# Patient Record
Sex: Female | Born: 1975 | Race: White | Hispanic: Yes | Marital: Single | Smoking: Former smoker
Health system: Southern US, Community
[De-identification: ages and names within clinical notes are randomized; demographics above are authoritative.]

## PROBLEM LIST (undated history)

## (undated) DIAGNOSIS — D649 Anemia, unspecified: Secondary | ICD-10-CM

## (undated) DIAGNOSIS — F32A Depression, unspecified: Secondary | ICD-10-CM

## (undated) HISTORY — DX: Depression, unspecified: F32.A

## (undated) HISTORY — PX: ABDOMINAL HYSTERECTOMY: SHX81

## (undated) HISTORY — DX: Anemia, unspecified: D64.9

## (undated) HISTORY — PX: TONSILLECTOMY: SUR1361

---

## 2002-12-12 ENCOUNTER — Inpatient Hospital Stay (HOSPITAL_COMMUNITY): Admission: AD | Admit: 2002-12-12 | Discharge: 2002-12-12 | Payer: Self-pay | Admitting: Obstetrics & Gynecology

## 2002-12-27 ENCOUNTER — Encounter: Admission: RE | Admit: 2002-12-27 | Discharge: 2002-12-27 | Payer: Self-pay | Admitting: *Deleted

## 2003-01-13 ENCOUNTER — Ambulatory Visit (HOSPITAL_COMMUNITY): Admission: RE | Admit: 2003-01-13 | Discharge: 2003-01-13 | Payer: Self-pay | Admitting: *Deleted

## 2003-04-15 ENCOUNTER — Inpatient Hospital Stay (HOSPITAL_COMMUNITY): Admission: AD | Admit: 2003-04-15 | Discharge: 2003-04-15 | Payer: Self-pay | Admitting: Obstetrics and Gynecology

## 2003-04-20 ENCOUNTER — Encounter: Admission: RE | Admit: 2003-04-20 | Discharge: 2003-04-20 | Payer: Self-pay | Admitting: *Deleted

## 2003-04-26 ENCOUNTER — Inpatient Hospital Stay (HOSPITAL_COMMUNITY): Admission: AD | Admit: 2003-04-26 | Discharge: 2003-04-28 | Payer: Self-pay | Admitting: *Deleted

## 2003-05-02 ENCOUNTER — Inpatient Hospital Stay (HOSPITAL_COMMUNITY): Admission: AD | Admit: 2003-05-02 | Discharge: 2003-05-02 | Payer: Self-pay | Admitting: Obstetrics and Gynecology

## 2003-11-27 ENCOUNTER — Ambulatory Visit (HOSPITAL_COMMUNITY): Admission: RE | Admit: 2003-11-27 | Discharge: 2003-11-27 | Payer: Self-pay | Admitting: *Deleted

## 2004-02-02 ENCOUNTER — Ambulatory Visit (HOSPITAL_COMMUNITY): Admission: RE | Admit: 2004-02-02 | Discharge: 2004-02-02 | Payer: Self-pay | Admitting: *Deleted

## 2004-06-21 ENCOUNTER — Ambulatory Visit: Payer: Self-pay | Admitting: *Deleted

## 2004-06-21 ENCOUNTER — Ambulatory Visit (HOSPITAL_COMMUNITY): Admission: RE | Admit: 2004-06-21 | Discharge: 2004-06-21 | Payer: Self-pay | Admitting: *Deleted

## 2004-06-26 ENCOUNTER — Ambulatory Visit: Payer: Self-pay | Admitting: *Deleted

## 2004-06-26 ENCOUNTER — Inpatient Hospital Stay (HOSPITAL_COMMUNITY): Admission: AD | Admit: 2004-06-26 | Discharge: 2004-06-28 | Payer: Self-pay | Admitting: *Deleted

## 2006-03-05 ENCOUNTER — Emergency Department (HOSPITAL_COMMUNITY): Admission: EM | Admit: 2006-03-05 | Discharge: 2006-03-06 | Payer: Self-pay | Admitting: Emergency Medicine

## 2006-06-27 ENCOUNTER — Emergency Department (HOSPITAL_COMMUNITY): Admission: EM | Admit: 2006-06-27 | Discharge: 2006-06-27 | Payer: Self-pay | Admitting: Family Medicine

## 2007-05-31 ENCOUNTER — Emergency Department (HOSPITAL_COMMUNITY): Admission: EM | Admit: 2007-05-31 | Discharge: 2007-05-31 | Payer: Self-pay | Admitting: Emergency Medicine

## 2007-07-23 ENCOUNTER — Emergency Department (HOSPITAL_COMMUNITY): Admission: EM | Admit: 2007-07-23 | Discharge: 2007-07-24 | Payer: Self-pay | Admitting: Emergency Medicine

## 2007-08-22 ENCOUNTER — Inpatient Hospital Stay (HOSPITAL_COMMUNITY): Admission: AD | Admit: 2007-08-22 | Discharge: 2007-08-22 | Payer: Self-pay | Admitting: Gynecology

## 2008-02-06 ENCOUNTER — Emergency Department (HOSPITAL_COMMUNITY): Admission: EM | Admit: 2008-02-06 | Discharge: 2008-02-06 | Payer: Self-pay | Admitting: Emergency Medicine

## 2008-05-26 ENCOUNTER — Emergency Department (HOSPITAL_BASED_OUTPATIENT_CLINIC_OR_DEPARTMENT_OTHER): Admission: EM | Admit: 2008-05-26 | Discharge: 2008-05-26 | Payer: Self-pay | Admitting: Emergency Medicine

## 2008-05-26 ENCOUNTER — Ambulatory Visit: Payer: Self-pay | Admitting: Diagnostic Radiology

## 2008-08-27 ENCOUNTER — Ambulatory Visit: Payer: Self-pay | Admitting: Advanced Practice Midwife

## 2008-08-27 ENCOUNTER — Inpatient Hospital Stay (HOSPITAL_COMMUNITY): Admission: AD | Admit: 2008-08-27 | Discharge: 2008-08-28 | Payer: Self-pay | Admitting: Obstetrics & Gynecology

## 2009-02-03 ENCOUNTER — Inpatient Hospital Stay (HOSPITAL_COMMUNITY): Admission: AD | Admit: 2009-02-03 | Discharge: 2009-02-03 | Payer: Self-pay | Admitting: Obstetrics & Gynecology

## 2009-02-03 ENCOUNTER — Ambulatory Visit: Payer: Self-pay | Admitting: Advanced Practice Midwife

## 2010-02-17 ENCOUNTER — Encounter: Payer: Self-pay | Admitting: *Deleted

## 2010-04-07 ENCOUNTER — Emergency Department (HOSPITAL_COMMUNITY)
Admission: EM | Admit: 2010-04-07 | Discharge: 2010-04-07 | Disposition: A | Discharge status: Home / Self Care | Payer: 59 | Attending: Emergency Medicine | Admitting: Emergency Medicine

## 2010-04-07 DIAGNOSIS — M546 Pain in thoracic spine: Secondary | ICD-10-CM | POA: Insufficient documentation

## 2010-04-07 DIAGNOSIS — Y929 Unspecified place or not applicable: Secondary | ICD-10-CM | POA: Insufficient documentation

## 2010-04-07 DIAGNOSIS — M542 Cervicalgia: Secondary | ICD-10-CM | POA: Insufficient documentation

## 2010-04-10 ENCOUNTER — Ambulatory Visit
Admission: RE | Admit: 2010-04-10 | Discharge: 2010-04-10 | Disposition: A | Discharge status: Home / Self Care | Payer: 59 | Source: Ambulatory Visit | Attending: Specialist | Admitting: Specialist

## 2010-04-10 ENCOUNTER — Other Ambulatory Visit: Payer: Self-pay | Admitting: Specialist

## 2010-04-14 LAB — GLUCOSE, CAPILLARY: Glucose-Capillary: 82 mg/dL (ref 70–99)

## 2010-05-04 LAB — URINE MICROSCOPIC-ADD ON

## 2010-05-04 LAB — URINALYSIS, ROUTINE W REFLEX MICROSCOPIC
Bilirubin Urine: NEGATIVE
Glucose, UA: NEGATIVE mg/dL
Hgb urine dipstick: NEGATIVE
Ketones, ur: NEGATIVE mg/dL
Nitrite: NEGATIVE
Protein, ur: NEGATIVE mg/dL
Specific Gravity, Urine: 1.02 (ref 1.005–1.030)
Urobilinogen, UA: 0.2 mg/dL (ref 0.0–1.0)
pH: 5 (ref 5.0–8.0)

## 2010-05-04 LAB — POCT PREGNANCY, URINE: Preg Test, Ur: POSITIVE

## 2010-05-04 LAB — WET PREP, GENITAL
Trich, Wet Prep: NONE SEEN
Yeast Wet Prep HPF POC: NONE SEEN

## 2010-05-04 LAB — GC/CHLAMYDIA PROBE AMP, GENITAL
Chlamydia, DNA Probe: NEGATIVE
GC Probe Amp, Genital: NEGATIVE

## 2010-05-04 LAB — HCG, QUANTITATIVE, PREGNANCY: hCG, Beta Chain, Quant, S: 21830 m[IU]/mL — ABNORMAL HIGH (ref <5)

## 2010-05-08 LAB — CBC
HCT: 33.1 % — ABNORMAL LOW (ref 36.0–46.0)
Hemoglobin: 11.1 g/dL — ABNORMAL LOW (ref 12.0–15.0)
MCHC: 33.5 g/dL (ref 30.0–36.0)
MCV: 79.2 fL (ref 78.0–100.0)
Platelets: 191 10*3/uL (ref 150–400)
RBC: 4.18 MIL/uL (ref 3.87–5.11)
RDW: 15 % (ref 11.5–15.5)
WBC: 6.8 10*3/uL (ref 4.0–10.5)

## 2010-05-08 LAB — BASIC METABOLIC PANEL
BUN: 13 mg/dL (ref 6–23)
CO2: 26 mEq/L (ref 19–32)
Calcium: 8.6 mg/dL (ref 8.4–10.5)
Chloride: 103 mEq/L (ref 96–112)
Creatinine, Ser: 0.7 mg/dL (ref 0.4–1.2)
GFR calc Af Amer: >60 mL/min (ref >60)
GFR calc non Af Amer: >60 mL/min (ref >60)
Glucose, Bld: 87 mg/dL (ref 70–99)
Potassium: 3.9 mEq/L (ref 3.5–5.1)
Sodium: 137 mEq/L (ref 135–145)

## 2010-05-08 LAB — URINALYSIS, ROUTINE W REFLEX MICROSCOPIC
Bilirubin Urine: NEGATIVE
Glucose, UA: NEGATIVE mg/dL
Hgb urine dipstick: NEGATIVE
Ketones, ur: NEGATIVE mg/dL
Nitrite: NEGATIVE
Protein, ur: NEGATIVE mg/dL
Specific Gravity, Urine: 1.014 (ref 1.005–1.030)
Urobilinogen, UA: 0.2 mg/dL (ref 0.0–1.0)
pH: 6.5 (ref 5.0–8.0)

## 2010-05-08 LAB — URINE MICROSCOPIC-ADD ON

## 2010-05-08 LAB — DIFFERENTIAL
Basophils Absolute: 0 10*3/uL (ref 0.0–0.1)
Basophils Relative: 0 % (ref 0–1)
Eosinophils Absolute: 0 10*3/uL (ref 0.0–0.7)
Eosinophils Relative: 1 % (ref 0–5)
Neutrophils Relative %: 75 % (ref 43–77)

## 2010-05-08 LAB — PREGNANCY, URINE: Preg Test, Ur: NEGATIVE

## 2010-05-13 LAB — GLUCOSE, CAPILLARY: Glucose-Capillary: 81 mg/dL (ref 70–99)

## 2010-10-24 LAB — WET PREP, GENITAL
Trich, Wet Prep: NONE SEEN
Yeast Wet Prep HPF POC: NONE SEEN

## 2010-10-24 LAB — URINALYSIS, ROUTINE W REFLEX MICROSCOPIC
Bilirubin Urine: NEGATIVE
Hgb urine dipstick: NEGATIVE
Ketones, ur: NEGATIVE
Nitrite: NEGATIVE
Protein, ur: NEGATIVE
Urobilinogen, UA: 1

## 2010-10-24 LAB — GC/CHLAMYDIA PROBE AMP, GENITAL: GC Probe Amp, Genital: NEGATIVE

## 2010-10-24 LAB — URINE MICROSCOPIC-ADD ON

## 2010-10-25 LAB — URINALYSIS, ROUTINE W REFLEX MICROSCOPIC
Glucose, UA: NEGATIVE
Hgb urine dipstick: NEGATIVE
Ketones, ur: NEGATIVE
pH: 5

## 2010-10-25 LAB — WET PREP, GENITAL
Clue Cells Wet Prep HPF POC: NONE SEEN
Trich, Wet Prep: NONE SEEN
Yeast Wet Prep HPF POC: NONE SEEN

## 2016-12-11 ENCOUNTER — Other Ambulatory Visit: Payer: Self-pay | Admitting: Family Medicine

## 2016-12-11 DIAGNOSIS — Z1231 Encounter for screening mammogram for malignant neoplasm of breast: Secondary | ICD-10-CM

## 2017-02-12 ENCOUNTER — Ambulatory Visit
Admission: RE | Admit: 2017-02-12 | Discharge: 2017-02-12 | Disposition: A | Discharge status: Home / Self Care | Payer: Medicaid Other | Source: Ambulatory Visit | Attending: Family Medicine | Admitting: Family Medicine

## 2017-02-12 DIAGNOSIS — Z1231 Encounter for screening mammogram for malignant neoplasm of breast: Secondary | ICD-10-CM | POA: Insufficient documentation

## 2019-10-12 ENCOUNTER — Other Ambulatory Visit: Payer: Self-pay | Admitting: Family Medicine

## 2019-10-12 DIAGNOSIS — Z1231 Encounter for screening mammogram for malignant neoplasm of breast: Secondary | ICD-10-CM

## 2019-10-28 ENCOUNTER — Other Ambulatory Visit: Payer: Self-pay

## 2019-10-28 DIAGNOSIS — K644 Residual hemorrhoidal skin tags: Secondary | ICD-10-CM | POA: Insufficient documentation

## 2019-10-28 DIAGNOSIS — K625 Hemorrhage of anus and rectum: Secondary | ICD-10-CM | POA: Insufficient documentation

## 2019-10-28 DIAGNOSIS — D508 Other iron deficiency anemias: Secondary | ICD-10-CM | POA: Insufficient documentation

## 2019-10-28 LAB — CBC
HCT: 28.1 % — ABNORMAL LOW (ref 36.0–46.0)
Hemoglobin: 9 g/dL — ABNORMAL LOW (ref 12.0–15.0)
MCH: 21.4 pg — ABNORMAL LOW (ref 26.0–34.0)
MCHC: 32 g/dL (ref 30.0–36.0)
MCV: 66.7 fL — ABNORMAL LOW (ref 80.0–100.0)
Platelets: 405 10*3/uL — ABNORMAL HIGH (ref 150–400)
RBC: 4.21 MIL/uL (ref 3.87–5.11)
RDW: 21.2 % — ABNORMAL HIGH (ref 11.5–15.5)
WBC: 8.4 10*3/uL (ref 4.0–10.5)
nRBC: 0 % (ref 0.0–0.2)

## 2019-10-28 LAB — COMPREHENSIVE METABOLIC PANEL
ALT: 12 U/L (ref 0–44)
AST: 16 U/L (ref 15–41)
Albumin: 3.8 g/dL (ref 3.5–5.0)
Alkaline Phosphatase: 60 U/L (ref 38–126)
Anion gap: 7 (ref 5–15)
BUN: 16 mg/dL (ref 6–20)
CO2: 26 mmol/L (ref 22–32)
Calcium: 9.1 mg/dL (ref 8.9–10.3)
Chloride: 106 mmol/L (ref 98–111)
Creatinine, Ser: 0.75 mg/dL (ref 0.44–1.00)
GFR calc Af Amer: >60 mL/min (ref >60)
GFR calc non Af Amer: >60 mL/min (ref >60)
Glucose, Bld: 102 mg/dL — ABNORMAL HIGH (ref 70–99)
Potassium: 4.1 mmol/L (ref 3.5–5.1)
Sodium: 139 mmol/L (ref 135–145)
Total Bilirubin: 0.4 mg/dL (ref 0.3–1.2)
Total Protein: 7.5 g/dL (ref 6.5–8.1)

## 2019-10-28 MED ORDER — IOHEXOL 9 MG/ML PO SOLN: 500.0000 mL | ORAL | Status: AC

## 2019-10-28 NOTE — ED Triage Notes (Signed)
Pt states bright red blood when she wipes on tissue after bowel movement. Pt states she also is having rectal pain, pain "so intense it hurts to sit". Pt is not sure if she has hemmorrhoid history. Pt is currently on unknown antibiotic for URI. Pt denies dark tarry stools or noted blood in stool.

## 2019-10-28 NOTE — ED Notes (Signed)
Positive antibody in blood  Probably delay in blood

## 2019-10-29 ENCOUNTER — Emergency Department
Admission: EM | Admit: 2019-10-29 | Discharge: 2019-10-29 | Disposition: A | Discharge status: Home / Self Care | Payer: Medicaid Other | Attending: Emergency Medicine | Admitting: Emergency Medicine

## 2019-10-29 DIAGNOSIS — K625 Hemorrhage of anus and rectum: Secondary | ICD-10-CM

## 2019-10-29 DIAGNOSIS — D508 Other iron deficiency anemias: Secondary | ICD-10-CM

## 2019-10-29 DIAGNOSIS — K644 Residual hemorrhoidal skin tags: Secondary | ICD-10-CM

## 2019-10-29 LAB — BPAM RBC
Blood Product Expiration Date: 202110142359
Blood Product Expiration Date: 202110202359
Unit Type and Rh: 5100
Unit Type and Rh: 5100

## 2019-10-29 LAB — TYPE AND SCREEN
ABO/RH(D): B POS
Antibody Screen: POSITIVE
Unit division: 0
Unit division: 0

## 2019-10-29 LAB — ABO/RH: ABO/RH(D): B POS

## 2019-10-29 MED ORDER — NITROGLYCERIN 2 % TD OINT: 0.5000 [in_us] | TOPICAL_OINTMENT | Freq: Four times a day (QID) | TRANSDERMAL | 3 refills | Status: DC

## 2019-10-29 NOTE — ED Provider Notes (Signed)
[facility] Emergency Department Provider Note ____________________________________________   None    (approximate)  I have reviewed the triage vital signs and the nursing notes.  HISTORY  Chief Complaint Rectal Bleeding   HPI Danielle [name] is a 44 y.o. femalewho presents to the ED for evaluation of rectal bleeding.   Chart review indicates patient had an outpatient CT scan of abdomen/pelvis with IV contrast performed 3 days ago due to chronic epigastric pain.  Hepatobiliary tree was unremarkable, diverticulosis without diverticulitis present.  CT without evidence of acute intra-abdominal pathology. Patient self-reports a history of iron deficiency anemia, previously prescribed iron supplementation.  She reports not seeing her PCP for multiple years, due to the COVID-19 pandemic.  She reports recently restarting iron supplementation in the past 2 weeks.   Patient presents to the ED with 5 days of rectal pain and bright red blood when wiping her bottom with toilet paper.  Patient reports 8/10 intensity, constant and aching/sharp pain to her anus that has been present constantly for the past 5 days, this pain is exacerbated/worsened when seated or passing a bowel movement.  She reports taken Tylenol at home without improvement of her pain.  She denies history of hemorrhoids.  Denies constipation, but reports hesitance to void or pass stool due to the pain that she feels when seated.  She denies any worsening of her chronic frontal abdominal pain that is been present for multiple months.  Denies dysuria, fevers, syncope, chest pain, shortness of breath.  She does report feeling presyncopal dizziness while standing that self resolves without syncope.  Denies vaginal bleeding.  Denies hematemesis, melena, coffee-ground emesis or hematochezia.  She reports her stool is brown in color without blood, and she only sees blood when she wipes her bottom with toilet paper.     No past medical history on file.  There are no problems to display for this patient.   No past surgical history on file.  Prior to Admission medications   Medication Sig Start Date End Date Taking? Authorizing Provider  nitroGLYCERIN (NITROGLYN) 2 % ointment Apply 0.5 inches topically every 6 (six) hours. 10/29/19 10/28/20  Delton Prairie, MD    Allergies Patient has no known allergies.  Family History  Problem Relation Age of Onset  . Breast cancer Paternal Aunt     Social History Social History   Tobacco Use  . Smoking status: Not on file  Substance Use Topics  . Alcohol use: Not on file  . Drug use: Not on file    Review of Systems  Constitutional: No fever/chills Eyes: No visual changes. ENT: No sore throat. Cardiovascular: Denies chest pain. Respiratory: Denies shortness of breath. Gastrointestinal: No abdominal pain.  No nausea, no vomiting.  No diarrhea.  No constipation. Positive for rectal pain and bleeding. Genitourinary: Negative for dysuria. Musculoskeletal: Negative for back pain. Skin: Negative for rash. Neurological: Negative for headaches, focal weakness or numbness.   ____________________________________________   PHYSICAL EXAM:  VITAL SIGNS: Vitals:   10/28/19 2206 10/29/19 0800  BP: 124/74 122/83  Pulse: 79 70  Resp: 18   Temp: 98.2 F (36.8 C)   SpO2: 99% 99%      Constitutional: Alert and oriented. Well appearing and in no acute distress.  Obese.  Sitting on her right hip.  Pleasant and conversational full sentences.  No distress. Eyes: Conjunctivae are normal. PERRL. EOMI. Head: Atraumatic. Nose: No congestion/rhinnorhea. Mouth/Throat: Mucous membranes are moist.  Oropharynx non-erythematous. Neck: No stridor. No  cervical spine tenderness to palpation. Cardiovascular: Normal rate, regular rhythm. Grossly normal heart sounds.  Good peripheral circulation. Respiratory: Normal respiratory effort.  No retractions. Lungs  CTAB. Gastrointestinal: Soft , nondistended, nontender to palpation. No abdominal bruits. No CVA tenderness.  Frontal abdomen is benign and soft.  No epigastric tenderness to palpation. Chaperoned GU exam demonstrates anteriorly oriented external hemorrhoid without evidence of thrombosis.  No active bleeding.  DRE causes discomfort.  No bright red blood or melena.  No evidence of impaction. Musculoskeletal: No lower extremity tenderness nor edema.  No joint effusions. No signs of acute trauma. Neurologic:  Normal speech and language. No gross focal neurologic deficits are appreciated. No gait instability noted. Skin:  Skin is warm, dry and intact. No rash noted. Psychiatric: Mood and affect are normal. Speech and behavior are normal.  ____________________________________________   LABS (all labs ordered are listed, but only abnormal results are displayed)  Labs Reviewed  COMPREHENSIVE METABOLIC PANEL - Abnormal; Notable for the following components:      Result Value   Glucose, Bld 102 (*)    All other components within normal limits  CBC - Abnormal; Notable for the following components:   Hemoglobin 9.0 (*)    HCT 28.1 (*)    MCV 66.7 (*)    MCH 21.4 (*)    RDW 21.2 (*)    Platelets 405 (*)    All other components within normal limits  POC URINE PREG, ED  TYPE AND SCREEN  ABO/RH   ____________________________________________   MDM / ED COURSE  44 year old woman with history of iron deficiency anemia presents to ED with evidence of an external hemorrhoid, amenable to outpatient management with PCP follow-up.  Normal vital signs on room air.  Exam demonstrates a well-appearing obese patient who prefers to sit on her side due to anal pain while seated on her bottom.  She has a benign abdomen and looks well without distress.  On GU examination, patient has an external hemorrhoid as the likely source of her pain.  She has no melanotic stool, hematemesis, coffee-ground emesis or  evidence of upper or lower GI bleeding beyond the hemorrhoid.  She has no tachycardia or evidence of acute blood loss anemia.  Her blood work demonstrates microcytic anemia with hemoglobin of 9, with no comparison within the past 11 years in our system.  She has no signs or symptoms of acute blood loss and this is likely chronic in nature, and she certainly has no indications for transfusion.  I strongly recommended her continued iron supplementation and following up with her PCP to discuss hematology referral.  We discussed outpatient management of her external hemorrhoids, to include topical nitroglycerin, topical Preparation H or its generic equivalents, relieving pressure with a donut pillow.  We discussed return precautions for the ED.  I urged PCP follow-up within the next 1 week.  Patient medically stable for discharge home.   ____________________________________________   FINAL CLINICAL IMPRESSION(S) / ED DIAGNOSES  Final diagnoses:  Rectal bleeding  External hemorrhoid, bleeding  Other iron deficiency anemia     ED Discharge Orders         Ordered    nitroGLYCERIN (NITROGLYN) 2 % ointment  Every 6 hours        10/29/19 0808           Delton Prairie   Note:  This document was prepared using Dragon voice recognition software and may include unintentional dictation errors.   Delton Prairie, MD 10/29/19 [phone]

## 2019-10-29 NOTE — ED Notes (Signed)
Patient resting quietly, no acute distress noted. °

## 2019-10-29 NOTE — Discharge Instructions (Addendum)
You were seen in the ED because of your rectal bleeding and pain. You have evidence of external hemorrhoids that are likely causing the bleeding and pain.   You have been discharged with a prescription for nitroglycerin ointment/cream, this is a medication to apply directly to the bump on your bottom that is painful.  The medicine will help reduce swelling and improve your pain.  I would also recommend you pick up over-the-counter Preparation H, or its generic equivalent, either the tube of the medicine or the wipes/pads impregnated with the medication.  Would also recommend that you buy a doughnut pillow to help relieve pressure while seated.   In general, keep the area clean and dry.  If you feel a sensation that you need to wipe your bottom, please gently do so.  Once clean and dry, apply the Preparation H medication.  If you develop any significantly worsening bleeding, passage of clots from your bottom, worsening frontal abdominal pain or fevers, please return to the ED.  Also recommend that you continue to take your iron/blood supplementation medication due to your iron deficiency anemia.  Please follow-up with your primary care physician within the next 1 week to discuss this ED visit and your continued anemia.

## 2019-11-28 ENCOUNTER — Inpatient Hospital Stay: Payer: Medicaid Other

## 2019-11-28 ENCOUNTER — Encounter: Payer: Self-pay | Admitting: Internal Medicine

## 2019-11-28 ENCOUNTER — Encounter (INDEPENDENT_AMBULATORY_CARE_PROVIDER_SITE_OTHER): Payer: Self-pay

## 2019-11-28 ENCOUNTER — Inpatient Hospital Stay: Payer: Medicaid Other | Attending: Internal Medicine | Admitting: Internal Medicine

## 2019-11-28 ENCOUNTER — Other Ambulatory Visit: Payer: Self-pay

## 2019-11-28 DIAGNOSIS — D509 Iron deficiency anemia, unspecified: Secondary | ICD-10-CM | POA: Insufficient documentation

## 2019-11-28 DIAGNOSIS — E611 Iron deficiency: Secondary | ICD-10-CM | POA: Insufficient documentation

## 2019-11-28 DIAGNOSIS — F329 Major depressive disorder, single episode, unspecified: Secondary | ICD-10-CM

## 2019-11-28 DIAGNOSIS — F17210 Nicotine dependence, cigarettes, uncomplicated: Secondary | ICD-10-CM | POA: Insufficient documentation

## 2019-11-28 DIAGNOSIS — D649 Anemia, unspecified: Secondary | ICD-10-CM | POA: Insufficient documentation

## 2019-11-28 LAB — CBC WITH DIFFERENTIAL/PLATELET
Abs Immature Granulocytes: 0.09 10*3/uL — ABNORMAL HIGH (ref 0.00–0.07)
Basophils Absolute: 0 10*3/uL (ref 0.0–0.1)
Basophils Relative: 0 %
Eosinophils Absolute: 0.1 10*3/uL (ref 0.0–0.5)
Eosinophils Relative: 1 %
HCT: 32.5 % — ABNORMAL LOW (ref 36.0–46.0)
Hemoglobin: 10.1 g/dL — ABNORMAL LOW (ref 12.0–15.0)
Immature Granulocytes: 1 %
Lymphocytes Relative: 31 %
Lymphs Abs: 2.2 10*3/uL (ref 0.7–4.0)
MCH: 21.4 pg — ABNORMAL LOW (ref 26.0–34.0)
MCHC: 31.1 g/dL (ref 30.0–36.0)
MCV: 68.7 fL — ABNORMAL LOW (ref 80.0–100.0)
Monocytes Absolute: 0.6 10*3/uL (ref 0.1–1.0)
Monocytes Relative: 8 %
Neutro Abs: 4.1 10*3/uL (ref 1.7–7.7)
Neutrophils Relative %: 59 %
Platelets: 360 10*3/uL (ref 150–400)
RBC: 4.73 MIL/uL (ref 3.87–5.11)
RDW: 20.9 % — ABNORMAL HIGH (ref 11.5–15.5)
WBC: 7.1 10*3/uL (ref 4.0–10.5)
nRBC: 0 % (ref 0.0–0.2)

## 2019-11-28 LAB — COMPREHENSIVE METABOLIC PANEL
ALT: 14 U/L (ref 0–44)
AST: 18 U/L (ref 15–41)
Albumin: 4.2 g/dL (ref 3.5–5.0)
Alkaline Phosphatase: 72 U/L (ref 38–126)
Anion gap: 7 (ref 5–15)
BUN: 11 mg/dL (ref 6–20)
CO2: 25 mmol/L (ref 22–32)
Calcium: 9.3 mg/dL (ref 8.9–10.3)
Chloride: 106 mmol/L (ref 98–111)
Creatinine, Ser: 0.56 mg/dL (ref 0.44–1.00)
GFR, Estimated: >60 mL/min (ref >60)
Glucose, Bld: 104 mg/dL — ABNORMAL HIGH (ref 70–99)
Potassium: 3.8 mmol/L (ref 3.5–5.1)
Sodium: 138 mmol/L (ref 135–145)
Total Bilirubin: 0.5 mg/dL (ref 0.3–1.2)
Total Protein: 8.5 g/dL — ABNORMAL HIGH (ref 6.5–8.1)

## 2019-11-28 LAB — RETICULOCYTES
Immature Retic Fract: 24.9 % — ABNORMAL HIGH (ref 2.3–15.9)
RBC.: 4.73 MIL/uL (ref 3.87–5.11)
Retic Count, Absolute: 61 10*3/uL (ref 19.0–186.0)
Retic Ct Pct: 1.3 % (ref 0.4–3.1)

## 2019-11-28 LAB — IRON AND TIBC
Iron: 25 ug/dL — ABNORMAL LOW (ref 28–170)
Saturation Ratios: 5 % — ABNORMAL LOW (ref 10.4–31.8)
TIBC: 540 ug/dL — ABNORMAL HIGH (ref 250–450)
UIBC: 515 ug/dL

## 2019-11-28 LAB — LACTATE DEHYDROGENASE: LDH: 115 U/L (ref 98–192)

## 2019-11-28 LAB — FERRITIN: Ferritin: 5 ng/mL — ABNORMAL LOW (ref 11–307)

## 2019-11-28 NOTE — Progress Notes (Signed)
[facility] CONSULT NOTE  Patient Care Team: Pcp, No as PCP - General  CHIEF COMPLAINTS/PURPOSE OF CONSULTATION: ANEMIA  HEMATOLOGY HISTORY  # IRON DEFICIENCY ANEMIA CHRONIC [since 2019] AUG 2021- hb-9; MCV- 63; Iron sat- 3%; EGD > 7 years ago [[place]]; colonoscopy/capsule-NA  #History of heavy menstrual cycles; however-LMP-June2021  HISTORY OF PRESENTING ILLNESS:  Danielle [name] 44 y.o.  female has been referred to [place] for further evaluation/work-up for anemia.  Patient complains of worsening fatigue over the last many months.  Complains of myalgias.  Complains of dizzy spells.  Complains of tingling and numbness around her mouth and also in the extremities.  Blood in stools: None Change in bowel habits- None Blood in urine: None Difficulty swallowing: None Abnormal weight loss: None Iron supplementation: on PO iron supplementation.  Prior Blood transfusions: None Vaginal bleeding: History of heavy menstrual cycles however last menstrual cycle- June 2021.  PICA- ice  Review of Systems  Constitutional: Positive for malaise/fatigue. Negative for chills, diaphoresis, fever and weight loss.  HENT: Negative for nosebleeds and sore throat.   Eyes: Negative for double vision.  Respiratory: Negative for cough, hemoptysis, sputum production, shortness of breath and wheezing.   Cardiovascular: Negative for chest pain, palpitations, orthopnea and leg swelling.  Gastrointestinal: Positive for nausea and vomiting. Negative for abdominal pain, blood in stool, constipation, diarrhea, heartburn and melena.  Genitourinary: Negative for dysuria, frequency and urgency.  Musculoskeletal: Positive for myalgias. Negative for back pain and joint pain.  Skin: Negative.  Negative for itching and rash.  Neurological: Positive for dizziness and tingling. Negative for focal weakness, weakness and headaches.  Endo/Heme/Allergies: Does not bruise/bleed easily.  Psychiatric/Behavioral: Negative  for depression. The patient has insomnia. The patient is not nervous/anxious.     MEDICAL HISTORY:  Past Medical History:  Diagnosis Date  . Anemia   . Depression     SURGICAL HISTORY: Past Surgical History:  Procedure Laterality Date  . ABDOMINAL HYSTERECTOMY    . TONSILLECTOMY      SOCIAL HISTORY: Social History   Socioeconomic History  . Marital status: Single    Spouse name: Not on file  . Number of children: Not on file  . Years of education: Not on file  . Highest education level: Not on file  Occupational History  . Not on file  Tobacco Use  . Smoking status: Current Every Day Smoker  . Smokeless tobacco: Never Used  Substance and Sexual Activity  . Alcohol use: Not Currently  . Drug use: Never  . Sexual activity: Not on file  Other Topics Concern  . Not on file  Social History Narrative   Lives in [place] with 4 kids; stay at home [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    Social Determinants of Health   Financial Resource Strain:   . Difficulty of Paying Living Expenses: Not on file  Food Insecurity:   . Worried About Programme researcher, broadcasting/film/video in the Last Year: Not on file  . Ran Out of Food in the Last Year: Not on file  Transportation Needs:   . Lack of Transportation (Medical): Not on file  . Lack of Transportation (Non-Medical): Not on file  Physical Activity:   . Days of Exercise per Week: Not on file  . Minutes of Exercise per Session: Not on file  Stress:   . Feeling of Stress : Not on file  Social Connections:   . Frequency of Communication with Friends and Family: Not on file  .  Frequency of Social Gatherings with Friends and Family: Not on file  . Attends Religious Services: Not on file  . Active Member of Clubs or Organizations: Not on file  . Attends Banker Meetings: Not on file  . Marital Status: Not on file  Intimate Partner Violence:   . Fear of Current or Ex-Partner: Not on file  . Emotionally Abused: Not on  file  . Physically Abused: Not on file  . Sexually Abused: Not on file    FAMILY HISTORY: Family History  Problem Relation Age of Onset  . Breast cancer Paternal Aunt     ALLERGIES:  has No Known Allergies.  MEDICATIONS:  Current Outpatient Medications  Medication Sig Dispense Refill  . Ferrous Sulfate (IRON SUPPLEMENT PO) Take by mouth. megafood blood builder OTC    . ondansetron (ZOFRAN) 4 MG tablet Take 4 mg by mouth every 4 (four) hours as needed.    . pantoprazole (PROTONIX) 40 MG tablet Take 40 mg by mouth daily.    Marland Kitchen PARoxetine (PAXIL) 10 MG tablet Take 10 mg by mouth daily.    . sucralfate (CARAFATE) 1 g tablet Take 1 g by mouth 4 (four) times daily.     No current facility-administered medications for this visit.      PHYSICAL EXAMINATION:   Vitals:   11/28/19 1410  BP: 112/79  Pulse: 76  Resp: 16  Temp: (!) 97 F (36.1 C)  SpO2: 100%   Filed Weights   11/28/19 1410  Weight: 207 lb 12.8 oz (94.3 kg)    Physical Exam HENT:     Head: Normocephalic and atraumatic.     Mouth/Throat:     Pharynx: No oropharyngeal exudate.  Eyes:     Pupils: Pupils are equal, round, and reactive to light.  Cardiovascular:     Rate and Rhythm: Normal rate and regular rhythm.  Pulmonary:     Effort: Pulmonary effort is normal. No respiratory distress.     Breath sounds: Normal breath sounds. No wheezing.  Abdominal:     General: Bowel sounds are normal. There is no distension.     Palpations: Abdomen is soft. There is no mass.     Tenderness: There is no abdominal tenderness. There is no guarding or rebound.  Musculoskeletal:        General: No tenderness. Normal range of motion.     Cervical back: Normal range of motion and neck supple.  Skin:    General: Skin is warm.  Neurological:     Mental Status: She is alert and oriented to person, place, and time.  Psychiatric:        Mood and Affect: Affect normal.     LABORATORY DATA:  I have reviewed the data as  listed Lab Results  Component Value Date   WBC 8.4 10/28/2019   HGB 9.0 (L) 10/28/2019   HCT 28.1 (L) 10/28/2019   MCV 66.7 (L) 10/28/2019   PLT 405 (H) 10/28/2019   Recent Labs    10/28/19 2213  NA 139  K 4.1  CL 106  CO2 26  GLUCOSE 102*  BUN 16  CREATININE 0.75  CALCIUM 9.1  GFRNONAA >60  GFRAA >60  PROT 7.5  ALBUMIN 3.8  AST 16  ALT 12  ALKPHOS 60  BILITOT 0.4     No results found.  Iron deficiency #Symptomatic iron deficient anemia-hemoglobin 9.1 MCV 66.  Iron studies-August 2021 saturation 3% [PCP].   # Discussed the potential acute infusion reactions  with IV iron; which are quite rare.  Patient understands the risk; will proceed with infusions.  #Etiology of iron deficiency-unclear.  Prior history of heavy menstrual cycles however LMP June 2021.  Given ongoing abdominal discomfort nausea patient awaiting GI evaluation.  Patient would benefit from a repeat upper endoscopy/colonoscopy.  #Active smoker: Counseled the patient to quit smoking.  Patient understands potential adverse events related to smoking.  Thank you, Ms.Lindley NP; for allowing me to participate in the care of your pleasant patient. Please do not hesitate to contact me with questions or concerns in the interim.  # DISPOSITION: # labs today-CBC iron studies ferritin LDH haptoglobin. # Venofer weekly x3; start this week # follow up in 1st week of dec; MD; labs- cbc/venofer-Dr.B    All questions were answered. The patient knows to call the clinic with any problems, questions or concerns.      Earna Coder, MD 11/28/2019 3:10 PM

## 2019-11-28 NOTE — Assessment & Plan Note (Addendum)
#  Symptomatic iron deficient anemia-hemoglobin 9.1 MCV 66.  Iron studies-August 2021 saturation 3% [PCP].   # Discussed the potential acute infusion reactions with IV iron; which are quite rare.  Patient understands the risk; will proceed with infusions.  #Etiology of iron deficiency-unclear.  Prior history of heavy menstrual cycles however LMP June 2021.  Given ongoing abdominal discomfort nausea patient awaiting GI evaluation.  Patient would benefit from a repeat upper endoscopy/colonoscopy.  #Active smoker: Counseled the patient to quit smoking.  Patient understands potential adverse events related to smoking.  Thank you, Ms.Lindley NP; for allowing me to participate in the care of your pleasant patient. Please do not hesitate to contact me with questions or concerns in the interim.  # DISPOSITION: # labs today-CBC iron studies ferritin LDH haptoglobin. # Venofer weekly x3; start this week # follow up in 1st week of dec; MD; labs- cbc/venofer-Dr.B  Addendum: on 11/02-left voicemail for the patient regarding low iron levels need for Venofer as discussed earlier in the clinic.  However on 11/03-as per office staff patient insurance out of network.  Patient be contacted regarding future hematology care/referral.

## 2019-11-28 NOTE — Addendum Note (Signed)
Addended by: Mercer Pod E on: 11/28/2019 03:50 PM   Modules accepted: Orders

## 2019-11-28 NOTE — Progress Notes (Signed)
Pt states she has episodes of passing out that has been going on for a little bit over a month. States that it is random. Has severe nausea and vomiting as well that she is following up with GI about.

## 2019-11-29 LAB — HAPTOGLOBIN: Haptoglobin: 190 mg/dL (ref 42–296)

## 2019-12-01 ENCOUNTER — Telehealth: Payer: Self-pay | Admitting: Internal Medicine

## 2019-12-01 NOTE — Telephone Encounter (Signed)
Tried to call patient at [phone] without success.  Was able to reach her daughter Konrad Penta and requested a call back from the patient.  Patient Danielle [name] called back and I explained that she is enrolled in a [place] Medicaid Complete plan that is out of network.  I suggested that she reach out to her case worker to see if she could enroll in any of the plans that are in network [employer], [employer] or [employer] so that the infusions ordered by Dr. Donneta Romberg would be covered.  Patient expressed understanding and is going to reach out to the case worker to see if this is possible.  I left my number as her call back to further discuss.  [facility]

## 2019-12-08 ENCOUNTER — Telehealth: Payer: Self-pay | Admitting: Internal Medicine

## 2019-12-08 ENCOUNTER — Inpatient Hospital Stay: Payer: Medicaid Other

## 2019-12-08 NOTE — Telephone Encounter (Signed)
Spoke with patient and she has not heard back from her case worker.  Canceling venofer appointment for today because patient does not have resources to pay out of network/ out of pocket expenses. She is going to call as soon as she hears from the case worker, I also said I would call her back on the 16th if I have not heard from her.  Team made aware via secure chat.

## 2019-12-15 ENCOUNTER — Inpatient Hospital Stay: Payer: Medicaid Other

## 2019-12-15 ENCOUNTER — Telehealth: Payer: Self-pay

## 2019-12-15 NOTE — Telephone Encounter (Signed)
I spoke with the patient she does not have insurance issues resolved and will not be coming in for appt. today. Message sent to [facility], scheduling and infusion nurse to inform.

## 2019-12-15 NOTE — Telephone Encounter (Signed)
Danielle [name] is reaching out to her manager to see how we can proceed with the insurance issue patient has been having with Medicaid.

## 2019-12-19 ENCOUNTER — Inpatient Hospital Stay: Payer: Medicaid Other

## 2020-01-03 ENCOUNTER — Other Ambulatory Visit: Payer: Medicaid Other

## 2020-01-03 ENCOUNTER — Ambulatory Visit: Payer: Medicaid Other

## 2020-01-03 ENCOUNTER — Ambulatory Visit: Payer: Medicaid Other | Admitting: Internal Medicine

## 2020-01-11 ENCOUNTER — Inpatient Hospital Stay: Payer: Medicaid Other | Admitting: Internal Medicine

## 2020-01-11 ENCOUNTER — Inpatient Hospital Stay: Payer: Medicaid Other

## 2020-01-11 ENCOUNTER — Other Ambulatory Visit: Payer: Self-pay | Admitting: *Deleted

## 2020-01-11 DIAGNOSIS — E611 Iron deficiency: Secondary | ICD-10-CM

## 2020-01-26 ENCOUNTER — Encounter: Payer: Self-pay | Admitting: Emergency Medicine

## 2020-01-26 ENCOUNTER — Other Ambulatory Visit: Payer: Self-pay

## 2020-01-26 DIAGNOSIS — R110 Nausea: Secondary | ICD-10-CM | POA: Insufficient documentation

## 2020-01-26 DIAGNOSIS — R1031 Right lower quadrant pain: Secondary | ICD-10-CM | POA: Diagnosis present

## 2020-01-26 DIAGNOSIS — Z5321 Procedure and treatment not carried out due to patient leaving prior to being seen by health care provider: Secondary | ICD-10-CM | POA: Diagnosis not present

## 2020-01-26 DIAGNOSIS — R1032 Left lower quadrant pain: Secondary | ICD-10-CM | POA: Insufficient documentation

## 2020-01-26 LAB — COMPREHENSIVE METABOLIC PANEL
ALT: 14 U/L (ref 0–44)
AST: 20 U/L (ref 15–41)
Albumin: 3.6 g/dL (ref 3.5–5.0)
Alkaline Phosphatase: 58 U/L (ref 38–126)
Anion gap: 10 (ref 5–15)
BUN: 17 mg/dL (ref 6–20)
CO2: 25 mmol/L (ref 22–32)
Calcium: 8.9 mg/dL (ref 8.9–10.3)
Chloride: 104 mmol/L (ref 98–111)
Creatinine, Ser: 0.71 mg/dL (ref 0.44–1.00)
GFR, Estimated: >60 mL/min (ref >60)
Glucose, Bld: 98 mg/dL (ref 70–99)
Potassium: 4.3 mmol/L (ref 3.5–5.1)
Sodium: 139 mmol/L (ref 135–145)
Total Bilirubin: 0.4 mg/dL (ref 0.3–1.2)
Total Protein: 7.2 g/dL (ref 6.5–8.1)

## 2020-01-26 LAB — CBC
HCT: 29.5 % — ABNORMAL LOW (ref 36.0–46.0)
Hemoglobin: 9.5 g/dL — ABNORMAL LOW (ref 12.0–15.0)
MCH: 22.1 pg — ABNORMAL LOW (ref 26.0–34.0)
MCHC: 32.2 g/dL (ref 30.0–36.0)
MCV: 68.8 fL — ABNORMAL LOW (ref 80.0–100.0)
Platelets: 366 10*3/uL (ref 150–400)
RBC: 4.29 MIL/uL (ref 3.87–5.11)
RDW: 19.6 % — ABNORMAL HIGH (ref 11.5–15.5)
WBC: 6.3 10*3/uL (ref 4.0–10.5)
nRBC: 0 % (ref 0.0–0.2)

## 2020-01-26 LAB — URINALYSIS, COMPLETE (UACMP) WITH MICROSCOPIC
Bilirubin Urine: NEGATIVE
Glucose, UA: NEGATIVE mg/dL
Ketones, ur: NEGATIVE mg/dL
Nitrite: NEGATIVE
Protein, ur: NEGATIVE mg/dL
Specific Gravity, Urine: 1.012 (ref 1.005–1.030)
pH: 6 (ref 5.0–8.0)

## 2020-01-26 LAB — POC URINE PREG, ED: Preg Test, Ur: NEGATIVE

## 2020-01-26 LAB — LIPASE, BLOOD: Lipase: 100 U/L — ABNORMAL HIGH (ref 11–51)

## 2020-01-26 NOTE — ED Triage Notes (Signed)
Pt to ED from home c/o lower right, left, mid abd pain that is sharp started tonight.  Nausea without vomiting, denies diarrhea or urinary changes.  Pt states last period in June, denies being pregnant, chart states hysterectomy but patient describes having tubal ligation done.

## 2020-01-27 ENCOUNTER — Emergency Department
Admission: EM | Admit: 2020-01-27 | Discharge: 2020-01-27 | Disposition: A | Discharge status: Home / Self Care | Payer: Medicaid Other | Attending: Emergency Medicine | Admitting: Emergency Medicine

## 2020-02-21 ENCOUNTER — Other Ambulatory Visit: Payer: Self-pay | Admitting: Adult Health

## 2020-02-21 DIAGNOSIS — Z1231 Encounter for screening mammogram for malignant neoplasm of breast: Secondary | ICD-10-CM

## 2020-07-09 ENCOUNTER — Other Ambulatory Visit: Payer: Self-pay | Admitting: Family Medicine

## 2020-07-09 DIAGNOSIS — Z1231 Encounter for screening mammogram for malignant neoplasm of breast: Secondary | ICD-10-CM

## 2020-10-30 ENCOUNTER — Encounter: Payer: Self-pay | Admitting: Internal Medicine

## 2020-10-30 ENCOUNTER — Other Ambulatory Visit: Payer: Self-pay

## 2020-10-30 ENCOUNTER — Inpatient Hospital Stay: Payer: Medicaid Other | Attending: Internal Medicine | Admitting: Internal Medicine

## 2020-10-30 ENCOUNTER — Inpatient Hospital Stay: Payer: Medicaid Other

## 2020-10-30 DIAGNOSIS — E611 Iron deficiency: Secondary | ICD-10-CM | POA: Diagnosis not present

## 2020-10-30 DIAGNOSIS — D509 Iron deficiency anemia, unspecified: Secondary | ICD-10-CM | POA: Diagnosis present

## 2020-10-30 LAB — CBC WITH DIFFERENTIAL/PLATELET
Abs Immature Granulocytes: 0.02 10*3/uL (ref 0.00–0.07)
Basophils Absolute: 0 10*3/uL (ref 0.0–0.1)
Basophils Relative: 0 %
Eosinophils Absolute: 0 10*3/uL (ref 0.0–0.5)
Eosinophils Relative: 1 %
HCT: 30.3 % — ABNORMAL LOW (ref 36.0–46.0)
Hemoglobin: 9.7 g/dL — ABNORMAL LOW (ref 12.0–15.0)
Immature Granulocytes: 0 %
Lymphocytes Relative: 23 %
Lymphs Abs: 1.4 10*3/uL (ref 0.7–4.0)
MCH: 24.4 pg — ABNORMAL LOW (ref 26.0–34.0)
MCHC: 32 g/dL (ref 30.0–36.0)
MCV: 76.3 fL — ABNORMAL LOW (ref 80.0–100.0)
Monocytes Absolute: 0.6 10*3/uL (ref 0.1–1.0)
Monocytes Relative: 9 %
Neutro Abs: 4 10*3/uL (ref 1.7–7.7)
Neutrophils Relative %: 67 %
Platelets: 264 10*3/uL (ref 150–400)
RBC: 3.97 MIL/uL (ref 3.87–5.11)
RDW: 19 % — ABNORMAL HIGH (ref 11.5–15.5)
WBC: 6 10*3/uL (ref 4.0–10.5)
nRBC: 0 % (ref 0.0–0.2)

## 2020-10-30 NOTE — Assessment & Plan Note (Addendum)
#  Symptomatic iron deficient anemia-hemoglobin 9.1 MCV 66.  Iron studies-August 2021 saturation 3% [PCP].  Today hemoglobin is 9.7 microcytic.  Iron deficiency.  Recommend proceeding with IV iron.  # Discussed the potential acute infusion reactions with IV iron; which are quite rare.  Patient understands the risk; will proceed with infusions.  #Etiology of iron deficiency-likely heavy menstrual cycles.  Given ongoing abdominal discomfort nausea patient awaiting GI evaluation.  Patient would benefit from a repeat upper endoscopy/colonoscopy- awaiting insurance approval.  #Active smoker: Counseled the patient to quit smoking.  Patient understands potential adverse events related to smoking.   # DISPOSITION: # Venofer weekly x4; start this week- ASAP # follow up 8 weeks- NP; labs- cbc; possible venoferDr.B

## 2020-10-30 NOTE — Progress Notes (Signed)
[facility] CONSULT NOTE  Patient Care Team: Armando Gang, FNP as PCP - General (Family Medicine)  CHIEF COMPLAINTS/PURPOSE OF CONSULTATION: ANEMIA  HEMATOLOGY HISTORY  # IRON DEFICIENCY ANEMIA CHRONIC [since 2019] AUG 2021- hb-9; MCV- 63; Iron sat- 3%; EGD > 7 years ago [[place]]; colonoscopy/capsule-NA; PO iron constipates.   #History of heavy menstrual cycles  HISTORY OF PRESENTING ILLNESS: Alone.  Ambulating independently. Danielle [name] 45 y.o.  female with iron deficiency anemia is here for follow-up.  Patient was evaluated approximately a year ago-recommend IV iron.  However, because of insurance issues patient never started on IV iron.   Patient has poor tolerance to oral iron.  Constipation.  Continues to have worsening fatigue.  Myalgias.  Dizzy spells.  Tingling and numbness.  Patient complains of worsening fatigue over the last many months.  Complains of myalgias.  Complains of dizzy spells.  Complains of tingling and numbness around her mouth and also in the extremities.  Heavy menstrual cycles.  Nausea/vomiting intermittent abdominal discomfort.  Awaiting GI evaluation.  Review of Systems  Constitutional:  Positive for malaise/fatigue. Negative for chills, diaphoresis, fever and weight loss.  HENT:  Negative for nosebleeds and sore throat.   Eyes:  Negative for double vision.  Respiratory:  Negative for cough, hemoptysis, sputum production, shortness of breath and wheezing.   Cardiovascular:  Negative for chest pain, palpitations, orthopnea and leg swelling.  Gastrointestinal:  Positive for nausea and vomiting. Negative for abdominal pain, blood in stool, constipation, diarrhea, heartburn and melena.  Genitourinary:  Negative for dysuria, frequency and urgency.  Musculoskeletal:  Positive for myalgias. Negative for back pain and joint pain.  Skin: Negative.  Negative for itching and rash.  Neurological:  Positive for dizziness and tingling. Negative  for focal weakness, weakness and headaches.  Endo/Heme/Allergies:  Does not bruise/bleed easily.  Psychiatric/Behavioral:  Negative for depression. The patient has insomnia. The patient is not nervous/anxious.    MEDICAL HISTORY:  Past Medical History:  Diagnosis Date   Anemia    Depression     SURGICAL HISTORY: Past Surgical History:  Procedure Laterality Date   ABDOMINAL HYSTERECTOMY     TONSILLECTOMY      SOCIAL HISTORY: Social History   Socioeconomic History   Marital status: Single    Spouse name: Not on file   Number of children: Not on file   Years of education: Not on file   Highest education level: Not on file  Occupational History   Not on file  Tobacco Use   Smoking status: Every Day   Smokeless tobacco: Never  Substance and Sexual Activity   Alcohol use: Not Currently   Drug use: Never   Sexual activity: Not on file  Other Topics Concern   Not on file  Social History Narrative   Lives in [place] with 4 kids; stay at home [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    Social Determinants of Health   Financial Resource Strain: Not on file  Food Insecurity: Not on file  Transportation Needs: Not on file  Physical Activity: Not on file  Stress: Not on file  Social Connections: Not on file  Intimate Partner Violence: Not on file    FAMILY HISTORY: Family History  Problem Relation Age of Onset   Breast cancer Paternal Aunt     ALLERGIES:  has No Known Allergies.  MEDICATIONS:  No current outpatient medications on file.   No current facility-administered medications for this visit.  PHYSICAL EXAMINATION:   Vitals:   10/30/20 1035  BP: 111/65  Pulse: 67  Resp: 20  Temp: (!) 97.5 F (36.4 C)   Filed Weights   10/30/20 1035  Weight: 210 lb (95.3 kg)    Physical Exam HENT:     Head: Normocephalic and atraumatic.     Mouth/Throat:     Pharynx: No oropharyngeal exudate.  Eyes:     Pupils: Pupils are equal, round, and  reactive to light.  Cardiovascular:     Rate and Rhythm: Normal rate and regular rhythm.  Pulmonary:     Effort: Pulmonary effort is normal. No respiratory distress.     Breath sounds: Normal breath sounds. No wheezing.  Abdominal:     General: Bowel sounds are normal. There is no distension.     Palpations: Abdomen is soft. There is no mass.     Tenderness: There is no abdominal tenderness. There is no guarding or rebound.  Musculoskeletal:        General: No tenderness. Normal range of motion.     Cervical back: Normal range of motion and neck supple.  Skin:    General: Skin is warm.  Neurological:     Mental Status: She is alert and oriented to person, place, and time.  Psychiatric:        Mood and Affect: Affect normal.    LABORATORY DATA:  I have reviewed the data as listed Lab Results  Component Value Date   WBC 6.0 10/30/2020   HGB 9.7 (L) 10/30/2020   HCT 30.3 (L) 10/30/2020   MCV 76.3 (L) 10/30/2020   PLT 264 10/30/2020   Recent Labs    11/28/19 1513 01/26/20 2128  NA 138 139  K 3.8 4.3  CL 106 104  CO2 25 25  GLUCOSE 104* 98  BUN 11 17  CREATININE 0.56 0.71  CALCIUM 9.3 8.9  GFRNONAA >60 >60  PROT 8.5* 7.2  ALBUMIN 4.2 3.6  AST 18 20  ALT 14 14  ALKPHOS 72 58  BILITOT 0.5 0.4     No results found.  Iron deficiency #Symptomatic iron deficient anemia-hemoglobin 9.1 MCV 66.  Iron studies-August 2021 saturation 3% [PCP].  Today hemoglobin is 9.7 microcytic.  Iron deficiency.  Recommend proceeding with IV iron.  # Discussed the potential acute infusion reactions with IV iron; which are quite rare.  Patient understands the risk; will proceed with infusions.  #Etiology of iron deficiency-likely heavy menstrual cycles.  Given ongoing abdominal discomfort nausea patient awaiting GI evaluation.  Patient would benefit from a repeat upper endoscopy/colonoscopy- awaiting insurance approval.  #Active smoker: Counseled the patient to quit smoking.  Patient  understands potential adverse events related to smoking.   # DISPOSITION: # Venofer weekly x4; start this week- ASAP # follow up 8 weeks- NP; labs- cbc; possible venoferDr.B    All questions were answered. The patient knows to call the clinic with any problems, questions or concerns.      Earna Coder, MD 10/30/2020 12:53 PM

## 2020-11-01 ENCOUNTER — Inpatient Hospital Stay: Payer: Medicaid Other

## 2020-11-01 VITALS — BP 106/60 | HR 68 | Temp 96.0°F | Resp 18

## 2020-11-01 DIAGNOSIS — D509 Iron deficiency anemia, unspecified: Secondary | ICD-10-CM | POA: Diagnosis not present

## 2020-11-01 DIAGNOSIS — E611 Iron deficiency: Secondary | ICD-10-CM

## 2020-11-01 MED ORDER — SODIUM CHLORIDE 0.9 % IV SOLN
Freq: Once | INTRAVENOUS | Status: AC
  Filled 2020-11-01: qty 250

## 2020-11-01 MED ORDER — SODIUM CHLORIDE 0.9 % IV SOLN: 200.0000 mg | Freq: Once | INTRAVENOUS | Status: DC

## 2020-11-01 MED ORDER — IRON SUCROSE 20 MG/ML IV SOLN
200.0000 mg | Freq: Once | INTRAVENOUS | Status: AC
  Administered 2020-11-01: 200 mg via INTRAVENOUS
  Filled 2020-11-01: qty 10

## 2020-11-08 ENCOUNTER — Other Ambulatory Visit: Payer: Self-pay

## 2020-11-08 ENCOUNTER — Inpatient Hospital Stay: Payer: Medicaid Other

## 2020-11-08 VITALS — BP 107/61 | HR 75 | Temp 97.2°F | Resp 16

## 2020-11-08 DIAGNOSIS — D509 Iron deficiency anemia, unspecified: Secondary | ICD-10-CM | POA: Diagnosis not present

## 2020-11-08 DIAGNOSIS — E611 Iron deficiency: Secondary | ICD-10-CM

## 2020-11-08 MED ORDER — SODIUM CHLORIDE 0.9 % IV SOLN: 200.0000 mg | Freq: Once | INTRAVENOUS | Status: DC

## 2020-11-08 MED ORDER — SODIUM CHLORIDE 0.9 % IV SOLN
Freq: Once | INTRAVENOUS | Status: AC
  Filled 2020-11-08: qty 250

## 2020-11-08 MED ORDER — IRON SUCROSE 20 MG/ML IV SOLN
200.0000 mg | Freq: Once | INTRAVENOUS | Status: AC
  Administered 2020-11-08: 200 mg via INTRAVENOUS
  Filled 2020-11-08: qty 10

## 2020-11-08 NOTE — Patient Instructions (Signed)
[facility]   Discharge Instructions: Thank you for choosing [facility] to provide your oncology and hematology care.  If you have a lab appointment with the Cancer Center, please go directly to the Cancer Center and check in at the registration area.  We strive to give you quality time with your provider. You may need to reschedule your appointment if you arrive late (15 or more minutes).  Arriving late affects you and other patients whose appointments are after yours.  Also, if you miss three or more appointments without notifying the office, you may be dismissed from the clinic at the provider's discretion.      For prescription refill requests, have your pharmacy contact our office and allow 72 hours for refills to be completed.    Today you received the following: Venofer.      BELOW ARE SYMPTOMS THAT SHOULD BE REPORTED IMMEDIATELY: *FEVER GREATER THAN 100.4 F (38 C) OR HIGHER *CHILLS OR SWEATING *NAUSEA AND VOMITING THAT IS NOT CONTROLLED WITH YOUR NAUSEA MEDICATION *UNUSUAL SHORTNESS OF BREATH *UNUSUAL BRUISING OR BLEEDING *URINARY PROBLEMS (pain or burning when urinating, or frequent urination) *BOWEL PROBLEMS (unusual diarrhea, constipation, pain near the anus) TENDERNESS IN MOUTH AND THROAT WITH OR WITHOUT PRESENCE OF ULCERS (sore throat, sores in mouth, or a toothache) UNUSUAL RASH, SWELLING OR PAIN  UNUSUAL VAGINAL DISCHARGE OR ITCHING   Items with * indicate a potential emergency and should be followed up as soon as possible or go to the Emergency Department if any problems should occur.  Should you have questions after your visit or need to cancel or reschedule your appointment, please contact [facility]  [phone] and follow the prompts.  Office hours are 8:00 a.m. to 4:30 p.m. Monday - Friday. Please note that voicemails left after 4:00 p.m. may not be returned until the following  business day.  We are closed weekends and major holidays. You have access to a nurse at all times for urgent questions. Please call the main number to the clinic [phone] and follow the prompts.  For any non-urgent questions, you may also contact your provider using MyChart. We now offer e-Visits for anyone 18 and older to request care online for non-urgent symptoms. For details visit [email or web address].   Also download the MyChart app! Go to the app store, search "MyChart", open the app, select [facility], and log in with your MyChart username and password.  Due to Covid, a mask is required upon entering the hospital/clinic. If you do not have a mask, one will be given to you upon arrival. For doctor visits, patients may have 1 support person aged 18 or older with them. For treatment visits, patients cannot have anyone with them due to current Covid guidelines and our immunocompromised population.  

## 2020-11-15 ENCOUNTER — Other Ambulatory Visit: Payer: Self-pay | Admitting: Otolaryngology

## 2020-11-16 ENCOUNTER — Inpatient Hospital Stay: Payer: Medicaid Other

## 2020-11-16 ENCOUNTER — Other Ambulatory Visit: Payer: Self-pay

## 2020-11-16 VITALS — BP 100/62 | HR 61 | Temp 96.0°F | Resp 17

## 2020-11-16 DIAGNOSIS — D509 Iron deficiency anemia, unspecified: Secondary | ICD-10-CM | POA: Diagnosis not present

## 2020-11-16 DIAGNOSIS — E611 Iron deficiency: Secondary | ICD-10-CM

## 2020-11-16 MED ORDER — SODIUM CHLORIDE 0.9 % IV SOLN: 200.0000 mg | Freq: Once | INTRAVENOUS | Status: DC

## 2020-11-16 MED ORDER — SODIUM CHLORIDE 0.9 % IV SOLN
Freq: Once | INTRAVENOUS | Status: AC
  Filled 2020-11-16: qty 250

## 2020-11-16 MED ORDER — IRON SUCROSE 20 MG/ML IV SOLN
200.0000 mg | Freq: Once | INTRAVENOUS | Status: AC
  Administered 2020-11-16: 200 mg via INTRAVENOUS
  Filled 2020-11-16: qty 10

## 2020-11-16 NOTE — Patient Instructions (Signed)

## 2020-11-19 LAB — SURGICAL PATHOLOGY

## 2020-11-23 ENCOUNTER — Encounter (INDEPENDENT_AMBULATORY_CARE_PROVIDER_SITE_OTHER): Payer: Self-pay

## 2020-11-23 ENCOUNTER — Other Ambulatory Visit: Payer: Self-pay

## 2020-11-23 ENCOUNTER — Inpatient Hospital Stay: Payer: Medicaid Other

## 2020-11-23 VITALS — BP 105/78 | HR 69 | Temp 96.9°F | Resp 16

## 2020-11-23 DIAGNOSIS — D509 Iron deficiency anemia, unspecified: Secondary | ICD-10-CM | POA: Diagnosis not present

## 2020-11-23 DIAGNOSIS — E611 Iron deficiency: Secondary | ICD-10-CM

## 2020-11-23 MED ORDER — SODIUM CHLORIDE 0.9 % IV SOLN: 200.0000 mg | Freq: Once | INTRAVENOUS | Status: DC

## 2020-11-23 MED ORDER — IRON SUCROSE 20 MG/ML IV SOLN
200.0000 mg | Freq: Once | INTRAVENOUS | Status: AC
  Administered 2020-11-23: 200 mg via INTRAVENOUS
  Filled 2020-11-23: qty 10

## 2020-11-23 MED ORDER — SODIUM CHLORIDE 0.9 % IV SOLN
Freq: Once | INTRAVENOUS | Status: AC
  Filled 2020-11-23: qty 250

## 2020-11-23 NOTE — Progress Notes (Signed)
Patient complaining of constipation that started after her last Venofer infusion.  Dr. Donneta Romberg aware and he suggested patient take Miralax twice a day. Patient verbalized understanding.

## 2020-12-06 ENCOUNTER — Other Ambulatory Visit: Payer: Self-pay

## 2020-12-06 ENCOUNTER — Ambulatory Visit
Admission: RE | Admit: 2020-12-06 | Discharge: 2020-12-06 | Disposition: A | Discharge status: Home / Self Care | Payer: Medicaid Other | Source: Ambulatory Visit | Attending: Family Medicine | Admitting: Family Medicine

## 2020-12-06 DIAGNOSIS — Z1231 Encounter for screening mammogram for malignant neoplasm of breast: Secondary | ICD-10-CM | POA: Diagnosis present

## 2020-12-25 ENCOUNTER — Other Ambulatory Visit: Payer: Medicaid Other

## 2020-12-25 ENCOUNTER — Ambulatory Visit: Payer: Medicaid Other

## 2020-12-25 ENCOUNTER — Ambulatory Visit: Payer: Medicaid Other | Admitting: Nurse Practitioner

## 2020-12-26 ENCOUNTER — Telehealth: Payer: Self-pay | Admitting: Internal Medicine

## 2020-12-26 NOTE — Telephone Encounter (Signed)
Pt called to cancel her appt. Please give her a call back to reschedule at [phone]

## 2020-12-27 ENCOUNTER — Inpatient Hospital Stay: Payer: Medicaid Other

## 2020-12-27 ENCOUNTER — Inpatient Hospital Stay: Payer: Medicaid Other | Admitting: Nurse Practitioner

## 2021-12-13 ENCOUNTER — Other Ambulatory Visit: Payer: Self-pay | Admitting: Family Medicine

## 2021-12-13 DIAGNOSIS — Z1231 Encounter for screening mammogram for malignant neoplasm of breast: Secondary | ICD-10-CM

## 2022-01-30 ENCOUNTER — Ambulatory Visit
Admission: RE | Admit: 2022-01-30 | Discharge: 2022-01-30 | Disposition: A | Discharge status: Home / Self Care | Payer: Medicaid Other | Source: Ambulatory Visit | Attending: Family Medicine | Admitting: Family Medicine

## 2022-01-30 DIAGNOSIS — Z1231 Encounter for screening mammogram for malignant neoplasm of breast: Secondary | ICD-10-CM | POA: Insufficient documentation

## 2022-05-10 ENCOUNTER — Other Ambulatory Visit: Payer: Self-pay

## 2022-05-10 ENCOUNTER — Emergency Department: Payer: Medicaid Other

## 2022-05-10 ENCOUNTER — Inpatient Hospital Stay: Payer: Medicaid Other

## 2022-05-10 ENCOUNTER — Inpatient Hospital Stay
Admission: EM | Admit: 2022-05-10 | Discharge: 2022-05-12 | DRG: 812 | Disposition: A | Discharge status: Home / Self Care | Payer: Medicaid Other | Attending: Internal Medicine | Admitting: Internal Medicine

## 2022-05-10 DIAGNOSIS — E876 Hypokalemia: Secondary | ICD-10-CM | POA: Diagnosis present

## 2022-05-10 DIAGNOSIS — F17210 Nicotine dependence, cigarettes, uncomplicated: Secondary | ICD-10-CM | POA: Diagnosis present

## 2022-05-10 DIAGNOSIS — Z803 Family history of malignant neoplasm of breast: Secondary | ICD-10-CM

## 2022-05-10 DIAGNOSIS — D696 Thrombocytopenia, unspecified: Secondary | ICD-10-CM | POA: Diagnosis present

## 2022-05-10 DIAGNOSIS — D509 Iron deficiency anemia, unspecified: Principal | ICD-10-CM | POA: Diagnosis present

## 2022-05-10 DIAGNOSIS — D649 Anemia, unspecified: Secondary | ICD-10-CM | POA: Diagnosis not present

## 2022-05-10 DIAGNOSIS — R42 Dizziness and giddiness: Secondary | ICD-10-CM

## 2022-05-10 DIAGNOSIS — D539 Nutritional anemia, unspecified: Principal | ICD-10-CM

## 2022-05-10 DIAGNOSIS — Z7984 Long term (current) use of oral hypoglycemic drugs: Secondary | ICD-10-CM | POA: Diagnosis not present

## 2022-05-10 DIAGNOSIS — E669 Obesity, unspecified: Secondary | ICD-10-CM | POA: Diagnosis present

## 2022-05-10 DIAGNOSIS — R079 Chest pain, unspecified: Secondary | ICD-10-CM | POA: Diagnosis present

## 2022-05-10 DIAGNOSIS — R0600 Dyspnea, unspecified: Secondary | ICD-10-CM

## 2022-05-10 DIAGNOSIS — Z6839 Body mass index (BMI) 39.0-39.9, adult: Secondary | ICD-10-CM | POA: Diagnosis not present

## 2022-05-10 DIAGNOSIS — R0602 Shortness of breath: Secondary | ICD-10-CM | POA: Diagnosis present

## 2022-05-10 DIAGNOSIS — I5032 Chronic diastolic (congestive) heart failure: Secondary | ICD-10-CM | POA: Diagnosis present

## 2022-05-10 DIAGNOSIS — Z720 Tobacco use: Secondary | ICD-10-CM | POA: Diagnosis present

## 2022-05-10 DIAGNOSIS — R0609 Other forms of dyspnea: Secondary | ICD-10-CM | POA: Insufficient documentation

## 2022-05-10 DIAGNOSIS — G4733 Obstructive sleep apnea (adult) (pediatric): Secondary | ICD-10-CM | POA: Diagnosis present

## 2022-05-10 DIAGNOSIS — R7303 Prediabetes: Secondary | ICD-10-CM | POA: Diagnosis present

## 2022-05-10 DIAGNOSIS — R9431 Abnormal electrocardiogram [ECG] [EKG]: Secondary | ICD-10-CM | POA: Diagnosis not present

## 2022-05-10 DIAGNOSIS — E611 Iron deficiency: Secondary | ICD-10-CM | POA: Diagnosis present

## 2022-05-10 DIAGNOSIS — E785 Hyperlipidemia, unspecified: Secondary | ICD-10-CM | POA: Diagnosis present

## 2022-05-10 DIAGNOSIS — I272 Pulmonary hypertension, unspecified: Secondary | ICD-10-CM | POA: Diagnosis present

## 2022-05-10 DIAGNOSIS — I951 Orthostatic hypotension: Secondary | ICD-10-CM | POA: Diagnosis present

## 2022-05-10 DIAGNOSIS — E878 Other disorders of electrolyte and fluid balance, not elsewhere classified: Secondary | ICD-10-CM | POA: Diagnosis present

## 2022-05-10 LAB — TSH: TSH: 1.36 u[IU]/mL (ref 0.350–4.500)

## 2022-05-10 LAB — BASIC METABOLIC PANEL
Anion gap: 11 (ref 5–15)
BUN: 13 mg/dL (ref 6–20)
CO2: 22 mmol/L (ref 22–32)
Calcium: 9.1 mg/dL (ref 8.9–10.3)
Chloride: 104 mmol/L (ref 98–111)
Creatinine, Ser: 0.68 mg/dL (ref 0.44–1.00)
GFR, Estimated: >60 mL/min (ref >60)
Glucose, Bld: 184 mg/dL — ABNORMAL HIGH (ref 70–99)
Potassium: 3.4 mmol/L — ABNORMAL LOW (ref 3.5–5.1)
Sodium: 137 mmol/L (ref 135–145)

## 2022-05-10 LAB — TROPONIN I (HIGH SENSITIVITY)
Troponin I (High Sensitivity): 2 ng/L (ref <18)
Troponin I (High Sensitivity): 2 ng/L (ref <18)

## 2022-05-10 LAB — T4, FREE: Free T4: 0.93 ng/dL (ref 0.61–1.12)

## 2022-05-10 LAB — PROTIME-INR
INR: 1.1 (ref 0.8–1.2)
Prothrombin Time: 13.8 seconds (ref 11.4–15.2)

## 2022-05-10 LAB — HEPATIC FUNCTION PANEL
ALT: 25 U/L (ref 0–44)
AST: 32 U/L (ref 15–41)
Albumin: 3.6 g/dL (ref 3.5–5.0)
Alkaline Phosphatase: 51 U/L (ref 38–126)
Bilirubin, Direct: 0.2 mg/dL (ref 0.0–0.2)
Indirect Bilirubin: 1.3 mg/dL — ABNORMAL HIGH (ref 0.3–0.9)
Total Bilirubin: 1.5 mg/dL — ABNORMAL HIGH (ref 0.3–1.2)
Total Protein: 6.6 g/dL (ref 6.5–8.1)

## 2022-05-10 LAB — CBC
HCT: 22.5 % — ABNORMAL LOW (ref 36.0–46.0)
Hemoglobin: 7.9 g/dL — ABNORMAL LOW (ref 12.0–15.0)
MCH: 42 pg — ABNORMAL HIGH (ref 26.0–34.0)
MCHC: 35.1 g/dL (ref 30.0–36.0)
MCV: 119.7 fL — ABNORMAL HIGH (ref 80.0–100.0)
Platelets: 121 10*3/uL — ABNORMAL LOW (ref 150–400)
RBC: 1.88 MIL/uL — ABNORMAL LOW (ref 3.87–5.11)
RDW: 17.8 % — ABNORMAL HIGH (ref 11.5–15.5)
WBC: 5.5 10*3/uL (ref 4.0–10.5)
nRBC: 0.9 % — ABNORMAL HIGH (ref 0.0–0.2)

## 2022-05-10 LAB — BPAM RBC
Blood Product Expiration Date: 202405032359
Unit Type and Rh: 7300

## 2022-05-10 LAB — BRAIN NATRIURETIC PEPTIDE: B Natriuretic Peptide: 19.2 pg/mL (ref 0.0–100.0)

## 2022-05-10 LAB — BLOOD GAS, VENOUS
Acid-Base Excess: 1.2 mmol/L (ref 0.0–2.0)
Bicarbonate: 26 mmol/L (ref 20.0–28.0)
O2 Saturation: 76.6 %
Patient temperature: 37
pCO2, Ven: 41 mmHg — ABNORMAL LOW (ref 44–60)
pH, Ven: 7.41 (ref 7.25–7.43)
pO2, Ven: 46 mmHg — ABNORMAL HIGH (ref 32–45)

## 2022-05-10 LAB — FOLATE: Folate: 18.2 ng/mL (ref >5.9)

## 2022-05-10 MED ORDER — ACETAMINOPHEN 650 MG RE SUPP: 650.0000 mg | Freq: Four times a day (QID) | RECTAL | Status: DC | PRN

## 2022-05-10 MED ORDER — PANTOPRAZOLE SODIUM 40 MG IV SOLR
40.0000 mg | Freq: Two times a day (BID) | INTRAVENOUS | Status: DC
  Administered 2022-05-10 – 2022-05-12 (×4): 40 mg via INTRAVENOUS
  Filled 2022-05-10 (×4): qty 10

## 2022-05-10 MED ORDER — ACETAMINOPHEN 325 MG PO TABS
650.0000 mg | ORAL_TABLET | Freq: Four times a day (QID) | ORAL | Status: DC | PRN
  Administered 2022-05-11 – 2022-05-12 (×3): 650 mg via ORAL
  Filled 2022-05-10 (×3): qty 2

## 2022-05-10 MED ORDER — SODIUM CHLORIDE 0.9% FLUSH
3.0000 mL | Freq: Two times a day (BID) | INTRAVENOUS | Status: DC
  Administered 2022-05-11 – 2022-05-12 (×3): 3 mL via INTRAVENOUS

## 2022-05-10 MED ORDER — OXYCODONE-ACETAMINOPHEN 5-325 MG PO TABS
1.0000 | ORAL_TABLET | ORAL | Status: AC
Start: 2022-05-10 — End: 2022-05-10
  Administered 2022-05-10: 1 via ORAL
  Filled 2022-05-10: qty 1

## 2022-05-10 MED ORDER — NICOTINE 21 MG/24HR TD PT24
21.0000 mg | MEDICATED_PATCH | Freq: Every day | TRANSDERMAL | Status: DC
  Administered 2022-05-10 – 2022-05-12 (×3): 21 mg via TRANSDERMAL
  Filled 2022-05-10 (×3): qty 1

## 2022-05-10 MED ORDER — SODIUM CHLORIDE 0.9 % IV BOLUS
500.0000 mL | Freq: Once | INTRAVENOUS | Status: AC
Start: 2022-05-10 — End: 2022-05-10
  Administered 2022-05-10: 500 mL via INTRAVENOUS

## 2022-05-10 MED ORDER — LORAZEPAM 1 MG PO TABS
1.0000 mg | ORAL_TABLET | Freq: Once | ORAL | Status: AC
Start: 2022-05-10 — End: 2022-05-10
  Administered 2022-05-10: 1 mg via ORAL
  Filled 2022-05-10: qty 1

## 2022-05-10 MED ORDER — ALBUTEROL SULFATE (2.5 MG/3ML) 0.083% IN NEBU: 2.5000 mg | INHALATION_SOLUTION | Freq: Four times a day (QID) | RESPIRATORY_TRACT | Status: DC | PRN

## 2022-05-10 MED ORDER — IOHEXOL 350 MG/ML SOLN
75.0000 mL | Freq: Once | INTRAVENOUS | Status: AC | PRN
  Administered 2022-05-10: 75 mL via INTRAVENOUS

## 2022-05-10 MED ORDER — SODIUM CHLORIDE 0.9 % IV SOLN: 250.0000 mL | INTRAVENOUS | Status: DC | PRN

## 2022-05-10 MED ORDER — SODIUM CHLORIDE 0.9% FLUSH: 3.0000 mL | INTRAVENOUS | Status: DC | PRN

## 2022-05-10 NOTE — Assessment & Plan Note (Signed)
Monitor and correct.   

## 2022-05-10 NOTE — ED Provider Notes (Signed)
[facility] Provider Note    Event Date/Time   First MD Initiated Contact with Patient 05/10/22 1819     (approximate)   History   Shortness of Breath, Palpitations, and Dizziness   HPI  Danielle [name] is a 47 y.o. female reports no major medical history other than depression, anemia, and a tubal ligation about 13 years ago as well as been told by her physician that she started menopause about a year ago  She has been having some shortness of breath for about 2 weeks.  It is progressed and now is experiencing associated left-sided chest pain starting just prior to arrival to the ER.  Is located up in the left upper chest sharp and somewhat hard to describe.  She reports that she does not have any history of bleeding or vaginal bleeding, but occasionally has seen small amounts of blood in her stool off-and-on.  She does not have a history of blood clots.  She has been feeling lightheaded and when she walks she is feel short of breath and like she might pass out at times for about 2 weeks but delayed seeking medical treatment until her daughter was able to go to the prom, which she reports the daughter went to this evening  Currently reports feeling very scared because of the shortness of breath she has been experiencing.  Of note she also appears quite anxious,     Physical Exam   Triage Vital Signs: ED Triage Vitals  Enc Vitals Group     BP 05/10/22 1725 125/69     Pulse Rate 05/10/22 1725 (!) 112     Resp 05/10/22 1725 (!) 24     Temp 05/10/22 1725 98.6 F (37 C)     Temp Source 05/10/22 1725 Oral     SpO2 05/10/22 1725 100 %     Weight 05/10/22 1727 222 lb (100.7 kg)     Height 05/10/22 1727  (1.6 m)     Head Circumference --      Peak Flow --      Pain Score 05/10/22 1727 0     Pain Loc --      Pain Edu? --      Excl. in GC? --     Most recent vital signs: Vitals:   05/10/22 1930 05/10/22 2000  BP: (!) 107/57 (!) 102/58  Pulse:  (!) 104 (!) 102  Resp: (!) 26 (!) 24  Temp:    SpO2: 98% 100%     General: Awake, no distress but she does appear quite anxious.  Slightly tachypneic, reporting she feels very scared CV:  Good peripheral perfusion.  Mild tachycardia.  Heart rate variable 1 10-1 20s sinus tachycardia.  No murmurs Resp:  Normal effort.  Lung sounds are clear.  No wheezing.  She is slightly tachypneic but reports feeling scared and anxious as well. Abd:  No distention.  Soft nontender nondistended does not appear gravid Other:  No noted lower extremity edema or swelling   ED Results / Procedures / Treatments   Labs (all labs ordered are listed, but only abnormal results are displayed) Labs Reviewed  BASIC METABOLIC PANEL - Abnormal; Notable for the following components:      Result Value   Potassium 3.4 (*)    Glucose, Bld 184 (*)    All other components within normal limits  CBC - Abnormal; Notable for the following components:   RBC 1.88 (*)    Hemoglobin  7.9 (*)    HCT 22.5 (*)    MCV 119.7 (*)    MCH 42.0 (*)    RDW 17.8 (*)    Platelets 121 (*)    nRBC 0.9 (*)    All other components within normal limits  PROTIME-INR  HEMOGLOBIN A1C  HEPATIC FUNCTION PANEL  BRAIN NATRIURETIC PEPTIDE  T4, FREE  TSH  BLOOD GAS, VENOUS  TYPE AND SCREEN  TROPONIN I (HIGH SENSITIVITY)  TROPONIN I (HIGH SENSITIVITY)     EKG  Interpreted by me at 1820 heart rate 110 QRS 80 QTc 430 Sinus tachycardia.  No evidence of ischemia   RADIOLOGY Chest x-ray interpreted by me as negative for acute   CT Angio Chest PE W and/or Wo Contrast  Result Date: 05/10/2022 CLINICAL DATA:  Pulmonary embolism (PE) suspected, high prob Shortness of breath. EXAM: CT ANGIOGRAPHY CHEST WITH CONTRAST TECHNIQUE: Multidetector CT imaging of the chest was performed using the standard protocol during bolus administration of intravenous contrast. Multiplanar CT image reconstructions and MIPs were obtained to evaluate the vascular  anatomy. RADIATION DOSE REDUCTION: This exam was performed according to the departmental dose-optimization program which includes automated exposure control, adjustment of the mA and/or kV according to patient size and/or use of iterative reconstruction technique. CONTRAST:  66mL OMNIPAQUE IOHEXOL 350 MG/ML SOLN COMPARISON:  Radiograph earlier today. Chest CT 05/31/2007 FINDINGS: Cardiovascular: There are no filling defects within the pulmonary arteries to suggest pulmonary embolus. Main pulmonary artery is dilated at 3.9 cm. The thoracic aorta is normal in caliber. No aortic dissection or acute aortic findings. Heart is normal in size. No pericardial effusion. Mediastinum/Nodes: No mediastinal or hilar adenopathy. No visualized thyroid nodule. Unremarkable esophagus. Lungs/Pleura: Mild bronchial thickening and heterogeneous pulmonary parenchyma. Linear subsegmental opacity in the right upper lobe and left lower lobe. Previous 4 mm left lower lobe nodule is not seen. There is no pleural effusion. Upper Abdomen: No acute upper abdominal findings. Musculoskeletal: There are no acute or suspicious osseous abnormalities. Review of the MIP images confirms the above findings. IMPRESSION: 1. No pulmonary embolus. 2. Mild bronchial thickening and heterogeneous pulmonary parenchyma, can be seen with small airways disease. Linear subsegmental opacity in the right upper lobe and left lower lobe may be atelectasis or scarring. 3. Dilatation of the main pulmonary artery suggesting pulmonary arterial hypertension. Electronically Signed   By: Narda Rutherford M.D.   On: 05/10/2022 19:14   DG Chest 2 View  Result Date: 05/10/2022 CLINICAL DATA:  Shortness of breath EXAM: CHEST - 2 VIEW COMPARISON:  Chest x-ray May 31, 2007 FINDINGS: The cardiomediastinal silhouette is unchanged in contour. No focal pulmonary opacity. No pleural effusion or pneumothorax. The visualized upper abdomen is unremarkable. No acute osseous abnormality.  IMPRESSION: No active cardiopulmonary disease. Electronically Signed   By: Jacob Moores M.D.   On: 05/10/2022 17:52      PROCEDURES:  Critical Care performed: No  Procedures   MEDICATIONS ORDERED IN ED: Medications  pantoprazole (PROTONIX) injection 40 mg (has no administration in time range)  sodium chloride 0.9 % bolus 500 mL (0 mLs Intravenous Stopped 05/10/22 2002)  LORazepam (ATIVAN) tablet 1 mg (1 mg Oral Given 05/10/22 1830)  iohexol (OMNIPAQUE) 350 MG/ML injection 75 mL (75 mLs Intravenous Contrast Given 05/10/22 1838)  oxyCODONE-acetaminophen (PERCOCET/ROXICET) 5-325 MG per tablet 1 tablet (1 tablet Oral Given 05/10/22 2004)     IMPRESSION / MDM / ASSESSMENT AND PLAN / ED COURSE  I reviewed the triage vital signs and the  nursing notes.                              Differential diagnosis includes, but is not limited to, symptomatic anemia, ACS this seems unlikely given reassuring EKG and initial troponin, cause anemia not yet clear but she does intermittently report for several months seeing occasional blood in her stool, denies any vaginal bleeding reports previously through menopause, does also report a history of anemia and it is noted that she has a macrocytic anemia  Given the associated shortness of breath tachycardia as well I think this raises concern for potential other causes in the chest such as pulmonary embolism, I will proceed with CT imaging given the concerns of tachycardia dyspnea and now left upper chest pain but does not appear to be an obvious cardiac cause  Patient's presentation is most consistent with acute complicated illness / injury requiring diagnostic workup.   The patient is on the cardiac monitor to evaluate for evidence of arrhythmia and/or significant heart rate changes.  Notable also the patient has a thrombocytopenia, though not severe.  Labs demonstrate a acute on chronic anemia.  Troponin  normal.  ----------------------------------------- 7:50 PM on 05/10/2022 ----------------------------------------- The patient is findings of anemia with new thrombocytopenia, CT chest with potential concern for pulmonary hypertension or dilated pulmonary artery, and also given her degree of anemia I am somewhat suspicious that she may be suffering symptomatic anemia or potentially signs or symptoms    ----------------------------------------- 8:17 PM on 05/10/2022 ----------------------------------------- Consulted with the hospitalist, patient will be placed under the hospitalist service to the care of Dr. Renaldo Reel  Patient understanding agreeable plan for admission.  Reports some mild lingering element of chest discomfort, but much improved after receiving Ativan.  She is resting comfortably.  FINAL CLINICAL IMPRESSION(S) / ED DIAGNOSES   Final diagnoses:  Macrocytic anemia  Dyspnea, unspecified type  Chest pain with low risk for cardiac etiology  Thrombocytopenia     Rx / DC Orders   ED Discharge Orders     None        Note:  This document was prepared using Dragon voice recognition software and may include unintentional dictation errors.   Sharyn Creamer, MD 05/10/22 2018

## 2022-05-10 NOTE — ED Notes (Signed)
Dr. Patel at bedside 

## 2022-05-10 NOTE — Assessment & Plan Note (Addendum)
Orthostatic hypotension.  Fall precaution.  With pt's palpitation/ dizziness/ hypertension TIA is also less likely. But we will get Mri and evaluate for TIA.

## 2022-05-10 NOTE — Assessment & Plan Note (Signed)
SpO2: 99 % D/d include GERD variant asthma/ CAD /CHF/ IPF We will continue with cont pulse ox.  Ambulatory Pulse ox and PFT if needed.  Supplemental oxygen .

## 2022-05-10 NOTE — ED Notes (Signed)
Patient to CT at this time

## 2022-05-10 NOTE — Assessment & Plan Note (Signed)
FT4/TSH A1c.  OSA eval.

## 2022-05-10 NOTE — Assessment & Plan Note (Signed)
EKG currently nonischemic.  Abnormality c/w pulmonary htn dilated biatrial enlargement.

## 2022-05-10 NOTE — Assessment & Plan Note (Signed)
-  Nicotine patch 

## 2022-05-10 NOTE — ED Triage Notes (Signed)
Pt to ED POV for SOB, feels like heart is racing and intermittent dizziness since 4 days ago. Everything started suddenly when she was taking a shower and suddenly felt dizzy and thought was going to faint but did not faint. Denies chest pain. Slightly tachycardic and tachypneic in triage. Blue top sent with labs and 20# placed.

## 2022-05-10 NOTE — Assessment & Plan Note (Signed)
Type / screen IV PPI.

## 2022-05-10 NOTE — ED Notes (Signed)
Patient transported to MRI 

## 2022-05-10 NOTE — Assessment & Plan Note (Signed)
OSA eval PFT  ( O/P) and echo .

## 2022-05-10 NOTE — H&P (Signed)
History and Physical     Patient: Danielle [name] ZOX:[id number] DOB: 1975/05/25 DOA: 05/10/2022 DOS: the patient was seen and examined on 05/10/2022 PCP: Armando Gang, FNP   Patient coming from: Home  Chief Complaint: SOB.  HISTORY OF PRESENT ILLNESS: ALECIA [name] is an 47 y.o. female seen in ed for SOB/ DOE/ palpitation and dizziness. Pt has h/o iron def anemia and does report intermittent blood in stool .  Chart review shows patient was seen by GI in 2021 Dr. Norma Fredrickson with lower GI eval being ordered however no results in chart I am not sure if the patient had this rescheduled or canceled.   Past Medical History:  Diagnosis Date   Anemia    Depression    Review of Systems  Constitutional:  Positive for fatigue.  Respiratory:  Positive for shortness of breath.   Cardiovascular:  Positive for chest pain, palpitations and leg swelling.  Neurological:  Positive for dizziness.  Psychiatric/Behavioral:  The patient is nervous/anxious.   All other systems reviewed and are negative.  No Known Allergies Past Surgical History:  Procedure Laterality Date   ABDOMINAL HYSTERECTOMY     TONSILLECTOMY     MEDICATIONS: Prior to Admission medications   Not on File    nicotine  21 mg Transdermal Daily   pantoprazole (PROTONIX) IV  40 mg Intravenous Q12H   sodium chloride flush  3 mL Intravenous Q12H   sodium chloride flush  3 mL Intravenous Q12H   ED Course: Pt in Ed meets SIRS criteria with heart rate and respiratory rate no infection suspected. Vitals:   05/10/22 2146 05/10/22 2147 05/10/22 2148 05/10/22 2200  BP: (!) 89/50 98/64 (!) 101/54 (!) 98/58  Pulse:    89  Resp: 18 18 19 18   Temp:      TempSrc:      SpO2:    98%  Weight:      Height:      Total I/O In: 500 [IV Piggyback:500] Out: -  SpO2: 98 % Blood work in ed shows: Hypokalemia 3.4, glucose 184, normal creatinine, normal EGFR more than 60. LFTs added CBC shows a normal white count of 5.5 RBC of 1.88  hemoglobin of 7.9 MCV 119 platelets of 121. Troponin of 2  EKG shows : s tach 114 / LVH .      Results for orders placed or performed during the hospital encounter of 05/10/22 (from the past 72 hour(s))  Basic metabolic panel     Status: Abnormal   Collection Time: 05/10/22  5:28 PM  Result Value Ref Range   Sodium 137 135 - 145 mmol/L   Potassium 3.4 (L) 3.5 - 5.1 mmol/L   Chloride 104 98 - 111 mmol/L   CO2 22 22 - 32 mmol/L   Glucose, Bld 184 (H) 70 - 99 mg/dL    Comment: Glucose reference range applies only to samples taken after fasting for at least 8 hours.   BUN 13 6 - 20 mg/dL   Creatinine, Ser 8.11 0.44 - 1.00 mg/dL   Calcium 9.1 8.9 - 91.4 mg/dL   GFR, Estimated >78 >29 mL/min    Comment: (NOTE) Calculated using the CKD-EPI Creatinine Equation (2021)    Anion gap 11 5 - 15    Comment: Performed at [facility], [address] [place], [place] [address]  CBC     Status: Abnormal   Collection Time: 05/10/22  5:28 PM  Result Value Ref Range   WBC 5.5  4.0 - 10.5 K/uL   RBC 1.88 (L) 3.87 - 5.11 MIL/uL   Hemoglobin 7.9 (L) 12.0 - 15.0 g/dL   HCT 11.9 (L) 14.7 - 82.9 %   MCV 119.7 (H) 80.0 - 100.0 fL   MCH 42.0 (H) 26.0 - 34.0 pg   MCHC 35.1 30.0 - 36.0 g/dL   RDW 56.2 (H) 13.0 - 86.5 %   Platelets 121 (L) 150 - 400 K/uL   nRBC 0.9 (H) 0.0 - 0.2 %    Comment: Performed at [facility], [address] [place], [place] [address]  Troponin I (High Sensitivity)     Status: None   Collection Time: 05/10/22  5:28 PM  Result Value Ref Range   Troponin I (High Sensitivity) 2 <18 ng/L    Comment: (NOTE) Elevated high sensitivity troponin I (hsTnI) values and significant  changes across serial measurements may suggest ACS but many other  chronic and acute conditions are known to elevate hsTnI results.  Refer to the "Links" section for chest pain algorithms and additional  guidance. Performed at [facility], [address]  [place], [place] [address]   Protime-INR     Status: None   Collection Time: 05/10/22  5:30 PM  Result Value Ref Range   Prothrombin Time 13.8 11.4 - 15.2 seconds   INR 1.1 0.8 - 1.2    Comment: (NOTE) INR goal varies based on device and disease states. Performed at [facility], [address] [place], [place] [address]   Brain natriuretic peptide     Status: None   Collection Time: 05/10/22  5:30 PM  Result Value Ref Range   B Natriuretic Peptide 19.2 0.0 - 100.0 pg/mL    Comment: Performed at [facility], [address] [place], [place] [address]  Type and screen [facility]     Status: None (Preliminary result)   Collection Time: 05/10/22  6:33 PM  Result Value Ref Range   ABO/RH(D) B POS    Antibody Screen POS    Sample Expiration 05/13/2022,2359    Antibody Identification ANTI K    Unit Number [id number]    Blood Component Type RED CELLS,LR    Unit division 00    Status of Unit ALLOCATED    Transfusion Status OK TO TRANSFUSE    Crossmatch Result COMPATIBLE    Unit Number [id number]    Blood Component Type RED CELLS,LR    Unit division 00    Status of Unit ALLOCATED    Transfusion Status OK TO TRANSFUSE    Crossmatch Result COMPATIBLE   Troponin I (High Sensitivity)     Status: None   Collection Time: 05/10/22  7:48 PM  Result Value Ref Range   Troponin I (High Sensitivity) 2 <18 ng/L    Comment: (NOTE) Elevated high sensitivity troponin I (hsTnI) values and significant  changes across serial measurements may suggest ACS but many other  chronic and acute conditions are known to elevate hsTnI results.  Refer to the "Links" section for chest pain algorithms and additional  guidance. Performed at [facility], [address] [place], [place] [address]   Hepatic function panel     Status: Abnormal   Collection Time: 05/10/22  8:30 PM  Result Value Ref Range   Total Protein 6.6 6.5 - 8.1 g/dL   Albumin 3.6  3.5 - 5.0 g/dL   AST 32 15 - 41 U/L   ALT 25 0 - 44 U/L  Alkaline Phosphatase 51 38 - 126 U/L   Total Bilirubin 1.5 (H) 0.3 - 1.2 mg/dL   Bilirubin, Direct 0.2 0.0 - 0.2 mg/dL   Indirect Bilirubin 1.3 (H) 0.3 - 0.9 mg/dL    Comment: Performed at [facility], [address] [place], [place] [address]  T4, free     Status: None   Collection Time: 05/10/22  8:30 PM  Result Value Ref Range   Free T4 0.93 0.61 - 1.12 ng/dL    Comment: (NOTE) Biotin ingestion may interfere with free T4 tests. If the results are inconsistent with the TSH level, previous test results, or the clinical presentation, then consider biotin interference. If needed, order repeat testing after stopping biotin. Performed at [facility], [address] [place], [place] [address]   TSH     Status: None   Collection Time: 05/10/22  8:30 PM  Result Value Ref Range   TSH 1.360 0.350 - 4.500 uIU/mL    Comment: Performed by a 3rd Generation assay with a functional sensitivity of <=0.01 uIU/mL. Performed at [facility], [address] [place], [place] [address]   Folate     Status: None   Collection Time: 05/10/22  8:30 PM  Result Value Ref Range   Folate 18.2 >5.9 ng/mL    Comment: Performed at [facility], [address] [place], [place] [address]  Blood gas, venous     Status: Abnormal   Collection Time: 05/10/22 10:01 PM  Result Value Ref Range   pH, Ven 7.41 7.25 - 7.43   pCO2, Ven 41 (L) 44 - 60 mmHg   pO2, Ven 46 (H) 32 - 45 mmHg   Bicarbonate 26.0 20.0 - 28.0 mmol/L   Acid-Base Excess 1.2 0.0 - 2.0 mmol/L   O2 Saturation 76.6 %   Patient temperature 37.0    Collection site VEIN     Comment: Performed at [facility], [address] [place], [place] [address]    Lab Results  Component Value Date   CREATININE 0.68 05/10/2022   CREATININE 0.71 01/26/2020   CREATININE 0.56 11/28/2019      Latest Ref Rng & Units 05/10/2022    8:30 PM 05/10/2022     5:28 PM 01/26/2020    9:28 PM  CMP  Glucose 70 - 99 mg/dL  130  98   BUN 6 - 20 mg/dL  13  17   Creatinine 8.65 - 1.00 mg/dL  7.84  6.96   Sodium 295 - 145 mmol/L  137  139   Potassium 3.5 - 5.1 mmol/L  3.4  4.3   Chloride 98 - 111 mmol/L  104  104   CO2 22 - 32 mmol/L  22  25   Calcium 8.9 - 10.3 mg/dL  9.1  8.9   Total Protein 6.5 - 8.1 g/dL 6.6   7.2   Total Bilirubin 0.3 - 1.2 mg/dL 1.5   0.4   Alkaline Phos 38 - 126 U/L 51   58   AST 15 - 41 U/L 32   20   ALT 0 - 44 U/L 25   14    Unresulted Labs (From admission, onward)     Start     Ordered   05/10/22 2115  Hemoglobin A1c  Once,   AD        05/10/22 2115   05/10/22 2100  HIV Antibody (routine testing w rflx)  Once,   R  05/10/22 2100   05/10/22 2046  Urinalysis, Routine w reflex microscopic -Urine, Clean Catch  Once,   URGENT       Question:  Specimen Source  Answer:  Urine, Clean Catch   05/10/22 2046   05/10/22 2046  Occult blood card to lab, stool  Once,   URGENT        05/10/22 2046   05/10/22 2046  Vitamin B12  Once,   URGENT        05/10/22 2046           Pt has received : Orders Placed This Encounter  Procedures   DG Chest 2 View    If patient pregnant, contact provider.    Standing Status:   Standing    Number of Occurrences:   1    Order Specific Question:   Reason for Exam (SYMPTOM  OR DIAGNOSIS REQUIRED)    Answer:   SOB    Order Specific Question:   Radiology Contrast Protocol - do NOT remove file path    Answer:   [email or web address]   CT Angio Chest PE W and/or Wo Contrast    Standing Status:   Standing    Number of Occurrences:   1    Order Specific Question:   Does the patient have a contrast media/X-ray dye allergy?    Answer:   No    Order Specific Question:   If indicated for the ordered procedure, I authorize the administration of contrast media per Radiology protocol    Answer:   Yes    Order Specific Question:   Is patient  pregnant?    Answer:   No    Comments:   tubal ligation, reports menopause for > 1 year    Order Specific Question:   Radiology Contrast Protocol - do NOT remove file path    Answer:   [email or web address]   MR BRAIN WO CONTRAST    Standing Status:   Standing    Number of Occurrences:   1    Order Specific Question:   What is the patient's sedation requirement?    Answer:   No Sedation    Order Specific Question:   Does the patient have a pacemaker or implanted devices?    Answer:   No   Basic metabolic panel    Standing Status:   Standing    Number of Occurrences:   1   CBC    Standing Status:   Standing    Number of Occurrences:   1   Protime-INR    Standing Status:   Standing    Number of Occurrences:   1   Hepatic function panel    Standing Status:   Standing    Number of Occurrences:   1   Brain natriuretic peptide    Standing Status:   Standing    Number of Occurrences:   1   T4, free    Standing Status:   Standing    Number of Occurrences:   1   TSH    Standing Status:   Standing    Number of Occurrences:   1   Blood gas, venous    Standing Status:   Standing    Number of Occurrences:   1   Urinalysis, Routine w reflex microscopic -Urine, Clean Catch    Standing Status:   Standing    Number of Occurrences:   1    Order Specific Question:   Specimen Source  Answer:   Urine, Clean Catch [76]   Occult blood card to lab, stool    Standing Status:   Standing    Number of Occurrences:   1   Vitamin B12    Standing Status:   Standing    Number of Occurrences:   1   Folate    Standing Status:   Standing    Number of Occurrences:   1   HIV Antibody (routine testing w rflx)    Standing Status:   Standing    Number of Occurrences:   1   Hemoglobin A1c    Standing Status:   Standing    Number of Occurrences:   1   Diet heart healthy/carb modified Room service appropriate? Yes; Fluid consistency: Thin    Standing Status:    Standing    Number of Occurrences:   1    Order Specific Question:   Diet-HS Snack?    Answer:   Nothing    Order Specific Question:   Room service appropriate?    Answer:   Yes    Order Specific Question:   Fluid consistency:    Answer:   Thin   Document Height and Actual Weight    Use scales to weigh patient, not stated or estimated weight.    Standing Status:   Standing    Number of Occurrences:   1   Maintain IV access    Standing Status:   Standing    Number of Occurrences:   1   Vital signs    Standing Status:   Standing    Number of Occurrences:   1   Notify physician (specify)    Standing Status:   Standing    Number of Occurrences:   20    Order Specific Question:   Notify Physician    Answer:   for pulse less than 55 or greater than 120    Order Specific Question:   Notify Physician    Answer:   for respiratory rate less than 12 or greater than 25    Order Specific Question:   Notify Physician    Answer:   for temperature greater than 100.5 F    Order Specific Question:   Notify Physician    Answer:   for urinary output less than 30 mL/hr for four hours    Order Specific Question:   Notify Physician    Answer:   for systolic BP less than 90 or greater than 160, diastolic BP less than 60 or greater than 100    Order Specific Question:   Notify Physician    Answer:   for new hypoxia w/ oxygen saturations < 88%   Progressive Mobility Protocol: No Restrictions    Standing Status:   Standing    Number of Occurrences:   1   Daily weights    Standing Status:   Standing    Number of Occurrences:   1   Intake and Output    Standing Status:   Standing    Number of Occurrences:   1   Do not place and if present remove PureWick    Standing Status:   Standing    Number of Occurrences:   1   Initiate Oral Care Protocol    Standing Status:   Standing    Number of Occurrences:   1   Initiate Carrier Fluid Protocol    Standing Status:   Standing    Number of Occurrences:  1   RN may order General Admission PRN Orders utilizing "General Admission PRN medications" (through manage orders) for the following patient needs: allergy symptoms (Claritin), cold sores (Carmex), cough (Robitussin DM), eye irritation (Liquifilm Tears), hemorrhoids (Tucks), indigestion (Maalox), minor skin irritation (Hydrocortisone Cream), muscle pain Romeo Apple Gay), nose irritation (saline nasal spray) and sore throat (Chloraseptic spray).    Standing Status:   Standing    Number of Occurrences:   [phone]   SCDs    Standing Status:   Standing    Number of Occurrences:   1    Order Specific Question:   Laterality    Answer:   Bilateral   Cardiac Monitoring Continuous x 48 hours Indications for use: Other; Other indications for use: SOB    Standing Status:   Standing    Number of Occurrences:   1    Order Specific Question:   Indications for use:    Answer:   Other    Order Specific Question:   Other indications for use:    Answer:   SOB   Nurse to provide smoking / tobacco cessation education    Standing Status:   Standing    Number of Occurrences:   1   Orthostatic vital signs    Standing Status:   Standing    Number of Occurrences:   1   Full code    Standing Status:   Standing    Number of Occurrences:   1    Order Specific Question:   By:    Answer:   Other   Consult to hospitalist    Standing Status:   Standing    Number of Occurrences:   1    Order Specific Question:   Place call to:    Answer:   hospitalist    Order Specific Question:   Reason for Consult    Answer:   Admit    Order Specific Question:   Diagnosis/Clinical Info for Consult:    Answer:   chest pain, dyspnea, anemia   Consult to Registered Dietitian    Standing Status:   Standing    Number of Occurrences:   1    Order Specific Question:   Reason for consult?    Answer:   Assessment of nutrition requirements/status    Order Specific Question:   Reason for consult?    Answer:   Diet Education   Oxygen therapy  Mode or (Route): Nasal cannula; Liters Per Minute: 2; Keep 02 saturation: greater than 92 %    Standing Status:   Standing    Number of Occurrences:   20    Order Specific Question:   Mode or (Route)    Answer:   Nasal cannula    Order Specific Question:   Liters Per Minute    Answer:   2    Order Specific Question:   Keep 02 saturation    Answer:   greater than 92 %   Pulse oximetry, continuous    Standing Status:   Standing    Number of Occurrences:   1   EKG 12-Lead    Standing Status:   Standing    Number of Occurrences:   1   ED EKG    Standing Status:   Standing    Number of Occurrences:   1    Order Specific Question:   Reason for Exam    Answer:   Shortness of breath   ECHOCARDIOGRAM COMPLETE    SOB/  PALPITATION/ CHEST PAIN/ DIZZINESS.    Standing Status:   Standing    Number of Occurrences:   1    Order Specific Question:   Perflutren DEFINITY (image enhancing agent) should be administered unless hypersensitivity or allergy exist    Answer:   Administer Perflutren    Order Specific Question:   Reason for exam-Echo    Answer:   Abnormal ECG  R94.31   Type and screen [facility]    [facility]     Standing Status:   Standing    Number of Occurrences:   1   Admit to Inpatient (patient's expected length of stay will be greater than 2 midnights or inpatient only procedure)    Standing Status:   Standing    Number of Occurrences:   1    Order Specific Question:   Hospital Area    Answer:   [facility] [[id number]]    Order Specific Question:   Level of Care    Answer:   Telemetry Cardiac [103]    Order Specific Question:   Covid Evaluation    Answer:   Asymptomatic - no recent exposure (last 10 days) testing not required    Order Specific Question:   Diagnosis    Answer:   SOB (shortness of breath) [161096]    Order Specific Question:   Admitting Physician    Answer:   Darrold Junker    Order Specific  Question:   Attending Physician    Answer:   Darrold Junker    Order Specific Question:   Certification:    Answer:   I certify this patient will need inpatient services for at least 2 midnights    Order Specific Question:   Estimated Length of Stay    Answer:   2   Aspiration precautions    Standing Status:   Standing    Number of Occurrences:   1   Fall precautions    Standing Status:   Standing    Number of Occurrences:   1    Meds ordered this encounter  Medications   sodium chloride 0.9 % bolus 500 mL   LORazepam (ATIVAN) tablet 1 mg   iohexol (OMNIPAQUE) 350 MG/ML injection 75 mL   oxyCODONE-acetaminophen (PERCOCET/ROXICET) 5-325 MG per tablet 1 tablet   pantoprazole (PROTONIX) injection 40 mg   sodium chloride flush (NS) 0.9 % injection 3 mL   OR Linked Order Group    acetaminophen (TYLENOL) tablet 650 mg    acetaminophen (TYLENOL) suppository 650 mg   sodium chloride flush (NS) 0.9 % injection 3 mL   sodium chloride flush (NS) 0.9 % injection 3 mL   0.9 %  sodium chloride infusion   nicotine (NICODERM CQ - dosed in mg/24 hours) patch 21 mg   albuterol (PROVENTIL) (2.5 MG/3ML) 0.083% nebulizer solution 2.5 mg    Admission Imaging : MR BRAIN WO CONTRAST  Result Date: 05/10/2022 CLINICAL DATA:  Initial evaluation for dizziness. EXAM: MRI HEAD WITHOUT CONTRAST TECHNIQUE: Multiplanar, multiecho pulse sequences of the brain and surrounding structures were obtained without intravenous contrast. COMPARISON:  CT from 05/26/2008. FINDINGS: Brain: Cerebral volume within normal limits. No significant cerebral white matter disease or other focal parenchymal signal abnormality. No evidence for acute or subacute ischemia. Gray-white matter differentiation maintained. No areas of chronic cortical infarction. No acute or chronic intracranial blood products. No mass lesion, midline shift or mass effect. No hydrocephalus or extra-axial  fluid collection. Pituitary gland and suprasellar  region within normal limits. Vascular: Major intracranial vascular flow voids are maintained. Skull and upper cervical spine: Craniocervical junction normal. Diffusely decreased T1 signal intensity seen throughout the visualized bone marrow, nonspecific, but most commonly related to anemia, smoking, or obesity. No scalp soft tissue abnormality. Sinuses/Orbits: Globes and orbital soft tissues within normal limits. Mild scattered mucosal thickening noted about the ethmoidal air cells. Paranasal sinuses are otherwise largely clear. Trace bilateral mastoid effusions noted, bowel significance. Other: None. IMPRESSION: 1. Normal brain MRI. No acute intracranial abnormality identified. 2. Decreased T1 signal intensity throughout the visualized bone marrow, nonspecific, but most commonly seen with anemia, smoking, or obesity. Correlation with laboratory values and history suggested. Electronically Signed   By: Rise Mu M.D.   On: 05/10/2022 21:45   CT Angio Chest PE W and/or Wo Contrast  Result Date: 05/10/2022 CLINICAL DATA:  Pulmonary embolism (PE) suspected, high prob Shortness of breath. EXAM: CT ANGIOGRAPHY CHEST WITH CONTRAST TECHNIQUE: Multidetector CT imaging of the chest was performed using the standard protocol during bolus administration of intravenous contrast. Multiplanar CT image reconstructions and MIPs were obtained to evaluate the vascular anatomy. RADIATION DOSE REDUCTION: This exam was performed according to the departmental dose-optimization program which includes automated exposure control, adjustment of the mA and/or kV according to patient size and/or use of iterative reconstruction technique. CONTRAST:  75mL OMNIPAQUE IOHEXOL 350 MG/ML SOLN COMPARISON:  Radiograph earlier today. Chest CT 05/31/2007 FINDINGS: Cardiovascular: There are no filling defects within the pulmonary arteries to suggest pulmonary embolus. Main pulmonary artery is dilated at 3.9 cm. The thoracic aorta is normal  in caliber. No aortic dissection or acute aortic findings. Heart is normal in size. No pericardial effusion. Mediastinum/Nodes: No mediastinal or hilar adenopathy. No visualized thyroid nodule. Unremarkable esophagus. Lungs/Pleura: Mild bronchial thickening and heterogeneous pulmonary parenchyma. Linear subsegmental opacity in the right upper lobe and left lower lobe. Previous 4 mm left lower lobe nodule is not seen. There is no pleural effusion. Upper Abdomen: No acute upper abdominal findings. Musculoskeletal: There are no acute or suspicious osseous abnormalities. Review of the MIP images confirms the above findings. IMPRESSION: 1. No pulmonary embolus. 2. Mild bronchial thickening and heterogeneous pulmonary parenchyma, can be seen with small airways disease. Linear subsegmental opacity in the right upper lobe and left lower lobe may be atelectasis or scarring. 3. Dilatation of the main pulmonary artery suggesting pulmonary arterial hypertension. Electronically Signed   By: Narda Rutherford M.D.   On: 05/10/2022 19:14   DG Chest 2 View  Result Date: 05/10/2022 CLINICAL DATA:  Shortness of breath EXAM: CHEST - 2 VIEW COMPARISON:  Chest x-ray May 31, 2007 FINDINGS: The cardiomediastinal silhouette is unchanged in contour. No focal pulmonary opacity. No pleural effusion or pneumothorax. The visualized upper abdomen is unremarkable. No acute osseous abnormality. IMPRESSION: No active cardiopulmonary disease. Electronically Signed   By: Jacob Moores M.D.   On: 05/10/2022 17:52   Physical Examination: Vitals:   05/10/22 2146 05/10/22 2147 05/10/22 2148 05/10/22 2200  BP: (!) 89/50 98/64 (!) 101/54 (!) 98/58  Pulse:    89  Temp:      Resp: 18 18 19 18   Height:      Weight:      SpO2:    98%  TempSrc:      BMI (Calculated):       Physical Exam Vitals and nursing note reviewed.  Constitutional:      General: She is not in  acute distress.    Appearance: She is obese. She is not ill-appearing,  toxic-appearing or diaphoretic.  HENT:     Head: Normocephalic and atraumatic.     Right Ear: Hearing and external ear normal.     Left Ear: Hearing and external ear normal.     Nose: Nose normal. No nasal deformity.     Mouth/Throat:     Lips: Pink.     Mouth: Mucous membranes are moist.     Tongue: No lesions.     Pharynx: Oropharynx is clear.  Eyes:     Extraocular Movements: Extraocular movements intact.  Cardiovascular:     Rate and Rhythm: Regular rhythm. Tachycardia present.     Pulses: Normal pulses.     Heart sounds: Normal heart sounds.  Pulmonary:     Effort: Pulmonary effort is normal.     Breath sounds: Normal breath sounds.  Abdominal:     General: Bowel sounds are normal. There is no distension.     Palpations: Abdomen is soft. There is no mass.     Tenderness: There is no abdominal tenderness. There is no guarding.     Hernia: No hernia is present.  Musculoskeletal:     Right lower leg: No edema.     Left lower leg: No edema.  Skin:    General: Skin is warm.  Neurological:     General: No focal deficit present.     Mental Status: She is alert and oriented to person, place, and time.     Cranial Nerves: Cranial nerves 2-12 are intact.     Motor: Motor function is intact.  Psychiatric:        Attention and Perception: Attention normal.        Mood and Affect: Mood normal.        Speech: Speech normal.        Behavior: Behavior normal. Behavior is cooperative.        Cognition and Memory: Cognition normal.     Assessment and Plan:  >>SOB/DOE >>Dizziness >>Pulmonary HTN >>Irodn def aneimai >>Tobacco abuse >>Electrolyte abnormality >>Obesity / Elevated Glucose >>Anemia  * SOB (shortness of breath) SpO2: 99 % D/d include GERD variant asthma/ CAD /CHF/ IPF We will continue with cont pulse ox.  Ambulatory Pulse ox and PFT if needed.  Supplemental oxygen .    DOE (dyspnea on exertion) ? Ischemic heart disease.  We will schedule pt for echo and  cardiology consult per AM team. Pt has moderate risk factors: Dyslipidemia/ obesity/ undelrying prediabetes or undiagnosed dm II. Cardiology consult per AM team for stress test.  Lipid panel/ TFT.    Chest pain EKG currently nonischemic.  Abnormality c/w pulmonary htn dilated biatrial enlargement.     Pulmonary HTN OSA eval PFT  ( O/P) and echo .  Obesity (BMI 30-39.9) FT4/TSH A1c.  OSA eval.   Electrolyte abnormality Monitor and correct.    Tobacco abuse Nicotine patch.   Dizziness Orthostatic hypotension.  Fall precaution.  With pt's palpitation/ dizziness/ hypertension TIA is also less likely. But we will get Mri and evaluate for TIA.   Iron deficiency Type / screen IV PPI.         DVT prophylaxis:  SCD's   Code Status:  Full code.      05/10/2022    5:28 PM  Advanced Directives  Does Patient Have a Medical Advance Directive? No    Family Communication:  None.  Emergency Contact: Contact Information  Name Relation Home Work Mobile   badillo,gilisa Daughter   [phone]       Disposition Plan:  Home.   Consults: None.   Admission status: Inpatient.    Unit / Expected LOS: Cardiac tele / 2 days   Gertha Calkin MD [facility]  6 PM- 2 AM. [phone]( Pager ) For questions regarding this patient please use [email or web address] to contact the current [facility] MD  You may also call [phone] to contact current Assigned [facility] Attending/Consulting MD for this patient.

## 2022-05-10 NOTE — Assessment & Plan Note (Addendum)
?   Ischemic heart disease.  We will schedule pt for echo and cardiology consult per AM team. Pt has moderate risk factors: Dyslipidemia/ obesity/ undelrying prediabetes or undiagnosed dm II. Cardiology consult per AM team for stress test.  Lipid panel/ TFT.

## 2022-05-11 ENCOUNTER — Other Ambulatory Visit: Payer: Self-pay

## 2022-05-11 ENCOUNTER — Inpatient Hospital Stay (HOSPITAL_COMMUNITY)
Admit: 2022-05-11 | Discharge: 2022-05-11 | Disposition: A | Discharge status: Home / Self Care | Payer: Medicaid Other | Attending: Internal Medicine | Admitting: Internal Medicine

## 2022-05-11 DIAGNOSIS — R9431 Abnormal electrocardiogram [ECG] [EKG]: Secondary | ICD-10-CM

## 2022-05-11 DIAGNOSIS — E611 Iron deficiency: Secondary | ICD-10-CM | POA: Diagnosis not present

## 2022-05-11 DIAGNOSIS — R0602 Shortness of breath: Secondary | ICD-10-CM | POA: Diagnosis not present

## 2022-05-11 LAB — CBC WITH DIFFERENTIAL/PLATELET
Abs Immature Granulocytes: 0.04 10*3/uL (ref 0.00–0.07)
Basophils Absolute: 0 10*3/uL (ref 0.0–0.1)
Basophils Relative: 0 %
Eosinophils Absolute: 0.1 10*3/uL (ref 0.0–0.5)
Eosinophils Relative: 2 %
HCT: 19 % — ABNORMAL LOW (ref 36.0–46.0)
Hemoglobin: 6.6 g/dL — ABNORMAL LOW (ref 12.0–15.0)
Immature Granulocytes: 1 %
Lymphocytes Relative: 33 %
Lymphs Abs: 1.1 10*3/uL (ref 0.7–4.0)
MCH: 42.3 pg — ABNORMAL HIGH (ref 26.0–34.0)
MCHC: 34.7 g/dL (ref 30.0–36.0)
MCV: 121.8 fL — ABNORMAL HIGH (ref 80.0–100.0)
Monocytes Absolute: 0.1 10*3/uL (ref 0.1–1.0)
Monocytes Relative: 4 %
Neutro Abs: 1.9 10*3/uL (ref 1.7–7.7)
Neutrophils Relative %: 60 %
Platelets: 85 10*3/uL — ABNORMAL LOW (ref 150–400)
RBC: 1.56 MIL/uL — ABNORMAL LOW (ref 3.87–5.11)
RDW: 19 % — ABNORMAL HIGH (ref 11.5–15.5)
Smear Review: DECREASED
WBC: 3.2 10*3/uL — ABNORMAL LOW (ref 4.0–10.5)
nRBC: 1.6 % — ABNORMAL HIGH (ref 0.0–0.2)

## 2022-05-11 LAB — ECHOCARDIOGRAM COMPLETE
AR max vel: 2.45 cm2
AV Peak grad: 10.1 mmHg
Ao pk vel: 1.59 m/s
Area-P 1/2: 5.31 cm2
Height: 63 in
S' Lateral: 2.8 cm
Weight: 3552 oz

## 2022-05-11 LAB — URINALYSIS, ROUTINE W REFLEX MICROSCOPIC
Bacteria, UA: NONE SEEN
Bilirubin Urine: NEGATIVE
Glucose, UA: NEGATIVE mg/dL
Ketones, ur: NEGATIVE mg/dL
Leukocytes,Ua: NEGATIVE
Nitrite: NEGATIVE
Protein, ur: NEGATIVE mg/dL
Specific Gravity, Urine: 1.024 (ref 1.005–1.030)
pH: 5 (ref 5.0–8.0)

## 2022-05-11 LAB — HEMOGLOBIN A1C
Hgb A1c MFr Bld: 5 % (ref 4.8–5.6)
Mean Plasma Glucose: 96.8 mg/dL

## 2022-05-11 LAB — HEPATIC FUNCTION PANEL
ALT: 25 U/L (ref 0–44)
AST: 36 U/L (ref 15–41)
Albumin: 3.3 g/dL — ABNORMAL LOW (ref 3.5–5.0)
Alkaline Phosphatase: 53 U/L (ref 38–126)
Bilirubin, Direct: 0.3 mg/dL — ABNORMAL HIGH (ref 0.0–0.2)
Indirect Bilirubin: 1.6 mg/dL — ABNORMAL HIGH (ref 0.3–0.9)
Total Bilirubin: 1.9 mg/dL — ABNORMAL HIGH (ref 0.3–1.2)
Total Protein: 6.3 g/dL — ABNORMAL LOW (ref 6.5–8.1)

## 2022-05-11 LAB — BPAM RBC: Unit Type and Rh: 7300

## 2022-05-11 LAB — BASIC METABOLIC PANEL
Anion gap: 6 (ref 5–15)
BUN: 9 mg/dL (ref 6–20)
CO2: 25 mmol/L (ref 22–32)
Calcium: 8.7 mg/dL — ABNORMAL LOW (ref 8.9–10.3)
Chloride: 107 mmol/L (ref 98–111)
Creatinine, Ser: 0.53 mg/dL (ref 0.44–1.00)
GFR, Estimated: >60 mL/min (ref >60)
Glucose, Bld: 131 mg/dL — ABNORMAL HIGH (ref 70–99)
Potassium: 3.7 mmol/L (ref 3.5–5.1)
Sodium: 138 mmol/L (ref 135–145)

## 2022-05-11 LAB — IRON AND TIBC
Iron: 245 ug/dL — ABNORMAL HIGH (ref 28–170)
Saturation Ratios: 84 % — ABNORMAL HIGH (ref 10.4–31.8)
TIBC: 293 ug/dL (ref 250–450)
UIBC: 48 ug/dL

## 2022-05-11 LAB — CBC
HCT: 18.9 % — ABNORMAL LOW (ref 36.0–46.0)
Hemoglobin: 6.6 g/dL — ABNORMAL LOW (ref 12.0–15.0)
MCH: 41.8 pg — ABNORMAL HIGH (ref 26.0–34.0)
MCHC: 34.9 g/dL (ref 30.0–36.0)
MCV: 119.6 fL — ABNORMAL HIGH (ref 80.0–100.0)
Platelets: 110 10*3/uL — ABNORMAL LOW (ref 150–400)
RBC: 1.58 MIL/uL — ABNORMAL LOW (ref 3.87–5.11)
RDW: 18 % — ABNORMAL HIGH (ref 11.5–15.5)
WBC: 3.2 10*3/uL — ABNORMAL LOW (ref 4.0–10.5)
nRBC: 1.5 % — ABNORMAL HIGH (ref 0.0–0.2)

## 2022-05-11 LAB — VITAMIN B12: Vitamin B-12: 1078 pg/mL — ABNORMAL HIGH (ref 180–914)

## 2022-05-11 LAB — TROPONIN I (HIGH SENSITIVITY): Troponin I (High Sensitivity): 2 ng/L (ref <18)

## 2022-05-11 LAB — FERRITIN: Ferritin: 107 ng/mL (ref 11–307)

## 2022-05-11 LAB — PREPARE RBC (CROSSMATCH)

## 2022-05-11 LAB — PATHOLOGIST SMEAR REVIEW

## 2022-05-11 LAB — HIV ANTIBODY (ROUTINE TESTING W REFLEX): HIV Screen 4th Generation wRfx: NONREACTIVE

## 2022-05-11 MED ORDER — SODIUM CHLORIDE 0.9 % IV SOLN: INTRAVENOUS | Status: DC

## 2022-05-11 MED ORDER — SODIUM CHLORIDE 0.9% IV SOLUTION
Freq: Once | INTRAVENOUS | Status: AC
  Filled 2022-05-11: qty 250

## 2022-05-11 MED ORDER — PEG 3350-KCL-NA BICARB-NACL 420 G PO SOLR
4000.0000 mL | Freq: Once | ORAL | Status: AC
  Administered 2022-05-11: 4000 mL via ORAL
  Filled 2022-05-11: qty 4000

## 2022-05-11 MED ORDER — OXYCODONE HCL 5 MG PO TABS
5.0000 mg | ORAL_TABLET | Freq: Once | ORAL | Status: AC
  Administered 2022-05-11: 5 mg via ORAL
  Filled 2022-05-11: qty 1

## 2022-05-11 MED ORDER — PERFLUTREN LIPID MICROSPHERE
1.0000 mL | INTRAVENOUS | Status: AC | PRN
  Administered 2022-05-11: 5 mL via INTRAVENOUS

## 2022-05-11 NOTE — ED Notes (Addendum)
Pt states that she is having chest pain. MD Masoud  notified via secure chat

## 2022-05-11 NOTE — Progress Notes (Signed)
CROSS COVER NOTE  NAME: Danielle [name] MRN: [id number] DOB : 04/13/1975 ATTENDING PHYSICIAN: Danielle Calkin, MD    Date of Service   05/11/2022   HPI/Events of Note   Report/Request *** 5/10 arm heaviness ongoing since admission unrelieved by tylenol, reported chest pain earlier today per RN note On Review of chart ***  Troponin x3 since admission all resulted at 2 ng/L, PLT 85, will avoid Toradol, Bedside eval*** HPI***  Interventions   Assessment/Plan: Repeat EKG Oxycodone X    *** professional thanks      To reach the provider On-Call:   7AM- 7PM see care teams to locate the attending and reach out to them via [email or web address] Password: [email or web address] 7PM-7AM contact night-coverage If you still have difficulty reaching the appropriate provider, please page the [facility] (Director on Call) for [facility] on amion for assistance  This document was prepared using Conservation officer, historic buildings and may include unintentional dictation errors.  Danielle Limbo DNP, MBA, FNP-BC, PMHNP-BC Nurse Practitioner [facility] Pager [phone]

## 2022-05-11 NOTE — Progress Notes (Signed)
Progress Note   Patient: Danielle [name] KKX:[id number] DOB: November 28, 1975 DOA: 05/10/2022     1 DOS: the patient was seen and examined on 05/11/2022   Brief hospital course: 47 year old female with PMH of severe microcytic iron deficiency anemia who was previously on regular IV Venofer infusions but subsequently stopped who presented to the ED on 4/13 with shortness of breath, dizziness, fatigue, and generalized weakness found to have hemoglobin of 6.6 g/dL.  She has a history of hemorrhoids. She denies any black tarry stools.  She denies any bulky or fatty stools to suggest malabsorption.  She has no history of gastric surgeries.  She has no family history of anemia, cancer, or bleeding disorders.  She uses Motrin daily for pain relief.    Given shortness of breath, CTA of chest was ordered which showed no evidence of a PE. Given dizziness, MRI of brain was ordered which showed no acute abnormality.  Assessment and Plan:  Acute Microcytic Anemia, symptomatic: - Hb is 6.6 g/dL.  Give 1 unit pRBC.  Repeat HH afterwards. - GI was consulted who recommend Endoscopy/Colonoscopy in am, appreciate assistance and recommendations.   - Keep NPO at midnight. - Counseled on NSAID cessation.  Iron Deficiency Anemia since 2021: TSH B12 and Folate wnl. - Iron Panel is pending. - Check Peripheral Smear.  Grade 1 Diastolic Heart Failure: Echo was reviewed.  Patient is euvolemic.   - Monitor.  Morbid Obesity: BMI is 39.33 kg/m2. - Counseled on weight loss through diet and lifestyle modifications.  Suspected OSA: Echo shows evidence of pulmonary hypertension. - Recommend sleep study outpatient.  DVT: Avoid blood thinners as patient is anemic. Diet: Cardiac Dispo: Floors Discharge Plan: Discharge is pending EGD/CLN workup.     Subjective:  [name] endorses, dizziness, shortness of breath with exertion, fatigue, weakness, and malaise. She has no current bleeding. She denies any chest  pain.  Physical Exam: Vitals:   05/11/22 0725 05/11/22 0900 05/11/22 1112 05/11/22 1200  BP:  100/64  (!) 109/59  Pulse:  97  92  Resp:  (!) 21  18  Temp: 98.6 F (37 C)  98.5 F (36.9 C)   TempSrc: Oral  Oral   SpO2:  98%  99%  Weight:      Height:       Physical Exam Constitutional:      General: She is in acute distress.     Appearance: She is obese.     Comments: Pale, weak, fatigued  HENT:     Head: Normocephalic and atraumatic.  Eyes:     Extraocular Movements: Extraocular movements intact.     Pupils: Pupils are equal, round, and reactive to light.  Neck:     Comments: No lymphadenopathy. Cardiovascular:     Rate and Rhythm: Normal rate and regular rhythm.     Pulses: Normal pulses.     Heart sounds: Normal heart sounds.  Pulmonary:     Breath sounds: Normal breath sounds.  Chest:     Chest wall: No tenderness.  Abdominal:     Palpations: Abdomen is soft.     Tenderness: There is no abdominal tenderness.     Comments: No hepatosplenomegaly  Skin:    General: Skin is warm and dry.     Capillary Refill: Capillary refill takes less than 2 seconds.     Comments: No jaundice.  Neurological:     General: No focal deficit present.  Psychiatric:  Mood and Affect: Mood normal.    Data Reviewed:  Lab Results  Component Value Date   WBC 3.2 (L) 05/11/2022   HGB 6.6 (L) 05/11/2022   HCT 18.9 (L) 05/11/2022   MCV 119.6 (H) 05/11/2022   PLT 110 (L) 05/11/2022   Last metabolic panel Lab Results  Component Value Date   GLUCOSE 131 (H) 05/11/2022   NA 138 05/11/2022   K 3.7 05/11/2022   CL 107 05/11/2022   CO2 25 05/11/2022   BUN 9 05/11/2022   CREATININE 0.53 05/11/2022   GFRNONAA >60 05/11/2022   CALCIUM 8.7 (L) 05/11/2022   PROT 6.3 (L) 05/11/2022   ALBUMIN 3.3 (L) 05/11/2022   BILITOT 1.9 (H) 05/11/2022   ALKPHOS 53 05/11/2022   AST 36 05/11/2022   ALT 25 05/11/2022   ANIONGAP 6 05/11/2022   ECHOCARDIOGRAM COMPLETE    ECHOCARDIOGRAM  REPORT       Patient Name:   Danielle [name] Date of Exam: 05/11/2022 Medical Rec #:  [id number]       Height:       63.0 in Accession #:    [id number]      Weight:       222.0 lb Date of Birth:  1975/11/01       BSA:          2.021 m Patient Age:    46 years        BP:           105/71 mmHg Patient Gender: F               HR:           103 bpm. Exam Location:  [facility]  Procedure: 2D Echo and Intracardiac Opacification Agent  Indications:     Abnormal ECG R94.31   History:         Patient has no prior history of Echocardiogram examinations.   Sonographer:     Overton Mam RDCS Referring Phys:  [id number] Eliezer Mccoy PATEL Diagnosing Phys: Chilton Si MD    Sonographer Comments: Technically difficult study due to poor echo windows and patient is obese. Image acquisition challenging due to patient body habitus and Image acquisition challenging due to respiratory motion. IMPRESSIONS   1. Left ventricular ejection fraction, by estimation, is 55 to 60%. The left ventricle has normal function. The left ventricle has no regional wall motion abnormalities. Left ventricular diastolic parameters are consistent with Grade I diastolic  dysfunction (impaired relaxation).  2. Right ventricular systolic function is normal. The right ventricular size is normal.  3. The mitral valve is normal in structure. No evidence of mitral valve regurgitation. No evidence of mitral stenosis.  4. The aortic valve is tricuspid. Aortic valve regurgitation is not visualized. No aortic stenosis is present.  5. The inferior vena cava is normal in size with greater than 50% respiratory variability, suggesting right atrial pressure of 3 mmHg.  FINDINGS  Left Ventricle: Left ventricular ejection fraction, by estimation, is 55 to 60%. The left ventricle has normal function. The left ventricle has no regional wall motion abnormalities. Definity contrast agent was given IV to delineate the left ventricular  endocardial  borders. The left ventricular internal cavity size was normal in size. There is no left ventricular hypertrophy. Left ventricular diastolic parameters are consistent with Grade I diastolic dysfunction (impaired relaxation).  Right Ventricle: The right ventricular size is normal. No increase in right ventricular wall thickness. Right ventricular systolic function is normal.  Progress Note   Patient: Danielle [name] KKX:[id number] DOB: November 28, 1975 DOA: 05/10/2022     1 DOS: the patient was seen and examined on 05/11/2022   Brief hospital course: 47 year old female with PMH of severe microcytic iron deficiency anemia who was previously on regular IV Venofer infusions but subsequently stopped who presented to the ED on 4/13 with shortness of breath, dizziness, fatigue, and generalized weakness found to have hemoglobin of 6.6 g/dL.  She has a history of hemorrhoids. She denies any black tarry stools.  She denies any bulky or fatty stools to suggest malabsorption.  She has no history of gastric surgeries.  She has no family history of anemia, cancer, or bleeding disorders.  She uses Motrin daily for pain relief.    Given shortness of breath, CTA of chest was ordered which showed no evidence of a PE. Given dizziness, MRI of brain was ordered which showed no acute abnormality.  Assessment and Plan:  Acute Microcytic Anemia, symptomatic: - Hb is 6.6 g/dL.  Give 1 unit pRBC.  Repeat HH afterwards. - GI was consulted who recommend Endoscopy/Colonoscopy in am, appreciate assistance and recommendations.   - Keep NPO at midnight. - Counseled on NSAID cessation.  Iron Deficiency Anemia since 2021: TSH B12 and Folate wnl. - Iron Panel is pending. - Check Peripheral Smear.  Grade 1 Diastolic Heart Failure: Echo was reviewed.  Patient is euvolemic.   - Monitor.  Morbid Obesity: BMI is 39.33 kg/m2. - Counseled on weight loss through diet and lifestyle modifications.  Suspected OSA: Echo shows evidence of pulmonary hypertension. - Recommend sleep study outpatient.  DVT: Avoid blood thinners as patient is anemic. Diet: Cardiac Dispo: Floors Discharge Plan: Discharge is pending EGD/CLN workup.     Subjective:  [name] endorses, dizziness, shortness of breath with exertion, fatigue, weakness, and malaise. She has no current bleeding. She denies any chest  pain.  Physical Exam: Vitals:   05/11/22 0725 05/11/22 0900 05/11/22 1112 05/11/22 1200  BP:  100/64  (!) 109/59  Pulse:  97  92  Resp:  (!) 21  18  Temp: 98.6 F (37 C)  98.5 F (36.9 C)   TempSrc: Oral  Oral   SpO2:  98%  99%  Weight:      Height:       Physical Exam Constitutional:      General: She is in acute distress.     Appearance: She is obese.     Comments: Pale, weak, fatigued  HENT:     Head: Normocephalic and atraumatic.  Eyes:     Extraocular Movements: Extraocular movements intact.     Pupils: Pupils are equal, round, and reactive to light.  Neck:     Comments: No lymphadenopathy. Cardiovascular:     Rate and Rhythm: Normal rate and regular rhythm.     Pulses: Normal pulses.     Heart sounds: Normal heart sounds.  Pulmonary:     Breath sounds: Normal breath sounds.  Chest:     Chest wall: No tenderness.  Abdominal:     Palpations: Abdomen is soft.     Tenderness: There is no abdominal tenderness.     Comments: No hepatosplenomegaly  Skin:    General: Skin is warm and dry.     Capillary Refill: Capillary refill takes less than 2 seconds.     Comments: No jaundice.  Neurological:     General: No focal deficit present.  Psychiatric:  Mood and Affect: Mood normal.    Data Reviewed:  Lab Results  Component Value Date   WBC 3.2 (L) 05/11/2022   HGB 6.6 (L) 05/11/2022   HCT 18.9 (L) 05/11/2022   MCV 119.6 (H) 05/11/2022   PLT 110 (L) 05/11/2022   Last metabolic panel Lab Results  Component Value Date   GLUCOSE 131 (H) 05/11/2022   NA 138 05/11/2022   K 3.7 05/11/2022   CL 107 05/11/2022   CO2 25 05/11/2022   BUN 9 05/11/2022   CREATININE 0.53 05/11/2022   GFRNONAA >60 05/11/2022   CALCIUM 8.7 (L) 05/11/2022   PROT 6.3 (L) 05/11/2022   ALBUMIN 3.3 (L) 05/11/2022   BILITOT 1.9 (H) 05/11/2022   ALKPHOS 53 05/11/2022   AST 36 05/11/2022   ALT 25 05/11/2022   ANIONGAP 6 05/11/2022   ECHOCARDIOGRAM COMPLETE    ECHOCARDIOGRAM  REPORT       Patient Name:   Danielle [name] Date of Exam: 05/11/2022 Medical Rec #:  [id number]       Height:       63.0 in Accession #:    [id number]      Weight:       222.0 lb Date of Birth:  1975/11/01       BSA:          2.021 m Patient Age:    46 years        BP:           105/71 mmHg Patient Gender: F               HR:           103 bpm. Exam Location:  [facility]  Procedure: 2D Echo and Intracardiac Opacification Agent  Indications:     Abnormal ECG R94.31   History:         Patient has no prior history of Echocardiogram examinations.   Sonographer:     Overton Mam RDCS Referring Phys:  [id number] Eliezer Mccoy PATEL Diagnosing Phys: Chilton Si MD    Sonographer Comments: Technically difficult study due to poor echo windows and patient is obese. Image acquisition challenging due to patient body habitus and Image acquisition challenging due to respiratory motion. IMPRESSIONS   1. Left ventricular ejection fraction, by estimation, is 55 to 60%. The left ventricle has normal function. The left ventricle has no regional wall motion abnormalities. Left ventricular diastolic parameters are consistent with Grade I diastolic  dysfunction (impaired relaxation).  2. Right ventricular systolic function is normal. The right ventricular size is normal.  3. The mitral valve is normal in structure. No evidence of mitral valve regurgitation. No evidence of mitral stenosis.  4. The aortic valve is tricuspid. Aortic valve regurgitation is not visualized. No aortic stenosis is present.  5. The inferior vena cava is normal in size with greater than 50% respiratory variability, suggesting right atrial pressure of 3 mmHg.  FINDINGS  Left Ventricle: Left ventricular ejection fraction, by estimation, is 55 to 60%. The left ventricle has normal function. The left ventricle has no regional wall motion abnormalities. Definity contrast agent was given IV to delineate the left ventricular  endocardial  borders. The left ventricular internal cavity size was normal in size. There is no left ventricular hypertrophy. Left ventricular diastolic parameters are consistent with Grade I diastolic dysfunction (impaired relaxation).  Right Ventricle: The right ventricular size is normal. No increase in right ventricular wall thickness. Right ventricular systolic function is normal.

## 2022-05-11 NOTE — Progress Notes (Signed)
  Echocardiogram 2D Echocardiogram has been performed. Definity IV ultrasound imaging agent used on this study.  Danielle [name] 05/11/2022, 10:33 AM

## 2022-05-11 NOTE — Consult Note (Signed)
Danielle [name] , MD [address], [address], [place], [place], [address] [address], [place], [place], [address] Phone: [phone]  Fax: [phone]  Consultation  Referring Provider:     Dr Juel Burrow Primary Care Physician:  Armando Gang, FNP Primary Gastroenterologist:  Dr. Norma Fredrickson          Reason for Consultation:  Anemia   Date of Admission:  05/10/2022 Date of Consultation:  05/11/2022         HPI:   Danielle [name] is a 47 y.o. female presented to the ER with shortness of breath and dizziness .  She follows with Dr Norma Fredrickson as an outpatient . She has had a microcytic iron deficiency anemia for over 2 years . Unclear what level of work up perfromed. No endoscopy notes available .  Was seeing Dr Donneta Romberg for IV iron . But didn't follow up since 11/2020.  On admission Hb 6.6 grams with mcv 119, 1 year back Hb 9.7 grams.  Denies any nosebleeds blood in urine or vaginal bleeds.  She states that she knows she has hemorrhoids and feels occasionally blood on the tissue paper.  She has been taking Motrin on a daily basis for pain on her index finger after an injury and she has been taking Motrin on and off over the years long-term as well.  Denies any abdominal pain or any other complaints.  Past Medical History:  Diagnosis Date   Anemia    Depression     Past Surgical History:  Procedure Laterality Date   ABDOMINAL HYSTERECTOMY     TONSILLECTOMY      Prior to Admission medications   Medication Sig Start Date End Date Taking? Authorizing Provider  Dextromethorphan-Bupropion (AUVELITY PO) Take 1 tablet by mouth as directed.   Yes [provider]  metFORMIN (GLUCOPHAGE) 500 MG tablet Take 500 mg by mouth 2 (two) times daily with a meal.   Yes [provider]    Family History  Problem Relation Age of Onset   Breast cancer Paternal Aunt      Social History   Tobacco Use   Smoking status: Every Day    Packs/day: 0.50    Years: 25.00     Additional pack years: 0.00    Total pack years: 12.50    Types: Cigarettes   Smokeless tobacco: Never  Substance Use Topics   Alcohol use: Not Currently   Drug use: Never    Allergies as of 05/10/2022   (No Known Allergies)    Review of Systems:    All systems reviewed and negative except where noted in HPI.   Physical Exam:  Vital signs in last 24 hours: Temp:  [98.6 F (37 C)] 98.6 F (37 C) (04/14 0725) Pulse Rate:  [85-118] 97 (04/14 0900) Resp:  [12-29] 21 (04/14 0900) BP: (89-127)/(46-73) 100/64 (04/14 0900) SpO2:  [98 %-100 %] 98 % (04/14 0900) Weight:  [100.7 kg] 100.7 kg (04/13 1727)   General:   Pleasant, cooperative in NAD Head:  Normocephalic and atraumatic. Eyes:   No icterus.   Conjunctiva pink. PERRLA. Ears:  Normal auditory acuity. Neck:  Supple; no masses or thyroidomegaly Lungs: Respirations even and unlabored. Lungs clear to auscultation bilaterally.   No wheezes, crackles, or rhonchi.  Heart:  Regular rate and rhythm;  Without murmur, clicks, rubs or gallops Abdomen:  Soft, nondistended, nontender. Normal bowel sounds. No appreciable masses or hepatomegaly.  No rebound or guarding.  Neurologic:  Alert  and oriented x3;  grossly normal neurologically. Skin:  Intact without significant lesions or rashes. Cervical Nodes:  No significant cervical adenopathy. Psych:  Alert and cooperative. Normal affect.  LAB RESULTS: Recent Labs    05/10/22 1728 05/11/22 0901  WBC 5.5 3.2*  HGB 7.9* 6.6*  HCT 22.5* 18.9*  PLT 121* 110*   BMET Recent Labs    05/10/22 1728 05/11/22 0901  NA 137 138  K 3.4* 3.7  CL 104 107  CO2 22 25  GLUCOSE 184* 131*  BUN 13 9  CREATININE 0.68 0.53  CALCIUM 9.1 8.7*   LFT Recent Labs    05/11/22 0901  PROT 6.3*  ALBUMIN 3.3*  AST 36  ALT 25  ALKPHOS 53  BILITOT 1.9*  BILIDIR 0.3*  IBILI 1.6*   PT/INR Recent Labs    05/10/22 1730  LABPROT 13.8  INR 1.1    STUDIES: MR BRAIN WO CONTRAST  Result Date:  05/10/2022 CLINICAL DATA:  Initial evaluation for dizziness. EXAM: MRI HEAD WITHOUT CONTRAST TECHNIQUE: Multiplanar, multiecho pulse sequences of the brain and surrounding structures were obtained without intravenous contrast. COMPARISON:  CT from 05/26/2008. FINDINGS: Brain: Cerebral volume within normal limits. No significant cerebral white matter disease or other focal parenchymal signal abnormality. No evidence for acute or subacute ischemia. Gray-white matter differentiation maintained. No areas of chronic cortical infarction. No acute or chronic intracranial blood products. No mass lesion, midline shift or mass effect. No hydrocephalus or extra-axial fluid collection. Pituitary gland and suprasellar region within normal limits. Vascular: Major intracranial vascular flow voids are maintained. Skull and upper cervical spine: Craniocervical junction normal. Diffusely decreased T1 signal intensity seen throughout the visualized bone marrow, nonspecific, but most commonly related to anemia, smoking, or obesity. No scalp soft tissue abnormality. Sinuses/Orbits: Globes and orbital soft tissues within normal limits. Mild scattered mucosal thickening noted about the ethmoidal air cells. Paranasal sinuses are otherwise largely clear. Trace bilateral mastoid effusions noted, bowel significance. Other: None. IMPRESSION: 1. Normal brain MRI. No acute intracranial abnormality identified. 2. Decreased T1 signal intensity throughout the visualized bone marrow, nonspecific, but most commonly seen with anemia, smoking, or obesity. Correlation with laboratory values and history suggested. Electronically Signed   By: Rise Mu M.D.   On: 05/10/2022 21:45   CT Angio Chest PE W and/or Wo Contrast  Result Date: 05/10/2022 CLINICAL DATA:  Pulmonary embolism (PE) suspected, high prob Shortness of breath. EXAM: CT ANGIOGRAPHY CHEST WITH CONTRAST TECHNIQUE: Multidetector CT imaging of the chest was performed using the  standard protocol during bolus administration of intravenous contrast. Multiplanar CT image reconstructions and MIPs were obtained to evaluate the vascular anatomy. RADIATION DOSE REDUCTION: This exam was performed according to the departmental dose-optimization program which includes automated exposure control, adjustment of the mA and/or kV according to patient size and/or use of iterative reconstruction technique. CONTRAST:  75mL OMNIPAQUE IOHEXOL 350 MG/ML SOLN COMPARISON:  Radiograph earlier today. Chest CT 05/31/2007 FINDINGS: Cardiovascular: There are no filling defects within the pulmonary arteries to suggest pulmonary embolus. Main pulmonary artery is dilated at 3.9 cm. The thoracic aorta is normal in caliber. No aortic dissection or acute aortic findings. Heart is normal in size. No pericardial effusion. Mediastinum/Nodes: No mediastinal or hilar adenopathy. No visualized thyroid nodule. Unremarkable esophagus. Lungs/Pleura: Mild bronchial thickening and heterogeneous pulmonary parenchyma. Linear subsegmental opacity in the right upper lobe and left lower lobe. Previous 4 mm left lower lobe nodule is not seen. There is no pleural effusion. Upper Abdomen: No acute  upper abdominal findings. Musculoskeletal: There are no acute or suspicious osseous abnormalities. Review of the MIP images confirms the above findings. IMPRESSION: 1. No pulmonary embolus. 2. Mild bronchial thickening and heterogeneous pulmonary parenchyma, can be seen with small airways disease. Linear subsegmental opacity in the right upper lobe and left lower lobe may be atelectasis or scarring. 3. Dilatation of the main pulmonary artery suggesting pulmonary arterial hypertension. Electronically Signed   By: Narda Rutherford M.D.   On: 05/10/2022 19:14   DG Chest 2 View  Result Date: 05/10/2022 CLINICAL DATA:  Shortness of breath EXAM: CHEST - 2 VIEW COMPARISON:  Chest x-ray May 31, 2007 FINDINGS: The cardiomediastinal silhouette is  unchanged in contour. No focal pulmonary opacity. No pleural effusion or pneumothorax. The visualized upper abdomen is unremarkable. No acute osseous abnormality. IMPRESSION: No active cardiopulmonary disease. Electronically Signed   By: Jacob Moores M.D.   On: 05/10/2022 17:52      Impression / Plan:   JENENE KAUFFMANN is a 47 y.o. y/o female admitted with shortness of breath and found to have severe microcytic anemia .B12 normal . No iron studies checked, H/o severe iron deficiency back in 2021 , had seen Dr Norma Fredrickson but doesn't appear if she was followed up or completed work up. Very likely anemia secondary to NSAID use.  No overt blood loss reported.   Plan  Monitor CBC and transfuse as needed IV iron  Celiac serology , EGD+colonoscopy with Dr Servando Snare tomorrow and if negative may require capsule study of the small bowel as an outpatient 4.  Stop all NSAID use  I have discussed alternative options, risks & benefits,  which include, but are not limited to, bleeding, infection, perforation,respiratory complication & drug reaction.  The patient agrees with this plan & written consent will be obtained.     Thank you for involving me in the care of this patient.      LOS: 1 day   Danielle Mood, MD  05/11/2022, 10:49 AM

## 2022-05-12 ENCOUNTER — Encounter: Payer: Self-pay | Admitting: Internal Medicine

## 2022-05-12 ENCOUNTER — Inpatient Hospital Stay: Payer: Medicaid Other | Admitting: Anesthesiology

## 2022-05-12 ENCOUNTER — Encounter
Admission: EM | Disposition: A | Discharge status: Home / Self Care | Payer: Self-pay | Source: Home / Self Care | Attending: Internal Medicine

## 2022-05-12 DIAGNOSIS — E611 Iron deficiency: Secondary | ICD-10-CM | POA: Diagnosis not present

## 2022-05-12 DIAGNOSIS — D539 Nutritional anemia, unspecified: Secondary | ICD-10-CM | POA: Diagnosis not present

## 2022-05-12 DIAGNOSIS — D649 Anemia, unspecified: Secondary | ICD-10-CM | POA: Diagnosis not present

## 2022-05-12 HISTORY — PX: COLONOSCOPY WITH PROPOFOL: SHX5780

## 2022-05-12 HISTORY — PX: ESOPHAGOGASTRODUODENOSCOPY (EGD) WITH PROPOFOL: SHX5813

## 2022-05-12 LAB — BPAM RBC
Blood Product Expiration Date: 202405032359
Blood Product Expiration Date: 202405092359

## 2022-05-12 LAB — BASIC METABOLIC PANEL
Anion gap: 8 (ref 5–15)
BUN: 12 mg/dL (ref 6–20)
CO2: 26 mmol/L (ref 22–32)
Calcium: 8.9 mg/dL (ref 8.9–10.3)
Chloride: 105 mmol/L (ref 98–111)
Creatinine, Ser: 0.59 mg/dL (ref 0.44–1.00)
GFR, Estimated: >60 mL/min (ref >60)
Glucose, Bld: 107 mg/dL — ABNORMAL HIGH (ref 70–99)
Potassium: 4 mmol/L (ref 3.5–5.1)
Sodium: 139 mmol/L (ref 135–145)

## 2022-05-12 LAB — CBC
HCT: 22.2 % — ABNORMAL LOW (ref 36.0–46.0)
Hemoglobin: 7.7 g/dL — ABNORMAL LOW (ref 12.0–15.0)
MCH: 37.9 pg — ABNORMAL HIGH (ref 26.0–34.0)
MCHC: 34.7 g/dL (ref 30.0–36.0)
MCV: 109.4 fL — ABNORMAL HIGH (ref 80.0–100.0)
Platelets: 73 10*3/uL — ABNORMAL LOW (ref 150–400)
RBC: 2.03 MIL/uL — ABNORMAL LOW (ref 3.87–5.11)
WBC: 3.2 10*3/uL — ABNORMAL LOW (ref 4.0–10.5)
nRBC: 1.3 % — ABNORMAL HIGH (ref 0.0–0.2)

## 2022-05-12 LAB — GLUCOSE, CAPILLARY: Glucose-Capillary: 85 mg/dL (ref 70–99)

## 2022-05-12 SURGERY — ESOPHAGOGASTRODUODENOSCOPY (EGD) WITH PROPOFOL
Anesthesia: General

## 2022-05-12 MED ORDER — ADULT MULTIVITAMIN W/MINERALS CH: 1.0000 | ORAL_TABLET | Freq: Every day | ORAL | Status: DC

## 2022-05-12 MED ORDER — PROPOFOL 500 MG/50ML IV EMUL
INTRAVENOUS | Status: DC | PRN
  Administered 2022-05-12: 200 ug/kg/min via INTRAVENOUS

## 2022-05-12 MED ORDER — LIDOCAINE HCL (CARDIAC) PF 100 MG/5ML IV SOSY
PREFILLED_SYRINGE | INTRAVENOUS | Status: DC | PRN
  Administered 2022-05-12: 50 mg via INTRAVENOUS

## 2022-05-12 MED ORDER — SODIUM CHLORIDE 0.9 % IV SOLN: INTRAVENOUS | Status: DC

## 2022-05-12 MED ORDER — ENSURE ENLIVE PO LIQD: 237.0000 mL | Freq: Two times a day (BID) | ORAL | Status: DC

## 2022-05-12 MED ORDER — PROPOFOL 10 MG/ML IV BOLUS
INTRAVENOUS | Status: DC | PRN
  Administered 2022-05-12: 60 mg via INTRAVENOUS

## 2022-05-12 MED ORDER — PHENYLEPHRINE 80 MCG/ML (10ML) SYRINGE FOR IV PUSH (FOR BLOOD PRESSURE SUPPORT)
PREFILLED_SYRINGE | INTRAVENOUS | Status: DC | PRN
  Administered 2022-05-12: 80 ug via INTRAVENOUS
  Administered 2022-05-12: 120 ug via INTRAVENOUS

## 2022-05-12 NOTE — Progress Notes (Signed)
Initial Nutrition Assessment  DOCUMENTATION CODES:   Obesity unspecified  INTERVENTION:   -Liberalize diet to 2 gram sodium -MVI with minerals daily -Ensure Enlive po BID, each supplement provides 350 kcal and 20 grams of protein  NUTRITION DIAGNOSIS:   Inadequate oral intake related to altered GI function, decreased appetite as evidenced by per patient/family report.  GOAL:   Patient will meet greater than or equal to 90% of their needs  MONITOR:   PO intake, Supplement acceptance  REASON FOR ASSESSMENT:   Consult Assessment of nutrition requirement/status  ASSESSMENT:   Pt with PMH of anemia and depression admitted with shortness of breath and chest pain.  Pt admitted with acute microcytic anemia.   Reviewed I/O's: +768 ml x 24 hours and +1.3 L since admission  Per GI notes, pt with 2 year history of microcytic iron deficiency anemia. She was previously receiving iron transfusions, however, has not followed up since 11/2020. Plan for celiac serology and EGD+ colonoscopy today. If this is negative may require outpatient capsule study of small bowel.   Spoke with pt, who reports feeling sleepy at time of visit. She was consuming bowel prep at time of visit. Pt shares that her intake fluctuates at baseline. On days when she is feeling poorly, she will go all day without eating. When she is feeling well, pt usually consumes 2 meals per day (Breakfast: eggs and toast; Dinner: rice and beans). Pt reports her diet is rich in carbohydrates and protein and eats very little fruits and vegetables.   Reviewed wt hx; per [facility] encounter on 01/11/20, wt was 215#. No wt loss noted. Pt reports that her weight fluctuates at baseline. She reports she lost about 20# about 8 months ago, but quickly gained it back. Pt denies any changes in functional status or mobility, but reports she gets weak easily secondary to shortness of breath. Pt also with mild edema, which may be masking true  weight loss as well as fat and muscle depletions.   Discussed importance of good meal and supplement intake to promote healing. Pt amenable to supplements.   Lab Results  Component Value Date   HGBA1C 5.0 05/10/2022   PTA DM medications are 500 mg metformin BID.   Labs reviewed: CBGS: 82 (inpatient orders for glycemic control are none).  NUTRITION - FOCUSED PHYSICAL EXAM:  Flowsheet Row Most Recent Value  Orbital Region No depletion  Upper Arm Region No depletion  Thoracic and Lumbar Region No depletion  Buccal Region No depletion  Temple Region No depletion  Clavicle Bone Region No depletion  Clavicle and Acromion Bone Region No depletion  Scapular Bone Region No depletion  Dorsal Hand No depletion  Patellar Region No depletion  Anterior Thigh Region No depletion  Posterior Calf Region No depletion  Edema (RD Assessment) Mild  Hair Reviewed  Eyes Reviewed  Mouth Reviewed  Skin Reviewed  Nails Reviewed       Diet Order:   Diet Order             Diet 2 gram sodium Fluid consistency: Thin  Diet effective now                   EDUCATION NEEDS:   Education needs have been addressed  Skin:  Skin Assessment: Reviewed RN Assessment  Last BM:  05/12/22 (type 7)  Height:   Ht Readings from Last 1 Encounters:  05/10/22  (1.6 m)    Weight:   Wt Readings from Last  1 Encounters:  05/10/22 100.7 kg    Ideal Body Weight:  52.3 kg  BMI:  Body mass index is 39.33 kg/m.  Estimated Nutritional Needs:   Kcal:  1800-2000  Protein:  105-120 grams  Fluid:  > 1.8 L    Levada Schilling, RD, LDN, CDCES Registered Dietitian II Certified Diabetes Care and Education Specialist Please refer to [facility] for RD and/or RD on-call/weekend/after hours pager

## 2022-05-12 NOTE — Transfer of Care (Signed)
Immediate Anesthesia Transfer of Care Note  Patient: Danielle [name]  Procedure(s) Performed: ESOPHAGOGASTRODUODENOSCOPY (EGD) WITH PROPOFOL COLONOSCOPY WITH PROPOFOL  Patient Location: Endoscopy Unit  Anesthesia Type:General  Level of Consciousness: drowsy  Airway & Oxygen Therapy: Patient Spontanous Breathing  Post-op Assessment: Report given to RN and Post -op Vital signs reviewed and stable  Post vital signs: Reviewed and stable  Last Vitals:  Vitals Value Taken Time  BP 92/39 05/12/22 1359  Temp 35.9 C 05/12/22 1358  Pulse 87 05/12/22 1359  Resp 15 05/12/22 1359  SpO2 99 % 05/12/22 1359  Vitals shown include unvalidated device data.  Last Pain:  Vitals:   05/12/22 1358  TempSrc: Temporal  PainSc: Asleep         Complications: No notable events documented.

## 2022-05-12 NOTE — Op Note (Signed)
[facility] Gastroenterology Patient Name: Danielle [name] Procedure Date: 05/12/2022 12:43 PM MRN: [id number] Account #: [id number] Date of Birth: Apr 30, 1975 Admit Type: Inpatient Age: 47 Room: [facility] ENDO ROOM 4 Gender: Female Note Status: Finalized Instrument Name: Prentice Docker [id number] Procedure:             Colonoscopy Indications:           Unexplained iron deficiency anemia Providers:             Midge Minium MD, MD Referring MD:          Fernand Parkins. Clint Guy (Referring MD) Medicines:             Propofol per Anesthesia Complications:         No immediate complications. Procedure:             Pre-Anesthesia Assessment:                        - Prior to the procedure, a History and Physical was                         performed, and patient medications and allergies were                         reviewed. The patient's tolerance of previous                         anesthesia was also reviewed. The risks and benefits                         of the procedure and the sedation options and risks                         were discussed with the patient. All questions were                         answered, and informed consent was obtained. Prior                         Anticoagulants: The patient has taken no anticoagulant                         or antiplatelet agents. ASA Grade Assessment: II - A                         patient with mild systemic disease. After reviewing                         the risks and benefits, the patient was deemed in                         satisfactory condition to undergo the procedure.                        After obtaining informed consent, the colonoscope was                         passed under direct vision. Throughout the procedure,  the patient's blood pressure, pulse, and oxygen                         saturations were monitored continuously. The                         Colonoscope was introduced through the anus  and                         advanced to the the cecum, identified by appendiceal                         orifice and ileocecal valve. The colonoscopy was                         performed without difficulty. The patient tolerated                         the procedure well. The quality of the bowel                         preparation was poor. Findings:      The perianal and digital rectal examinations were normal.      A moderate amount of stool was found in the entire colon. Impression:            - Preparation of the colon was poor.                        - Stool in the entire examined colon.                        - No specimens collected. Recommendation:        - Return patient to hospital ward for ongoing care.                        - Resume previous diet.                        - Continue present medications.                        - Look for other causes of this patients anemia Procedure Code(s):     --- Professional ---                        [phone], Colonoscopy, flexible; diagnostic, including                         collection of specimen(s) by brushing or washing, when                         performed (separate procedure) Diagnosis Code(s):     --- Professional ---                        D50.9, Iron deficiency anemia, unspecified CPT copyright 2022 American Medical Association. All rights reserved. The codes documented in this report are preliminary and upon coder review may  be revised to meet current compliance requirements. Midge Minium MD, MD 05/12/2022 1:56:35 PM This  report has been signed electronically. Number of Addenda: 0 Note Initiated On: 05/12/2022 12:43 PM Scope Withdrawal Time: 0 hours 7 minutes 12 seconds  Total Procedure Duration: 0 hours 11 minutes 3 seconds  Estimated Blood Loss:  Estimated blood loss: none.      [facility]

## 2022-05-12 NOTE — Op Note (Signed)
[facility] Gastroenterology Patient Name: Danielle [name] Procedure Date: 05/12/2022 12:44 PM MRN: [id number] Account #: [id number] Date of Birth: [birth year] Admit Type: Inpatient Age: 47 Room: [facility] ENDO ROOM 4 Gender: Female Note Status: Finalized Instrument Name: Patton Salles Endoscope [id number] Procedure:             Upper GI endoscopy Indications:           Unexplained iron deficiency anemia Providers:             Midge Minium MD, MD Referring MD:          Fernand Parkins. Clint Guy (Referring MD) Medicines:             Propofol per Anesthesia Complications:         No immediate complications. Procedure:             Pre-Anesthesia Assessment:                        - Prior to the procedure, a History and Physical was                         performed, and patient medications and allergies were                         reviewed. The patient's tolerance of previous                         anesthesia was also reviewed. The risks and benefits                         of the procedure and the sedation options and risks                         were discussed with the patient. All questions were                         answered, and informed consent was obtained. Prior                         Anticoagulants: The patient has taken no anticoagulant                         or antiplatelet agents. ASA Grade Assessment: II - A                         patient with mild systemic disease. After reviewing                         the risks and benefits, the patient was deemed in                         satisfactory condition to undergo the procedure.                        After obtaining informed consent, the endoscope was                         passed under direct vision. Throughout the procedure,  the patient's blood pressure, pulse, and oxygen                         saturations were monitored continuously. The Endoscope                         was introduced through the  mouth, and advanced to the                         second part of duodenum. The upper GI endoscopy was                         accomplished without difficulty. The patient tolerated                         the procedure well. Findings:      The esophagus was normal.      The stomach was normal.      The examined duodenum was normal. Impression:            - Normal esophagus.                        - Normal stomach.                        - Normal examined duodenum.                        - No specimens collected. Recommendation:        - Return patient to hospital ward for ongoing care.                        - Resume previous diet.                        - Perform a colonoscopy today. Procedure Code(s):     --- Professional ---                        [phone], Esophagogastroduodenoscopy, flexible,                         transoral; diagnostic, including collection of                         specimen(s) by brushing or washing, when performed                         (separate procedure) Diagnosis Code(s):     --- Professional ---                        D50.9, Iron deficiency anemia, unspecified CPT copyright 2022 American Medical Association. All rights reserved. The codes documented in this report are preliminary and upon coder review may  be revised to meet current compliance requirements. Midge Minium MD, MD 05/12/2022 1:41:27 PM This report has been signed electronically. Number of Addenda: 0 Note Initiated On: 05/12/2022 12:44 PM Estimated Blood Loss:  Estimated blood loss: none.      [facility]

## 2022-05-12 NOTE — Anesthesia Postprocedure Evaluation (Signed)
Anesthesia Post Note  Patient: Danielle [name]  Procedure(s) Performed: ESOPHAGOGASTRODUODENOSCOPY (EGD) WITH PROPOFOL COLONOSCOPY WITH PROPOFOL  Patient location during evaluation: Endoscopy Anesthesia Type: General Level of consciousness: awake and alert Pain management: pain level controlled Vital Signs Assessment: post-procedure vital signs reviewed and stable Respiratory status: spontaneous breathing, nonlabored ventilation, respiratory function stable and patient connected to nasal cannula oxygen Cardiovascular status: blood pressure returned to baseline and stable Postop Assessment: no apparent nausea or vomiting Anesthetic complications: no   No notable events documented.   Last Vitals:  Vitals:   05/12/22 1257 05/12/22 1358  BP: 116/69   Pulse: 89 84  Resp: 18 18  Temp: (!) 35.9 C (!) 35.9 C  SpO2: 100% 99%    Last Pain:  Vitals:   05/12/22 1358  TempSrc: Temporal  PainSc: Asleep                 Louie Boston

## 2022-05-12 NOTE — Anesthesia Procedure Notes (Addendum)
Procedure Name: MAC Date/Time: 05/12/2022 1:30 PM  Performed by: Hezzie Bump, CRNAPre-anesthesia Checklist: Patient identified, Emergency Drugs available, Suction available and Patient being monitored Patient Re-evaluated:Patient Re-evaluated prior to induction Oxygen Delivery Method: Simple face mask Induction Type: IV induction Placement Confirmation: positive ETCO2

## 2022-05-12 NOTE — Anesthesia Preprocedure Evaluation (Signed)
Anesthesia Evaluation  Patient identified by MRN, date of birth, ID band Patient awake    Reviewed: Allergy & Precautions, NPO status , Patient's Chart, lab work & pertinent test results  History of Anesthesia Complications Negative for: history of anesthetic complications  Airway Mallampati: III  TM Distance: >3 FB Neck ROM: full    Dental no notable dental hx.    Pulmonary neg pulmonary ROS, Current Smoker   Pulmonary exam normal        Cardiovascular negative cardio ROS Normal cardiovascular exam  IMPRESSIONS     1. Left ventricular ejection fraction, by estimation, is 55 to 60%. The  left ventricle has normal function. The left ventricle has no regional  wall motion abnormalities. Left ventricular diastolic parameters are  consistent with Grade I diastolic  dysfunction (impaired relaxation).   2. Right ventricular systolic function is normal. The right ventricular  size is normal.   3. The mitral valve is normal in structure. No evidence of mitral valve  regurgitation. No evidence of mitral stenosis.   4. The aortic valve is tricuspid. Aortic valve regurgitation is not  visualized. No aortic stenosis is present.   5. The inferior vena cava is normal in size with greater than 50%  respiratory variability, suggesting right atrial pressure of 3 mmHg.      Neuro/Psych negative neurological ROS  negative psych ROS   GI/Hepatic negative GI ROS, Neg liver ROS,,,  Endo/Other    Morbid obesity  Renal/GU negative Renal ROS  negative genitourinary   Musculoskeletal   Abdominal   Peds  Hematology  (+) Blood dyscrasia, anemia   Anesthesia Other Findings Past Medical History: No date: Anemia No date: Depression  Past Surgical History: No date: ABDOMINAL HYSTERECTOMY No date: TONSILLECTOMY  BMI    Body Mass Index: 39.33 kg/m      Reproductive/Obstetrics negative OB ROS                               Anesthesia Physical Anesthesia Plan  ASA: 3  Anesthesia Plan: General   Post-op Pain Management: Minimal or no pain anticipated   Induction: Intravenous  PONV Risk Score and Plan: Propofol infusion and TIVA  Airway Management Planned: Natural Airway and Nasal Cannula  Additional Equipment:   Intra-op Plan:   Post-operative Plan:   Informed Consent: I have reviewed the patients History and Physical, chart, labs and discussed the procedure including the risks, benefits and alternatives for the proposed anesthesia with the patient or authorized representative who has indicated his/her understanding and acceptance.     Dental Advisory Given  Plan Discussed with: Anesthesiologist, CRNA and Surgeon  Anesthesia Plan Comments: (Patient consented for risks of anesthesia including but not limited to:  - adverse reactions to medications - risk of airway placement if required - damage to eyes, teeth, lips or other oral mucosa - nerve damage due to positioning  - sore throat or hoarseness - Damage to heart, brain, nerves, lungs, other parts of body or loss of life  Patient voiced understanding.)         Anesthesia Quick Evaluation

## 2022-05-12 NOTE — Discharge Summary (Signed)
Physician Discharge Summary   Patient: Danielle [name] MRN: [id number] DOB: 01-19-1976  Admit date:     05/10/2022  Discharge date: 05/12/22  Discharge Physician: Aslan Montagna   PCP: Armando Gang, FNP   Recommendations at discharge:    Follow up with hematology as an outpatient  Discharge Diagnoses: Principal Problem:   Symptomatic anemia Active Problems:   Tobacco abuse   Macrocytic anemia   Obesity (BMI 30-39.9)   Pulmonary HTN  Resolved Problems:   * No resolved hospital problems. *  Hospital Course: Danielle [name] is a 47 y.o. female reports no major medical history other than depression, anemia, and a tubal ligation about 13 years ago as well as been told by her physician that she started menopause about a year ago   She has been having some shortness of breath for about 2 weeks.  It progressed and now is experiencing associated left-sided chest pain starting just prior to arrival to the ER.  Is located up in the left upper chest sharp and somewhat hard to describe.  She reports that she does not have any history of bleeding or vaginal bleeding, but occasionally has seen small amounts of blood in her stool off-and-on.   She does not have a history of blood clots.   She has been feeling lightheaded and when she walks she is feel short of breath and like she might pass out at times for about 2 weeks but delayed seeking medical treatment until her daughter was able to go to the prom, which she reports the daughter went to this evening   Currently reports feeling very scared because of the shortness of breath she has been experiencing.  Of note she also appears quite anxious,     Assessment and Plan:  Symptomatic macrocytic Anemia: - Hb was 6.6 g/dL. Patient received 1 unit pRBC.  - GI was consulted who recommend Endoscopy/Colonoscopy  - Patient is status post upper and lower endoscopy which was negative - Patient will be discharged home to follow-up with  hematology as an outpatient. .   Morbid Obesity: BMI is 39.33 kg/m2. - Counseled on weight loss through diet and lifestyle modifications.    Suspected OSA: Echo shows evidence of pulmonary hypertension. - Recommend sleep study outpatient.   Diastolic dysfunction CHF Patient is euvolemic        Consultants: Gastroenterology Procedures performed: Upper and lower endoscopy  Disposition: Home Diet recommendation:  Carb modified diet DISCHARGE MEDICATION: Allergies as of 05/12/2022   No Known Allergies      Medication List     TAKE these medications    AUVELITY PO Take 1 tablet by mouth as directed.   metFORMIN 500 MG tablet Commonly known as: GLUCOPHAGE Take 500 mg by mouth 2 (two) times daily with a meal.        Follow-up Information     Earna Coder, MD Follow up in 1 week(s).   Specialties: Internal Medicine, Oncology Contact information: [address] [place] [address] [phone]         Armando Gang, FNP Follow up in 10 day(s).   Specialty: Family Medicine Contact information: [address] [place] [address] [phone]                Discharge Exam: Ceasar Mons Weights   05/10/22 1727 05/12/22 1257  Weight: 100.7 kg 100.7 kg   General: She is in acute distress.     Appearance: She is obese.  Physician Discharge Summary   Patient: Danielle [name] MRN: [id number] DOB: 01-19-1976  Admit date:     05/10/2022  Discharge date: 05/12/22  Discharge Physician: Aslan Montagna   PCP: Armando Gang, FNP   Recommendations at discharge:    Follow up with hematology as an outpatient  Discharge Diagnoses: Principal Problem:   Symptomatic anemia Active Problems:   Tobacco abuse   Macrocytic anemia   Obesity (BMI 30-39.9)   Pulmonary HTN  Resolved Problems:   * No resolved hospital problems. *  Hospital Course: Danielle [name] is a 47 y.o. female reports no major medical history other than depression, anemia, and a tubal ligation about 13 years ago as well as been told by her physician that she started menopause about a year ago   She has been having some shortness of breath for about 2 weeks.  It progressed and now is experiencing associated left-sided chest pain starting just prior to arrival to the ER.  Is located up in the left upper chest sharp and somewhat hard to describe.  She reports that she does not have any history of bleeding or vaginal bleeding, but occasionally has seen small amounts of blood in her stool off-and-on.   She does not have a history of blood clots.   She has been feeling lightheaded and when she walks she is feel short of breath and like she might pass out at times for about 2 weeks but delayed seeking medical treatment until her daughter was able to go to the prom, which she reports the daughter went to this evening   Currently reports feeling very scared because of the shortness of breath she has been experiencing.  Of note she also appears quite anxious,     Assessment and Plan:  Symptomatic macrocytic Anemia: - Hb was 6.6 g/dL. Patient received 1 unit pRBC.  - GI was consulted who recommend Endoscopy/Colonoscopy  - Patient is status post upper and lower endoscopy which was negative - Patient will be discharged home to follow-up with  hematology as an outpatient. .   Morbid Obesity: BMI is 39.33 kg/m2. - Counseled on weight loss through diet and lifestyle modifications.    Suspected OSA: Echo shows evidence of pulmonary hypertension. - Recommend sleep study outpatient.   Diastolic dysfunction CHF Patient is euvolemic        Consultants: Gastroenterology Procedures performed: Upper and lower endoscopy  Disposition: Home Diet recommendation:  Carb modified diet DISCHARGE MEDICATION: Allergies as of 05/12/2022   No Known Allergies      Medication List     TAKE these medications    AUVELITY PO Take 1 tablet by mouth as directed.   metFORMIN 500 MG tablet Commonly known as: GLUCOPHAGE Take 500 mg by mouth 2 (two) times daily with a meal.        Follow-up Information     Earna Coder, MD Follow up in 1 week(s).   Specialties: Internal Medicine, Oncology Contact information: [address] [place] [address] [phone]         Armando Gang, FNP Follow up in 10 day(s).   Specialty: Family Medicine Contact information: [address] [place] [address] [phone]                Discharge Exam: Ceasar Mons Weights   05/10/22 1727 05/12/22 1257  Weight: 100.7 kg 100.7 kg   General: She is in acute distress.     Appearance: She is obese.  Physician Discharge Summary   Patient: Danielle [name] MRN: [id number] DOB: 01-19-1976  Admit date:     05/10/2022  Discharge date: 05/12/22  Discharge Physician: Aslan Montagna   PCP: Armando Gang, FNP   Recommendations at discharge:    Follow up with hematology as an outpatient  Discharge Diagnoses: Principal Problem:   Symptomatic anemia Active Problems:   Tobacco abuse   Macrocytic anemia   Obesity (BMI 30-39.9)   Pulmonary HTN  Resolved Problems:   * No resolved hospital problems. *  Hospital Course: Danielle [name] is a 47 y.o. female reports no major medical history other than depression, anemia, and a tubal ligation about 13 years ago as well as been told by her physician that she started menopause about a year ago   She has been having some shortness of breath for about 2 weeks.  It progressed and now is experiencing associated left-sided chest pain starting just prior to arrival to the ER.  Is located up in the left upper chest sharp and somewhat hard to describe.  She reports that she does not have any history of bleeding or vaginal bleeding, but occasionally has seen small amounts of blood in her stool off-and-on.   She does not have a history of blood clots.   She has been feeling lightheaded and when she walks she is feel short of breath and like she might pass out at times for about 2 weeks but delayed seeking medical treatment until her daughter was able to go to the prom, which she reports the daughter went to this evening   Currently reports feeling very scared because of the shortness of breath she has been experiencing.  Of note she also appears quite anxious,     Assessment and Plan:  Symptomatic macrocytic Anemia: - Hb was 6.6 g/dL. Patient received 1 unit pRBC.  - GI was consulted who recommend Endoscopy/Colonoscopy  - Patient is status post upper and lower endoscopy which was negative - Patient will be discharged home to follow-up with  hematology as an outpatient. .   Morbid Obesity: BMI is 39.33 kg/m2. - Counseled on weight loss through diet and lifestyle modifications.    Suspected OSA: Echo shows evidence of pulmonary hypertension. - Recommend sleep study outpatient.   Diastolic dysfunction CHF Patient is euvolemic        Consultants: Gastroenterology Procedures performed: Upper and lower endoscopy  Disposition: Home Diet recommendation:  Carb modified diet DISCHARGE MEDICATION: Allergies as of 05/12/2022   No Known Allergies      Medication List     TAKE these medications    AUVELITY PO Take 1 tablet by mouth as directed.   metFORMIN 500 MG tablet Commonly known as: GLUCOPHAGE Take 500 mg by mouth 2 (two) times daily with a meal.        Follow-up Information     Earna Coder, MD Follow up in 1 week(s).   Specialties: Internal Medicine, Oncology Contact information: [address] [place] [address] [phone]         Armando Gang, FNP Follow up in 10 day(s).   Specialty: Family Medicine Contact information: [address] [place] [address] [phone]                Discharge Exam: Ceasar Mons Weights   05/10/22 1727 05/12/22 1257  Weight: 100.7 kg 100.7 kg   General: She is in acute distress.     Appearance: She is obese.  Comments: Pale, weak, fatigued  HENT:     Head: Normocephalic and atraumatic.  Eyes:     Extraocular Movements: Extraocular movements intact.     Pupils: Pupils are equal, round, and reactive to light.  Neck:     Comments: No lymphadenopathy. Cardiovascular:     Rate and Rhythm: Normal rate and regular rhythm.     Pulses: Normal pulses.     Heart sounds: Normal heart sounds.  Pulmonary:     Breath sounds: Normal breath sounds.  Chest:     Chest wall: No tenderness.  Abdominal:     Palpations: Abdomen is soft.     Tenderness: There is no abdominal tenderness.     Comments: No hepatosplenomegaly   Skin:    General: Skin is warm and dry.     Capillary Refill: Capillary refill takes less than 2 seconds.     Comments: No jaundice.  Neurological:     General: No focal deficit present.  Psychiatric:        Mood and Affect: Mood normal.   Condition at discharge: stable  The results of significant diagnostics from this hospitalization (including imaging, microbiology, ancillary and laboratory) are listed below for reference.   Imaging Studies: ECHOCARDIOGRAM COMPLETE  Result Date: 05/11/2022    ECHOCARDIOGRAM REPORT   Patient Name:   GABRIELLA WOODHEAD Date of Exam: 05/11/2022 Medical Rec #:  [id number]       Height:       63.0 in Accession #:    [id number]      Weight:       222.0 lb Date of Birth:  02/15/1975       BSA:          2.021 m Patient Age:    46 years        BP:           105/71 mmHg Patient Gender: F               HR:           103 bpm. Exam Location:  [facility] Procedure: 2D Echo and Intracardiac Opacification Agent Indications:     Abnormal ECG R94.31  History:         Patient has no prior history of Echocardiogram examinations.  Sonographer:     Overton Mam RDCS Referring Phys:  [id number] Eliezer Mccoy PATEL Diagnosing Phys: Chilton Si MD  Sonographer Comments: Technically difficult study due to poor echo windows and patient is obese. Image acquisition challenging due to patient body habitus and Image acquisition challenging due to respiratory motion. IMPRESSIONS  1. Left ventricular ejection fraction, by estimation, is 55 to 60%. The left ventricle has normal function. The left ventricle has no regional wall motion abnormalities. Left ventricular diastolic parameters are consistent with Grade I diastolic dysfunction (impaired relaxation).  2. Right ventricular systolic function is normal. The right ventricular size is normal.  3. The mitral valve is normal in structure. No evidence of mitral valve regurgitation. No evidence of mitral stenosis.  4. The aortic valve is tricuspid. Aortic valve  regurgitation is not visualized. No aortic stenosis is present.  5. The inferior vena cava is normal in size with greater than 50% respiratory variability, suggesting right atrial pressure of 3 mmHg. FINDINGS  Left Ventricle: Left ventricular ejection fraction, by estimation, is 55 to 60%. The left ventricle has normal function. The left ventricle has no regional wall motion abnormalities. Definity contrast agent was given IV to delineate the left  Comments: Pale, weak, fatigued  HENT:     Head: Normocephalic and atraumatic.  Eyes:     Extraocular Movements: Extraocular movements intact.     Pupils: Pupils are equal, round, and reactive to light.  Neck:     Comments: No lymphadenopathy. Cardiovascular:     Rate and Rhythm: Normal rate and regular rhythm.     Pulses: Normal pulses.     Heart sounds: Normal heart sounds.  Pulmonary:     Breath sounds: Normal breath sounds.  Chest:     Chest wall: No tenderness.  Abdominal:     Palpations: Abdomen is soft.     Tenderness: There is no abdominal tenderness.     Comments: No hepatosplenomegaly   Skin:    General: Skin is warm and dry.     Capillary Refill: Capillary refill takes less than 2 seconds.     Comments: No jaundice.  Neurological:     General: No focal deficit present.  Psychiatric:        Mood and Affect: Mood normal.   Condition at discharge: stable  The results of significant diagnostics from this hospitalization (including imaging, microbiology, ancillary and laboratory) are listed below for reference.   Imaging Studies: ECHOCARDIOGRAM COMPLETE  Result Date: 05/11/2022    ECHOCARDIOGRAM REPORT   Patient Name:   GABRIELLA WOODHEAD Date of Exam: 05/11/2022 Medical Rec #:  [id number]       Height:       63.0 in Accession #:    [id number]      Weight:       222.0 lb Date of Birth:  02/15/1975       BSA:          2.021 m Patient Age:    46 years        BP:           105/71 mmHg Patient Gender: F               HR:           103 bpm. Exam Location:  [facility] Procedure: 2D Echo and Intracardiac Opacification Agent Indications:     Abnormal ECG R94.31  History:         Patient has no prior history of Echocardiogram examinations.  Sonographer:     Overton Mam RDCS Referring Phys:  [id number] Eliezer Mccoy PATEL Diagnosing Phys: Chilton Si MD  Sonographer Comments: Technically difficult study due to poor echo windows and patient is obese. Image acquisition challenging due to patient body habitus and Image acquisition challenging due to respiratory motion. IMPRESSIONS  1. Left ventricular ejection fraction, by estimation, is 55 to 60%. The left ventricle has normal function. The left ventricle has no regional wall motion abnormalities. Left ventricular diastolic parameters are consistent with Grade I diastolic dysfunction (impaired relaxation).  2. Right ventricular systolic function is normal. The right ventricular size is normal.  3. The mitral valve is normal in structure. No evidence of mitral valve regurgitation. No evidence of mitral stenosis.  4. The aortic valve is tricuspid. Aortic valve  regurgitation is not visualized. No aortic stenosis is present.  5. The inferior vena cava is normal in size with greater than 50% respiratory variability, suggesting right atrial pressure of 3 mmHg. FINDINGS  Left Ventricle: Left ventricular ejection fraction, by estimation, is 55 to 60%. The left ventricle has normal function. The left ventricle has no regional wall motion abnormalities. Definity contrast agent was given IV to delineate the left

## 2022-05-13 ENCOUNTER — Telehealth: Payer: Self-pay | Admitting: Internal Medicine

## 2022-05-13 ENCOUNTER — Encounter: Payer: Self-pay | Admitting: Gastroenterology

## 2022-05-13 LAB — BPAM RBC
Blood Product Expiration Date: 202405092359
Unit Type and Rh: 7300

## 2022-05-13 LAB — TYPE AND SCREEN
ABO/RH(D): B POS
Antibody Screen: POSITIVE
Donor AG Type: NEGATIVE
Unit division: 0
Unit division: 0
Unit division: 0

## 2022-05-13 NOTE — Telephone Encounter (Signed)
Per chat- Friday- MD labs to follow after appointment  Appointment scheduled and patient notified

## 2022-05-14 LAB — CELIAC DISEASE PANEL
Endomysial Ab, IgA: NEGATIVE
IgA: 512 mg/dL — ABNORMAL HIGH (ref 87–352)
Tissue Transglutaminase Ab, IgA: <2 U/mL (ref 0–3)

## 2022-05-16 ENCOUNTER — Inpatient Hospital Stay: Payer: Medicaid Other | Attending: Internal Medicine | Admitting: Internal Medicine

## 2022-05-16 ENCOUNTER — Encounter: Payer: Self-pay | Admitting: *Deleted

## 2022-05-16 ENCOUNTER — Encounter: Payer: Self-pay | Admitting: Internal Medicine

## 2022-05-16 ENCOUNTER — Ambulatory Visit: Payer: Medicaid Other | Admitting: Internal Medicine

## 2022-05-16 ENCOUNTER — Other Ambulatory Visit: Payer: Medicaid Other

## 2022-05-16 ENCOUNTER — Inpatient Hospital Stay: Payer: Medicaid Other

## 2022-05-16 ENCOUNTER — Telehealth: Payer: Self-pay | Admitting: *Deleted

## 2022-05-16 VITALS — BP 111/62 | HR 93 | Temp 97.3°F | Resp 20 | Wt 222.2 lb

## 2022-05-16 DIAGNOSIS — R0602 Shortness of breath: Secondary | ICD-10-CM | POA: Insufficient documentation

## 2022-05-16 DIAGNOSIS — D509 Iron deficiency anemia, unspecified: Secondary | ICD-10-CM | POA: Diagnosis present

## 2022-05-16 DIAGNOSIS — R079 Chest pain, unspecified: Secondary | ICD-10-CM | POA: Insufficient documentation

## 2022-05-16 DIAGNOSIS — D649 Anemia, unspecified: Secondary | ICD-10-CM

## 2022-05-16 DIAGNOSIS — D709 Neutropenia, unspecified: Secondary | ICD-10-CM | POA: Insufficient documentation

## 2022-05-16 DIAGNOSIS — D61818 Other pancytopenia: Secondary | ICD-10-CM | POA: Insufficient documentation

## 2022-05-16 LAB — CBC WITH DIFFERENTIAL/PLATELET
Abs Immature Granulocytes: 0.08 10*3/uL — ABNORMAL HIGH (ref 0.00–0.07)
Basophils Absolute: 0 10*3/uL (ref 0.0–0.1)
Basophils Relative: 0 %
Eosinophils Absolute: 0.1 10*3/uL (ref 0.0–0.5)
Eosinophils Relative: 2 %
HCT: 23.1 % — ABNORMAL LOW (ref 36.0–46.0)
Hemoglobin: 8.2 g/dL — ABNORMAL LOW (ref 12.0–15.0)
Immature Granulocytes: 2 %
Lymphocytes Relative: 35 %
Lymphs Abs: 1.4 10*3/uL (ref 0.7–4.0)
MCH: 37.4 pg — ABNORMAL HIGH (ref 26.0–34.0)
MCHC: 35.5 g/dL (ref 30.0–36.0)
MCV: 105.5 fL — ABNORMAL HIGH (ref 80.0–100.0)
Monocytes Absolute: 0.1 10*3/uL (ref 0.1–1.0)
Monocytes Relative: 3 %
Neutro Abs: 2.4 10*3/uL (ref 1.7–7.7)
Neutrophils Relative %: 58 %
Platelets: 106 10*3/uL — ABNORMAL LOW (ref 150–400)
RBC: 2.19 MIL/uL — ABNORMAL LOW (ref 3.87–5.11)
Smear Review: NORMAL
WBC: 4.1 10*3/uL (ref 4.0–10.5)
nRBC: 0.7 % — ABNORMAL HIGH (ref 0.0–0.2)

## 2022-05-16 LAB — COMPREHENSIVE METABOLIC PANEL
ALT: 36 U/L (ref 0–44)
AST: 55 U/L — ABNORMAL HIGH (ref 15–41)
Albumin: 4.3 g/dL (ref 3.5–5.0)
Alkaline Phosphatase: 62 U/L (ref 38–126)
Anion gap: 8 (ref 5–15)
BUN: 12 mg/dL (ref 6–20)
CO2: 26 mmol/L (ref 22–32)
Calcium: 9.2 mg/dL (ref 8.9–10.3)
Chloride: 101 mmol/L (ref 98–111)
Creatinine, Ser: 0.6 mg/dL (ref 0.44–1.00)
GFR, Estimated: >60 mL/min (ref >60)
Glucose, Bld: 110 mg/dL — ABNORMAL HIGH (ref 70–99)
Potassium: 4.2 mmol/L (ref 3.5–5.1)
Sodium: 135 mmol/L (ref 135–145)
Total Bilirubin: 2.3 mg/dL — ABNORMAL HIGH (ref 0.3–1.2)
Total Protein: 7.3 g/dL (ref 6.5–8.1)

## 2022-05-16 LAB — SAMPLE TO BLOOD BANK

## 2022-05-16 LAB — RETICULOCYTES
Immature Retic Fract: 17.7 % — ABNORMAL HIGH (ref 2.3–15.9)
RBC.: 2.2 MIL/uL — ABNORMAL LOW (ref 3.87–5.11)
Retic Count, Absolute: 78.1 10*3/uL (ref 19.0–186.0)
Retic Ct Pct: 3.6 % — ABNORMAL HIGH (ref 0.4–3.1)

## 2022-05-16 LAB — LACTATE DEHYDROGENASE: LDH: 1929 U/L — ABNORMAL HIGH (ref 98–192)

## 2022-05-16 NOTE — Assessment & Plan Note (Addendum)
#   2020- Symptomatic  anemia-hemoglobin 9.1 MCV 66.  Iron studies-August 2021 saturation 3% [PCP].  Prior history of iron deficiency anemia/heavy menstrual cycles-s/p Venofer 3 or more years ago.  Currently not menstruating.   # However, currently noted to have worsening anemia/thrombocytopenia.  Iron study is not consistent with iron deficiency [April 2024]-macrocytic in nature.  Discussed possible liver/bone marrow etiology. Recommend further imaging with an abdominal ultrasound and also bone marrow biopsy for further evaluation. B12/folate- WNL.  Consider CT scan if inconclusive.  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  #Smoker- quit April 2024-  # DISPOSITION: # US abdomen ASAP # bone marrow biopsy ASAP # labs today- cbc/cmp; hold tube;retic count-  MM pane; K/l light chains; retic panel- possible 1 unit on Monday- 4/22 # follow up in 2-3 weeks- MD; labs- cbc/hold tube; possible D-2 1 unit PRBC-  Dr.B  Addendum: LDH is elevated at 1100; add haptoglobin; hemoglobin 8.2 hold blood transfusion.

## 2022-05-16 NOTE — Telephone Encounter (Signed)
Faxed Checklist for CT guided Bone marrow Biopsy to Specialty scheduling.

## 2022-05-16 NOTE — Progress Notes (Signed)
[facility] CONSULT NOTE  Patient Care Team: Armando Gang, FNP as PCP - General (Family Medicine)  CHIEF COMPLAINTS/PURPOSE OF CONSULTATION: ANEMIA  HEMATOLOGY HISTORY  # IRON DEFICIENCY ANEMIA CHRONIC [since 2019] AUG 2021- hb-9; MCV- 63; Iron sat- 3%; EGD > 7 years ago [[place]]; colonoscopy/capsule-NA; PO iron constipates.   #History of heavy menstrual cycles  HISTORY OF PRESENTING ILLNESS: Accompanied by friend.  Ambulating independently.  Danielle [name] 47 y.o.  female with history of iron deficiency anemia likely secondary to heavy menstrual cycles is here for follow-up.  Of note patient currently not menstruating.  Patient previously received IV iron infusion with improvement. However patient lost to follow-up.   Patient was recently in the hospital for worsening anemia.  Given worsening anemia evaluation with GI EGD colonoscopy inconclusive.  Iron studies not consistent with iron deficiency.   Also patient noted to have worsening leukopenia/thrombocytopenia platelet the 70s. Patient feels poorly.   Patient received blood transfusion in the hospital.   Review of Systems  Constitutional:  Positive for malaise/fatigue. Negative for chills, diaphoresis, fever and weight loss.  HENT:  Negative for nosebleeds and sore throat.   Eyes:  Negative for double vision.  Respiratory:  Negative for cough, hemoptysis, sputum production, shortness of breath and wheezing.   Cardiovascular:  Negative for chest pain, palpitations, orthopnea and leg swelling.  Gastrointestinal:  Positive for nausea and vomiting. Negative for abdominal pain, blood in stool, constipation, diarrhea, heartburn and melena.  Genitourinary:  Negative for dysuria, frequency and urgency.  Musculoskeletal:  Positive for myalgias. Negative for back pain and joint pain.  Skin: Negative.  Negative for itching and rash.  Neurological:  Positive for dizziness and tingling. Negative for focal weakness,  weakness and headaches.  Endo/Heme/Allergies:  Does not bruise/bleed easily.  Psychiatric/Behavioral:  Negative for depression. The patient has insomnia. The patient is not nervous/anxious.     MEDICAL HISTORY:  Past Medical History:  Diagnosis Date   Anemia    Depression     SURGICAL HISTORY: Past Surgical History:  Procedure Laterality Date   ABDOMINAL HYSTERECTOMY     COLONOSCOPY WITH PROPOFOL N/A 05/12/2022   Procedure: COLONOSCOPY WITH PROPOFOL;  Surgeon: Midge Minium, MD;  Location: [facility] ENDOSCOPY;  Service: Endoscopy;  Laterality: N/A;   ESOPHAGOGASTRODUODENOSCOPY (EGD) WITH PROPOFOL N/A 05/12/2022   Procedure: ESOPHAGOGASTRODUODENOSCOPY (EGD) WITH PROPOFOL;  Surgeon: Midge Minium, MD;  Location: [facility] ENDOSCOPY;  Service: Endoscopy;  Laterality: N/A;   TONSILLECTOMY      SOCIAL HISTORY: Social History   Socioeconomic History   Marital status: Single    Spouse name: Not on file   Number of children: Not on file   Years of education: Not on file   Highest education level: Not on file  Occupational History   Not on file  Tobacco Use   Smoking status: Every Day    Packs/day: 0.50    Years: 25.00    Additional pack years: 0.00    Total pack years: 12.50    Types: Cigarettes   Smokeless tobacco: Never  Substance and Sexual Activity   Alcohol use: Not Currently   Drug use: Never   Sexual activity: Not on file  Other Topics Concern   Not on file  Social History Narrative   Lives in [place] with 4 kids; stay at home [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    Social Determinants of Health   Financial Resource Strain: Not on file  Food Insecurity: No Food Insecurity (05/11/2022)  Hunger Vital Sign    Worried About Programme researcher, broadcasting/film/video in the Last Year: Never true    Ran Out of Food in the Last Year: Never true  Transportation Needs: Not on file  Physical Activity: Not on file  Stress: Not on file  Social Connections: Not on file  Intimate Partner  Violence: Not on file    FAMILY HISTORY: Family History  Problem Relation Age of Onset   Breast cancer Paternal Aunt     ALLERGIES:  has No Known Allergies.  MEDICATIONS:  Current Outpatient Medications  Medication Sig Dispense Refill   Dextromethorphan-Bupropion (AUVELITY PO) Take 1 tablet by mouth as directed.     metFORMIN (GLUCOPHAGE) 500 MG tablet Take 500 mg by mouth 2 (two) times daily with a meal.     No current facility-administered medications for this visit.      PHYSICAL EXAMINATION:   Vitals:   05/16/22 0954  BP: 111/62  Pulse: 93  Resp: 20  Temp: (!) 97.3 F (36.3 C)  SpO2: 100%   Filed Weights   05/16/22 0954  Weight: 222 lb 3.2 oz (100.8 kg)    Physical Exam HENT:     Head: Normocephalic and atraumatic.     Mouth/Throat:     Pharynx: No oropharyngeal exudate.  Eyes:     Pupils: Pupils are equal, round, and reactive to light.  Cardiovascular:     Rate and Rhythm: Normal rate and regular rhythm.  Pulmonary:     Effort: Pulmonary effort is normal. No respiratory distress.     Breath sounds: Normal breath sounds. No wheezing.  Abdominal:     General: Bowel sounds are normal. There is no distension.     Palpations: Abdomen is soft. There is no mass.     Tenderness: There is no abdominal tenderness. There is no guarding or rebound.  Musculoskeletal:        General: No tenderness. Normal range of motion.     Cervical back: Normal range of motion and neck supple.  Skin:    General: Skin is warm.  Neurological:     Mental Status: She is alert and oriented to person, place, and time.  Psychiatric:        Mood and Affect: Affect normal.     LABORATORY DATA:  I have reviewed the data as listed Lab Results  Component Value Date   WBC 4.1 05/16/2022   HGB 8.2 (L) 05/16/2022   HCT 23.1 (L) 05/16/2022   MCV 105.5 (H) 05/16/2022   PLT 106 (L) 05/16/2022   Recent Labs    05/10/22 2030 05/11/22 0901 05/12/22 0458 05/16/22 1059  NA  --   138 139 135  K  --  3.7 4.0 4.2  CL  --  107 105 101  CO2  --  25 26 26   GLUCOSE  --  131* 107* 110*  BUN  --  9 12 12   CREATININE  --  0.53 0.59 0.60  CALCIUM  --  8.7* 8.9 9.2  GFRNONAA  --  >60 >60 >60  PROT 6.6 6.3*  --  7.3  ALBUMIN 3.6 3.3*  --  4.3  AST 32 36  --  55*  ALT 25 25  --  36  ALKPHOS 51 53  --  62  BILITOT 1.5* 1.9*  --  2.3*  BILIDIR 0.2 0.3*  --   --   IBILI 1.3* 1.6*  --   --      ECHOCARDIOGRAM COMPLETE  Result Date: 05/11/2022    ECHOCARDIOGRAM REPORT   Patient Name:   MARIPOSA ARTHURS Date of Exam: 05/11/2022 Medical Rec #:  [id number]       Height:       63.0 in Accession #:    [id number]      Weight:       222.0 lb Date of Birth:  [birth year]       BSA:          2.021 m Patient Age:    46 years        BP:           105/71 mmHg Patient Gender: F               HR:           103 bpm. Exam Location:  [facility] Procedure: 2D Echo and Intracardiac Opacification Agent Indications:     Abnormal ECG R94.31  History:         Patient has no prior history of Echocardiogram examinations.  Sonographer:     Overton Mam RDCS Referring Phys:  [id number] Eliezer Mccoy PATEL Diagnosing Phys: Chilton Si MD  Sonographer Comments: Technically difficult study due to poor echo windows and patient is obese. Image acquisition challenging due to patient body habitus and Image acquisition challenging due to respiratory motion. IMPRESSIONS  1. Left ventricular ejection fraction, by estimation, is 55 to 60%. The left ventricle has normal function. The left ventricle has no regional wall motion abnormalities. Left ventricular diastolic parameters are consistent with Grade I diastolic dysfunction (impaired relaxation).  2. Right ventricular systolic function is normal. The right ventricular size is normal.  3. The mitral valve is normal in structure. No evidence of mitral valve regurgitation. No evidence of mitral stenosis.  4. The aortic valve is tricuspid. Aortic valve regurgitation is not visualized. No  aortic stenosis is present.  5. The inferior vena cava is normal in size with greater than 50% respiratory variability, suggesting right atrial pressure of 3 mmHg. FINDINGS  Left Ventricle: Left ventricular ejection fraction, by estimation, is 55 to 60%. The left ventricle has normal function. The left ventricle has no regional wall motion abnormalities. Definity contrast agent was given IV to delineate the left ventricular  endocardial borders. The left ventricular internal cavity size was normal in size. There is no left ventricular hypertrophy. Left ventricular diastolic parameters are consistent with Grade I diastolic dysfunction (impaired relaxation). Right Ventricle: The right ventricular size is normal. No increase in right ventricular wall thickness. Right ventricular systolic function is normal. Left Atrium: Left atrial size was normal in size. Right Atrium: Right atrial size was normal in size. Pericardium: There is no evidence of pericardial effusion. Mitral Valve: The mitral valve is normal in structure. No evidence of mitral valve regurgitation. No evidence of mitral valve stenosis. Tricuspid Valve: The tricuspid valve is normal in structure. Tricuspid valve regurgitation is trivial. No evidence of tricuspid stenosis. Aortic Valve: The aortic valve is tricuspid. Aortic valve regurgitation is not visualized. No aortic stenosis is present. Aortic valve peak gradient measures 10.1 mmHg. Pulmonic Valve: The pulmonic valve was normal in structure. Pulmonic valve regurgitation is not visualized. No evidence of pulmonic stenosis. Aorta: The aortic root is normal in size and structure. Venous: The inferior vena cava is normal in size with greater than 50% respiratory variability, suggesting right atrial pressure of 3 mmHg. IAS/Shunts: No atrial level shunt detected by color flow Doppler.  LEFT VENTRICLE PLAX 2D LVIDd:  4.00 cm   Diastology LVIDs:         2.80 cm   LV e' medial:    10.00 cm/s LV PW:          1.00 cm   LV E/e' medial:  9.3 LV IVS:        1.00 cm   LV e' lateral:   9.57 cm/s LVOT diam:     2.00 cm   LV E/e' lateral: 9.7 LV SV:         77 LV SV Index:   38 LVOT Area:     3.14 cm  RIGHT VENTRICLE RV S prime:     18.40 cm/s TAPSE (M-mode): 1.8 cm LEFT ATRIUM           Index        RIGHT ATRIUM           Index LA diam:      3.20 cm 1.58 cm/m   RA Area:     16.60 cm LA Vol (A2C): 21.3 ml 10.54 ml/m  RA Volume:   46.50 ml  23.00 ml/m LA Vol (A4C): 27.3 ml 13.51 ml/m  AORTIC VALVE                 PULMONIC VALVE AV Area (Vmax): 2.45 cm     PV Vmax:        1.48 m/s AV Vmax:        159.00 cm/s  PV Peak grad:   8.8 mmHg AV Peak Grad:   10.1 mmHg    RVOT Peak grad: 4 mmHg LVOT Vmax:      124.00 cm/s LVOT Vmean:     83.600 cm/s LVOT VTI:       0.245 m  AORTA Ao Root diam: 3.00 cm MITRAL VALVE                TRICUSPID VALVE MV Area (PHT): 5.31 cm     TR Peak grad:   17.5 mmHg MV Decel Time: 143 msec     TR Vmax:        209.00 cm/s MV E velocity: 93.30 cm/s MV A velocity: 109.00 cm/s  SHUNTS MV E/A ratio:  0.86         Systemic VTI:  0.24 m                             Systemic Diam: 2.00 cm Chilton Si MD Electronically signed by Chilton Si MD Signature Date/Time: 05/11/2022/12:03:10 PM    Final    MR BRAIN WO CONTRAST  Result Date: 05/10/2022 CLINICAL DATA:  Initial evaluation for dizziness. EXAM: MRI HEAD WITHOUT CONTRAST TECHNIQUE: Multiplanar, multiecho pulse sequences of the brain and surrounding structures were obtained without intravenous contrast. COMPARISON:  CT from 05/26/2008. FINDINGS: Brain: Cerebral volume within normal limits. No significant cerebral white matter disease or other focal parenchymal signal abnormality. No evidence for acute or subacute ischemia. Gray-white matter differentiation maintained. No areas of chronic cortical infarction. No acute or chronic intracranial blood products. No mass lesion, midline shift or mass effect. No hydrocephalus or extra-axial fluid  collection. Pituitary gland and suprasellar region within normal limits. Vascular: Major intracranial vascular flow voids are maintained. Skull and upper cervical spine: Craniocervical junction normal. Diffusely decreased T1 signal intensity seen throughout the visualized bone marrow, nonspecific, but most commonly related to anemia, smoking, or obesity. No scalp soft tissue abnormality. Sinuses/Orbits: Globes and orbital soft tissues  within normal limits. Mild scattered mucosal thickening noted about the ethmoidal air cells. Paranasal sinuses are otherwise largely clear. Trace bilateral mastoid effusions noted, bowel significance. Other: None. IMPRESSION: 1. Normal brain MRI. No acute intracranial abnormality identified. 2. Decreased T1 signal intensity throughout the visualized bone marrow, nonspecific, but most commonly seen with anemia, smoking, or obesity. Correlation with laboratory values and history suggested. Electronically Signed   By: Rise Mu M.D.   On: 05/10/2022 21:45   CT Angio Chest PE W and/or Wo Contrast  Result Date: 05/10/2022 CLINICAL DATA:  Pulmonary embolism (PE) suspected, high prob Shortness of breath. EXAM: CT ANGIOGRAPHY CHEST WITH CONTRAST TECHNIQUE: Multidetector CT imaging of the chest was performed using the standard protocol during bolus administration of intravenous contrast. Multiplanar CT image reconstructions and MIPs were obtained to evaluate the vascular anatomy. RADIATION DOSE REDUCTION: This exam was performed according to the departmental dose-optimization program which includes automated exposure control, adjustment of the mA and/or kV according to patient size and/or use of iterative reconstruction technique. CONTRAST:  75mL OMNIPAQUE IOHEXOL 350 MG/ML SOLN COMPARISON:  Radiograph earlier today. Chest CT 05/31/2007 FINDINGS: Cardiovascular: There are no filling defects within the pulmonary arteries to suggest pulmonary embolus. Main pulmonary artery is  dilated at 3.9 cm. The thoracic aorta is normal in caliber. No aortic dissection or acute aortic findings. Heart is normal in size. No pericardial effusion. Mediastinum/Nodes: No mediastinal or hilar adenopathy. No visualized thyroid nodule. Unremarkable esophagus. Lungs/Pleura: Mild bronchial thickening and heterogeneous pulmonary parenchyma. Linear subsegmental opacity in the right upper lobe and left lower lobe. Previous 4 mm left lower lobe nodule is not seen. There is no pleural effusion. Upper Abdomen: No acute upper abdominal findings. Musculoskeletal: There are no acute or suspicious osseous abnormalities. Review of the MIP images confirms the above findings. IMPRESSION: 1. No pulmonary embolus. 2. Mild bronchial thickening and heterogeneous pulmonary parenchyma, can be seen with small airways disease. Linear subsegmental opacity in the right upper lobe and left lower lobe may be atelectasis or scarring. 3. Dilatation of the main pulmonary artery suggesting pulmonary arterial hypertension. Electronically Signed   By: Narda Rutherford M.D.   On: 05/10/2022 19:14   DG Chest 2 View  Result Date: 05/10/2022 CLINICAL DATA:  Shortness of breath EXAM: CHEST - 2 VIEW COMPARISON:  Chest x-ray May 31, 2007 FINDINGS: The cardiomediastinal silhouette is unchanged in contour. No focal pulmonary opacity. No pleural effusion or pneumothorax. The visualized upper abdomen is unremarkable. No acute osseous abnormality. IMPRESSION: No active cardiopulmonary disease. Electronically Signed   By: Jacob Moores M.D.   On: 05/10/2022 17:52    Symptomatic anemia # 2020- Symptomatic  anemia-hemoglobin 9.1 MCV 66.  Iron studies-August 2021 saturation 3% [PCP].  Prior history of iron deficiency anemia/heavy menstrual cycles-s/p Venofer 3 or more years ago.  Currently not menstruating.   # However, currently noted to have worsening anemia/thrombocytopenia.  Iron study is not consistent with iron deficiency [April  2024]-macrocytic in nature.  Discussed possible liver/bone marrow etiology. Recommend further imaging with an abdominal ultrasound and also bone marrow biopsy for further evaluation. B12/folate- WNL.  Consider CT scan if inconclusive.  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  #Smoker- quit April 2024-  # DISPOSITION: # US abdomen ASAP # bone marrow biopsy ASAP # labs today- cbc/cmp; hold tube;retic count-  MM pane; K/l light chains; retic panel- possible 1 unit on Monday-  4/22 # follow up in 2-3 weeks- MD; labs- cbc/hold tube; possible D-2 1 unit PRBC-  Dr.B  Addendum: LDH is elevated at 1100; add haptoglobin; hemoglobin 8.2 hold blood transfusion.  All questions were answered. The patient knows to call the clinic with any problems, questions or concerns.    Earna Coder, MD 05/16/2022 4:23 PM

## 2022-05-16 NOTE — Progress Notes (Signed)
Pt has ongoing fatigue. Dyspnea with any exertion. Has dizziness and lightheaded. Occ bright red blood on tissue from hemorrhoids. Diminished appetite. Craves ice chips.

## 2022-05-19 ENCOUNTER — Encounter: Payer: Self-pay | Admitting: *Deleted

## 2022-05-19 ENCOUNTER — Ambulatory Visit
Admission: RE | Admit: 2022-05-19 | Discharge: 2022-05-19 | Disposition: A | Discharge status: Home / Self Care | Payer: Medicaid Other | Source: Ambulatory Visit | Attending: Internal Medicine | Admitting: Internal Medicine

## 2022-05-19 ENCOUNTER — Other Ambulatory Visit: Payer: Self-pay

## 2022-05-19 DIAGNOSIS — D649 Anemia, unspecified: Secondary | ICD-10-CM

## 2022-05-19 DIAGNOSIS — D509 Iron deficiency anemia, unspecified: Secondary | ICD-10-CM | POA: Diagnosis not present

## 2022-05-19 LAB — KAPPA/LAMBDA LIGHT CHAINS
Kappa free light chain: 20.8 mg/L — ABNORMAL HIGH (ref 3.3–19.4)
Kappa, lambda light chain ratio: 0.29 (ref 0.26–1.65)
Lambda free light chains: 71.6 mg/L — ABNORMAL HIGH (ref 5.7–26.3)

## 2022-05-20 ENCOUNTER — Telehealth: Payer: Self-pay | Admitting: *Deleted

## 2022-05-20 LAB — HAPTOGLOBIN: Haptoglobin: <10 mg/dL — ABNORMAL LOW (ref 42–296)

## 2022-05-20 NOTE — Telephone Encounter (Signed)
Pt is scheduled for Bone marrow Bx on Weds may 1st. Arrival time 7:30 am. Nothing to eat or drink after midnight. Will need a driver. Report to heart and Vascular center beside the Emergency room.

## 2022-05-22 LAB — MULTIPLE MYELOMA PANEL, SERUM
Albumin SerPl Elph-Mcnc: 3.7 g/dL (ref 2.9–4.4)
Albumin/Glob SerPl: 1.2 (ref 0.7–1.7)
Alpha 1: 0.3 g/dL (ref 0.0–0.4)
Alpha2 Glob SerPl Elph-Mcnc: 0.5 g/dL (ref 0.4–1.0)
B-Globulin SerPl Elph-Mcnc: 1.3 g/dL (ref 0.7–1.3)
Gamma Glob SerPl Elph-Mcnc: 1 g/dL (ref 0.4–1.8)
Globulin, Total: 3.1 g/dL (ref 2.2–3.9)
IgA: 569 mg/dL — ABNORMAL HIGH (ref 87–352)
IgG (Immunoglobin G), Serum: 914 mg/dL (ref 586–1602)
IgM (Immunoglobulin M), Srm: 72 mg/dL (ref 26–217)
M Protein SerPl Elph-Mcnc: 0.6 g/dL — ABNORMAL HIGH
Total Protein ELP: 6.8 g/dL (ref 6.0–8.5)

## 2022-05-23 ENCOUNTER — Other Ambulatory Visit: Payer: Self-pay | Admitting: Internal Medicine

## 2022-05-23 DIAGNOSIS — D649 Anemia, unspecified: Secondary | ICD-10-CM

## 2022-05-23 IMAGING — MG MM DIGITAL SCREENING BILAT W/ TOMO AND CAD
6 of 10 series · 6 of 30 positions shown · non-contrast
Comparison: Previous exam(s).

CLINICAL DATA: Screening.

EXAM:
DIGITAL SCREENING BILATERAL MAMMOGRAM WITH TOMOSYNTHESIS AND CAD
TECHNIQUE: Bilateral screening digital craniocaudal and mediolateral oblique
mammograms were obtained. Bilateral screening digital breast
tomosynthesis was performed. The images were evaluated with
computer-aided detection.

[L CC synth-2D]
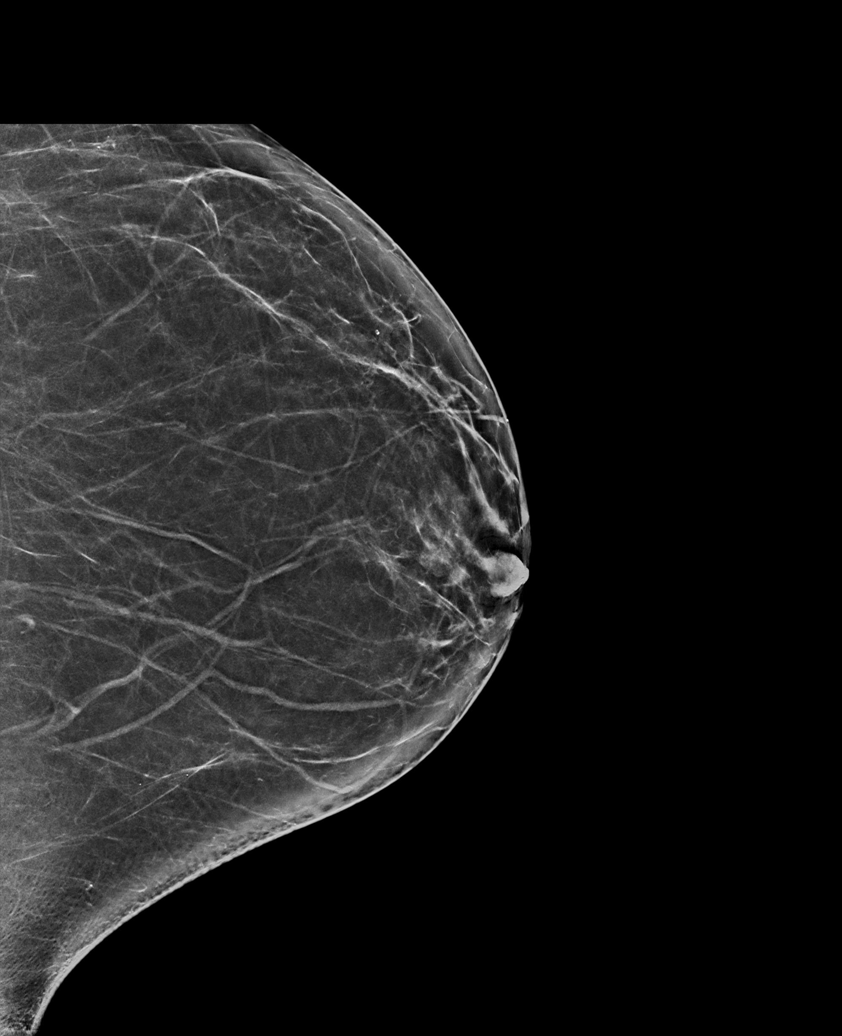

[R MLO synth-2D (1 of 2)]
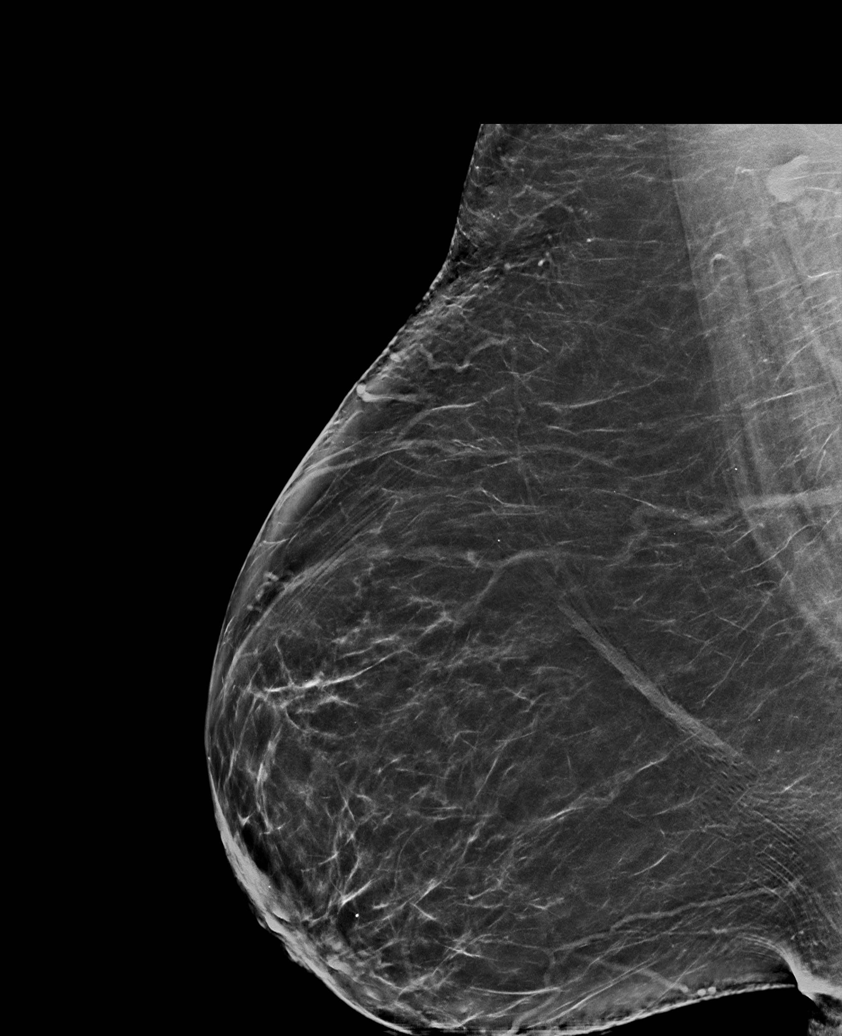

[L MLO synth-2D]
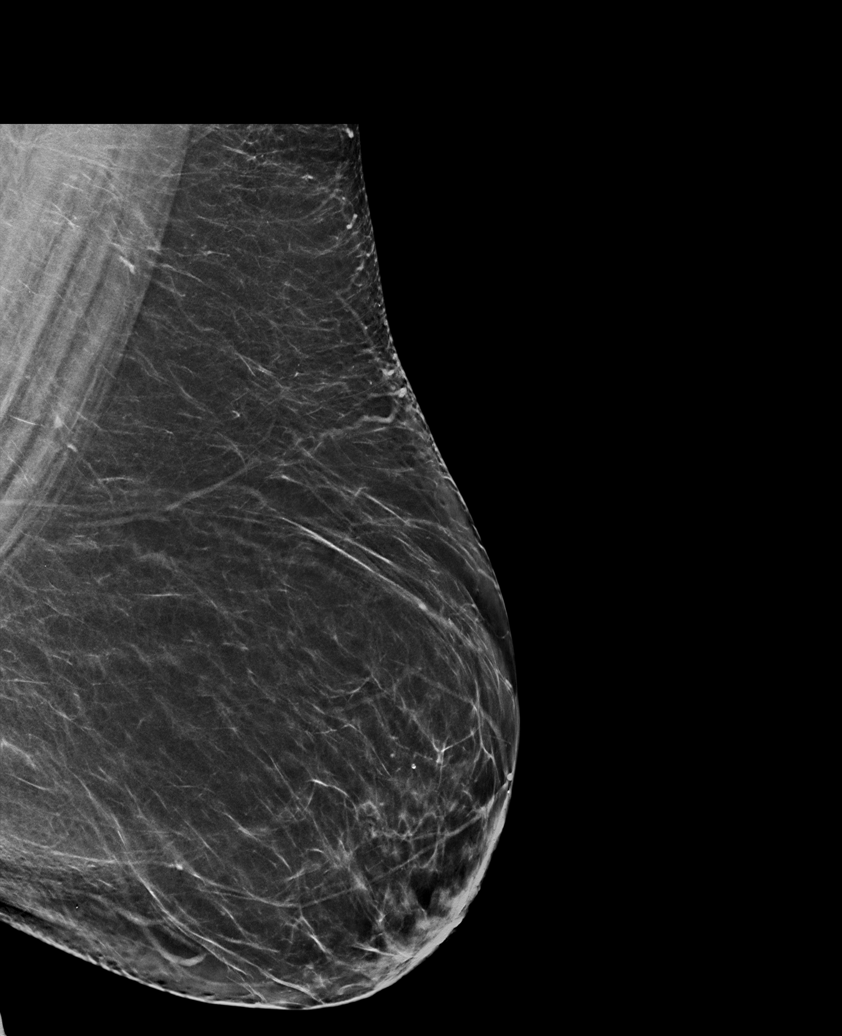

[R MLO synth-2D (2 of 2)]
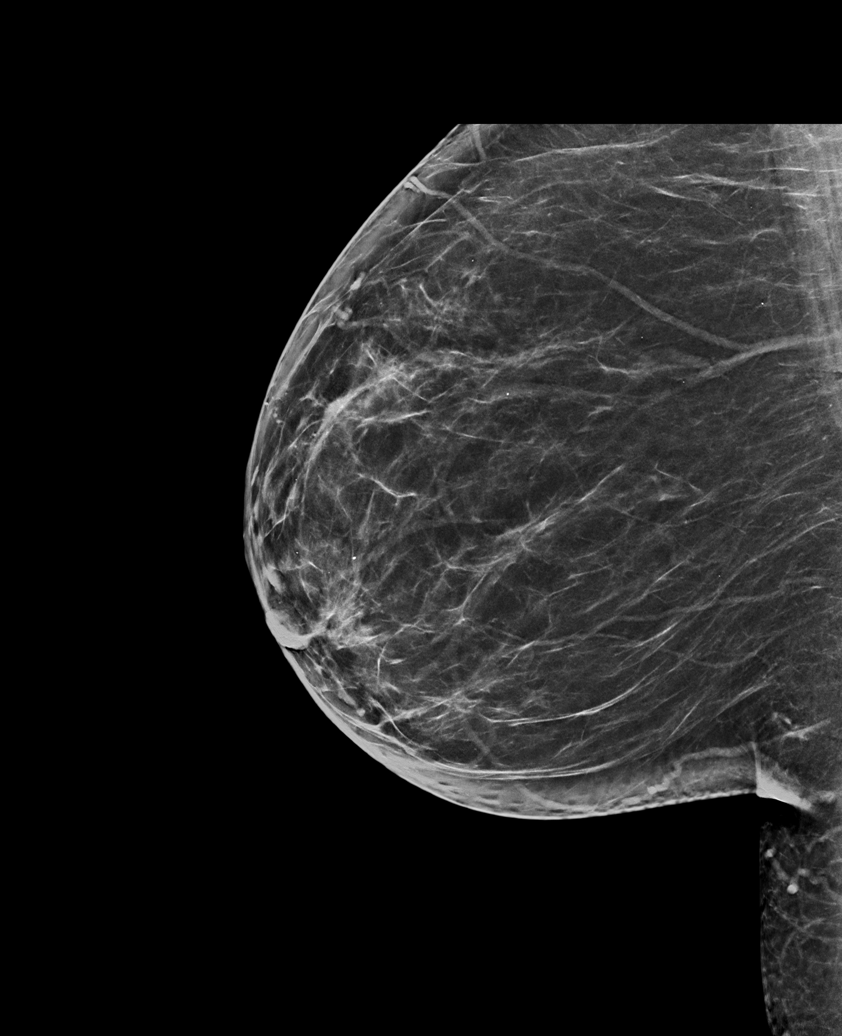

[R CC synth-2D]
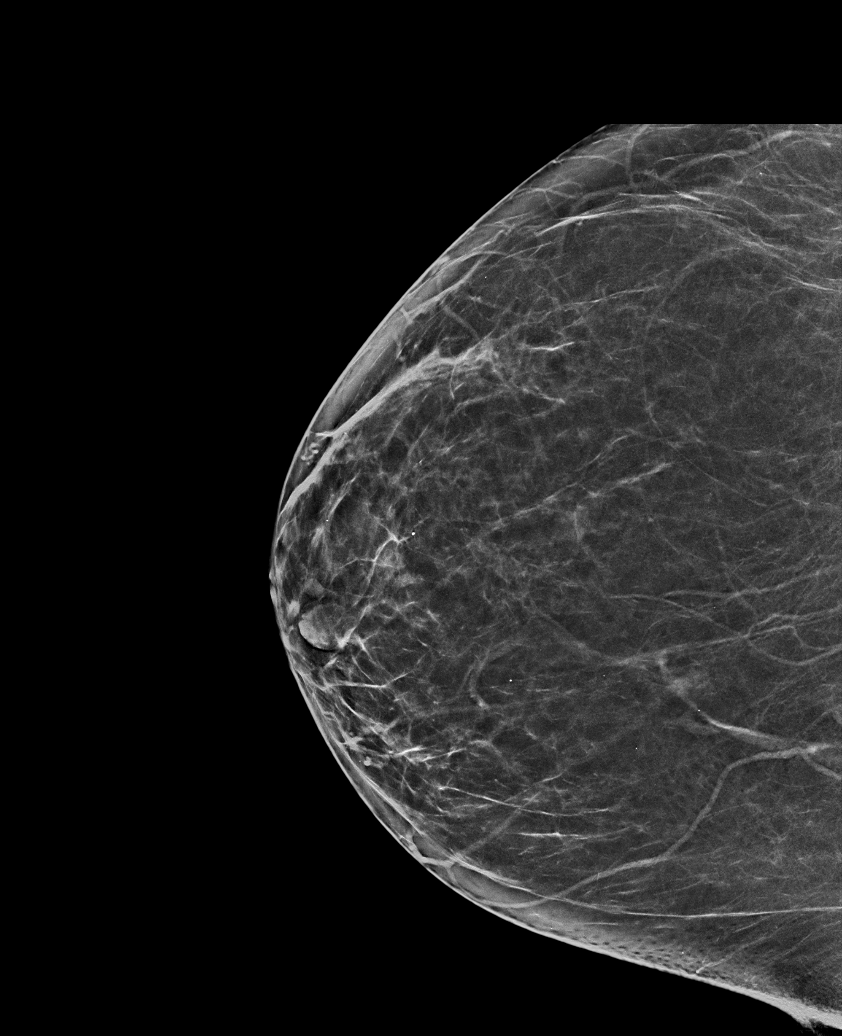

[L MLO tomo · tomo slice 39/78.0]
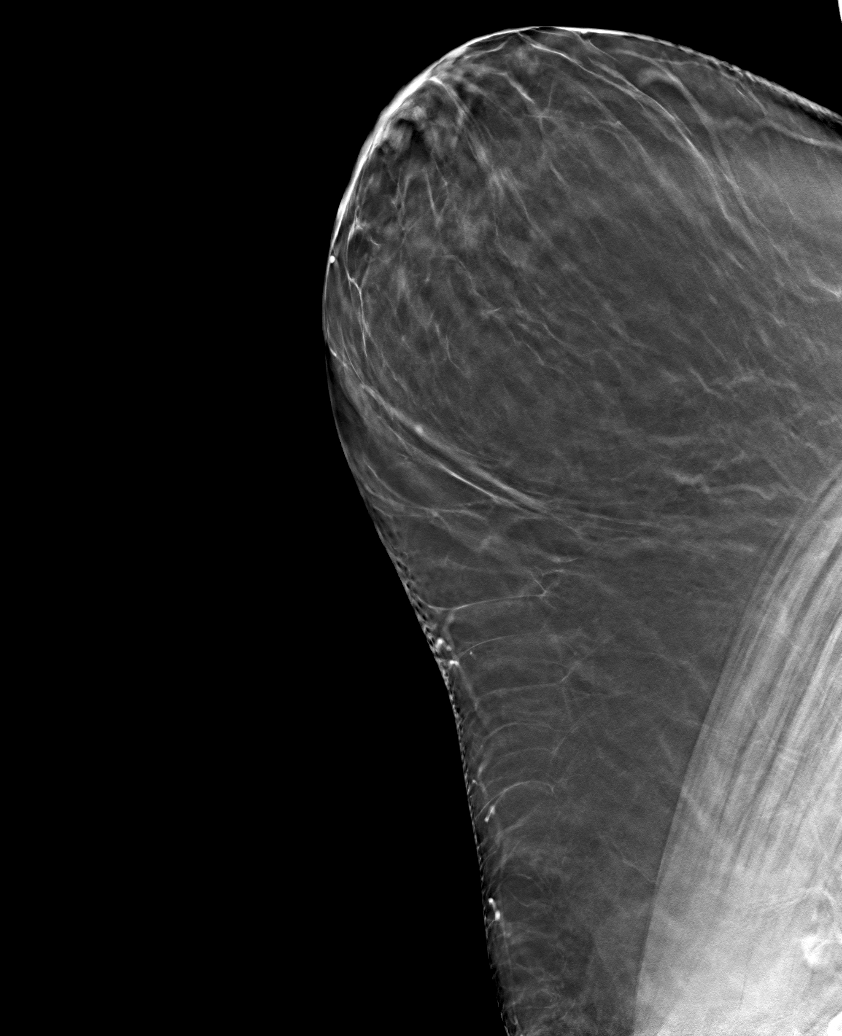

[6 of 30 positions shown; findings below may reference images not displayed]

ACR Breast Density Category b: There are scattered areas of
fibroglandular density.
FINDINGS: There are no findings suspicious for malignancy.
IMPRESSION: No mammographic evidence of malignancy. A result letter of this
screening mammogram will be mailed directly to the patient.

RECOMMENDATION:
Screening mammogram in one year. (Code:51-O-LD2)

BI-RADS CATEGORY  1: Negative.

## 2022-05-23 NOTE — Progress Notes (Signed)
Recommend blood work- for hemolysis ordered- today or 4/29.  GB

## 2022-05-24 NOTE — H&P (Signed)
Chief Complaint: Patient was seen in consultation today for pancytopenia at the request of Brahmanday,Govinda R  Referring Physician(s): Earna Coder  Supervising Physician: Pernell Dupre  Patient Status: [facility] - Out-pt  History of Present Illness: Danielle [name] is a 47 y.o. female referred by PCP to oncology to evaluate chronic IDA, leukopenia and thrombocytopenia.  Patient's anemia and thrombocytopenia have continued to worsen since 2020.  Recent labs show iron studies not consistent with IDA but possibly macrocytic in nature.  Due to concern for liver/bone marrow ideology, patient has been referred for bone marrow biopsy.  Past Medical History:  Diagnosis Date   Anemia    Depression     Past Surgical History:  Procedure Laterality Date   ABDOMINAL HYSTERECTOMY     COLONOSCOPY WITH PROPOFOL N/A 05/12/2022   Procedure: COLONOSCOPY WITH PROPOFOL;  Surgeon: Midge Minium, MD;  Location: [facility] [place] ENDOSCOPY;  Service: Endoscopy;  Laterality: N/A;   ESOPHAGOGASTRODUODENOSCOPY (EGD) WITH PROPOFOL N/A 05/12/2022   Procedure: ESOPHAGOGASTRODUODENOSCOPY (EGD) WITH PROPOFOL;  Surgeon: Midge Minium, MD;  Location: [facility] ENDOSCOPY;  Service: Endoscopy;  Laterality: N/A;   TONSILLECTOMY      Allergies: Patient has no known allergies.  Medications: Prior to Admission medications   Medication Sig Start Date End Date Taking? Authorizing Provider  Dextromethorphan-Bupropion (AUVELITY PO) Take 1 tablet by mouth as directed.    [provider]  metFORMIN (GLUCOPHAGE) 500 MG tablet Take 500 mg by mouth 2 (two) times daily with a meal.    [provider]     Family History  Problem Relation Age of Onset   Breast cancer Paternal Aunt     Social History   Socioeconomic History   Marital status: Single    Spouse name: Not on file   Number of children: Not on file   Years of education: Not on file   Highest education level: Not on file  Occupational History    Not on file  Tobacco Use   Smoking status: Every Day    Packs/day: 0.50    Years: 25.00    Additional pack years: 0.00    Total pack years: 12.50    Types: Cigarettes   Smokeless tobacco: Never  Substance and Sexual Activity   Alcohol use: Not Currently   Drug use: Never   Sexual activity: Not on file  Other Topics Concern   Not on file  Social History Narrative   Lives in [place] with 4 kids; stay at home [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    Social Determinants of Health   Financial Resource Strain: Not on file  Food Insecurity: No Food Insecurity (05/11/2022)   Hunger Vital Sign    Worried About Running Out of Food in the Last Year: Never true    Ran Out of Food in the Last Year: Never true  Transportation Needs: Not on file  Physical Activity: Not on file  Stress: Not on file  Social Connections: Not on file     Review of Systems: A 12 point ROS discussed and pertinent positives are indicated in the HPI above.  All other systems are negative.  Review of Systems  Vital Signs: LMP 11/21/2020     Physical Exam  Imaging: US Abdomen Complete  Result Date: 05/19/2022 CLINICAL DATA:  Cirrhosis.  Splenomegaly. EXAM: ABDOMEN ULTRASOUND COMPLETE COMPARISON:  None Available. FINDINGS: Gallbladder: No gallstones or wall thickening visualized. No sonographic Murphy sign noted by sonographer. Common bile duct: Diameter: 3.2 mm Liver: No focal  lesion identified. Within normal limits in parenchymal echogenicity. Portal vein is patent on color Doppler imaging with normal direction of blood flow towards the liver. IVC: No abnormality visualized. Pancreas: Visualized portion unremarkable. Spleen: The splenic length is 13.04 cm. The splenic volume is 402.2 cc. Right Kidney: Length: 10.7 cm. Echogenicity within normal limits. No mass or hydronephrosis visualized. Left Kidney: Length: 11.4 cm. Echogenicity within normal limits. No mass or hydronephrosis visualized.  Abdominal aorta: No aneurysm visualized. Other findings: None. IMPRESSION: 1. The spleen is borderline/prominent in size with a length of 13.04 cm and a volume of 402 cc. A diagnosis of splenomegaly with ultrasound requires a length of greater than 13 cm and a volume greater than 411 cc. 2. No other abnormalities. Electronically Signed   By: Gerome Sam III M.D.   On: 05/19/2022 16:55   ECHOCARDIOGRAM COMPLETE  Result Date: 05/11/2022    ECHOCARDIOGRAM REPORT   Patient Name:   Danielle [name] Date of Exam: 05/11/2022 Medical Rec #:  [id number]       Height:       63.0 in Accession #:    [id number]      Weight:       222.0 lb Date of Birth:  [birth year]       BSA:          2.021 m Patient Age:    46 years        BP:           105/71 mmHg Patient Gender: F               HR:           103 bpm. Exam Location:  [facility] Procedure: 2D Echo and Intracardiac Opacification Agent Indications:     Abnormal ECG R94.31  History:         Patient has no prior history of Echocardiogram examinations.  Sonographer:     Overton Mam RDCS Referring Phys:  [id number] Eliezer Mccoy PATEL Diagnosing Phys: Chilton Si MD  Sonographer Comments: Technically difficult study due to poor echo windows and patient is obese. Image acquisition challenging due to patient body habitus and Image acquisition challenging due to respiratory motion. IMPRESSIONS  1. Left ventricular ejection fraction, by estimation, is 55 to 60%. The left ventricle has normal function. The left ventricle has no regional wall motion abnormalities. Left ventricular diastolic parameters are consistent with Grade I diastolic dysfunction (impaired relaxation).  2. Right ventricular systolic function is normal. The right ventricular size is normal.  3. The mitral valve is normal in structure. No evidence of mitral valve regurgitation. No evidence of mitral stenosis.  4. The aortic valve is tricuspid. Aortic valve regurgitation is not visualized. No aortic stenosis is present.  5.  The inferior vena cava is normal in size with greater than 50% respiratory variability, suggesting right atrial pressure of 3 mmHg. FINDINGS  Left Ventricle: Left ventricular ejection fraction, by estimation, is 55 to 60%. The left ventricle has normal function. The left ventricle has no regional wall motion abnormalities. Definity contrast agent was given IV to delineate the left ventricular  endocardial borders. The left ventricular internal cavity size was normal in size. There is no left ventricular hypertrophy. Left ventricular diastolic parameters are consistent with Grade I diastolic dysfunction (impaired relaxation). Right Ventricle: The right ventricular size is normal. No increase in right ventricular wall thickness. Right ventricular systolic function is normal. Left Atrium: Left atrial size was normal in size. Right Atrium: Right  atrial size was normal in size. Pericardium: There is no evidence of pericardial effusion. Mitral Valve: The mitral valve is normal in structure. No evidence of mitral valve regurgitation. No evidence of mitral valve stenosis. Tricuspid Valve: The tricuspid valve is normal in structure. Tricuspid valve regurgitation is trivial. No evidence of tricuspid stenosis. Aortic Valve: The aortic valve is tricuspid. Aortic valve regurgitation is not visualized. No aortic stenosis is present. Aortic valve peak gradient measures 10.1 mmHg. Pulmonic Valve: The pulmonic valve was normal in structure. Pulmonic valve regurgitation is not visualized. No evidence of pulmonic stenosis. Aorta: The aortic root is normal in size and structure. Venous: The inferior vena cava is normal in size with greater than 50% respiratory variability, suggesting right atrial pressure of 3 mmHg. IAS/Shunts: No atrial level shunt detected by color flow Doppler.  LEFT VENTRICLE PLAX 2D LVIDd:         4.00 cm   Diastology LVIDs:         2.80 cm   LV e' medial:    10.00 cm/s LV PW:         1.00 cm   LV E/e' medial:   9.3 LV IVS:        1.00 cm   LV e' lateral:   9.57 cm/s LVOT diam:     2.00 cm   LV E/e' lateral: 9.7 LV SV:         77 LV SV Index:   38 LVOT Area:     3.14 cm  RIGHT VENTRICLE RV S prime:     18.40 cm/s TAPSE (M-mode): 1.8 cm LEFT ATRIUM           Index        RIGHT ATRIUM           Index LA diam:      3.20 cm 1.58 cm/m   RA Area:     16.60 cm LA Vol (A2C): 21.3 ml 10.54 ml/m  RA Volume:   46.50 ml  23.00 ml/m LA Vol (A4C): 27.3 ml 13.51 ml/m  AORTIC VALVE                 PULMONIC VALVE AV Area (Vmax): 2.45 cm     PV Vmax:        1.48 m/s AV Vmax:        159.00 cm/s  PV Peak grad:   8.8 mmHg AV Peak Grad:   10.1 mmHg    RVOT Peak grad: 4 mmHg LVOT Vmax:      124.00 cm/s LVOT Vmean:     83.600 cm/s LVOT VTI:       0.245 m  AORTA Ao Root diam: 3.00 cm MITRAL VALVE                TRICUSPID VALVE MV Area (PHT): 5.31 cm     TR Peak grad:   17.5 mmHg MV Decel Time: 143 msec     TR Vmax:        209.00 cm/s MV E velocity: 93.30 cm/s MV A velocity: 109.00 cm/s  SHUNTS MV E/A ratio:  0.86         Systemic VTI:  0.24 m                             Systemic Diam: 2.00 cm Chilton Si MD Electronically signed by Chilton Si MD Signature Date/Time: 05/11/2022/12:03:10 PM    Final  MR BRAIN WO CONTRAST  Result Date: 05/10/2022 CLINICAL DATA:  Initial evaluation for dizziness. EXAM: MRI HEAD WITHOUT CONTRAST TECHNIQUE: Multiplanar, multiecho pulse sequences of the brain and surrounding structures were obtained without intravenous contrast. COMPARISON:  CT from 05/26/2008. FINDINGS: Brain: Cerebral volume within normal limits. No significant cerebral white matter disease or other focal parenchymal signal abnormality. No evidence for acute or subacute ischemia. Gray-white matter differentiation maintained. No areas of chronic cortical infarction. No acute or chronic intracranial blood products. No mass lesion, midline shift or mass effect. No hydrocephalus or extra-axial fluid collection. Pituitary gland and  suprasellar region within normal limits. Vascular: Major intracranial vascular flow voids are maintained. Skull and upper cervical spine: Craniocervical junction normal. Diffusely decreased T1 signal intensity seen throughout the visualized bone marrow, nonspecific, but most commonly related to anemia, smoking, or obesity. No scalp soft tissue abnormality. Sinuses/Orbits: Globes and orbital soft tissues within normal limits. Mild scattered mucosal thickening noted about the ethmoidal air cells. Paranasal sinuses are otherwise largely clear. Trace bilateral mastoid effusions noted, bowel significance. Other: None. IMPRESSION: 1. Normal brain MRI. No acute intracranial abnormality identified. 2. Decreased T1 signal intensity throughout the visualized bone marrow, nonspecific, but most commonly seen with anemia, smoking, or obesity. Correlation with laboratory values and history suggested. Electronically Signed   By: Rise Mu M.D.   On: 05/10/2022 21:45   CT Angio Chest PE W and/or Wo Contrast  Result Date: 05/10/2022 CLINICAL DATA:  Pulmonary embolism (PE) suspected, high prob Shortness of breath. EXAM: CT ANGIOGRAPHY CHEST WITH CONTRAST TECHNIQUE: Multidetector CT imaging of the chest was performed using the standard protocol during bolus administration of intravenous contrast. Multiplanar CT image reconstructions and MIPs were obtained to evaluate the vascular anatomy. RADIATION DOSE REDUCTION: This exam was performed according to the departmental dose-optimization program which includes automated exposure control, adjustment of the mA and/or kV according to patient size and/or use of iterative reconstruction technique. CONTRAST:  75mL OMNIPAQUE IOHEXOL 350 MG/ML SOLN COMPARISON:  Radiograph earlier today. Chest CT 05/31/2007 FINDINGS: Cardiovascular: There are no filling defects within the pulmonary arteries to suggest pulmonary embolus. Main pulmonary artery is dilated at 3.9 cm. The thoracic aorta  is normal in caliber. No aortic dissection or acute aortic findings. Heart is normal in size. No pericardial effusion. Mediastinum/Nodes: No mediastinal or hilar adenopathy. No visualized thyroid nodule. Unremarkable esophagus. Lungs/Pleura: Mild bronchial thickening and heterogeneous pulmonary parenchyma. Linear subsegmental opacity in the right upper lobe and left lower lobe. Previous 4 mm left lower lobe nodule is not seen. There is no pleural effusion. Upper Abdomen: No acute upper abdominal findings. Musculoskeletal: There are no acute or suspicious osseous abnormalities. Review of the MIP images confirms the above findings. IMPRESSION: 1. No pulmonary embolus. 2. Mild bronchial thickening and heterogeneous pulmonary parenchyma, can be seen with small airways disease. Linear subsegmental opacity in the right upper lobe and left lower lobe may be atelectasis or scarring. 3. Dilatation of the main pulmonary artery suggesting pulmonary arterial hypertension. Electronically Signed   By: Narda Rutherford M.D.   On: 05/10/2022 19:14   DG Chest 2 View  Result Date: 05/10/2022 CLINICAL DATA:  Shortness of breath EXAM: CHEST - 2 VIEW COMPARISON:  Chest x-ray May 31, 2007 FINDINGS: The cardiomediastinal silhouette is unchanged in contour. No focal pulmonary opacity. No pleural effusion or pneumothorax. The visualized upper abdomen is unremarkable. No acute osseous abnormality. IMPRESSION: No active cardiopulmonary disease. Electronically Signed   By: Jacob Moores M.D.   On:  05/10/2022 17:52    Labs:  CBC: Recent Labs    05/11/22 0901 05/11/22 1540 05/12/22 0458 05/16/22 1059  WBC 3.2* 3.2* 3.2* 4.1  HGB 6.6* 6.6* 7.7* 8.2*  HCT 18.9* 19.0* 22.2* 23.1*  PLT 110* 85* 73* 106*    COAGS: Recent Labs    05/10/22 1730  INR 1.1    BMP: Recent Labs    05/10/22 1728 05/11/22 0901 05/12/22 0458 05/16/22 1059  NA 137 138 139 135  K 3.4* 3.7 4.0 4.2  CL 104 107 105 101  CO2 22 25 26 26    GLUCOSE 184* 131* 107* 110*  BUN 13 9 12 12   CALCIUM 9.1 8.7* 8.9 9.2  CREATININE 0.68 0.53 0.59 0.60  GFRNONAA >60 >60 >60 >60    LIVER FUNCTION TESTS: Recent Labs    05/10/22 2030 05/11/22 0901 05/16/22 1059  BILITOT 1.5* 1.9* 2.3*  AST 32 36 55*  ALT 25 25 36  ALKPHOS 51 53 62  PROT 6.6 6.3* 7.3  ALBUMIN 3.6 3.3* 4.3    TUMOR MARKERS: No results for input(s): "AFPTM", "CEA", "CA199", "CHROMGRNA" in the last 8760 hours.  Assessment and Plan:  47 year old female with PMHx for anemia, menorrhagia and depression presents for bone marrow biopsy with moderate sedation.  Risks and benefits of bone marrow biopsy and aspiration with moderate sedation was discussed with the patient and/or patient's family including, but not limited to bleeding, infection, damage to adjacent structures or low yield requiring additional tests.  All of the questions were answered and there is agreement to proceed.  Consent signed and in chart.  Thank you for this interesting consult.  I greatly enjoyed meeting Danielle [name] and look forward to participating in their care.  A copy of this report was sent to the requesting provider on this date.  Electronically Signed: Shon Hough, NP 05/24/2022, 12:31 PM   I spent a total of {New EAVW:[id number]} {New Out-Pt:[id number]}  {Established Out-Pt:[id number]} in face to face in clinical consultation, greater than 50% of which was counseling/coordinating care for pancytopenia.

## 2022-05-26 ENCOUNTER — Encounter: Payer: Self-pay | Admitting: Nurse Practitioner

## 2022-05-26 ENCOUNTER — Inpatient Hospital Stay: Payer: Medicaid Other

## 2022-05-26 ENCOUNTER — Other Ambulatory Visit: Payer: Self-pay | Admitting: *Deleted

## 2022-05-26 ENCOUNTER — Other Ambulatory Visit: Payer: Self-pay

## 2022-05-26 ENCOUNTER — Inpatient Hospital Stay (HOSPITAL_BASED_OUTPATIENT_CLINIC_OR_DEPARTMENT_OTHER): Payer: Medicaid Other | Admitting: Nurse Practitioner

## 2022-05-26 VITALS — BP 109/69 | HR 101 | Temp 97.8°F

## 2022-05-26 DIAGNOSIS — D61818 Other pancytopenia: Secondary | ICD-10-CM | POA: Diagnosis not present

## 2022-05-26 DIAGNOSIS — D649 Anemia, unspecified: Secondary | ICD-10-CM | POA: Diagnosis not present

## 2022-05-26 DIAGNOSIS — D539 Nutritional anemia, unspecified: Secondary | ICD-10-CM

## 2022-05-26 DIAGNOSIS — D509 Iron deficiency anemia, unspecified: Secondary | ICD-10-CM | POA: Diagnosis not present

## 2022-05-26 LAB — CBC WITH DIFFERENTIAL (CANCER CENTER ONLY)
Abs Immature Granulocytes: 0.04 10*3/uL (ref 0.00–0.07)
Basophils Absolute: 0 10*3/uL (ref 0.0–0.1)
Basophils Relative: 0 %
Eosinophils Absolute: 0 10*3/uL (ref 0.0–0.5)
Eosinophils Relative: 1 %
HCT: 20.6 % — ABNORMAL LOW (ref 36.0–46.0)
Hemoglobin: 7.1 g/dL — ABNORMAL LOW (ref 12.0–15.0)
Immature Granulocytes: 2 %
Lymphocytes Relative: 37 %
Lymphs Abs: 1 10*3/uL (ref 0.7–4.0)
MCH: 36.4 pg — ABNORMAL HIGH (ref 26.0–34.0)
MCHC: 34.5 g/dL (ref 30.0–36.0)
MCV: 105.6 fL — ABNORMAL HIGH (ref 80.0–100.0)
Monocytes Absolute: 0.1 10*3/uL (ref 0.1–1.0)
Monocytes Relative: 5 %
Neutro Abs: 1.5 10*3/uL — ABNORMAL LOW (ref 1.7–7.7)
Neutrophils Relative %: 55 %
Platelet Count: 86 10*3/uL — ABNORMAL LOW (ref 150–400)
RBC: 1.95 MIL/uL — ABNORMAL LOW (ref 3.87–5.11)
Smear Review: NORMAL
WBC Count: 2.7 10*3/uL — ABNORMAL LOW (ref 4.0–10.5)
nRBC: 3.8 % — ABNORMAL HIGH (ref 0.0–0.2)

## 2022-05-26 LAB — TYPE AND SCREEN
ABO/RH(D): B POS
Antibody Screen: POSITIVE

## 2022-05-26 LAB — BPAM RBC: Blood Product Expiration Date: 202405222359

## 2022-05-26 LAB — LACTATE DEHYDROGENASE: LDH: 2415 U/L — ABNORMAL HIGH (ref 98–192)

## 2022-05-26 LAB — DAT, POLYSPECIFIC AHG (ARMC ONLY): Polyspecific AHG test: NEGATIVE

## 2022-05-26 MED ORDER — PREDNISONE 50 MG PO TABS: 100.0000 mg | ORAL_TABLET | Freq: Every day | ORAL | 0 refills | Status: DC

## 2022-05-26 NOTE — Progress Notes (Signed)
41 U/L 55   ALT 0 - 44 U/L 36       Latest Ref Rng & Units 05/26/2022    9:45 AM  CBC  WBC 4.0 - 10.5 K/uL 2.7   Hemoglobin 12.0 - 15.0 g/dL 7.1   Hematocrit 16.1 - 46.0 % 20.6   Platelets 150 - 400 K/uL 86    LDH 98 - 192 U/L 05/26/22 at 0945  2,415 High  10 days ago  1,929 High  CM 2 years ago  115 CM    No images are attached to the encounter.  US Abdomen Complete  Result Date: 05/19/2022 CLINICAL DATA:  Cirrhosis.  Splenomegaly. EXAM: ABDOMEN ULTRASOUND COMPLETE COMPARISON:  None Available. FINDINGS: Gallbladder: No gallstones or wall thickening visualized. No sonographic Murphy sign noted by sonographer. Common bile duct: Diameter: 3.2 mm Liver: No focal lesion identified. Within normal limits in parenchymal echogenicity. Portal vein is patent on color Doppler imaging with normal direction of blood flow towards the liver. IVC: No abnormality visualized. Pancreas: Visualized portion unremarkable. Spleen: The splenic length is 13.04 cm. The splenic volume is 402.2 cc. Right Kidney: Length: 10.7 cm. Echogenicity within normal limits. No mass or hydronephrosis visualized. Left Kidney: Length: 11.4 cm. Echogenicity within normal limits. No mass or hydronephrosis visualized. Abdominal  aorta: No aneurysm visualized. Other findings: None. IMPRESSION: 1. The spleen is borderline/prominent in size with a length of 13.04 cm and a volume of 402 cc. A diagnosis of splenomegaly with ultrasound requires a length of greater than 13 cm and a volume greater than 411 cc. 2. No other abnormalities. Electronically Signed   By: Gerome Sam III M.D.   On: 05/19/2022 16:55   ECHOCARDIOGRAM COMPLETE  Result Date: 05/11/2022    ECHOCARDIOGRAM REPORT   Patient Name:   Danielle [name] Date of Exam: 05/11/2022 Medical Rec #:  [id number]       Height:       63.0 in Accession #:    [id number]      Weight:       222.0 lb Date of Birth:  [birth year]       BSA:          2.021 m Patient Age:    46 years        BP:           105/71 mmHg Patient Gender: F               HR:           103 bpm. Exam Location:  [facility] Procedure: 2D Echo and Intracardiac Opacification Agent Indications:     Abnormal ECG R94.31  History:         Patient has no prior history of Echocardiogram examinations.  Sonographer:     Overton Mam RDCS Referring Phys:  [id number] Eliezer Mccoy PATEL Diagnosing Phys: Chilton Si MD  Sonographer Comments: Technically difficult study due to poor echo windows and patient is obese. Image acquisition challenging due to patient body habitus and Image acquisition challenging due to respiratory motion. IMPRESSIONS  1. Left ventricular ejection fraction, by estimation, is 55 to 60%. The left ventricle has normal function. The left ventricle has no regional wall motion abnormalities. Left ventricular diastolic parameters are consistent with Grade I diastolic dysfunction (impaired relaxation).  2. Right ventricular systolic function is normal. The right ventricular size is normal.  3. The mitral valve is normal in structure. No evidence of mitral valve regurgitation. No evidence of mitral stenosis.  [facility]  [facility] A Department of the [facility] [address], [address] [place], [place] [address] [phone] (phone) [phone] (fax)  Patient Care Team: Armando Gang, FNP as PCP - General (Family Medicine)   Name of the patient: Danielle [name]  [id number]  02-03-1975   Date of visit: 05/26/22  Diagnosis- Symptomatic Anemia  Chief complaint/ Reason for visit- Chest pain & sob  Heme/Onc history:  # IRON DEFICIENCY ANEMIA CHRONIC [since 2019] AUG 2021- hb-9; MCV- 63; Iron sat- 3%; EGD > 7 years ago [[place]]; colonoscopy/capsule-NA; PO iron constipates. 2020- Symptomatic  anemia-hemoglobin 9.1 MCV 66.  Iron studies-August 2021 saturation 3% [PCP].  Prior history of iron deficiency anemia/heavy menstrual cycles-s/p Venofer 3 or more years ago.  Currently not menstruating.    #History of heavy menstrual cycles  # Worsening anemia/leukopenia/thrombocytopenia, plt 70s. Hmg worse. Blood in hospital. Iron studies not consistent with ID. GI EGD colonoscopy inconclusive. Discussed possible liver/bone marrow etiology. Abdominal ultrasound 05/19/22 borderline with length of 13.04 cm and volume of 402 cc. Not consistent with splenomegaly. Bone marrow biopsy planned for 05/28/22. B12/folate- WNL.  Consider CT scan if inconclusive.   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 LDH elevated. Concern for hemolysis.     Interval history- Patient is 47 year old female currently undergoing workup for worsening pancytopenia who presents to Symptom management clinic for complaints of acutely worsening shortness of breath, chest pain, and fatigue. Symptoms gradually worsening since she received blood transfusion. She has bone marrow biopsy later this week. No chest pain or sob at rest.   Review of  systems- Review of Systems  Constitutional:  Positive for malaise/fatigue. Negative for chills, fever and weight loss.  HENT:  Negative for nosebleeds.   Eyes:  Negative for blurred vision and double vision.  Respiratory:  Positive for shortness of breath. Negative for cough, hemoptysis and wheezing.   Cardiovascular:  Negative for chest pain, palpitations and leg swelling.  Gastrointestinal:  Negative for abdominal pain, blood in stool, constipation, diarrhea, melena, nausea and vomiting.  Genitourinary:  Negative for dysuria and urgency.  Musculoskeletal:  Negative for back pain, falls, joint pain and myalgias.  Skin:  Negative for itching and rash.  Neurological:  Negative for dizziness, tingling, sensory change, focal weakness, loss of consciousness and headaches.  Endo/Heme/Allergies:  Negative for environmental allergies. Does not bruise/bleed easily.  Psychiatric/Behavioral:  Negative for depression. The patient is nervous/anxious. The patient does not have insomnia.      No Known Allergies  Past Medical History:  Diagnosis Date   Anemia    Depression     Past Surgical History:  Procedure Laterality Date   ABDOMINAL HYSTERECTOMY     COLONOSCOPY WITH PROPOFOL N/A 05/12/2022   Procedure: COLONOSCOPY WITH PROPOFOL;  Surgeon: Midge Minium, MD;  Location: [facility] ENDOSCOPY;  Service: Endoscopy;  Laterality: N/A;   ESOPHAGOGASTRODUODENOSCOPY (EGD) WITH PROPOFOL N/A 05/12/2022   Procedure: ESOPHAGOGASTRODUODENOSCOPY (EGD) WITH PROPOFOL;  Surgeon: Midge Minium, MD;  Location: [facility] ENDOSCOPY;  Service: Endoscopy;  Laterality: N/A;   TONSILLECTOMY      Social History   Socioeconomic History   Marital status: Single    Spouse name: Not on file   Number of children: Not on file   Years of education: Not on file   Highest education level: Not on file  Occupational History  [facility]  [facility] A Department of the [facility] [address], [address] [place], [place] [address] [phone] (phone) [phone] (fax)  Patient Care Team: Armando Gang, FNP as PCP - General (Family Medicine)   Name of the patient: Danielle [name]  [id number]  02-03-1975   Date of visit: 05/26/22  Diagnosis- Symptomatic Anemia  Chief complaint/ Reason for visit- Chest pain & sob  Heme/Onc history:  # IRON DEFICIENCY ANEMIA CHRONIC [since 2019] AUG 2021- hb-9; MCV- 63; Iron sat- 3%; EGD > 7 years ago [[place]]; colonoscopy/capsule-NA; PO iron constipates. 2020- Symptomatic  anemia-hemoglobin 9.1 MCV 66.  Iron studies-August 2021 saturation 3% [PCP].  Prior history of iron deficiency anemia/heavy menstrual cycles-s/p Venofer 3 or more years ago.  Currently not menstruating.    #History of heavy menstrual cycles  # Worsening anemia/leukopenia/thrombocytopenia, plt 70s. Hmg worse. Blood in hospital. Iron studies not consistent with ID. GI EGD colonoscopy inconclusive. Discussed possible liver/bone marrow etiology. Abdominal ultrasound 05/19/22 borderline with length of 13.04 cm and volume of 402 cc. Not consistent with splenomegaly. Bone marrow biopsy planned for 05/28/22. B12/folate- WNL.  Consider CT scan if inconclusive.   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 LDH elevated. Concern for hemolysis.     Interval history- Patient is 47 year old female currently undergoing workup for worsening pancytopenia who presents to Symptom management clinic for complaints of acutely worsening shortness of breath, chest pain, and fatigue. Symptoms gradually worsening since she received blood transfusion. She has bone marrow biopsy later this week. No chest pain or sob at rest.   Review of  systems- Review of Systems  Constitutional:  Positive for malaise/fatigue. Negative for chills, fever and weight loss.  HENT:  Negative for nosebleeds.   Eyes:  Negative for blurred vision and double vision.  Respiratory:  Positive for shortness of breath. Negative for cough, hemoptysis and wheezing.   Cardiovascular:  Negative for chest pain, palpitations and leg swelling.  Gastrointestinal:  Negative for abdominal pain, blood in stool, constipation, diarrhea, melena, nausea and vomiting.  Genitourinary:  Negative for dysuria and urgency.  Musculoskeletal:  Negative for back pain, falls, joint pain and myalgias.  Skin:  Negative for itching and rash.  Neurological:  Negative for dizziness, tingling, sensory change, focal weakness, loss of consciousness and headaches.  Endo/Heme/Allergies:  Negative for environmental allergies. Does not bruise/bleed easily.  Psychiatric/Behavioral:  Negative for depression. The patient is nervous/anxious. The patient does not have insomnia.      No Known Allergies  Past Medical History:  Diagnosis Date   Anemia    Depression     Past Surgical History:  Procedure Laterality Date   ABDOMINAL HYSTERECTOMY     COLONOSCOPY WITH PROPOFOL N/A 05/12/2022   Procedure: COLONOSCOPY WITH PROPOFOL;  Surgeon: Midge Minium, MD;  Location: [facility] ENDOSCOPY;  Service: Endoscopy;  Laterality: N/A;   ESOPHAGOGASTRODUODENOSCOPY (EGD) WITH PROPOFOL N/A 05/12/2022   Procedure: ESOPHAGOGASTRODUODENOSCOPY (EGD) WITH PROPOFOL;  Surgeon: Midge Minium, MD;  Location: [facility] ENDOSCOPY;  Service: Endoscopy;  Laterality: N/A;   TONSILLECTOMY      Social History   Socioeconomic History   Marital status: Single    Spouse name: Not on file   Number of children: Not on file   Years of education: Not on file   Highest education level: Not on file  Occupational History  [facility]  [facility] A Department of the [facility] [address], [address] [place], [place] [address] [phone] (phone) [phone] (fax)  Patient Care Team: Armando Gang, FNP as PCP - General (Family Medicine)   Name of the patient: Danielle [name]  [id number]  02-03-1975   Date of visit: 05/26/22  Diagnosis- Symptomatic Anemia  Chief complaint/ Reason for visit- Chest pain & sob  Heme/Onc history:  # IRON DEFICIENCY ANEMIA CHRONIC [since 2019] AUG 2021- hb-9; MCV- 63; Iron sat- 3%; EGD > 7 years ago [[place]]; colonoscopy/capsule-NA; PO iron constipates. 2020- Symptomatic  anemia-hemoglobin 9.1 MCV 66.  Iron studies-August 2021 saturation 3% [PCP].  Prior history of iron deficiency anemia/heavy menstrual cycles-s/p Venofer 3 or more years ago.  Currently not menstruating.    #History of heavy menstrual cycles  # Worsening anemia/leukopenia/thrombocytopenia, plt 70s. Hmg worse. Blood in hospital. Iron studies not consistent with ID. GI EGD colonoscopy inconclusive. Discussed possible liver/bone marrow etiology. Abdominal ultrasound 05/19/22 borderline with length of 13.04 cm and volume of 402 cc. Not consistent with splenomegaly. Bone marrow biopsy planned for 05/28/22. B12/folate- WNL.  Consider CT scan if inconclusive.   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 LDH elevated. Concern for hemolysis.     Interval history- Patient is 47 year old female currently undergoing workup for worsening pancytopenia who presents to Symptom management clinic for complaints of acutely worsening shortness of breath, chest pain, and fatigue. Symptoms gradually worsening since she received blood transfusion. She has bone marrow biopsy later this week. No chest pain or sob at rest.   Review of  systems- Review of Systems  Constitutional:  Positive for malaise/fatigue. Negative for chills, fever and weight loss.  HENT:  Negative for nosebleeds.   Eyes:  Negative for blurred vision and double vision.  Respiratory:  Positive for shortness of breath. Negative for cough, hemoptysis and wheezing.   Cardiovascular:  Negative for chest pain, palpitations and leg swelling.  Gastrointestinal:  Negative for abdominal pain, blood in stool, constipation, diarrhea, melena, nausea and vomiting.  Genitourinary:  Negative for dysuria and urgency.  Musculoskeletal:  Negative for back pain, falls, joint pain and myalgias.  Skin:  Negative for itching and rash.  Neurological:  Negative for dizziness, tingling, sensory change, focal weakness, loss of consciousness and headaches.  Endo/Heme/Allergies:  Negative for environmental allergies. Does not bruise/bleed easily.  Psychiatric/Behavioral:  Negative for depression. The patient is nervous/anxious. The patient does not have insomnia.      No Known Allergies  Past Medical History:  Diagnosis Date   Anemia    Depression     Past Surgical History:  Procedure Laterality Date   ABDOMINAL HYSTERECTOMY     COLONOSCOPY WITH PROPOFOL N/A 05/12/2022   Procedure: COLONOSCOPY WITH PROPOFOL;  Surgeon: Midge Minium, MD;  Location: [facility] ENDOSCOPY;  Service: Endoscopy;  Laterality: N/A;   ESOPHAGOGASTRODUODENOSCOPY (EGD) WITH PROPOFOL N/A 05/12/2022   Procedure: ESOPHAGOGASTRODUODENOSCOPY (EGD) WITH PROPOFOL;  Surgeon: Midge Minium, MD;  Location: [facility] ENDOSCOPY;  Service: Endoscopy;  Laterality: N/A;   TONSILLECTOMY      Social History   Socioeconomic History   Marital status: Single    Spouse name: Not on file   Number of children: Not on file   Years of education: Not on file   Highest education level: Not on file  Occupational History  41 U/L 55   ALT 0 - 44 U/L 36       Latest Ref Rng & Units 05/26/2022    9:45 AM  CBC  WBC 4.0 - 10.5 K/uL 2.7   Hemoglobin 12.0 - 15.0 g/dL 7.1   Hematocrit 16.1 - 46.0 % 20.6   Platelets 150 - 400 K/uL 86    LDH 98 - 192 U/L 05/26/22 at 0945  2,415 High  10 days ago  1,929 High  CM 2 years ago  115 CM    No images are attached to the encounter.  US Abdomen Complete  Result Date: 05/19/2022 CLINICAL DATA:  Cirrhosis.  Splenomegaly. EXAM: ABDOMEN ULTRASOUND COMPLETE COMPARISON:  None Available. FINDINGS: Gallbladder: No gallstones or wall thickening visualized. No sonographic Murphy sign noted by sonographer. Common bile duct: Diameter: 3.2 mm Liver: No focal lesion identified. Within normal limits in parenchymal echogenicity. Portal vein is patent on color Doppler imaging with normal direction of blood flow towards the liver. IVC: No abnormality visualized. Pancreas: Visualized portion unremarkable. Spleen: The splenic length is 13.04 cm. The splenic volume is 402.2 cc. Right Kidney: Length: 10.7 cm. Echogenicity within normal limits. No mass or hydronephrosis visualized. Left Kidney: Length: 11.4 cm. Echogenicity within normal limits. No mass or hydronephrosis visualized. Abdominal  aorta: No aneurysm visualized. Other findings: None. IMPRESSION: 1. The spleen is borderline/prominent in size with a length of 13.04 cm and a volume of 402 cc. A diagnosis of splenomegaly with ultrasound requires a length of greater than 13 cm and a volume greater than 411 cc. 2. No other abnormalities. Electronically Signed   By: Gerome Sam III M.D.   On: 05/19/2022 16:55   ECHOCARDIOGRAM COMPLETE  Result Date: 05/11/2022    ECHOCARDIOGRAM REPORT   Patient Name:   Danielle [name] Date of Exam: 05/11/2022 Medical Rec #:  [id number]       Height:       63.0 in Accession #:    [id number]      Weight:       222.0 lb Date of Birth:  [birth year]       BSA:          2.021 m Patient Age:    46 years        BP:           105/71 mmHg Patient Gender: F               HR:           103 bpm. Exam Location:  [facility] Procedure: 2D Echo and Intracardiac Opacification Agent Indications:     Abnormal ECG R94.31  History:         Patient has no prior history of Echocardiogram examinations.  Sonographer:     Overton Mam RDCS Referring Phys:  [id number] Eliezer Mccoy PATEL Diagnosing Phys: Chilton Si MD  Sonographer Comments: Technically difficult study due to poor echo windows and patient is obese. Image acquisition challenging due to patient body habitus and Image acquisition challenging due to respiratory motion. IMPRESSIONS  1. Left ventricular ejection fraction, by estimation, is 55 to 60%. The left ventricle has normal function. The left ventricle has no regional wall motion abnormalities. Left ventricular diastolic parameters are consistent with Grade I diastolic dysfunction (impaired relaxation).  2. Right ventricular systolic function is normal. The right ventricular size is normal.  3. The mitral valve is normal in structure. No evidence of mitral valve regurgitation. No evidence of mitral stenosis.  41 U/L 55   ALT 0 - 44 U/L 36       Latest Ref Rng & Units 05/26/2022    9:45 AM  CBC  WBC 4.0 - 10.5 K/uL 2.7   Hemoglobin 12.0 - 15.0 g/dL 7.1   Hematocrit 16.1 - 46.0 % 20.6   Platelets 150 - 400 K/uL 86    LDH 98 - 192 U/L 05/26/22 at 0945  2,415 High  10 days ago  1,929 High  CM 2 years ago  115 CM    No images are attached to the encounter.  US Abdomen Complete  Result Date: 05/19/2022 CLINICAL DATA:  Cirrhosis.  Splenomegaly. EXAM: ABDOMEN ULTRASOUND COMPLETE COMPARISON:  None Available. FINDINGS: Gallbladder: No gallstones or wall thickening visualized. No sonographic Murphy sign noted by sonographer. Common bile duct: Diameter: 3.2 mm Liver: No focal lesion identified. Within normal limits in parenchymal echogenicity. Portal vein is patent on color Doppler imaging with normal direction of blood flow towards the liver. IVC: No abnormality visualized. Pancreas: Visualized portion unremarkable. Spleen: The splenic length is 13.04 cm. The splenic volume is 402.2 cc. Right Kidney: Length: 10.7 cm. Echogenicity within normal limits. No mass or hydronephrosis visualized. Left Kidney: Length: 11.4 cm. Echogenicity within normal limits. No mass or hydronephrosis visualized. Abdominal  aorta: No aneurysm visualized. Other findings: None. IMPRESSION: 1. The spleen is borderline/prominent in size with a length of 13.04 cm and a volume of 402 cc. A diagnosis of splenomegaly with ultrasound requires a length of greater than 13 cm and a volume greater than 411 cc. 2. No other abnormalities. Electronically Signed   By: Gerome Sam III M.D.   On: 05/19/2022 16:55   ECHOCARDIOGRAM COMPLETE  Result Date: 05/11/2022    ECHOCARDIOGRAM REPORT   Patient Name:   Danielle [name] Date of Exam: 05/11/2022 Medical Rec #:  [id number]       Height:       63.0 in Accession #:    [id number]      Weight:       222.0 lb Date of Birth:  [birth year]       BSA:          2.021 m Patient Age:    46 years        BP:           105/71 mmHg Patient Gender: F               HR:           103 bpm. Exam Location:  [facility] Procedure: 2D Echo and Intracardiac Opacification Agent Indications:     Abnormal ECG R94.31  History:         Patient has no prior history of Echocardiogram examinations.  Sonographer:     Overton Mam RDCS Referring Phys:  [id number] Eliezer Mccoy PATEL Diagnosing Phys: Chilton Si MD  Sonographer Comments: Technically difficult study due to poor echo windows and patient is obese. Image acquisition challenging due to patient body habitus and Image acquisition challenging due to respiratory motion. IMPRESSIONS  1. Left ventricular ejection fraction, by estimation, is 55 to 60%. The left ventricle has normal function. The left ventricle has no regional wall motion abnormalities. Left ventricular diastolic parameters are consistent with Grade I diastolic dysfunction (impaired relaxation).  2. Right ventricular systolic function is normal. The right ventricular size is normal.  3. The mitral valve is normal in structure. No evidence of mitral valve regurgitation. No evidence of mitral stenosis.

## 2022-05-27 ENCOUNTER — Inpatient Hospital Stay: Payer: Medicaid Other

## 2022-05-27 ENCOUNTER — Other Ambulatory Visit (HOSPITAL_COMMUNITY): Payer: Self-pay | Admitting: Student

## 2022-05-27 DIAGNOSIS — D649 Anemia, unspecified: Secondary | ICD-10-CM

## 2022-05-27 DIAGNOSIS — D509 Iron deficiency anemia, unspecified: Secondary | ICD-10-CM | POA: Diagnosis not present

## 2022-05-27 LAB — PREPARE RBC (CROSSMATCH)

## 2022-05-27 LAB — TYPE AND SCREEN

## 2022-05-27 LAB — BPAM RBC
Blood Product Expiration Date: 202405222359
Unit Type and Rh: 7300

## 2022-05-27 MED ORDER — SODIUM CHLORIDE 0.9% IV SOLUTION
250.0000 mL | Freq: Once | INTRAVENOUS | Status: DC
  Filled 2022-05-27: qty 250

## 2022-05-27 NOTE — Progress Notes (Signed)
Patient for IR Bone Marrow Biopsy on Wed 05/28/2022, I called and spoke with the patient on the phone and gave pre-procedure instructions. Pt was made aware to be here at 7:30a, NPO after MN prior to procedure as well as driver post procedure/recovery/discharge. Pt stated understanding.  Called 05/27/2022

## 2022-05-27 NOTE — Patient Instructions (Signed)
Blood Transfusion, Adult A blood transfusion is a procedure in which you receive blood through an IV tube. You may need this procedure because of: A bleeding disorder. An illness. An injury. A surgery. The blood may come from someone else (a donor). You may also be able to donate blood for yourself before a surgery. The blood given in a transfusion may be made up of different types of cells. You may get: Red blood cells. These carry oxygen to the cells in the body. Platelets. These help your blood to clot. Plasma. This is the liquid part of your blood. It carries proteins and other substances through the body. White blood cells. These help you fight infections. If you have a clotting disorder, you may also get other types of blood products. Depending on the type of blood product, this procedure may take 1-4 hours to complete. Tell your doctor about: Any bleeding problems you have. Any reactions you have had during a blood transfusion in the past. Any allergies you have. All medicines you are taking, including vitamins, herbs, eye drops, creams, and over-the-counter medicines. Any surgeries you have had. Any medical conditions you have. Whether you are pregnant or may be pregnant. What are the risks? Talk with your health care provider about risks. The most common problems include: A mild allergic reaction. This includes red, swollen areas of skin (hives) and itching. Fever or chills. This may be the body's response to new blood cells received. This may happen during or up to 4 hours after the transfusion. More serious problems may include: A serious allergic reaction. This includes breathing trouble or swelling around the face and lips. Too much fluid in the lungs. This may cause breathing problems. Lung injury. This causes breathing trouble and low oxygen in the blood. This can happen within hours of the transfusion or days later. Too much iron. This can happen after getting many blood  transfusions over a period of time. An infection or virus passed through the blood. This is rare. Donated blood is carefully tested before it is given. Your body's defense system (immune system) trying to attack the new blood cells. This is rare. Symptoms may include fever, chills, nausea, low blood pressure, and low back or chest pain. Donated cells attacking healthy tissues. This is rare. What happens before the procedure? You will have a blood test to find out your blood type. The test also finds out what type of blood your body will accept and matches it to the donor type. If you are going to have a planned surgery, you may be able to donate your own blood. This may be done in case you need a transfusion. You will have your temperature, blood pressure, and pulse checked. You may receive medicine to help prevent an allergic reaction. This may be done if you have had a reaction to a transfusion before. This medicine may be given to you by mouth or through an IV tube. What happens during the procedure?  An IV tube will be put into one of your veins. The bag of blood will be attached to your IV tube. Then, the blood will enter through your vein. Your temperature, blood pressure, and pulse will be checked often. This is done to find early signs of a transfusion reaction. Tell your nurse right away if you have any of these symptoms: Shortness of breath or trouble breathing. Chest or back pain. Fever or chills. Red, swollen areas of skin or itching. If you have any signs   or symptoms of a reaction, your transfusion will be stopped. You may also be given medicine. When the transfusion is finished, your IV tube will be taken out. Pressure may be put on the IV site for a few minutes. A bandage (dressing) will be put on the IV site. The procedure may vary among doctors and hospitals. What happens after the procedure? You will be monitored until you leave the hospital or clinic. This includes  checking your temperature, blood pressure, pulse, breathing rate, and blood oxygen level. Your blood may be tested to see how you have responded to the transfusion. You may be warmed with fluids or blankets. This is done to keep the temperature of your body normal. If you have your procedure in an outpatient setting, you will be told whom to contact to report any reactions. Where to find more information Visit the [employer]: redcross.org Summary A blood transfusion is a procedure in which you receive blood through an IV tube. The blood you are given may be made up of different blood cells. You may receive red blood cells, platelets, plasma, or white blood cells. Your temperature, blood pressure, and pulse will be checked often. After the procedure, your blood may be tested to see how you have responded. This information is not intended to replace advice given to you by your health care provider. Make sure you discuss any questions you have with your health care provider. Document Revised: 04/12/2021 Document Reviewed: 04/12/2021 Elsevier Patient Education  2023 [employer]  

## 2022-05-28 ENCOUNTER — Encounter: Payer: Self-pay | Admitting: Radiology

## 2022-05-28 ENCOUNTER — Other Ambulatory Visit: Payer: Self-pay

## 2022-05-28 ENCOUNTER — Ambulatory Visit
Admission: RE | Admit: 2022-05-28 | Discharge: 2022-05-28 | Disposition: A | Discharge status: Home / Self Care | Payer: Medicaid Other | Source: Ambulatory Visit | Attending: Internal Medicine | Admitting: Internal Medicine

## 2022-05-28 DIAGNOSIS — Z87891 Personal history of nicotine dependence: Secondary | ICD-10-CM | POA: Diagnosis not present

## 2022-05-28 DIAGNOSIS — D539 Nutritional anemia, unspecified: Secondary | ICD-10-CM | POA: Diagnosis not present

## 2022-05-28 DIAGNOSIS — D61818 Other pancytopenia: Secondary | ICD-10-CM | POA: Diagnosis not present

## 2022-05-28 DIAGNOSIS — D649 Anemia, unspecified: Secondary | ICD-10-CM

## 2022-05-28 DIAGNOSIS — D696 Thrombocytopenia, unspecified: Secondary | ICD-10-CM | POA: Insufficient documentation

## 2022-05-28 HISTORY — PX: IR BONE MARROW BIOPSY & ASPIRATION: IMG5727

## 2022-05-28 LAB — CBC WITH DIFFERENTIAL/PLATELET
Abs Immature Granulocytes: 0.07 10*3/uL (ref 0.00–0.07)
Basophils Absolute: 0 10*3/uL (ref 0.0–0.1)
Basophils Relative: 0 %
Eosinophils Absolute: 0 10*3/uL (ref 0.0–0.5)
Eosinophils Relative: 0 %
HCT: 27 % — ABNORMAL LOW (ref 36.0–46.0)
Hemoglobin: 9.3 g/dL — ABNORMAL LOW (ref 12.0–15.0)
Immature Granulocytes: 2 %
Lymphocytes Relative: 44 %
Lymphs Abs: 2.1 10*3/uL (ref 0.7–4.0)
MCH: 34.2 pg — ABNORMAL HIGH (ref 26.0–34.0)
MCHC: 34.4 g/dL (ref 30.0–36.0)
MCV: 99.3 fL (ref 80.0–100.0)
Monocytes Absolute: 0.2 10*3/uL (ref 0.1–1.0)
Monocytes Relative: 3 %
Neutro Abs: 2.4 10*3/uL (ref 1.7–7.7)
Neutrophils Relative %: 51 %
Platelets: 87 10*3/uL — ABNORMAL LOW (ref 150–400)
RBC: 2.72 MIL/uL — ABNORMAL LOW (ref 3.87–5.11)
RDW: 27.8 % — ABNORMAL HIGH (ref 11.5–15.5)
Smear Review: NORMAL
WBC: 4.7 10*3/uL (ref 4.0–10.5)
nRBC: 2.1 % — ABNORMAL HIGH (ref 0.0–0.2)

## 2022-05-28 LAB — TYPE AND SCREEN
Donor AG Type: NEGATIVE
Unit division: 0

## 2022-05-28 LAB — JAK2 GENOTYPR

## 2022-05-28 LAB — BPAM RBC
Blood Product Expiration Date: 202405222359
ISSUE DATE / TIME: 202404300901

## 2022-05-28 LAB — HEMOGLOBIN FREE, PLASMA: Hgb, Plasma: 7.2 mg/dL — ABNORMAL HIGH (ref 0.0–4.9)

## 2022-05-28 MED ORDER — MIDAZOLAM HCL 2 MG/2ML IJ SOLN
INTRAMUSCULAR | Status: AC | PRN
  Administered 2022-05-28: 1 mg via INTRAVENOUS
  Administered 2022-05-28: .5 mg via INTRAVENOUS

## 2022-05-28 MED ORDER — MIDAZOLAM HCL 2 MG/2ML IJ SOLN
INTRAMUSCULAR | Status: AC
  Filled 2022-05-28: qty 4

## 2022-05-28 MED ORDER — FENTANYL CITRATE (PF) 100 MCG/2ML IJ SOLN
INTRAMUSCULAR | Status: AC | PRN
  Administered 2022-05-28: 50 ug via INTRAVENOUS
  Administered 2022-05-28: 25 ug via INTRAVENOUS

## 2022-05-28 MED ORDER — ACETAMINOPHEN 500 MG PO TABS
ORAL_TABLET | ORAL | Status: AC
  Filled 2022-05-28: qty 2

## 2022-05-28 MED ORDER — NALOXONE HCL 2 MG/2ML IJ SOSY
PREFILLED_SYRINGE | INTRAMUSCULAR | Status: AC
  Filled 2022-05-28: qty 4

## 2022-05-28 MED ORDER — ACETAMINOPHEN 500 MG PO TABS
1000.0000 mg | ORAL_TABLET | Freq: Once | ORAL | Status: AC
  Administered 2022-05-28: 1000 mg via ORAL

## 2022-05-28 MED ORDER — LIDOCAINE HCL (PF) 1 % IJ SOLN
10.0000 mL | Freq: Once | INTRAMUSCULAR | Status: AC
  Administered 2022-05-28: 10 mL

## 2022-05-28 MED ORDER — FENTANYL CITRATE (PF) 100 MCG/2ML IJ SOLN
INTRAMUSCULAR | Status: AC
  Filled 2022-05-28: qty 4

## 2022-05-28 MED ORDER — FLUMAZENIL 0.5 MG/5ML IV SOLN
INTRAVENOUS | Status: AC
  Filled 2022-05-28: qty 5

## 2022-05-28 MED ORDER — SODIUM CHLORIDE 0.9 % IV SOLN
INTRAVENOUS | Status: DC
  Administered 2022-05-28: 1000 mL via INTRAVENOUS

## 2022-05-28 MED ORDER — HEPARIN SOD (PORK) LOCK FLUSH 100 UNIT/ML IV SOLN
INTRAVENOUS | Status: AC
  Filled 2022-05-28: qty 5

## 2022-05-28 NOTE — Discharge Instructions (Signed)
Bone Marrow Aspiration and Bone Marrow Biopsy, Adult, Care After This sheet gives you information about how to care for yourself after your procedure. If you have problems or questions, contact your health care provider.  What can I expect after the procedure?  After the procedure, it is common to have: Mild pain and tenderness. Swelling. Bruising.  Follow these instructions at home: Take over-the-counter or prescription medicines only as told by your health care provider. You may shower tomorrow Remove band aid tomorrow, replace with another bandaid if  site has any drainage from biopsy site. Wash your hands with soap and water before you touch your biopsy site  If soap and water are not available, use hand sanitizer. Change your dressing frequently for bleeding and/or drainage. Check your puncture site every day for signs of infection. Check for: More redness, swelling, or pain. More fluid or blood. Warmth. Pus or a bad smell. Return to your normal activities in 24hours.  Do not drive for 24 hours if you were given a medicine to help you relax (sedative). Keep all follow-up visits as told by your health care provider. This is important. Contact a health care provider if: You have more redness, swelling, or pain around the puncture site. You have more fluid or blood coming from the puncture site. Your puncture site feels warm to the touch. You have pus or a bad smell coming from the puncture site. You have a fever. Your pain is not controlled with medicine. This information is not intended to replace advice given to you by your health care provider. Make sure you discuss any questions you have with your health care provider. Document Released: 08/02/2004 Document Revised: 08/03/2015 Document Reviewed: 06/27/2015 Elsevier Interactive Patient Education  2018 [employer] 

## 2022-05-29 ENCOUNTER — Inpatient Hospital Stay (HOSPITAL_BASED_OUTPATIENT_CLINIC_OR_DEPARTMENT_OTHER): Payer: Medicaid Other | Admitting: Internal Medicine

## 2022-05-29 ENCOUNTER — Ambulatory Visit: Payer: Medicaid Other | Admitting: Radiology

## 2022-05-29 ENCOUNTER — Encounter: Payer: Self-pay | Admitting: Internal Medicine

## 2022-05-29 ENCOUNTER — Inpatient Hospital Stay: Payer: Medicaid Other | Attending: Nurse Practitioner

## 2022-05-29 VITALS — BP 128/75 | HR 95 | Temp 97.9°F | Ht 63.0 in | Wt 227.6 lb

## 2022-05-29 DIAGNOSIS — D649 Anemia, unspecified: Secondary | ICD-10-CM

## 2022-05-29 DIAGNOSIS — D509 Iron deficiency anemia, unspecified: Secondary | ICD-10-CM | POA: Diagnosis present

## 2022-05-29 DIAGNOSIS — Z7952 Long term (current) use of systemic steroids: Secondary | ICD-10-CM | POA: Diagnosis not present

## 2022-05-29 DIAGNOSIS — R161 Splenomegaly, not elsewhere classified: Secondary | ICD-10-CM | POA: Insufficient documentation

## 2022-05-29 DIAGNOSIS — Z87891 Personal history of nicotine dependence: Secondary | ICD-10-CM | POA: Diagnosis not present

## 2022-05-29 DIAGNOSIS — J069 Acute upper respiratory infection, unspecified: Secondary | ICD-10-CM | POA: Insufficient documentation

## 2022-05-29 LAB — CBC (CANCER CENTER ONLY)
HCT: 26.8 % — ABNORMAL LOW (ref 36.0–46.0)
Hemoglobin: 9.4 g/dL — ABNORMAL LOW (ref 12.0–15.0)
MCH: 34.6 pg — ABNORMAL HIGH (ref 26.0–34.0)
MCHC: 35.1 g/dL (ref 30.0–36.0)
MCV: 98.5 fL (ref 80.0–100.0)
Platelet Count: 101 10*3/uL — ABNORMAL LOW (ref 150–400)
RBC: 2.72 MIL/uL — ABNORMAL LOW (ref 3.87–5.11)
RDW: 27.5 % — ABNORMAL HIGH (ref 11.5–15.5)
WBC Count: 5.2 10*3/uL (ref 4.0–10.5)
nRBC: 1.4 % — ABNORMAL HIGH (ref 0.0–0.2)

## 2022-05-29 LAB — SAMPLE TO BLOOD BANK

## 2022-05-29 LAB — HEMOSIDERIN, URINE: Hemosiderin Qual, Ur: ABSENT

## 2022-05-29 MED ORDER — TRAMADOL HCL 50 MG PO TABS: 50.0000 mg | ORAL_TABLET | Freq: Three times a day (TID) | ORAL | 0 refills | Status: DC | PRN

## 2022-05-29 NOTE — Progress Notes (Signed)
Fatigue/weakness: yes Dyspena: yes Light headedness: yes Blood in stool: yes, bright red  C/o blurry vision. Concerned it may be a rx she is taking. Also, get hot and sweaty.  Dad past away last week.  Had a bone marrow bx yesterday at [facility].

## 2022-05-29 NOTE — Assessment & Plan Note (Addendum)
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  JAK2 testing negative.  Bone marrow biopsy pending- await Biopsy results. HOLD off metformin; and detromethomorphan- Bupropion.   # Today hemoglobin is 9.4-s/p PRBC transfusion 2 days ago.  Monitor closely.  Check complement levels.;  Hemoglobin cascade; G6PD levels.   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  Await [facility] testing.  # pain from bone marrow- recommend tramadol-as needed new prescription sent.  #Smoker- quit April 2024-  # Social issues patient's father recently passed away in [place].  She is planning a trip; and will be back next week.  Will reschedule appointments accordingly.  # DISPOSITION: # no blood tomorrow # re-schedule MD appt to MD; 5/8;  labs- cbc/cmp; [facility]; hold tube;5/09  D-2-1 unit possible [facility]- Dr.B  # 25 minutes face-to-face with the patient discussing the above plan of care; more than 50% of time spent on prognosis/ natural history; counseling and coordination.

## 2022-05-29 NOTE — Progress Notes (Signed)
[facility] CONSULT NOTE  Patient Care Team: Armando Gang, FNP as PCP - General (Family Medicine)  CHIEF COMPLAINTS/PURPOSE OF CONSULTATION: ANEMIA  HEMATOLOGY HISTORY  # IRON DEFICIENCY ANEMIA CHRONIC [since 2019] AUG 2021- hb-9; MCV- 63; Iron sat- 3%; EGD > 7 years ago [[place]]; colonoscopy/capsule-NA; PO iron constipates.   #History of heavy menstrual cycles  HISTORY OF PRESENTING ILLNESS: Accompanied by her mother.  Ambulating independently.  Danielle [name] 47 y.o.  female with severe hemolytic anemia of unclear etiology is here for follow-up.  Patient started on steroids 2 days ago given consideration of hemolysis.  Patient s/p bone marrow biopsy yesterday.  Complains of ongoing fatigue.  Also complains of shortness of breath on exertion and also lightheadedness with movement.  Complains of intermittent blurry vision.  Blood in stool: yes, bright red-had EGD/colonoscopy in April 2024.    Unfortunately patient's dad past away last week.   Review of Systems  Constitutional:  Positive for malaise/fatigue. Negative for chills, diaphoresis, fever and weight loss.  HENT:  Negative for nosebleeds and sore throat.   Eyes:  Negative for double vision.  Respiratory:  Negative for cough, hemoptysis, sputum production, shortness of breath and wheezing.   Cardiovascular:  Negative for chest pain, palpitations, orthopnea and leg swelling.  Gastrointestinal:  Positive for nausea and vomiting. Negative for abdominal pain, blood in stool, constipation, diarrhea, heartburn and melena.  Genitourinary:  Negative for dysuria, frequency and urgency.  Musculoskeletal:  Positive for myalgias. Negative for back pain and joint pain.  Skin: Negative.  Negative for itching and rash.  Neurological:  Positive for dizziness and tingling. Negative for focal weakness, weakness and headaches.  Endo/Heme/Allergies:  Does not bruise/bleed easily.  Psychiatric/Behavioral:  Negative for  depression. The patient has insomnia. The patient is not nervous/anxious.     MEDICAL HISTORY:  Past Medical History:  Diagnosis Date   Anemia    Depression     SURGICAL HISTORY: Past Surgical History:  Procedure Laterality Date   ABDOMINAL HYSTERECTOMY     COLONOSCOPY WITH PROPOFOL N/A 05/12/2022   Procedure: COLONOSCOPY WITH PROPOFOL;  Surgeon: Midge Minium, MD;  Location: [facility] ENDOSCOPY;  Service: Endoscopy;  Laterality: N/A;   ESOPHAGOGASTRODUODENOSCOPY (EGD) WITH PROPOFOL N/A 05/12/2022   Procedure: ESOPHAGOGASTRODUODENOSCOPY (EGD) WITH PROPOFOL;  Surgeon: Midge Minium, MD;  Location: [facility] ENDOSCOPY;  Service: Endoscopy;  Laterality: N/A;   IR BONE MARROW BIOPSY & ASPIRATION  05/28/2022   TONSILLECTOMY      SOCIAL HISTORY: Social History   Socioeconomic History   Marital status: Single    Spouse name: Not on file   Number of children: Not on file   Years of education: Not on file   Highest education level: Not on file  Occupational History   Not on file  Tobacco Use   Smoking status: Former    Packs/day: 0.50    Years: 25.00    Additional pack years: 0.00    Total pack years: 12.50    Types: Cigarettes    Quit date: 05/10/2022    Years since quitting: 0.0   Smokeless tobacco: Never  Vaping Use   Vaping Use: Never used  Substance and Sexual Activity   Alcohol use: Not Currently   Drug use: Never   Sexual activity: Not on file  Other Topics Concern   Not on file  Social History Narrative   Lives in [place] with 4 kids; stay at home [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    Social  Determinants of Health   Financial Resource Strain: Not on file  Food Insecurity: No Food Insecurity (05/11/2022)   Hunger Vital Sign    Worried About Running Out of Food in the Last Year: Never true    Ran Out of Food in the Last Year: Never true  Transportation Needs: Not on file  Physical Activity: Not on file  Stress: Not on file  Social Connections: Not on file   Intimate Partner Violence: Not on file    FAMILY HISTORY: Family History  Problem Relation Age of Onset   Breast cancer Paternal Aunt     ALLERGIES:  has No Known Allergies.  MEDICATIONS:  Current Outpatient Medications  Medication Sig Dispense Refill   Dextromethorphan-Bupropion (AUVELITY PO) Take 1 tablet by mouth as directed.     metFORMIN (GLUCOPHAGE) 500 MG tablet Take 500 mg by mouth 2 (two) times daily with a meal.     predniSONE (DELTASONE) 50 MG tablet Take 2 tablets (100 mg total) by mouth daily with breakfast. Do not discontinue medication without discussing with physician. 60 tablet 0   traMADol (ULTRAM) 50 MG tablet Take 1 tablet (50 mg total) by mouth every 8 (eight) hours as needed. 30 tablet 0   No current facility-administered medications for this visit.      PHYSICAL EXAMINATION:   Vitals:   05/29/22 0926  BP: 128/75  Pulse: 95  Temp: 97.9 F (36.6 C)  SpO2: 99%   Filed Weights   05/29/22 0926  Weight: 227 lb 9.6 oz (103.2 kg)    Physical Exam HENT:     Head: Normocephalic and atraumatic.     Mouth/Throat:     Pharynx: No oropharyngeal exudate.  Eyes:     Pupils: Pupils are equal, round, and reactive to light.  Cardiovascular:     Rate and Rhythm: Normal rate and regular rhythm.  Pulmonary:     Effort: Pulmonary effort is normal. No respiratory distress.     Breath sounds: Normal breath sounds. No wheezing.  Abdominal:     General: Bowel sounds are normal. There is no distension.     Palpations: Abdomen is soft. There is no mass.     Tenderness: There is no abdominal tenderness. There is no guarding or rebound.  Musculoskeletal:        General: No tenderness. Normal range of motion.     Cervical back: Normal range of motion and neck supple.  Skin:    General: Skin is warm.  Neurological:     Mental Status: She is alert and oriented to person, place, and time.  Psychiatric:        Mood and Affect: Affect normal.     LABORATORY  DATA:  I have reviewed the data as listed Lab Results  Component Value Date   WBC 5.2 05/29/2022   HGB 9.4 (L) 05/29/2022   HCT 26.8 (L) 05/29/2022   MCV 98.5 05/29/2022   PLT 101 (L) 05/29/2022   Recent Labs    05/10/22 2030 05/11/22 0901 05/12/22 0458 05/16/22 1059  NA  --  138 139 135  K  --  3.7 4.0 4.2  CL  --  107 105 101  CO2  --  25 26 26   GLUCOSE  --  131* 107* 110*  BUN  --  9 12 12   CREATININE  --  0.53 0.59 0.60  CALCIUM  --  8.7* 8.9 9.2  GFRNONAA  --  >60 >60 >60  PROT 6.6 6.3*  --  7.3  ALBUMIN 3.6 3.3*  --  4.3  AST 32 36  --  55*  ALT 25 25  --  36  ALKPHOS 51 53  --  62  BILITOT 1.5* 1.9*  --  2.3*  BILIDIR 0.2 0.3*  --   --   IBILI 1.3* 1.6*  --   --      IR BONE MARROW BIOPSY & ASPIRATION  Result Date: 05/28/2022 INDICATION: pancytopenia EXAM: Bone marrow aspiration and core biopsy using fluoroscopic guidance MEDICATIONS: None. ANESTHESIA/SEDATION: Moderate (conscious) sedation was employed during this procedure. A total of Versed 1.5 mg and Fentanyl 75 mcg was administered intravenously. Moderate Sedation Time: 12 minutes. The patient's level of consciousness and vital signs were monitored continuously by radiology nursing throughout the procedure under my direct supervision. FLUOROSCOPY TIME:  Fluoroscopy Time: 0.9 minutes (20 mGy) COMPLICATIONS: None immediate. PROCEDURE: Informed written consent was obtained from the patient after a thorough discussion of the procedural risks, benefits and alternatives. All questions were addressed. Maximal Sterile Barrier Technique was utilized including caps, mask, sterile gowns, sterile gloves, sterile drape, hand hygiene and skin antiseptic. A timeout was performed prior to the initiation of the procedure. The patient was placed prone on the IR exam table. Limited fluoroscopy of the pelvis was performed for planning purposes. Skin entry site was marked, and the overlying skin was prepped and draped in the standard  sterile fashion. Local analgesia was obtained with 1% lidocaine. Using fluoroscopic guidance, an 11 gauge needle was advanced just deep to the cortex of the right posterior ilium. Subsequently, bone marrow aspiration and core biopsy were performed. Specimens were submitted to lab/pathology for handling. Hemostasis was achieved with manual pressure, and a clean dressing was placed. The patient tolerated the procedure well without immediate complication. IMPRESSION: Successful bone marrow aspiration and core biopsy of the right posterior ilium using fluoroscopic guidance. Electronically Signed   By: Olive Bass M.D.   On: 05/28/2022 13:30   US Abdomen Complete  Result Date: 05/19/2022 CLINICAL DATA:  Cirrhosis.  Splenomegaly. EXAM: ABDOMEN ULTRASOUND COMPLETE COMPARISON:  None Available. FINDINGS: Gallbladder: No gallstones or wall thickening visualized. No sonographic Murphy sign noted by sonographer. Common bile duct: Diameter: 3.2 mm Liver: No focal lesion identified. Within normal limits in parenchymal echogenicity. Portal vein is patent on color Doppler imaging with normal direction of blood flow towards the liver. IVC: No abnormality visualized. Pancreas: Visualized portion unremarkable. Spleen: The splenic length is 13.04 cm. The splenic volume is 402.2 cc. Right Kidney: Length: 10.7 cm. Echogenicity within normal limits. No mass or hydronephrosis visualized. Left Kidney: Length: 11.4 cm. Echogenicity within normal limits. No mass or hydronephrosis visualized. Abdominal aorta: No aneurysm visualized. Other findings: None. IMPRESSION: 1. The spleen is borderline/prominent in size with a length of 13.04 cm and a volume of 402 cc. A diagnosis of splenomegaly with ultrasound requires a length of greater than 13 cm and a volume greater than 411 cc. 2. No other abnormalities. Electronically Signed   By: Gerome Sam III M.D.   On: 05/19/2022 16:55   ECHOCARDIOGRAM COMPLETE  Result Date: 05/11/2022     ECHOCARDIOGRAM REPORT   Patient Name:   ANJANA BRANDEWIE Date of Exam: 05/11/2022 Medical Rec #:  [id number]       Height:       63.0 in Accession #:    [id number]      Weight:       222.0 lb Date of Birth:  [birth year]  BSA:          2.021 m Patient Age:    46 years        BP:           105/71 mmHg Patient Gender: F               HR:           103 bpm. Exam Location:  [facility] Procedure: 2D Echo and Intracardiac Opacification Agent Indications:     Abnormal ECG R94.31  History:         Patient has no prior history of Echocardiogram examinations.  Sonographer:     Overton Mam RDCS Referring Phys:  [id number] Eliezer Mccoy PATEL Diagnosing Phys: Chilton Si MD  Sonographer Comments: Technically difficult study due to poor echo windows and patient is obese. Image acquisition challenging due to patient body habitus and Image acquisition challenging due to respiratory motion. IMPRESSIONS  1. Left ventricular ejection fraction, by estimation, is 55 to 60%. The left ventricle has normal function. The left ventricle has no regional wall motion abnormalities. Left ventricular diastolic parameters are consistent with Grade I diastolic dysfunction (impaired relaxation).  2. Right ventricular systolic function is normal. The right ventricular size is normal.  3. The mitral valve is normal in structure. No evidence of mitral valve regurgitation. No evidence of mitral stenosis.  4. The aortic valve is tricuspid. Aortic valve regurgitation is not visualized. No aortic stenosis is present.  5. The inferior vena cava is normal in size with greater than 50% respiratory variability, suggesting right atrial pressure of 3 mmHg. FINDINGS  Left Ventricle: Left ventricular ejection fraction, by estimation, is 55 to 60%. The left ventricle has normal function. The left ventricle has no regional wall motion abnormalities. Definity contrast agent was given IV to delineate the left ventricular  endocardial borders. The left ventricular internal  cavity size was normal in size. There is no left ventricular hypertrophy. Left ventricular diastolic parameters are consistent with Grade I diastolic dysfunction (impaired relaxation). Right Ventricle: The right ventricular size is normal. No increase in right ventricular wall thickness. Right ventricular systolic function is normal. Left Atrium: Left atrial size was normal in size. Right Atrium: Right atrial size was normal in size. Pericardium: There is no evidence of pericardial effusion. Mitral Valve: The mitral valve is normal in structure. No evidence of mitral valve regurgitation. No evidence of mitral valve stenosis. Tricuspid Valve: The tricuspid valve is normal in structure. Tricuspid valve regurgitation is trivial. No evidence of tricuspid stenosis. Aortic Valve: The aortic valve is tricuspid. Aortic valve regurgitation is not visualized. No aortic stenosis is present. Aortic valve peak gradient measures 10.1 mmHg. Pulmonic Valve: The pulmonic valve was normal in structure. Pulmonic valve regurgitation is not visualized. No evidence of pulmonic stenosis. Aorta: The aortic root is normal in size and structure. Venous: The inferior vena cava is normal in size with greater than 50% respiratory variability, suggesting right atrial pressure of 3 mmHg. IAS/Shunts: No atrial level shunt detected by color flow Doppler.  LEFT VENTRICLE PLAX 2D LVIDd:         4.00 cm   Diastology LVIDs:         2.80 cm   LV e' medial:    10.00 cm/s LV PW:         1.00 cm   LV E/e' medial:  9.3 LV IVS:        1.00 cm   LV e' lateral:   9.57 cm/s  LVOT diam:     2.00 cm   LV E/e' lateral: 9.7 LV SV:         77 LV SV Index:   38 LVOT Area:     3.14 cm  RIGHT VENTRICLE RV S prime:     18.40 cm/s TAPSE (M-mode): 1.8 cm LEFT ATRIUM           Index        RIGHT ATRIUM           Index LA diam:      3.20 cm 1.58 cm/m   RA Area:     16.60 cm LA Vol (A2C): 21.3 ml 10.54 ml/m  RA Volume:   46.50 ml  23.00 ml/m LA Vol (A4C): 27.3 ml 13.51  ml/m  AORTIC VALVE                 PULMONIC VALVE AV Area (Vmax): 2.45 cm     PV Vmax:        1.48 m/s AV Vmax:        159.00 cm/s  PV Peak grad:   8.8 mmHg AV Peak Grad:   10.1 mmHg    RVOT Peak grad: 4 mmHg LVOT Vmax:      124.00 cm/s LVOT Vmean:     83.600 cm/s LVOT VTI:       0.245 m  AORTA Ao Root diam: 3.00 cm MITRAL VALVE                TRICUSPID VALVE MV Area (PHT): 5.31 cm     TR Peak grad:   17.5 mmHg MV Decel Time: 143 msec     TR Vmax:        209.00 cm/s MV E velocity: 93.30 cm/s MV A velocity: 109.00 cm/s  SHUNTS MV E/A ratio:  0.86         Systemic VTI:  0.24 m                             Systemic Diam: 2.00 cm Chilton Si MD Electronically signed by Chilton Si MD Signature Date/Time: 05/11/2022/12:03:10 PM    Final    MR BRAIN WO CONTRAST  Result Date: 05/10/2022 CLINICAL DATA:  Initial evaluation for dizziness. EXAM: MRI HEAD WITHOUT CONTRAST TECHNIQUE: Multiplanar, multiecho pulse sequences of the brain and surrounding structures were obtained without intravenous contrast. COMPARISON:  CT from 05/26/2008. FINDINGS: Brain: Cerebral volume within normal limits. No significant cerebral white matter disease or other focal parenchymal signal abnormality. No evidence for acute or subacute ischemia. Gray-white matter differentiation maintained. No areas of chronic cortical infarction. No acute or chronic intracranial blood products. No mass lesion, midline shift or mass effect. No hydrocephalus or extra-axial fluid collection. Pituitary gland and suprasellar region within normal limits. Vascular: Major intracranial vascular flow voids are maintained. Skull and upper cervical spine: Craniocervical junction normal. Diffusely decreased T1 signal intensity seen throughout the visualized bone marrow, nonspecific, but most commonly related to anemia, smoking, or obesity. No scalp soft tissue abnormality. Sinuses/Orbits: Globes and orbital soft tissues within normal limits. Mild scattered mucosal  thickening noted about the ethmoidal air cells. Paranasal sinuses are otherwise largely clear. Trace bilateral mastoid effusions noted, bowel significance. Other: None. IMPRESSION: 1. Normal brain MRI. No acute intracranial abnormality identified. 2. Decreased T1 signal intensity throughout the visualized bone marrow, nonspecific, but most commonly seen with anemia, smoking, or obesity. Correlation with laboratory values and history suggested.  Electronically Signed   By: Rise Mu M.D.   On: 05/10/2022 21:45   CT Angio Chest PE W and/or Wo Contrast  Result Date: 05/10/2022 CLINICAL DATA:  Pulmonary embolism (PE) suspected, high prob Shortness of breath. EXAM: CT ANGIOGRAPHY CHEST WITH CONTRAST TECHNIQUE: Multidetector CT imaging of the chest was performed using the standard protocol during bolus administration of intravenous contrast. Multiplanar CT image reconstructions and MIPs were obtained to evaluate the vascular anatomy. RADIATION DOSE REDUCTION: This exam was performed according to the departmental dose-optimization program which includes automated exposure control, adjustment of the mA and/or kV according to patient size and/or use of iterative reconstruction technique. CONTRAST:  75mL OMNIPAQUE IOHEXOL 350 MG/ML SOLN COMPARISON:  Radiograph earlier today. Chest CT 05/31/2007 FINDINGS: Cardiovascular: There are no filling defects within the pulmonary arteries to suggest pulmonary embolus. Main pulmonary artery is dilated at 3.9 cm. The thoracic aorta is normal in caliber. No aortic dissection or acute aortic findings. Heart is normal in size. No pericardial effusion. Mediastinum/Nodes: No mediastinal or hilar adenopathy. No visualized thyroid nodule. Unremarkable esophagus. Lungs/Pleura: Mild bronchial thickening and heterogeneous pulmonary parenchyma. Linear subsegmental opacity in the right upper lobe and left lower lobe. Previous 4 mm left lower lobe nodule is not seen. There is no pleural  effusion. Upper Abdomen: No acute upper abdominal findings. Musculoskeletal: There are no acute or suspicious osseous abnormalities. Review of the MIP images confirms the above findings. IMPRESSION: 1. No pulmonary embolus. 2. Mild bronchial thickening and heterogeneous pulmonary parenchyma, can be seen with small airways disease. Linear subsegmental opacity in the right upper lobe and left lower lobe may be atelectasis or scarring. 3. Dilatation of the main pulmonary artery suggesting pulmonary arterial hypertension. Electronically Signed   By: Narda Rutherford M.D.   On: 05/10/2022 19:14   DG Chest 2 View  Result Date: 05/10/2022 CLINICAL DATA:  Shortness of breath EXAM: CHEST - 2 VIEW COMPARISON:  Chest x-ray May 31, 2007 FINDINGS: The cardiomediastinal silhouette is unchanged in contour. No focal pulmonary opacity. No pleural effusion or pneumothorax. The visualized upper abdomen is unremarkable. No acute osseous abnormality. IMPRESSION: No active cardiopulmonary disease. Electronically Signed   By: Jacob Moores M.D.   On: 05/10/2022 17:52    Symptomatic anemia # APRIL 2024- Severe hemolytic anemia-DAT negative.  Unclear etiology. 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  JAK2 testing negative.  Bone marrow biopsy pending- await Biopsy results. HOLD off metformin; and detromethomorphan- Bupropion.   # Today hemoglobin is 9.4-s/p PRBC transfusion 2 days ago.  Monitor closely.  Check complement levels.;  Hemoglobin cascade; G6PD levels.   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  Await PNH testing.  # pain from bone marrow- recommend tramadol-as needed new prescription sent.  #Smoker- quit April 2024-  # Social issues patient's father recently passed away in [place].  She is planning a trip; and will be  back next week.  Will reschedule appointments accordingly.  # DISPOSITION: # no blood tomorrow # re-schedule MD appt to MD; 5/8;  labs- cbc/cmp; LDH; hold tube;5/09  D-2-1 unit possible PRBC- Dr.B  # 25 minutes face-to-face with the patient discussing the above plan of care; more than 50% of time spent on prognosis/ natural history; counseling and coordination.   All questions were answered. The patient knows to call the clinic with any  problems, questions or concerns.    Earna Coder, MD 05/29/2022 4:32 PM

## 2022-06-03 ENCOUNTER — Inpatient Hospital Stay: Payer: Medicaid Other

## 2022-06-03 ENCOUNTER — Telehealth: Payer: Self-pay | Admitting: *Deleted

## 2022-06-03 ENCOUNTER — Inpatient Hospital Stay: Payer: Medicaid Other | Admitting: Internal Medicine

## 2022-06-03 ENCOUNTER — Telehealth: Payer: Self-pay | Admitting: Internal Medicine

## 2022-06-03 LAB — SURGICAL PATHOLOGY

## 2022-06-03 LAB — PNH PROFILE (-HIGH SENSITIVITY)

## 2022-06-03 NOTE — Telephone Encounter (Signed)
Patient called reportijg that she is having cold symptoms. Congestion and stuffiness I advised that she call her PCP for advice what to take over the counter for this. She agreed to call pcp but stated that Dr B had put her on steroids so that is why she called [place]. Please advise if you want to interject

## 2022-06-03 NOTE — Telephone Encounter (Signed)
Patient is out of town for her dads funeral and is sick she needs to cancel tomorrows appointment and Thursday blood. She will let us know when she is back to reschedule.

## 2022-06-04 ENCOUNTER — Inpatient Hospital Stay: Payer: Medicaid Other

## 2022-06-04 ENCOUNTER — Inpatient Hospital Stay: Payer: Medicaid Other | Admitting: Internal Medicine

## 2022-06-04 ENCOUNTER — Ambulatory Visit: Payer: Medicaid Other

## 2022-06-05 ENCOUNTER — Encounter: Payer: Self-pay | Admitting: Internal Medicine

## 2022-06-05 ENCOUNTER — Inpatient Hospital Stay: Payer: Medicaid Other

## 2022-06-05 ENCOUNTER — Encounter (HOSPITAL_COMMUNITY): Payer: Self-pay | Admitting: Internal Medicine

## 2022-06-08 ENCOUNTER — Emergency Department
Admission: EM | Admit: 2022-06-08 | Discharge: 2022-06-08 | Disposition: A | Discharge status: Home / Self Care | Payer: Medicaid Other | Attending: Emergency Medicine | Admitting: Emergency Medicine

## 2022-06-08 ENCOUNTER — Other Ambulatory Visit: Payer: Self-pay

## 2022-06-08 DIAGNOSIS — Z1152 Encounter for screening for COVID-19: Secondary | ICD-10-CM | POA: Diagnosis not present

## 2022-06-08 DIAGNOSIS — J069 Acute upper respiratory infection, unspecified: Secondary | ICD-10-CM | POA: Insufficient documentation

## 2022-06-08 DIAGNOSIS — R059 Cough, unspecified: Secondary | ICD-10-CM | POA: Diagnosis present

## 2022-06-08 LAB — SARS CORONAVIRUS 2 BY RT PCR: SARS Coronavirus 2 by RT PCR: NEGATIVE

## 2022-06-08 MED ORDER — GUAIFENESIN-CODEINE 100-10 MG/5ML PO SOLN: 5.0000 mL | Freq: Four times a day (QID) | ORAL | 0 refills | Status: DC | PRN

## 2022-06-08 MED ORDER — CEPHALEXIN 500 MG PO CAPS: 500.0000 mg | ORAL_CAPSULE | Freq: Two times a day (BID) | ORAL | 0 refills | Status: DC

## 2022-06-08 NOTE — ED Provider Notes (Signed)
[facility] Provider Note    Event Date/Time   First MD Initiated Contact with Patient 06/08/22 1054     (approximate)  History   Chief Complaint: Facial Swelling  HPI  Danielle [name] is a 47 y.o. female with a past medical history of anemia, depression, presents to the emergency department for cough sore throat and facial swelling.  According to the patient she is currently being treated by hematology for anemia, is requiring iron transfusions.  Patient states she recently traveled to [facility] for her father's funeral after getting back developed cough and congestion.  Patient states for the past 4 days she has been coughing with congestion and sore throat.  Patient states she awoke with some swelling to her lower face/around her throat this morning so she came to the emergency department for evaluation.  Denies any known fever.  Physical Exam   Triage Vital Signs: ED Triage Vitals  Enc Vitals Group     BP 06/08/22 0926 134/68     Pulse Rate 06/08/22 0926 89     Resp 06/08/22 0926 20     Temp 06/08/22 0926 98.7 F (37.1 C)     Temp src --      SpO2 06/08/22 0926 98 %     Weight 06/08/22 0927 222 lb (100.7 kg)     Height 06/08/22 0927 5\' 3"  (1.6 m)     Head Circumference --      Peak Flow --      Pain Score 06/08/22 0926 8     Pain Loc --      Pain Edu? --      Excl. in GC? --     Most recent vital signs: Vitals:   06/08/22 0926  BP: 134/68  Pulse: 89  Resp: 20  Temp: 98.7 F (37.1 C)  SpO2: 98%    General: Awake, no distress.  CV:  Good peripheral perfusion.  Regular rate and rhythm  Resp:  Normal effort.  Equal breath sounds bilaterally.  Abd:  No distention.  Soft, nontender.  No rebound or guarding. Other:  Mild pharyngeal erythema bilaterally with small tonsillar exudates bilaterally.  Moderate cervical lymphadenopathy.   ED Results / Procedures / Treatments   MEDICATIONS ORDERED IN ED: Medications - No data to  display   IMPRESSION / MDM / ASSESSMENT AND PLAN / ED COURSE  I reviewed the triage vital signs and the nursing notes.  Patient's presentation is most consistent with acute illness / injury with system symptoms.  Patient presents emergency department for 4 days of cough congestion sore throat recently traveled from [place].  Suspect upper respiratory infection.  I believe the area of swelling that the patient is referring to is more consistent with anterior cervical lymphadenopathy.  Given the patient's recent travel we will send a COVID swab.  I reviewed the patient's lab work from little over a week ago showing no acute finding.  Do not believe repeat lab work is needed at this time.  Patient states she follows up with her doctor this week for repeat lab work anyways.  We will place the patient on antibiotics as a precaution, cough medication.  Patient states she is ready to go and will follow-up with her COVID swab on MyChart.  If the COVID is positive patient will not fill the antibiotics.  FINAL CLINICAL IMPRESSION(S) / ED DIAGNOSES   Upper respiratory infection Cervical lymphadenopathy  Rx / DC Orders   Codeine/guaifenesin Keflex  Note:  This document was prepared using Dragon voice recognition software and may include unintentional dictation errors.   Minna Antis, MD 06/08/22 1116

## 2022-06-08 NOTE — ED Triage Notes (Addendum)
Pt states she has been sick since 05/10/22.  Pt states she has been coughing a lot with right rib pain. Pt states that she feels like her face looks deformed, with more swelling to the left jaw. Pt states the swelling has been going down thought. Pt states waking up with the swelling. Pt states facial numbness for over 1 week.  Pt states she is on steroids already   Speech is clear

## 2022-06-09 ENCOUNTER — Encounter (HOSPITAL_COMMUNITY): Payer: Self-pay | Admitting: Internal Medicine

## 2022-06-10 ENCOUNTER — Inpatient Hospital Stay (HOSPITAL_BASED_OUTPATIENT_CLINIC_OR_DEPARTMENT_OTHER): Payer: Medicaid Other | Admitting: Internal Medicine

## 2022-06-10 ENCOUNTER — Encounter: Payer: Self-pay | Admitting: Internal Medicine

## 2022-06-10 ENCOUNTER — Inpatient Hospital Stay: Payer: Medicaid Other

## 2022-06-10 VITALS — BP 113/63 | HR 76 | Temp 97.8°F | Resp 19 | Ht 63.0 in | Wt 227.3 lb

## 2022-06-10 DIAGNOSIS — D649 Anemia, unspecified: Secondary | ICD-10-CM | POA: Diagnosis not present

## 2022-06-10 DIAGNOSIS — D509 Iron deficiency anemia, unspecified: Secondary | ICD-10-CM | POA: Diagnosis not present

## 2022-06-10 LAB — CBC WITH DIFFERENTIAL (CANCER CENTER ONLY)
Abs Immature Granulocytes: 0.05 10*3/uL (ref 0.00–0.07)
Basophils Absolute: 0 10*3/uL (ref 0.0–0.1)
Basophils Relative: 0 %
Eosinophils Absolute: 0 10*3/uL (ref 0.0–0.5)
Eosinophils Relative: 0 %
HCT: 35.4 % — ABNORMAL LOW (ref 36.0–46.0)
Hemoglobin: 11.9 g/dL — ABNORMAL LOW (ref 12.0–15.0)
Immature Granulocytes: 1 %
Lymphocytes Relative: 39 %
Lymphs Abs: 3.4 10*3/uL (ref 0.7–4.0)
MCH: 35.6 pg — ABNORMAL HIGH (ref 26.0–34.0)
MCHC: 33.6 g/dL (ref 30.0–36.0)
MCV: 106 fL — ABNORMAL HIGH (ref 80.0–100.0)
Monocytes Absolute: 0.9 10*3/uL (ref 0.1–1.0)
Monocytes Relative: 10 %
Neutro Abs: 4.2 10*3/uL (ref 1.7–7.7)
Neutrophils Relative %: 50 %
Platelet Count: 239 10*3/uL (ref 150–400)
RBC: 3.34 MIL/uL — ABNORMAL LOW (ref 3.87–5.11)
WBC Count: 8.5 10*3/uL (ref 4.0–10.5)
nRBC: 0 % (ref 0.0–0.2)

## 2022-06-10 LAB — CMP (CANCER CENTER ONLY)
ALT: 18 U/L (ref 0–44)
AST: 21 U/L (ref 15–41)
Albumin: 3.9 g/dL (ref 3.5–5.0)
Alkaline Phosphatase: 41 U/L (ref 38–126)
Anion gap: 10 (ref 5–15)
BUN: 17 mg/dL (ref 6–20)
CO2: 26 mmol/L (ref 22–32)
Calcium: 9.2 mg/dL (ref 8.9–10.3)
Chloride: 103 mmol/L (ref 98–111)
Creatinine: 0.71 mg/dL (ref 0.44–1.00)
GFR, Estimated: >60 mL/min (ref >60)
Glucose, Bld: 132 mg/dL — ABNORMAL HIGH (ref 70–99)
Potassium: 3.6 mmol/L (ref 3.5–5.1)
Sodium: 139 mmol/L (ref 135–145)
Total Bilirubin: 0.9 mg/dL (ref 0.3–1.2)
Total Protein: 6.7 g/dL (ref 6.5–8.1)

## 2022-06-10 LAB — LACTATE DEHYDROGENASE: LDH: 300 U/L — ABNORMAL HIGH (ref 98–192)

## 2022-06-10 LAB — SAMPLE TO BLOOD BANK

## 2022-06-10 MED ORDER — PREDNISONE 20 MG PO TABS: ORAL_TABLET | ORAL | 0 refills | Status: DC

## 2022-06-10 MED ORDER — ALBUTEROL SULFATE HFA 108 (90 BASE) MCG/ACT IN AERS: 2.0000 | INHALATION_SPRAY | Freq: Four times a day (QID) | RESPIRATORY_TRACT | 2 refills | Status: DC | PRN

## 2022-06-10 NOTE — Assessment & Plan Note (Addendum)
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  JAK2 testing negative.  However bone marrow biopsy- [MAY 2024-negative for any myelophthisis process; suggestive of hemolysis] HOLD off metformin; and detromethomorphan- Bupropion. PNH testing-NEGATIVE; complement levels.;  Hemoglobin cascade; G6PD levels-pending.  April 2024 abdominal ultrasound showed mild splenomegaly.  # Currently on prednisone 100 mg/day- x 1 week- Hb today 11.9; LDH 300- improving-given poor tolerance- Will taper the dose of steroids to 60 mg/day for 1 week and then 40 mg thereafter.  Discussed with the patient that if patient's anemia gets worse given her poor tolerance to steroids she might need alternative options like IVIG.  # URI- [MAY 2024 ER]- on Keflex; and anti-tussive prn. CXR-HOLD; add albuterol prn.  If worse consider getting chest x-ray.  #Smoker- quit April 2024-  # DISPOSITION: # no blood tomorrow # follow up in 1 week- APP- labs- cbc/bmp; LDH; d-2 possible 1 unit blood # follow up in 2 week[wed/thurs]- MD- labs- cbc/bmp; LDH; d-2 possible 1 unit blood-Dr.B  # 25 minutes face-to-face with the patient discussing the above plan of care; more than 50% of time spent on prognosis/ natural history; counseling and coordination.

## 2022-06-10 NOTE — Progress Notes (Signed)
[facility] CONSULT NOTE  Patient Care Team: Armando Gang, FNP as PCP - General (Family Medicine)  CHIEF COMPLAINTS/PURPOSE OF CONSULTATION: ANEMIA  HEMATOLOGY HISTORY  # IRON DEFICIENCY ANEMIA CHRONIC [since 2019] AUG 2021- hb-9; MCV- 63; Iron sat- 3%; EGD > 7 years ago [[place]]; colonoscopy/capsule-NA; PO iron constipates.   #History of heavy menstrual cycles  HISTORY OF PRESENTING ILLNESS: Accompanied by her mother.  Ambulating independently.  Danielle [name] 47 y.o.  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  Patient started on steroids 1 week ago given consideration of hemolysis.  Patient was recently seen in the emergency room for upper respite tract infection yesterday.  Patient was started on Keflex.  Patient noted to have swelling of her face.  And also noted to have abdominal discomfort/distention.  She also Is awake numbness on the face.  Complains of ongoing fatigue.  Denies difficulty swallowing.  Appetite is good.  Review of Systems  Constitutional:  Positive for malaise/fatigue. Negative for chills, diaphoresis, fever and weight loss.  HENT:  Negative for nosebleeds and sore throat.   Eyes:  Negative for double vision.  Respiratory:  Positive for cough, shortness of breath and wheezing. Negative for hemoptysis and sputum production.   Cardiovascular:  Negative for chest pain, palpitations, orthopnea and leg swelling.  Gastrointestinal:  Positive for nausea. Negative for abdominal pain, blood in stool, constipation, diarrhea, heartburn and melena.  Genitourinary:  Negative for dysuria, frequency and urgency.  Musculoskeletal:  Positive for myalgias. Negative for back pain and joint pain.  Skin: Negative.  Negative for itching and rash.  Neurological:  Positive for dizziness and tingling. Negative for focal weakness, weakness and headaches.  Endo/Heme/Allergies:  Does not bruise/bleed easily.   Psychiatric/Behavioral:  Negative for depression. The patient has insomnia. The patient is not nervous/anxious.     MEDICAL HISTORY:  Past Medical History:  Diagnosis Date   Anemia    Depression     SURGICAL HISTORY: Past Surgical History:  Procedure Laterality Date   ABDOMINAL HYSTERECTOMY     COLONOSCOPY WITH PROPOFOL N/A 05/12/2022   Procedure: COLONOSCOPY WITH PROPOFOL;  Surgeon: Midge Minium, MD;  Location: [facility] ENDOSCOPY;  Service: Endoscopy;  Laterality: N/A;   ESOPHAGOGASTRODUODENOSCOPY (EGD) WITH PROPOFOL N/A 05/12/2022   Procedure: ESOPHAGOGASTRODUODENOSCOPY (EGD) WITH PROPOFOL;  Surgeon: Midge Minium, MD;  Location: [facility] ENDOSCOPY;  Service: Endoscopy;  Laterality: N/A;   IR BONE MARROW BIOPSY & ASPIRATION  05/28/2022   TONSILLECTOMY      SOCIAL HISTORY: Social History   Socioeconomic History   Marital status: Single    Spouse name: Not on file   Number of children: Not on file   Years of education: Not on file   Highest education level: Not on file  Occupational History   Not on file  Tobacco Use   Smoking status: Former    Packs/day: 0.50    Years: 25.00    Additional pack years: 0.00    Total pack years: 12.50    Types: Cigarettes    Quit date: 05/10/2022    Years since quitting: 0.0   Smokeless tobacco: Never  Vaping Use   Vaping Use: Never used  Substance and Sexual Activity   Alcohol use: Not Currently   Drug use: Never   Sexual activity: Not on file  Other Topics Concern   Not on file  Social History Narrative   Lives in [place] with 4 kids; stay at home [disabled son];  smokes 4-5 cigs/day; no alcohol.    Social Determinants of Health   Financial Resource Strain: Not on file  Food Insecurity: No Food Insecurity (05/11/2022)   Hunger Vital Sign    Worried About Running Out of Food in the Last Year: Never true    Ran Out of Food in the Last Year: Never true  Transportation Needs: Not on file  Physical Activity: Not on file  Stress: Not on  file  Social Connections: Not on file  Intimate Partner Violence: Not on file    FAMILY HISTORY: Family History  Problem Relation Age of Onset   Breast cancer Paternal Aunt     ALLERGIES:  has No Known Allergies.  MEDICATIONS:  Current Outpatient Medications  Medication Sig Dispense Refill   albuterol (VENTOLIN HFA) 108 (90 Base) MCG/ACT inhaler Inhale 2 puffs into the lungs every 6 (six) hours as needed for wheezing or shortness of breath. 8 g 2   cephALEXin (KEFLEX) 500 MG capsule Take 1 capsule (500 mg total) by mouth 2 (two) times daily. 14 capsule 0   Dextromethorphan-Bupropion (AUVELITY PO) Take 1 tablet by mouth as directed.     guaiFENesin-codeine 100-10 MG/5ML syrup Take 5 mLs by mouth every 6 (six) hours as needed for cough. 120 mL 0   predniSONE (DELTASONE) 20 MG tablet Take 3 pills once a day for 1 week ; and then 2 pills once a day for 1 week- once a day with food.  Do not stop until recommended. 60 tablet 0   metFORMIN (GLUCOPHAGE) 500 MG tablet Take 500 mg by mouth 2 (two) times daily with a meal. (Patient not taking: Reported on 06/10/2022)     traMADol (ULTRAM) 50 MG tablet Take 1 tablet (50 mg total) by mouth every 8 (eight) hours as needed. (Patient not taking: Reported on 06/10/2022) 30 tablet 0   No current facility-administered medications for this visit.      PHYSICAL EXAMINATION:   Vitals:   06/10/22 1021  BP: 113/63  Pulse: 76  Resp: 19  Temp: 97.8 F (36.6 C)  SpO2: 99%   Filed Weights   06/10/22 1021  Weight: 227 lb 4.8 oz (103.1 kg)    Physical Exam HENT:     Head: Normocephalic and atraumatic.     Mouth/Throat:     Pharynx: No oropharyngeal exudate.  Eyes:     Pupils: Pupils are equal, round, and reactive to light.  Cardiovascular:     Rate and Rhythm: Normal rate and regular rhythm.  Pulmonary:     Effort: Pulmonary effort is normal. No respiratory distress.     Breath sounds: Normal breath sounds. No wheezing.  Abdominal:      General: Bowel sounds are normal. There is no distension.     Palpations: Abdomen is soft. There is no mass.     Tenderness: There is no abdominal tenderness. There is no guarding or rebound.  Musculoskeletal:        General: No tenderness. Normal range of motion.     Cervical back: Normal range of motion and neck supple.  Skin:    General: Skin is warm.  Neurological:     Mental Status: She is alert and oriented to person, place, and time.  Psychiatric:        Mood and Affect: Affect normal.     LABORATORY DATA:  I have reviewed the data as listed Lab Results  Component Value Date   WBC 8.5 06/10/2022   HGB 11.9 (L)  06/10/2022   HCT 35.4 (L) 06/10/2022   MCV 106.0 (H) 06/10/2022   PLT 239 06/10/2022   Recent Labs    05/10/22 2030 05/11/22 0901 05/12/22 0458 05/16/22 1059 06/10/22 1013  NA  --  138 139 135 139  K  --  3.7 4.0 4.2 3.6  CL  --  107 105 101 103  CO2  --  25 26 26 26   GLUCOSE  --  131* 107* 110* 132*  BUN  --  9 12 12 17   CREATININE  --  0.53 0.59 0.60 0.71  CALCIUM  --  8.7* 8.9 9.2 9.2  GFRNONAA  --  >60 >60 >60 >60  PROT 6.6 6.3*  --  7.3 6.7  ALBUMIN 3.6 3.3*  --  4.3 3.9  AST 32 36  --  55* 21  ALT 25 25  --  36 18  ALKPHOS 51 53  --  62 41  BILITOT 1.5* 1.9*  --  2.3* 0.9  BILIDIR 0.2 0.3*  --   --   --   IBILI 1.3* 1.6*  --   --   --      IR BONE MARROW BIOPSY & ASPIRATION  Result Date: 05/28/2022 INDICATION: pancytopenia EXAM: Bone marrow aspiration and core biopsy using fluoroscopic guidance MEDICATIONS: None. ANESTHESIA/SEDATION: Moderate (conscious) sedation was employed during this procedure. A total of Versed 1.5 mg and Fentanyl 75 mcg was administered intravenously. Moderate Sedation Time: 12 minutes. The patient's level of consciousness and vital signs were monitored continuously by radiology nursing throughout the procedure under my direct supervision. FLUOROSCOPY TIME:  Fluoroscopy Time: 0.9 minutes (20 mGy) COMPLICATIONS: None  immediate. PROCEDURE: Informed written consent was obtained from the patient after a thorough discussion of the procedural risks, benefits and alternatives. All questions were addressed. Maximal Sterile Barrier Technique was utilized including caps, mask, sterile gowns, sterile gloves, sterile drape, hand hygiene and skin antiseptic. A timeout was performed prior to the initiation of the procedure. The patient was placed prone on the IR exam table. Limited fluoroscopy of the pelvis was performed for planning purposes. Skin entry site was marked, and the overlying skin was prepped and draped in the standard sterile fashion. Local analgesia was obtained with 1% lidocaine. Using fluoroscopic guidance, an 11 gauge needle was advanced just deep to the cortex of the right posterior ilium. Subsequently, bone marrow aspiration and core biopsy were performed. Specimens were submitted to lab/pathology for handling. Hemostasis was achieved with manual pressure, and a clean dressing was placed. The patient tolerated the procedure well without immediate complication. IMPRESSION: Successful bone marrow aspiration and core biopsy of the right posterior ilium using fluoroscopic guidance. Electronically Signed   By: Olive Bass M.D.   On: 05/28/2022 13:30   US Abdomen Complete  Result Date: 05/19/2022 CLINICAL DATA:  Cirrhosis.  Splenomegaly. EXAM: ABDOMEN ULTRASOUND COMPLETE COMPARISON:  None Available. FINDINGS: Gallbladder: No gallstones or wall thickening visualized. No sonographic Murphy sign noted by sonographer. Common bile duct: Diameter: 3.2 mm Liver: No focal lesion identified. Within normal limits in parenchymal echogenicity. Portal vein is patent on color Doppler imaging with normal direction of blood flow towards the liver. IVC: No abnormality visualized. Pancreas: Visualized portion unremarkable. Spleen: The splenic length is 13.04 cm. The splenic volume is 402.2 cc. Right Kidney: Length: 10.7 cm. Echogenicity  within normal limits. No mass or hydronephrosis visualized. Left Kidney: Length: 11.4 cm. Echogenicity within normal limits. No mass or hydronephrosis visualized. Abdominal aorta: No aneurysm visualized. Other findings:  None. IMPRESSION: 1. The spleen is borderline/prominent in size with a length of 13.04 cm and a volume of 402 cc. A diagnosis of splenomegaly with ultrasound requires a length of greater than 13 cm and a volume greater than 411 cc. 2. No other abnormalities. Electronically Signed   By: Gerome Sam III M.D.   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  Unclear etiology. 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  JAK2 testing negative.  However bone marrow biopsy- [MAY 2024-negative for any myelophthisis process; suggestive of hemolysis] HOLD off metformin; and detromethomorphan- Bupropion. PNH testing-NEGATIVE; complement levels.;  Hemoglobin cascade; G6PD levels-pending.  April 2024 abdominal ultrasound showed mild splenomegaly.  # Currently on prednisone 100 mg/day- x 1 week- Hb today 11.9; LDH 300- improving-given poor tolerance- Will taper the dose of steroids to 60 mg/day for 1 week and then 40 mg thereafter.  Discussed with the patient that if patient's anemia gets worse given her poor tolerance to steroids she might need alternative options like IVIG.  # URI- [MAY 2024 ER]- on Keflex; and anti-tussive prn. CXR-HOLD; add albuterol prn.  If worse consider getting chest x-ray.  #Smoker- quit April 2024-  # DISPOSITION: # no blood tomorrow # follow up in 1 week- APP- labs- cbc/bmp; LDH; d-2 possible 1 unit blood # follow up in 2 week[wed/thurs]- MD- labs- cbc/bmp; LDH; d-2 possible 1 unit blood-Dr.B  # 25 minutes face-to-face with the patient discussing the above plan of care; more than 50% of time spent on  prognosis/ natural history; counseling and coordination.   All questions were answered. The patient knows to call the clinic with any problems, questions or concerns.    Earna Coder, MD 06/10/2022 1:02 PM

## 2022-06-10 NOTE — Progress Notes (Signed)
Patient states she has been feeling numbness in the lower part of the face. Patient states she went to er on 5/12 because she had swelling on the right side (size of a golf ball) and left side (size of a tennis ball) of the face. Patient states they gave her antibiotics and said it was because she has a upper respiratory infection.  Patient states her rib cage feel like it broken on the left side because of the way it hurts.   Patient states her abdominal is hard when its usually soft.  Patient states her breathing is back to "some what normal" Patient states she overall do not feel like herself.

## 2022-06-11 ENCOUNTER — Inpatient Hospital Stay: Payer: Medicaid Other

## 2022-06-11 LAB — C3 COMPLEMENT: C3 Complement: 104 mg/dL (ref 82–167)

## 2022-06-11 LAB — C4 COMPLEMENT: Complement C4, Body Fluid: 23 mg/dL (ref 12–38)

## 2022-06-12 LAB — COMPLEMENT, TOTAL: Compl, Total (CH50): 45 U/mL (ref >41)

## 2022-06-12 LAB — G-6-PD, QUANT, BLOOD AND RBC
G-6-PD, Quant: 399 U/10E12 RBC (ref 127–427)
RBC: 3.3 x10E6/uL — ABNORMAL LOW (ref 3.77–5.28)

## 2022-06-13 ENCOUNTER — Encounter (HOSPITAL_COMMUNITY): Payer: Self-pay | Admitting: Internal Medicine

## 2022-06-13 LAB — HGB FRACTIONATION CASCADE
Hgb A2: 3.2 % (ref 1.8–3.2)
Hgb A: 67.2 % — ABNORMAL LOW (ref 96.4–98.8)
Hgb F: 0 % (ref 0.0–2.0)
Hgb S: 29.6 % — ABNORMAL HIGH

## 2022-06-13 LAB — HGB SOLUBILITY: Hgb Solubility: POSITIVE — AB

## 2022-06-17 ENCOUNTER — Encounter: Payer: Self-pay | Admitting: Nurse Practitioner

## 2022-06-17 ENCOUNTER — Inpatient Hospital Stay: Payer: Medicaid Other

## 2022-06-17 ENCOUNTER — Inpatient Hospital Stay (HOSPITAL_BASED_OUTPATIENT_CLINIC_OR_DEPARTMENT_OTHER): Payer: Medicaid Other | Admitting: Nurse Practitioner

## 2022-06-17 ENCOUNTER — Other Ambulatory Visit: Payer: Self-pay

## 2022-06-17 VITALS — BP 102/60 | HR 81 | Temp 97.7°F | Wt 233.0 lb

## 2022-06-17 DIAGNOSIS — D539 Nutritional anemia, unspecified: Secondary | ICD-10-CM

## 2022-06-17 DIAGNOSIS — D649 Anemia, unspecified: Secondary | ICD-10-CM | POA: Diagnosis not present

## 2022-06-17 DIAGNOSIS — D509 Iron deficiency anemia, unspecified: Secondary | ICD-10-CM | POA: Diagnosis not present

## 2022-06-17 LAB — CBC WITH DIFFERENTIAL (CANCER CENTER ONLY)
Abs Immature Granulocytes: 0.03 10*3/uL (ref 0.00–0.07)
Basophils Absolute: 0 10*3/uL (ref 0.0–0.1)
Basophils Relative: 0 %
Eosinophils Absolute: 0.1 10*3/uL (ref 0.0–0.5)
Eosinophils Relative: 1 %
HCT: 35.3 % — ABNORMAL LOW (ref 36.0–46.0)
Hemoglobin: 11.9 g/dL — ABNORMAL LOW (ref 12.0–15.0)
Immature Granulocytes: 0 %
Lymphocytes Relative: 28 %
Lymphs Abs: 2.6 10*3/uL (ref 0.7–4.0)
MCH: 35.4 pg — ABNORMAL HIGH (ref 26.0–34.0)
MCHC: 33.7 g/dL (ref 30.0–36.0)
MCV: 105.1 fL — ABNORMAL HIGH (ref 80.0–100.0)
Monocytes Absolute: 1 10*3/uL (ref 0.1–1.0)
Monocytes Relative: 10 %
Neutro Abs: 5.7 10*3/uL (ref 1.7–7.7)
Neutrophils Relative %: 61 %
Platelet Count: 249 10*3/uL (ref 150–400)
RBC: 3.36 MIL/uL — ABNORMAL LOW (ref 3.87–5.11)
RDW: 21.1 % — ABNORMAL HIGH (ref 11.5–15.5)
WBC Count: 9.4 10*3/uL (ref 4.0–10.5)
nRBC: 0 % (ref 0.0–0.2)

## 2022-06-17 LAB — BASIC METABOLIC PANEL
Anion gap: 12 (ref 5–15)
BUN: 21 mg/dL — ABNORMAL HIGH (ref 6–20)
CO2: 24 mmol/L (ref 22–32)
Calcium: 8.8 mg/dL — ABNORMAL LOW (ref 8.9–10.3)
Chloride: 104 mmol/L (ref 98–111)
Creatinine, Ser: 0.75 mg/dL (ref 0.44–1.00)
GFR, Estimated: >60 mL/min (ref >60)
Glucose, Bld: 109 mg/dL — ABNORMAL HIGH (ref 70–99)
Potassium: 3.8 mmol/L (ref 3.5–5.1)
Sodium: 140 mmol/L (ref 135–145)

## 2022-06-17 LAB — LACTATE DEHYDROGENASE: LDH: 196 U/L — ABNORMAL HIGH (ref 98–192)

## 2022-06-17 LAB — SAMPLE TO BLOOD BANK

## 2022-06-17 NOTE — Progress Notes (Signed)
[facility] CONSULT NOTE  Patient Care Team: Armando Gang, FNP as PCP - General (Family Medicine)  CHIEF COMPLAINTS/PURPOSE OF CONSULTATION: ANEMIA  HEMATOLOGY HISTORY  # IRON DEFICIENCY ANEMIA CHRONIC [since 2019] AUG 2021- hb-9; MCV- 63; Iron sat- 3%; EGD > 7 years ago [[place]]; colonoscopy/capsule-NA; PO iron constipates.   #History of heavy menstrual cycles  HISTORY OF PRESENTING ILLNESS: Accompanied by her mother. Ambulating independently.  Danielle [name] 47 y.o. 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  Patient started on steroids 1 week ago given consideration of hemolysis. She has tapered to prednisone 60 mg as of Monday. Generalized fluid retention improving. Fatigue has improved. Feels better. Was able to travel to [facility] for graduation of her daughter and Danielle [name] for her father's funeral. Her mother is now visiting from [place]    Review of Systems  Constitutional:  Positive for malaise/fatigue. Negative for chills, diaphoresis, fever and weight loss.  HENT:  Negative for nosebleeds and sore throat.   Eyes:  Negative for double vision.  Respiratory:  Positive for shortness of breath. Negative for cough, hemoptysis, sputum production and wheezing.   Cardiovascular:  Negative for chest pain, palpitations, orthopnea and leg swelling.  Gastrointestinal:  Negative for abdominal pain, blood in stool, constipation, diarrhea, heartburn, melena and nausea.  Genitourinary:  Negative for dysuria, frequency and urgency.  Musculoskeletal:  Negative for back pain, joint pain and myalgias.  Skin: Negative.  Negative for itching and rash.  Neurological:  Positive for tingling. Negative for dizziness, focal weakness, weakness and headaches.  Endo/Heme/Allergies:  Does not bruise/bleed easily.  Psychiatric/Behavioral:  Negative for depression. The patient is nervous/anxious. The patient does not have insomnia.     MEDICAL  HISTORY:  Past Medical History:  Diagnosis Date   Anemia    Depression     SURGICAL HISTORY: Past Surgical History:  Procedure Laterality Date   ABDOMINAL HYSTERECTOMY     COLONOSCOPY WITH PROPOFOL N/A 05/12/2022   Procedure: COLONOSCOPY WITH PROPOFOL;  Surgeon: Midge Minium, MD;  Location: [facility] ENDOSCOPY;  Service: Endoscopy;  Laterality: N/A;   ESOPHAGOGASTRODUODENOSCOPY (EGD) WITH PROPOFOL N/A 05/12/2022   Procedure: ESOPHAGOGASTRODUODENOSCOPY (EGD) WITH PROPOFOL;  Surgeon: Midge Minium, MD;  Location: [facility] ENDOSCOPY;  Service: Endoscopy;  Laterality: N/A;   IR BONE MARROW BIOPSY & ASPIRATION  05/28/2022   TONSILLECTOMY      SOCIAL HISTORY: Social History   Socioeconomic History   Marital status: Single    Spouse name: Not on file   Number of children: Not on file   Years of education: Not on file   Highest education level: Not on file  Occupational History   Not on file  Tobacco Use   Smoking status: Former    Packs/day: 0.50    Years: 25.00    Additional pack years: 0.00    Total pack years: 12.50    Types: Cigarettes    Quit date: 05/10/2022    Years since quitting: 0.1   Smokeless tobacco: Never  Vaping Use   Vaping Use: Never used  Substance and Sexual Activity   Alcohol use: Not Currently   Drug use: Never   Sexual activity: Not on file  Other Topics Concern   Not on file  Social History Narrative   Lives in [place] with 4 kids; stay at home [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    Social Determinants of Health   Financial Resource Strain: Not on file  Food Insecurity: No Food Insecurity (05/11/2022)   Hunger Vital Sign    Worried About Running Out of Food in the Last Year: Never true    Ran Out of Food in the Last Year: Never true  Transportation Needs: Not on file  Physical Activity: Not on file  Stress: Not on file  Social Connections: Not on file  Intimate Partner Violence: Not on file    FAMILY HISTORY: Family History  Problem  Relation Age of Onset   Breast cancer Paternal Aunt     ALLERGIES:  has No Known Allergies.  MEDICATIONS:  Current Outpatient Medications  Medication Sig Dispense Refill   albuterol (VENTOLIN HFA) 108 (90 Base) MCG/ACT inhaler Inhale 2 puffs into the lungs every 6 (six) hours as needed for wheezing or shortness of breath. 8 g 2   cephALEXin (KEFLEX) 500 MG capsule Take 1 capsule (500 mg total) by mouth 2 (two) times daily. 14 capsule 0   Dextromethorphan-Bupropion (AUVELITY PO) Take 1 tablet by mouth as directed.     guaiFENesin-codeine 100-10 MG/5ML syrup Take 5 mLs by mouth every 6 (six) hours as needed for cough. 120 mL 0   predniSONE (DELTASONE) 20 MG tablet Take 3 pills once a day for 1 week ; and then 2 pills once a day for 1 week- once a day with food.  Do not stop until recommended. 60 tablet 0   metFORMIN (GLUCOPHAGE) 500 MG tablet Take 500 mg by mouth 2 (two) times daily with a meal. (Patient not taking: Reported on 06/10/2022)     traMADol (ULTRAM) 50 MG tablet Take 1 tablet (50 mg total) by mouth every 8 (eight) hours as needed. (Patient not taking: Reported on 06/10/2022) 30 tablet 0   No current facility-administered medications for this visit.      PHYSICAL EXAMINATION: Vitals:   06/17/22 1012  BP: 102/60  Pulse: 81  Temp: 97.7 F (36.5 C)  SpO2: 99%   Filed Weights   06/17/22 1012  Weight: 233 lb (105.7 kg)    Physical Exam Constitutional:      Appearance: She is not ill-appearing.  HENT:     Head: Normocephalic and atraumatic.  Cardiovascular:     Rate and Rhythm: Normal rate and regular rhythm.  Pulmonary:     Effort: Pulmonary effort is normal. No respiratory distress.     Breath sounds: No wheezing.  Abdominal:     General: There is no distension.     Palpations: Abdomen is soft.     Tenderness: There is no abdominal tenderness. There is no guarding.  Musculoskeletal:        General: No tenderness or deformity.  Skin:    General: Skin is warm.      Coloration: Skin is not pale.  Neurological:     Mental Status: She is alert and oriented to person, place, and time.  Psychiatric:        Mood and Affect: Mood and affect normal.        Behavior: Behavior normal.     LABORATORY DATA:  I have reviewed the data as listed Lab Results  Component Value Date   WBC 9.4 06/17/2022   HGB 11.9 (L) 06/17/2022   HCT 35.3 (L) 06/17/2022   MCV 105.1 (H) 06/17/2022   PLT 249 06/17/2022   Recent Labs    05/10/22 2030 05/11/22 0901 05/12/22 0458 05/16/22 1059 06/10/22 1013 06/17/22 0959  NA  --  138   < > 135 139 140  K  --  3.7   < > 4.2 3.6 3.8  CL  --  107   < > 101 103 104  CO2  --  25   < > 26 26 24   GLUCOSE  --  131*   < > 110* 132* 109*  BUN  --  9   < > 12 17 21*  CREATININE  --  0.53   < > 0.60 0.71 0.75  CALCIUM  --  8.7*   < > 9.2 9.2 8.8*  GFRNONAA  --  >60   < > >60 >60 >60  PROT 6.6 6.3*  --  7.3 6.7  --   ALBUMIN 3.6 3.3*  --  4.3 3.9  --   AST 32 36  --  55* 21  --   ALT 25 25  --  36 18  --   ALKPHOS 51 53  --  62 41  --   BILITOT 1.5* 1.9*  --  2.3* 0.9  --   BILIDIR 0.2 0.3*  --   --   --   --   IBILI 1.3* 1.6*  --   --   --   --    < > = values in this interval not displayed.      IR BONE MARROW BIOPSY & ASPIRATION  Result Date: 05/28/2022 INDICATION: pancytopenia EXAM: Bone marrow aspiration and core biopsy using fluoroscopic guidance MEDICATIONS: None. ANESTHESIA/SEDATION: Moderate (conscious) sedation was employed during this procedure. A total of Versed 1.5 mg and Fentanyl 75 mcg was administered intravenously. Moderate Sedation Time: 12 minutes. The patient's level of consciousness and vital signs were monitored continuously by radiology nursing throughout the procedure under my direct supervision. FLUOROSCOPY TIME:  Fluoroscopy Time: 0.9 minutes (20 mGy) COMPLICATIONS: None immediate. PROCEDURE: Informed written consent was obtained from the patient after a thorough discussion of the procedural risks,  benefits and alternatives. All questions were addressed. Maximal Sterile Barrier Technique was utilized including caps, mask, sterile gowns, sterile gloves, sterile drape, hand hygiene and skin antiseptic. A timeout was performed prior to the initiation of the procedure. The patient was placed prone on the IR exam table. Limited fluoroscopy of the pelvis was performed for planning purposes. Skin entry site was marked, and the overlying skin was prepped and draped in the standard sterile fashion. Local analgesia was obtained with 1% lidocaine. Using fluoroscopic guidance, an 11 gauge needle was advanced just deep to the cortex of the right posterior ilium. Subsequently, bone marrow aspiration and core biopsy were performed. Specimens were submitted to lab/pathology for handling. Hemostasis was achieved with manual pressure, and a clean dressing was placed. The patient tolerated the procedure well without immediate complication. IMPRESSION: Successful bone marrow aspiration and core biopsy of the right posterior ilium using fluoroscopic guidance. Electronically Signed   By: Olive Bass M.D.   On: 05/28/2022 13:30   US Abdomen Complete  Result Date: 05/19/2022 CLINICAL DATA:  Cirrhosis.  Splenomegaly. EXAM: ABDOMEN ULTRASOUND COMPLETE COMPARISON:  None Available. FINDINGS: Gallbladder: No gallstones or wall thickening visualized. No sonographic Murphy sign noted by sonographer. Common bile duct: Diameter: 3.2 mm Liver: No focal lesion identified. Within normal limits in parenchymal echogenicity. Portal vein is patent on color Doppler imaging with normal direction of blood flow towards the liver. IVC: No abnormality visualized. Pancreas: Visualized portion unremarkable. Spleen: The splenic length is 13.04 cm. The splenic volume is 402.2 cc. Right Kidney: Length: 10.7 cm. Echogenicity within normal limits. No mass or hydronephrosis  visualized. Left Kidney: Length: 11.4 cm. Echogenicity within normal limits. No  mass or hydronephrosis visualized. Abdominal aorta: No aneurysm visualized. Other findings: None. IMPRESSION: 1. The spleen is borderline/prominent in size with a length of 13.04 cm and a volume of 402 cc. A diagnosis of splenomegaly with ultrasound requires a length of greater than 13 cm and a volume greater than 411 cc. 2. No other abnormalities. Electronically Signed   By: Gerome Sam III M.D.   On: 05/19/2022 16:55     Assessment & Plan:  No problem-specific Assessment & Plan notes found for this encounter.  Symptomatic anemia # APRIL 2024- Severe hemolytic anemia-DAT negative.  Unclear etiology. 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  JAK2 testing negative.  However bone marrow biopsy- [MAY 2024-negative for any myelophthisis process; suggestive of hemolysis] HOLD off metformin; and detromethomorphan- Bupropion. PNH testing-NEGATIVE; complement levels. Hemoglobin cascade; G6PD levels-normal.  April 2024 abdominal ultrasound showed mild splenomegaly.   # S/p prednisone 100 mg/day- x 1 week, Hmg 11.9, LDH 300. Now on prednisone 60 mg/day till Monday then decreased to 40 mg thereafter. Hmg stable at 11.9. LDH improving to 196. Plan to taper steroids as planned. If anemia worsens or she has poor tolerance to steroids, would consider alternative such as IVIG.    # URI- [MAY 2024 ER]- s/p keflex and anti-tussive. Now discontinued. SOB improving. If symptoms recur, consider chest xray.    #Smoker- quit April 2024   # DISPOSITION: # no blood tomorrow # follow up in 1 week as scheduled w/ poss blood on D2- la  All questions were answered. The patient knows to call the clinic with any problems, questions or concerns.   Alinda Dooms, NP 06/17/2022

## 2022-06-19 ENCOUNTER — Inpatient Hospital Stay: Payer: Medicaid Other

## 2022-06-25 ENCOUNTER — Encounter: Payer: Self-pay | Admitting: Internal Medicine

## 2022-06-25 ENCOUNTER — Inpatient Hospital Stay (HOSPITAL_BASED_OUTPATIENT_CLINIC_OR_DEPARTMENT_OTHER): Payer: Medicaid Other | Admitting: Internal Medicine

## 2022-06-25 ENCOUNTER — Inpatient Hospital Stay: Payer: Medicaid Other

## 2022-06-25 VITALS — BP 127/68 | HR 71 | Temp 97.6°F | Ht 63.0 in

## 2022-06-25 DIAGNOSIS — D649 Anemia, unspecified: Secondary | ICD-10-CM

## 2022-06-25 DIAGNOSIS — D509 Iron deficiency anemia, unspecified: Secondary | ICD-10-CM | POA: Diagnosis not present

## 2022-06-25 LAB — SAMPLE TO BLOOD BANK

## 2022-06-25 LAB — BASIC METABOLIC PANEL - CANCER CENTER ONLY
Anion gap: 8 (ref 5–15)
BUN: 19 mg/dL (ref 6–20)
CO2: 26 mmol/L (ref 22–32)
Calcium: 8.9 mg/dL (ref 8.9–10.3)
Chloride: 101 mmol/L (ref 98–111)
Creatinine: 0.76 mg/dL (ref 0.44–1.00)
GFR, Estimated: >60 mL/min (ref >60)
Glucose, Bld: 99 mg/dL (ref 70–99)
Potassium: 4 mmol/L (ref 3.5–5.1)
Sodium: 135 mmol/L (ref 135–145)

## 2022-06-25 LAB — CBC WITH DIFFERENTIAL (CANCER CENTER ONLY)
Abs Immature Granulocytes: 0.02 10*3/uL (ref 0.00–0.07)
Basophils Absolute: 0 10*3/uL (ref 0.0–0.1)
Basophils Relative: 0 %
Eosinophils Absolute: 0.1 10*3/uL (ref 0.0–0.5)
Eosinophils Relative: 1 %
HCT: 36.2 % (ref 36.0–46.0)
Hemoglobin: 12.4 g/dL (ref 12.0–15.0)
Immature Granulocytes: 0 %
Lymphocytes Relative: 27 %
Lymphs Abs: 2.1 10*3/uL (ref 0.7–4.0)
MCH: 34.5 pg — ABNORMAL HIGH (ref 26.0–34.0)
MCHC: 34.3 g/dL (ref 30.0–36.0)
MCV: 100.8 fL — ABNORMAL HIGH (ref 80.0–100.0)
Monocytes Absolute: 0.7 10*3/uL (ref 0.1–1.0)
Monocytes Relative: 10 %
Neutro Abs: 4.8 10*3/uL (ref 1.7–7.7)
Neutrophils Relative %: 62 %
Platelet Count: 199 10*3/uL (ref 150–400)
RBC: 3.59 MIL/uL — ABNORMAL LOW (ref 3.87–5.11)
RDW: 17.8 % — ABNORMAL HIGH (ref 11.5–15.5)
WBC Count: 7.7 10*3/uL (ref 4.0–10.5)
nRBC: 0 % (ref 0.0–0.2)

## 2022-06-25 LAB — LACTATE DEHYDROGENASE: LDH: 189 U/L (ref 98–192)

## 2022-06-25 MED ORDER — PREDNISONE 20 MG PO TABS: ORAL_TABLET | ORAL | 0 refills | Status: DC

## 2022-06-25 NOTE — Progress Notes (Signed)
Fatigue/weakness: yes Dyspena: yes Light headedness: yes with blurry vision Blood in stool: no  C/o facial numbness and blurry vision at times.

## 2022-06-25 NOTE — Patient Instructions (Signed)
#   Start taking prednisone 20 mg a day for 1 week; and then 10 mg once a day-do not stop until directed.

## 2022-06-25 NOTE — Assessment & Plan Note (Addendum)
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  JAK2 testing negative.  However bone marrow biopsy- [MAY 2024-negative for any myelophthisis process; suggestive of hemolysis] HOLD off metformin; and detromethomorphan- Bupropion. PNH testing-NEGATIVE; complement levels.;  Hemoglobin cascade; G6PD levels-pending.  April 2024 abdominal ultrasound showed mild splenomegaly.  # Currently on prednisone 40 mg/day-- Hb today 12.3; LDH -189- WNL. Improving-given poor tolerance. # Start taking prednisone 20 mg a day for 1 week; and then 10 mg once a day-do not stop until directed.    # Discussed with the patient that if patient's anemia gets worse given her poor tolerance to steroids she might need alternative options like IVIG.  #Smoker- quit April 2024-  # DISPOSITION: # no blood tomorrow # follow up in 1 week-  labs- cbc/ LDH; haptoglobin # follow up in 2 week[wed/thurs]- MD- labs- cbc/bmp; LDH- Dr.B  # 25 minutes face-to-face with the patient discussing the above plan of care; more than 50% of time spent on prognosis/ natural history; counseling and coordination.

## 2022-06-25 NOTE — Progress Notes (Signed)
[facility] CONSULT NOTE  Patient Care Team: Armando Gang, FNP as PCP - General (Family Medicine)  CHIEF COMPLAINTS/PURPOSE OF CONSULTATION: ANEMIA  HEMATOLOGY HISTORY  # IRON DEFICIENCY ANEMIA CHRONIC [since 2019] AUG 2021- hb-9; MCV- 63; Iron sat- 3%; EGD > 7 years ago [[place]]; colonoscopy/capsule-NA; PO iron constipates.   #History of heavy menstrual cycles  HISTORY OF PRESENTING ILLNESS: Accompanied by her mother.  Ambulating independently.  Danielle [name] 47 y.o.  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    Patient currently on prednisone 40 mg a aday.  Patient noted to have swelling of her face.  Continues to have abdominal discomfort/distention.  She also  notes to have numbness on the face.  Denies difficulty swallowing.  Appetite is good.  Review of Systems  Constitutional:  Positive for malaise/fatigue. Negative for chills, diaphoresis, fever and weight loss.  HENT:  Negative for nosebleeds and sore throat.   Eyes:  Negative for double vision.  Respiratory:  Positive for cough, shortness of breath and wheezing. Negative for hemoptysis and sputum production.   Cardiovascular:  Negative for chest pain, palpitations, orthopnea and leg swelling.  Gastrointestinal:  Positive for nausea. Negative for abdominal pain, blood in stool, constipation, diarrhea, heartburn and melena.  Genitourinary:  Negative for dysuria, frequency and urgency.  Musculoskeletal:  Positive for myalgias. Negative for back pain and joint pain.  Skin: Negative.  Negative for itching and rash.  Neurological:  Positive for dizziness and tingling. Negative for focal weakness, weakness and headaches.  Endo/Heme/Allergies:  Does not bruise/bleed easily.  Psychiatric/Behavioral:  Negative for depression. The patient has insomnia. The patient is not nervous/anxious.     MEDICAL HISTORY:  Past Medical History:  Diagnosis  Date   Anemia    Depression     SURGICAL HISTORY: Past Surgical History:  Procedure Laterality Date   ABDOMINAL HYSTERECTOMY     COLONOSCOPY WITH PROPOFOL N/A 05/12/2022   Procedure: COLONOSCOPY WITH PROPOFOL;  Surgeon: Midge Minium, MD;  Location: [facility] ENDOSCOPY;  Service: Endoscopy;  Laterality: N/A;   ESOPHAGOGASTRODUODENOSCOPY (EGD) WITH PROPOFOL N/A 05/12/2022   Procedure: ESOPHAGOGASTRODUODENOSCOPY (EGD) WITH PROPOFOL;  Surgeon: Midge Minium, MD;  Location: [facility] ENDOSCOPY;  Service: Endoscopy;  Laterality: N/A;   IR BONE MARROW BIOPSY & ASPIRATION  05/28/2022   TONSILLECTOMY      SOCIAL HISTORY: Social History   Socioeconomic History   Marital status: Single    Spouse name: Not on file   Number of children: Not on file   Years of education: Not on file   Highest education level: Not on file  Occupational History   Not on file  Tobacco Use   Smoking status: Former    Packs/day: 0.50    Years: 25.00    Additional pack years: 0.00    Total pack years: 12.50    Types: Cigarettes    Quit date: 05/10/2022    Years since quitting: 0.1   Smokeless tobacco: Never  Vaping Use   Vaping Use: Never used  Substance and Sexual Activity   Alcohol use: Not Currently   Drug use: Never   Sexual activity: Not on file  Other Topics Concern   Not on file  Social History Narrative   Lives in [place] with 4 kids; stay at home [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    Social Determinants of Health   Financial Resource Strain: Not on file  Food Insecurity: No Food  Insecurity (05/11/2022)   Hunger Vital Sign    Worried About Running Out of Food in the Last Year: Never true    Ran Out of Food in the Last Year: Never true  Transportation Needs: Not on file  Physical Activity: Not on file  Stress: Not on file  Social Connections: Not on file  Intimate Partner Violence: Not on file    FAMILY HISTORY: Family History  Problem Relation Age of Onset   Breast cancer  Paternal Aunt     ALLERGIES:  has No Known Allergies.  MEDICATIONS:  Current Outpatient Medications  Medication Sig Dispense Refill   albuterol (VENTOLIN HFA) 108 (90 Base) MCG/ACT inhaler Inhale 2 puffs into the lungs every 6 (six) hours as needed for wheezing or shortness of breath. 8 g 2   traMADol (ULTRAM) 50 MG tablet Take 1 tablet (50 mg total) by mouth every 8 (eight) hours as needed. 30 tablet 0   Dextromethorphan-Bupropion (AUVELITY PO) Take 1 tablet by mouth as directed. (Patient not taking: Reported on 06/25/2022)     metFORMIN (GLUCOPHAGE) 500 MG tablet Take 500 mg by mouth 2 (two) times daily with a meal. (Patient not taking: Reported on 06/10/2022)     predniSONE (DELTASONE) 20 MG tablet Take prednisone 20 mg a day for 1 week; and then 10 mg once a day-do not stop until directed. 60 tablet 0   No current facility-administered medications for this visit.      PHYSICAL EXAMINATION:   Vitals:   06/25/22 0948  BP: 127/68  Pulse: 71  Temp: 97.6 F (36.4 C)  SpO2: 98%   Filed Weights    Physical Exam HENT:     Head: Normocephalic and atraumatic.     Mouth/Throat:     Pharynx: No oropharyngeal exudate.  Eyes:     Pupils: Pupils are equal, round, and reactive to light.  Cardiovascular:     Rate and Rhythm: Normal rate and regular rhythm.  Pulmonary:     Effort: Pulmonary effort is normal. No respiratory distress.     Breath sounds: Normal breath sounds. No wheezing.  Abdominal:     General: Bowel sounds are normal. There is no distension.     Palpations: Abdomen is soft. There is no mass.     Tenderness: There is no abdominal tenderness. There is no guarding or rebound.  Musculoskeletal:        General: No tenderness. Normal range of motion.     Cervical back: Normal range of motion and neck supple.  Skin:    General: Skin is warm.  Neurological:     Mental Status: She is alert and oriented to person, place, and time.  Psychiatric:        Mood and Affect:  Affect normal.     LABORATORY DATA:  I have reviewed the data as listed Lab Results  Component Value Date   WBC 7.7 06/25/2022   HGB 12.4 06/25/2022   HCT 36.2 06/25/2022   MCV 100.8 (H) 06/25/2022   PLT 199 06/25/2022   Recent Labs    05/10/22 2030 05/11/22 0901 05/12/22 0458 05/16/22 1059 06/10/22 1013 06/17/22 0959 06/25/22 0950  NA  --  138   < > 135 139 140 135  K  --  3.7   < > 4.2 3.6 3.8 4.0  CL  --  107   < > 101 103 104 101  CO2  --  25   < > 26 26 24 26   GLUCOSE  --  131*   < > 110* 132* 109* 99  BUN  --  9   < > 12 17 21* 19  CREATININE  --  0.53   < > 0.60 0.71 0.75 0.76  CALCIUM  --  8.7*   < > 9.2 9.2 8.8* 8.9  GFRNONAA  --  >60   < > >60 >60 >60 >60  PROT 6.6 6.3*  --  7.3 6.7  --   --   ALBUMIN 3.6 3.3*  --  4.3 3.9  --   --   AST 32 36  --  55* 21  --   --   ALT 25 25  --  36 18  --   --   ALKPHOS 51 53  --  62 41  --   --   BILITOT 1.5* 1.9*  --  2.3* 0.9  --   --   BILIDIR 0.2 0.3*  --   --   --   --   --   IBILI 1.3* 1.6*  --   --   --   --   --    < > = values in this interval not displayed.     IR BONE MARROW BIOPSY & ASPIRATION  Result Date: 05/28/2022 INDICATION: pancytopenia EXAM: Bone marrow aspiration and core biopsy using fluoroscopic guidance MEDICATIONS: None. ANESTHESIA/SEDATION: Moderate (conscious) sedation was employed during this procedure. A total of Versed 1.5 mg and Fentanyl 75 mcg was administered intravenously. Moderate Sedation Time: 12 minutes. The patient's level of consciousness and vital signs were monitored continuously by radiology nursing throughout the procedure under my direct supervision. FLUOROSCOPY TIME:  Fluoroscopy Time: 0.9 minutes (20 mGy) COMPLICATIONS: None immediate. PROCEDURE: Informed written consent was obtained from the patient after a thorough discussion of the procedural risks, benefits and alternatives. All questions were addressed. Maximal Sterile Barrier Technique was utilized including caps, mask,  sterile gowns, sterile gloves, sterile drape, hand hygiene and skin antiseptic. A timeout was performed prior to the initiation of the procedure. The patient was placed prone on the IR exam table. Limited fluoroscopy of the pelvis was performed for planning purposes. Skin entry site was marked, and the overlying skin was prepped and draped in the standard sterile fashion. Local analgesia was obtained with 1% lidocaine. Using fluoroscopic guidance, an 11 gauge needle was advanced just deep to the cortex of the right posterior ilium. Subsequently, bone marrow aspiration and core biopsy were performed. Specimens were submitted to lab/pathology for handling. Hemostasis was achieved with manual pressure, and a clean dressing was placed. The patient tolerated the procedure well without immediate complication. IMPRESSION: Successful bone marrow aspiration and core biopsy of the right posterior ilium using fluoroscopic guidance. Electronically Signed   By: Olive Bass M.D.   On: 05/28/2022 13:30    Symptomatic anemia # APRIL 2024- Severe hemolytic anemia-DAT negative.  Unclear etiology. 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  JAK2 testing negative.  However bone marrow biopsy- [MAY 2024-negative for any myelophthisis process; suggestive of hemolysis] HOLD off metformin; and detromethomorphan- Bupropion. PNH testing-NEGATIVE; complement levels.;  Hemoglobin cascade; G6PD levels-pending.  April 2024 abdominal ultrasound showed mild splenomegaly.  # Currently on prednisone 40 mg/day-- Hb today 12.3; LDH -189- WNL. Improving-given poor tolerance. # Start taking prednisone 20 mg a day for 1 week; and then 10 mg once a day-do not stop until directed.    #  Discussed with the patient that if patient's anemia gets worse given her poor tolerance to steroids she might need alternative options like  IVIG.  #Smoker- quit April 2024-  # DISPOSITION: # no blood tomorrow # follow up in 1 week-  labs- cbc/ LDH; haptoglobin # follow up in 2 week[wed/thurs]- MD- labs- cbc/bmp; LDH- Dr.B  # 25 minutes face-to-face with the patient discussing the above plan of care; more than 50% of time spent on prognosis/ natural history; counseling and coordination.   All questions were answered. The patient knows to call the clinic with any problems, questions or concerns.    Earna Coder, MD 06/25/2022 11:14 AM

## 2022-06-26 ENCOUNTER — Inpatient Hospital Stay: Payer: Medicaid Other

## 2022-07-02 ENCOUNTER — Inpatient Hospital Stay: Payer: Medicaid Other | Attending: Internal Medicine

## 2022-07-02 DIAGNOSIS — D509 Iron deficiency anemia, unspecified: Secondary | ICD-10-CM | POA: Insufficient documentation

## 2022-07-02 DIAGNOSIS — R200 Anesthesia of skin: Secondary | ICD-10-CM | POA: Insufficient documentation

## 2022-07-02 DIAGNOSIS — D649 Anemia, unspecified: Secondary | ICD-10-CM

## 2022-07-02 DIAGNOSIS — H538 Other visual disturbances: Secondary | ICD-10-CM | POA: Insufficient documentation

## 2022-07-02 LAB — CBC WITH DIFFERENTIAL (CANCER CENTER ONLY)
Abs Immature Granulocytes: 0.02 10*3/uL (ref 0.00–0.07)
Basophils Absolute: 0 10*3/uL (ref 0.0–0.1)
Basophils Relative: 0 %
Eosinophils Absolute: 0.1 10*3/uL (ref 0.0–0.5)
Eosinophils Relative: 1 %
HCT: 38.1 % (ref 36.0–46.0)
Hemoglobin: 13.1 g/dL (ref 12.0–15.0)
Immature Granulocytes: 0 %
Lymphocytes Relative: 29 %
Lymphs Abs: 2.1 10*3/uL (ref 0.7–4.0)
MCH: 33.7 pg (ref 26.0–34.0)
MCHC: 34.4 g/dL (ref 30.0–36.0)
MCV: 97.9 fL (ref 80.0–100.0)
Monocytes Absolute: 0.7 10*3/uL (ref 0.1–1.0)
Monocytes Relative: 10 %
Neutro Abs: 4.2 10*3/uL (ref 1.7–7.7)
Neutrophils Relative %: 60 %
Platelet Count: 198 10*3/uL (ref 150–400)
RBC: 3.89 MIL/uL (ref 3.87–5.11)
RDW: 16 % — ABNORMAL HIGH (ref 11.5–15.5)
WBC Count: 7.2 10*3/uL (ref 4.0–10.5)
nRBC: 0 % (ref 0.0–0.2)

## 2022-07-02 LAB — LACTATE DEHYDROGENASE: LDH: 204 U/L — ABNORMAL HIGH (ref 98–192)

## 2022-07-03 LAB — HAPTOGLOBIN: Haptoglobin: 42 mg/dL (ref 42–296)

## 2022-07-11 ENCOUNTER — Inpatient Hospital Stay (HOSPITAL_BASED_OUTPATIENT_CLINIC_OR_DEPARTMENT_OTHER): Payer: Medicaid Other | Admitting: Internal Medicine

## 2022-07-11 ENCOUNTER — Inpatient Hospital Stay: Payer: Medicaid Other

## 2022-07-11 ENCOUNTER — Encounter: Payer: Self-pay | Admitting: Internal Medicine

## 2022-07-11 VITALS — BP 123/78 | HR 86 | Temp 97.8°F | Ht 63.0 in | Wt 232.2 lb

## 2022-07-11 DIAGNOSIS — D649 Anemia, unspecified: Secondary | ICD-10-CM | POA: Diagnosis not present

## 2022-07-11 DIAGNOSIS — D509 Iron deficiency anemia, unspecified: Secondary | ICD-10-CM | POA: Diagnosis not present

## 2022-07-11 LAB — BASIC METABOLIC PANEL
Anion gap: 9 (ref 5–15)
BUN: 17 mg/dL (ref 6–20)
CO2: 28 mmol/L (ref 22–32)
Calcium: 9.3 mg/dL (ref 8.9–10.3)
Chloride: 101 mmol/L (ref 98–111)
Creatinine, Ser: 0.67 mg/dL (ref 0.44–1.00)
GFR, Estimated: >60 mL/min (ref >60)
Glucose, Bld: 99 mg/dL (ref 70–99)
Potassium: 3.9 mmol/L (ref 3.5–5.1)
Sodium: 138 mmol/L (ref 135–145)

## 2022-07-11 LAB — CBC WITH DIFFERENTIAL (CANCER CENTER ONLY)
Abs Immature Granulocytes: 0.03 10*3/uL (ref 0.00–0.07)
Basophils Absolute: 0 10*3/uL (ref 0.0–0.1)
Basophils Relative: 0 %
Eosinophils Absolute: 0 10*3/uL (ref 0.0–0.5)
Eosinophils Relative: 1 %
HCT: 38 % (ref 36.0–46.0)
Hemoglobin: 13.1 g/dL (ref 12.0–15.0)
Immature Granulocytes: 0 %
Lymphocytes Relative: 31 %
Lymphs Abs: 2.2 10*3/uL (ref 0.7–4.0)
MCH: 33.2 pg (ref 26.0–34.0)
MCHC: 34.5 g/dL (ref 30.0–36.0)
MCV: 96.4 fL (ref 80.0–100.0)
Monocytes Absolute: 0.7 10*3/uL (ref 0.1–1.0)
Monocytes Relative: 9 %
Neutro Abs: 4.1 10*3/uL (ref 1.7–7.7)
Neutrophils Relative %: 59 %
Platelet Count: 214 10*3/uL (ref 150–400)
RBC: 3.94 MIL/uL (ref 3.87–5.11)
RDW: 14.4 % (ref 11.5–15.5)
WBC Count: 7 10*3/uL (ref 4.0–10.5)
nRBC: 0 % (ref 0.0–0.2)

## 2022-07-11 LAB — LACTATE DEHYDROGENASE: LDH: 191 U/L (ref 98–192)

## 2022-07-11 MED ORDER — PREDNISONE 5 MG PO TABS: ORAL_TABLET | ORAL | 0 refills | Status: DC

## 2022-07-11 NOTE — Progress Notes (Signed)
[facility] CONSULT NOTE  Patient Care Team: Armando Gang, FNP as PCP - General (Family Medicine)  CHIEF COMPLAINTS/PURPOSE OF CONSULTATION: ANEMIA  HEMATOLOGY HISTORY  # IRON DEFICIENCY ANEMIA CHRONIC [since 2019] AUG 2021- hb-9; MCV- 63; Iron sat- 3%; EGD > 7 years ago [[place]]; colonoscopy/capsule-NA; PO iron constipates.   #History of heavy menstrual cycles  HISTORY OF PRESENTING ILLNESS: Accompanied by her mother.  Ambulating independently.  Danielle [name] 47 y.o.  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    Patient currently on prednisone 10 mg a aday.  C/o facial numbness around cheek area.   C/o blurred vision. Patient noted to have swelling of her face.  Continues to have abdominal discomfort/distention.   Denies difficulty swallowing.  Appetite is good.  Review of Systems  Constitutional:  Positive for malaise/fatigue. Negative for chills, diaphoresis, fever and weight loss.  HENT:  Negative for nosebleeds and sore throat.   Eyes:  Negative for double vision.  Respiratory:  Positive for cough, shortness of breath and wheezing. Negative for hemoptysis and sputum production.   Cardiovascular:  Negative for chest pain, palpitations, orthopnea and leg swelling.  Gastrointestinal:  Positive for nausea. Negative for abdominal pain, blood in stool, constipation, diarrhea, heartburn and melena.  Genitourinary:  Negative for dysuria, frequency and urgency.  Musculoskeletal:  Positive for myalgias. Negative for back pain and joint pain.  Skin: Negative.  Negative for itching and rash.  Neurological:  Positive for dizziness and tingling. Negative for focal weakness, weakness and headaches.  Endo/Heme/Allergies:  Does not bruise/bleed easily.  Psychiatric/Behavioral:  Negative for depression. The patient has insomnia. The patient is not nervous/anxious.     MEDICAL HISTORY:  Past Medical  History:  Diagnosis Date   Anemia    Depression     SURGICAL HISTORY: Past Surgical History:  Procedure Laterality Date   ABDOMINAL HYSTERECTOMY     COLONOSCOPY WITH PROPOFOL N/A 05/12/2022   Procedure: COLONOSCOPY WITH PROPOFOL;  Surgeon: Midge Minium, MD;  Location: [facility] ENDOSCOPY;  Service: Endoscopy;  Laterality: N/A;   ESOPHAGOGASTRODUODENOSCOPY (EGD) WITH PROPOFOL N/A 05/12/2022   Procedure: ESOPHAGOGASTRODUODENOSCOPY (EGD) WITH PROPOFOL;  Surgeon: Midge Minium, MD;  Location: [facility] ENDOSCOPY;  Service: Endoscopy;  Laterality: N/A;   IR BONE MARROW BIOPSY & ASPIRATION  05/28/2022   TONSILLECTOMY      SOCIAL HISTORY: Social History   Socioeconomic History   Marital status: Single    Spouse name: Not on file   Number of children: Not on file   Years of education: Not on file   Highest education level: Not on file  Occupational History   Not on file  Tobacco Use   Smoking status: Former    Packs/day: 0.50    Years: 25.00    Additional pack years: 0.00    Total pack years: 12.50    Types: Cigarettes    Quit date: 05/10/2022    Years since quitting: 0.1   Smokeless tobacco: Never  Vaping Use   Vaping Use: Never used  Substance and Sexual Activity   Alcohol use: Not Currently   Drug use: Never   Sexual activity: Not on file  Other Topics Concern   Not on file  Social History Narrative   Lives in [place] with 4 kids; stay at home [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    Social Determinants of Health   Financial Resource Strain: Not on file  Food Insecurity: No  Food Insecurity (05/11/2022)   Hunger Vital Sign    Worried About Running Out of Food in the Last Year: Never true    Ran Out of Food in the Last Year: Never true  Transportation Needs: Not on file  Physical Activity: Not on file  Stress: Not on file  Social Connections: Not on file  Intimate Partner Violence: Not on file    FAMILY HISTORY: Family History  Problem Relation Age of Onset    Breast cancer Paternal Aunt     ALLERGIES:  has No Known Allergies.  MEDICATIONS:  Current Outpatient Medications  Medication Sig Dispense Refill   albuterol (VENTOLIN HFA) 108 (90 Base) MCG/ACT inhaler Inhale 2 puffs into the lungs every 6 (six) hours as needed for wheezing or shortness of breath. 8 g 2   Dextromethorphan-Bupropion (AUVELITY PO) Take 1 tablet by mouth as directed. (Patient not taking: Reported on 06/25/2022)     metFORMIN (GLUCOPHAGE) 500 MG tablet Take 500 mg by mouth 2 (two) times daily with a meal. (Patient not taking: Reported on 06/10/2022)     predniSONE (DELTASONE) 5 MG tablet recommend prednisone 5 mg once a day for 1 week; and then 1 pill every other day for 1 week; and then stop. 10 tablet 0   traMADol (ULTRAM) 50 MG tablet Take 1 tablet (50 mg total) by mouth every 8 (eight) hours as needed. 30 tablet 0   No current facility-administered medications for this visit.      PHYSICAL EXAMINATION:   Vitals:   07/11/22 1045  BP: 123/78  Pulse: 86  Temp: 97.8 F (36.6 C)  SpO2: 100%   Filed Weights   07/11/22 1045  Weight: 232 lb 3.2 oz (105.3 kg)    Physical Exam HENT:     Head: Normocephalic and atraumatic.     Mouth/Throat:     Pharynx: No oropharyngeal exudate.  Eyes:     Pupils: Pupils are equal, round, and reactive to light.  Cardiovascular:     Rate and Rhythm: Normal rate and regular rhythm.  Pulmonary:     Effort: Pulmonary effort is normal. No respiratory distress.     Breath sounds: Normal breath sounds. No wheezing.  Abdominal:     General: Bowel sounds are normal. There is no distension.     Palpations: Abdomen is soft. There is no mass.     Tenderness: There is no abdominal tenderness. There is no guarding or rebound.  Musculoskeletal:        General: No tenderness. Normal range of motion.     Cervical back: Normal range of motion and neck supple.  Skin:    General: Skin is warm.  Neurological:     Mental Status: She is alert  and oriented to person, place, and time.  Psychiatric:        Mood and Affect: Affect normal.     LABORATORY DATA:  I have reviewed the data as listed Lab Results  Component Value Date   WBC 7.0 07/11/2022   HGB 13.1 07/11/2022   HCT 38.0 07/11/2022   MCV 96.4 07/11/2022   PLT 214 07/11/2022   Recent Labs    05/10/22 2030 05/11/22 0901 05/12/22 0458 05/16/22 1059 06/10/22 1013 06/17/22 0959 06/25/22 0950 07/11/22 1038  NA  --  138   < > 135 139 140 135 138  K  --  3.7   < > 4.2 3.6 3.8 4.0 3.9  CL  --  107   < > 101  103 104 101 101  CO2  --  25   < > 26 26 24 26 28   GLUCOSE  --  131*   < > 110* 132* 109* 99 99  BUN  --  9   < > 12 17 21* 19 17  CREATININE  --  0.53   < > 0.60 0.71 0.75 0.76 0.67  CALCIUM  --  8.7*   < > 9.2 9.2 8.8* 8.9 9.3  GFRNONAA  --  >60   < > >60 >60 >60 >60 >60  PROT 6.6 6.3*  --  7.3 6.7  --   --   --   ALBUMIN 3.6 3.3*  --  4.3 3.9  --   --   --   AST 32 36  --  55* 21  --   --   --   ALT 25 25  --  36 18  --   --   --   ALKPHOS 51 53  --  62 41  --   --   --   BILITOT 1.5* 1.9*  --  2.3* 0.9  --   --   --   BILIDIR 0.2 0.3*  --   --   --   --   --   --   IBILI 1.3* 1.6*  --   --   --   --   --   --    < > = values in this interval not displayed.     No results found.  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  JAK2 testing negative.  However bone marrow biopsy- [MAY 2024-negative for any myelophthisis process; suggestive of hemolysis] HOLD off metformin; and detromethomorphan- Bupropion. PNH testing-NEGATIVE; complement levels.;  Hemoglobin cascade; G6PD levels-pending.  April 2024 abdominal ultrasound showed mild splenomegaly.  # Currently on prednisone 10 mg/day-- Hb today 13.1  LDH -189- WNL.  Stable-recommend  prednisone 5 mg once a day for 1 week; and then 1 pill every other day for 1 week; and then stop.  New scipt sent.   # Discussed with the patient that if patient's anemia gets worse given her poor tolerance to steroids she might need alternative options like IVIG.  # Tingling/ numbness- ? Steroids- improving- not resolved; monitor for now; if not would consider imaging/ophthalmology evaluation.   #Smoker- quit April 2024-  # DISPOSITION: # follow up in 6  week-MD- labs- cbc/cm;- LDH; haptoglobin- Dr.B  # 25 minutes face-to-face with the patient discussing the above plan of care; more than 50% of time   All questions were answered. The patient knows to call the clinic with any problems, questions or concerns.    Earna Coder, MD 07/11/2022 12:49 PM

## 2022-07-11 NOTE — Progress Notes (Signed)
C/o facial numbness around cheek area.  C/o blurred vision.

## 2022-07-11 NOTE — Assessment & Plan Note (Signed)
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  JAK2 testing negative.  However bone marrow biopsy- [MAY 2024-negative for any myelophthisis process; suggestive of hemolysis] HOLD off metformin; and detromethomorphan- Bupropion. PNH testing-NEGATIVE; complement levels.;  Hemoglobin cascade; G6PD levels-pending.  April 2024 abdominal ultrasound showed mild splenomegaly.  # Currently on prednisone 10 mg/day-- Hb today 13.1  LDH -189- WNL.  Stable-recommend prednisone 5 mg once a day for 1 week; and then 1 pill every other day for 1 week; and then stop.  New scipt sent.   # Discussed with the patient that if patient's anemia gets worse given her poor tolerance to steroids she might need alternative options like IVIG.  # Tingling/ numbness- ? Steroids- improving- not resolved; monitor for now; if not would consider imaging/ophthalmology evaluation.   #Smoker- quit April 2024-  # DISPOSITION: # follow up in 6  week-MD- labs- cbc/cm;- LDH; haptoglobin- Dr.B  # 25 minutes face-to-face with the patient discussing the above plan of care; more than 50% of time

## 2022-08-22 ENCOUNTER — Inpatient Hospital Stay: Payer: Medicaid Other | Attending: Internal Medicine

## 2022-08-22 ENCOUNTER — Inpatient Hospital Stay: Payer: Medicaid Other | Admitting: Internal Medicine

## 2022-08-22 ENCOUNTER — Encounter: Payer: Self-pay | Admitting: Internal Medicine

## 2022-08-22 VITALS — BP 114/72 | HR 72 | Temp 97.6°F | Resp 20 | Wt 226.4 lb

## 2022-08-22 DIAGNOSIS — Z87891 Personal history of nicotine dependence: Secondary | ICD-10-CM | POA: Diagnosis not present

## 2022-08-22 DIAGNOSIS — Z79899 Other long term (current) drug therapy: Secondary | ICD-10-CM | POA: Diagnosis not present

## 2022-08-22 DIAGNOSIS — D649 Anemia, unspecified: Secondary | ICD-10-CM

## 2022-08-22 DIAGNOSIS — R200 Anesthesia of skin: Secondary | ICD-10-CM

## 2022-08-22 DIAGNOSIS — Z803 Family history of malignant neoplasm of breast: Secondary | ICD-10-CM | POA: Insufficient documentation

## 2022-08-22 LAB — CMP (CANCER CENTER ONLY)
ALT: 13 U/L (ref 0–44)
AST: 17 U/L (ref 15–41)
Albumin: 3.7 g/dL (ref 3.5–5.0)
Alkaline Phosphatase: 62 U/L (ref 38–126)
Anion gap: 9 (ref 5–15)
BUN: 14 mg/dL (ref 6–20)
CO2: 23 mmol/L (ref 22–32)
Calcium: 9.1 mg/dL (ref 8.9–10.3)
Chloride: 106 mmol/L (ref 98–111)
Creatinine: 0.63 mg/dL (ref 0.44–1.00)
GFR, Estimated: >60 mL/min (ref >60)
Glucose, Bld: 106 mg/dL — ABNORMAL HIGH (ref 70–99)
Potassium: 4.3 mmol/L (ref 3.5–5.1)
Sodium: 138 mmol/L (ref 135–145)
Total Bilirubin: 0.6 mg/dL (ref 0.3–1.2)
Total Protein: 7.1 g/dL (ref 6.5–8.1)

## 2022-08-22 LAB — CBC WITH DIFFERENTIAL (CANCER CENTER ONLY)
Abs Immature Granulocytes: 0.01 10*3/uL (ref 0.00–0.07)
Basophils Absolute: 0 10*3/uL (ref 0.0–0.1)
Basophils Relative: 0 %
Eosinophils Absolute: 0.1 10*3/uL (ref 0.0–0.5)
Eosinophils Relative: 2 %
HCT: 37.3 % (ref 36.0–46.0)
Hemoglobin: 13.3 g/dL (ref 12.0–15.0)
Immature Granulocytes: 0 %
Lymphocytes Relative: 32 %
Lymphs Abs: 1.6 10*3/uL (ref 0.7–4.0)
MCH: 32.4 pg (ref 26.0–34.0)
MCHC: 35.7 g/dL (ref 30.0–36.0)
MCV: 91 fL (ref 80.0–100.0)
Monocytes Absolute: 0.4 10*3/uL (ref 0.1–1.0)
Monocytes Relative: 8 %
Neutro Abs: 2.9 10*3/uL (ref 1.7–7.7)
Neutrophils Relative %: 58 %
Platelet Count: 208 10*3/uL (ref 150–400)
RBC: 4.1 MIL/uL (ref 3.87–5.11)
RDW: 14.1 % (ref 11.5–15.5)
WBC Count: 5 10*3/uL (ref 4.0–10.5)
nRBC: 0 % (ref 0.0–0.2)

## 2022-08-22 LAB — LACTATE DEHYDROGENASE: LDH: 145 U/L (ref 98–192)

## 2022-08-22 NOTE — Assessment & Plan Note (Signed)
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  JAK2 testing negative.  However bone marrow biopsy- [MAY 2024-negative for any myelophthisis process; suggestive of hemolysis] HOLD off metformin; and detromethomorphan- Bupropion. PNH testing-NEGATIVE; complement levels.;  Hemoglobin cascade; G6PD levels-pending.  April 2024 abdominal ultrasound showed mild splenomegaly.  # Currently OFF prednisone- Hb 13; NO active hem olysis is noted. Discussed with the patient that if patient's hemolytic anemia gets worse given her poor tolerance to steroids she might need alternative options like IVIG.  # Tingling/ numbness BILATERAL UE- carpal tunnel vs others- refer to neurology  #Smoker- quit April 2024-  # DISPOSITION:  # refer to neurology re: bil hand numbness/?  carpal tunnel- # 1 month- lab- cbc/LDH # 2 month- lab- cbc/LDH # 3 month- lab- cbc/LDH # follow up in 4 months -MD- labs- cbc/cmp;- LDH; haptoglobin- Dr.B

## 2022-08-22 NOTE — Progress Notes (Signed)
Patient states her hands have been going numb lately. Patient states it started about 2 weeks ago and keeps her up at night.

## 2022-08-22 NOTE — Progress Notes (Signed)
[facility] CONSULT NOTE  Patient Care Team: Armando Gang, FNP as PCP - General (Family Medicine)  CHIEF COMPLAINTS/PURPOSE OF CONSULTATION: ANEMIA  HEMATOLOGY HISTORY  # IRON DEFICIENCY ANEMIA CHRONIC [since 2019] AUG 2021- hb-9; MCV- 63; Iron sat- 3%; EGD > 7 years ago [[place]]; colonoscopy/capsule-NA; PO iron constipates.   #History of heavy menstrual cycles  HISTORY OF PRESENTING ILLNESS: Alone. Ambulating independently.  Danielle [name] 47 y.o.  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  Patient currently OFF  prednisone for last 4 weeks.   Patient states her hands have been going numb lately. Patient states it started about 2 weeks ago and keeps her up at night.   Review of Systems  Constitutional:  Positive for malaise/fatigue. Negative for chills, diaphoresis, fever and weight loss.  HENT:  Negative for nosebleeds and sore throat.   Eyes:  Negative for double vision.  Respiratory:  Positive for cough, shortness of breath and wheezing. Negative for hemoptysis and sputum production.   Cardiovascular:  Negative for chest pain, palpitations, orthopnea and leg swelling.  Gastrointestinal:  Positive for nausea. Negative for abdominal pain, blood in stool, constipation, diarrhea, heartburn and melena.  Genitourinary:  Negative for dysuria, frequency and urgency.  Musculoskeletal:  Positive for myalgias. Negative for back pain and joint pain.  Skin: Negative.  Negative for itching and rash.  Neurological:  Positive for dizziness and tingling. Negative for focal weakness, weakness and headaches.  Endo/Heme/Allergies:  Does not bruise/bleed easily.  Psychiatric/Behavioral:  Negative for depression. The patient has insomnia. The patient is not nervous/anxious.     MEDICAL HISTORY:  Past Medical History:  Diagnosis Date   Anemia    Depression     SURGICAL HISTORY: Past Surgical History:   Procedure Laterality Date   ABDOMINAL HYSTERECTOMY     COLONOSCOPY WITH PROPOFOL N/A 05/12/2022   Procedure: COLONOSCOPY WITH PROPOFOL;  Surgeon: Midge Minium, MD;  Location: [facility] ENDOSCOPY;  Service: Endoscopy;  Laterality: N/A;   ESOPHAGOGASTRODUODENOSCOPY (EGD) WITH PROPOFOL N/A 05/12/2022   Procedure: ESOPHAGOGASTRODUODENOSCOPY (EGD) WITH PROPOFOL;  Surgeon: Midge Minium, MD;  Location: [facility] ENDOSCOPY;  Service: Endoscopy;  Laterality: N/A;   IR BONE MARROW BIOPSY & ASPIRATION  05/28/2022   TONSILLECTOMY      SOCIAL HISTORY: Social History   Socioeconomic History   Marital status: Single    Spouse name: Not on file   Number of children: Not on file   Years of education: Not on file   Highest education level: Not on file  Occupational History   Not on file  Tobacco Use   Smoking status: Former    Current packs/day: 0.00    Average packs/day: 0.5 packs/day for 25.0 years (12.5 ttl pk-yrs)    Types: Cigarettes    Start date: 05/09/1997    Quit date: 05/10/2022    Years since quitting: 0.2   Smokeless tobacco: Never  Vaping Use   Vaping status: Never Used  Substance and Sexual Activity   Alcohol use: Not Currently   Drug use: Never   Sexual activity: Not on file  Other Topics Concern   Not on file  Social History Narrative   Lives in  with 4 kids; stay at home [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    Social Determinants of Health   Financial Resource Strain: Not on file  Food Insecurity: No Food Insecurity (05/11/2022)   Hunger Vital Sign    Worried About  Running Out of Food in the Last Year: Never true    Ran Out of Food in the Last Year: Never true  Transportation Needs: Not on file  Physical Activity: Not on file  Stress: Not on file  Social Connections: Not on file  Intimate Partner Violence: Not on file    FAMILY HISTORY: Family History  Problem Relation Age of Onset   Breast cancer Paternal Aunt     ALLERGIES:  has No Known  Allergies.  MEDICATIONS:  Current Outpatient Medications  Medication Sig Dispense Refill   albuterol (VENTOLIN HFA) 108 (90 Base) MCG/ACT inhaler Inhale 2 puffs into the lungs every 6 (six) hours as needed for wheezing or shortness of breath. 8 g 2   metFORMIN (GLUCOPHAGE) 500 MG tablet Take 500 mg by mouth 2 (two) times daily with a meal.     venlafaxine XR (EFFEXOR-XR) 37.5 MG 24 hr capsule Take 37.5 mg by mouth daily.     Dextromethorphan-Bupropion (AUVELITY PO) Take 1 tablet by mouth as directed. (Patient not taking: Reported on 06/25/2022)     predniSONE (DELTASONE) 5 MG tablet recommend prednisone 5 mg once a day for 1 week; and then 1 pill every other day for 1 week; and then stop. (Patient not taking: Reported on 08/22/2022) 10 tablet 0   traMADol (ULTRAM) 50 MG tablet Take 1 tablet (50 mg total) by mouth every 8 (eight) hours as needed. (Patient not taking: Reported on 08/22/2022) 30 tablet 0   No current facility-administered medications for this visit.      PHYSICAL EXAMINATION:   Vitals:   08/22/22 1024  BP: 114/72  Pulse: 72  Resp: 20  Temp: 97.6 F (36.4 C)  SpO2: 100%   Filed Weights   08/22/22 1024  Weight: 226 lb 6.4 oz (102.7 kg)    Physical Exam HENT:     Head: Normocephalic and atraumatic.     Mouth/Throat:     Pharynx: No oropharyngeal exudate.  Eyes:     Pupils: Pupils are equal, round, and reactive to light.  Cardiovascular:     Rate and Rhythm: Normal rate and regular rhythm.  Pulmonary:     Effort: Pulmonary effort is normal. No respiratory distress.     Breath sounds: Normal breath sounds. No wheezing.  Abdominal:     General: Bowel sounds are normal. There is no distension.     Palpations: Abdomen is soft. There is no mass.     Tenderness: There is no abdominal tenderness. There is no guarding or rebound.  Musculoskeletal:        General: No tenderness. Normal range of motion.     Cervical back: Normal range of motion and neck supple.   Skin:    General: Skin is warm.  Neurological:     Mental Status: She is alert and oriented to person, place, and time.  Psychiatric:        Mood and Affect: Affect normal.     LABORATORY DATA:  I have reviewed the data as listed Lab Results  Component Value Date   WBC 5.0 08/22/2022   HGB 13.3 08/22/2022   HCT 37.3 08/22/2022   MCV 91.0 08/22/2022   PLT 208 08/22/2022   Recent Labs    05/10/22 2030 05/11/22 0901 05/12/22 0458 05/16/22 1059 05/16/22 1059 06/10/22 1013 06/17/22 0959 06/25/22 0950 07/11/22 1038 08/22/22 1011  NA  --  138   < > 135  --  139   < > 135 138 138  K  --  3.7   < > 4.2  --  3.6   < > 4.0 3.9 4.3  CL  --  107   < > 101  --  103   < > 101 101 106  CO2  --  25   < > 26  --  26   < > 26 28 23   GLUCOSE  --  131*   < > 110*  --  132*   < > 99 99 106*  BUN  --  9   < > 12  --  17   < > 19 17 14   CREATININE  --  0.53   < > 0.60   < > 0.71   < > 0.76 0.67 0.63  CALCIUM  --  8.7*   < > 9.2  --  9.2   < > 8.9 9.3 9.1  GFRNONAA  --  >60   < > >60   < > >60   < > >60 >60 >60  PROT 6.6 6.3*  --  7.3  --  6.7  --   --   --  7.1  ALBUMIN 3.6 3.3*  --  4.3  --  3.9  --   --   --  3.7  AST 32 36  --  55*  --  21  --   --   --  17  ALT 25 25  --  36  --  18  --   --   --  13  ALKPHOS 51 53  --  62  --  41  --   --   --  62  BILITOT 1.5* 1.9*  --  2.3*  --  0.9  --   --   --  0.6  BILIDIR 0.2 0.3*  --   --   --   --   --   --   --   --   IBILI 1.3* 1.6*  --   --   --   --   --   --   --   --    < > = values in this interval not displayed.     No results found.  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  JAK2 testing negative.  However bone marrow biopsy- [MAY 2024-negative for any myelophthisis process; suggestive of hemolysis] HOLD  off metformin; and detromethomorphan- Bupropion. PNH testing-NEGATIVE; complement levels.;  Hemoglobin cascade; G6PD levels-pending.  April 2024 abdominal ultrasound showed mild splenomegaly.  # Currently OFF prednisone- Hb 13; NO active hem olysis is noted. Discussed with the patient that if patient's hemolytic anemia gets worse given her poor tolerance to steroids she might need alternative options like IVIG.  # Tingling/ numbness BILATERAL UE- carpal tunnel vs others- refer to neurology  #Smoker- quit April 2024-  # DISPOSITION:  # refer to neurology re: bil hand numbness/?  carpal tunnel- # 1 month- lab- cbc/LDH # 2 month- lab- cbc/LDH # 3 month- lab- cbc/LDH # follow up in 4 months -MD- labs- cbc/cmp;- LDH; haptoglobin- Dr.B   All questions were answered. The patient knows to call the clinic with any problems, questions or concerns.    Earna Coder, MD 08/22/2022 11:00 AM

## 2022-08-22 NOTE — Addendum Note (Signed)
Addended by: Darrold Span A on: 08/22/2022 11:40 AM   Modules accepted: Orders

## 2022-09-03 ENCOUNTER — Encounter: Payer: Self-pay | Admitting: Internal Medicine

## 2022-09-15 DIAGNOSIS — R202 Paresthesia of skin: Secondary | ICD-10-CM | POA: Insufficient documentation

## 2022-09-22 ENCOUNTER — Inpatient Hospital Stay: Payer: Medicaid Other | Attending: Internal Medicine

## 2022-10-14 DIAGNOSIS — G5601 Carpal tunnel syndrome, right upper limb: Secondary | ICD-10-CM | POA: Insufficient documentation

## 2022-10-22 ENCOUNTER — Other Ambulatory Visit: Payer: Self-pay | Admitting: *Deleted

## 2022-10-22 DIAGNOSIS — D539 Nutritional anemia, unspecified: Secondary | ICD-10-CM

## 2022-10-22 DIAGNOSIS — D61818 Other pancytopenia: Secondary | ICD-10-CM

## 2022-10-22 DIAGNOSIS — E611 Iron deficiency: Secondary | ICD-10-CM

## 2022-10-23 ENCOUNTER — Other Ambulatory Visit: Payer: Self-pay | Admitting: *Deleted

## 2022-10-23 ENCOUNTER — Inpatient Hospital Stay: Payer: Medicaid Other | Attending: Internal Medicine

## 2022-10-23 DIAGNOSIS — D539 Nutritional anemia, unspecified: Secondary | ICD-10-CM

## 2022-10-23 DIAGNOSIS — D472 Monoclonal gammopathy: Secondary | ICD-10-CM | POA: Insufficient documentation

## 2022-10-23 DIAGNOSIS — D591 Autoimmune hemolytic anemia, unspecified: Secondary | ICD-10-CM | POA: Insufficient documentation

## 2022-10-23 DIAGNOSIS — D649 Anemia, unspecified: Secondary | ICD-10-CM

## 2022-10-23 LAB — CBC WITH DIFFERENTIAL (CANCER CENTER ONLY)
Abs Immature Granulocytes: 0.01 10*3/uL (ref 0.00–0.07)
Basophils Absolute: 0 10*3/uL (ref 0.0–0.1)
Basophils Relative: 0 %
Eosinophils Absolute: 0.1 10*3/uL (ref 0.0–0.5)
Eosinophils Relative: 2 %
HCT: 32 % — ABNORMAL LOW (ref 36.0–46.0)
Hemoglobin: 11.5 g/dL — ABNORMAL LOW (ref 12.0–15.0)
Immature Granulocytes: 0 %
Lymphocytes Relative: 41 %
Lymphs Abs: 2 10*3/uL (ref 0.7–4.0)
MCH: 35.9 pg — ABNORMAL HIGH (ref 26.0–34.0)
MCHC: 35.9 g/dL (ref 30.0–36.0)
MCV: 100 fL (ref 80.0–100.0)
Monocytes Absolute: 0.2 10*3/uL (ref 0.1–1.0)
Monocytes Relative: 4 %
Neutro Abs: 2.5 10*3/uL (ref 1.7–7.7)
Neutrophils Relative %: 53 %
Platelet Count: 177 10*3/uL (ref 150–400)
RBC: 3.2 MIL/uL — ABNORMAL LOW (ref 3.87–5.11)
RDW: 20.9 % — ABNORMAL HIGH (ref 11.5–15.5)
WBC Count: 4.8 10*3/uL (ref 4.0–10.5)
nRBC: 0 % (ref 0.0–0.2)

## 2022-10-23 LAB — LACTATE DEHYDROGENASE: LDH: 227 U/L — ABNORMAL HIGH (ref 98–192)

## 2022-10-24 ENCOUNTER — Encounter: Payer: Self-pay | Admitting: Internal Medicine

## 2022-10-24 ENCOUNTER — Inpatient Hospital Stay (HOSPITAL_BASED_OUTPATIENT_CLINIC_OR_DEPARTMENT_OTHER): Payer: Medicaid Other | Admitting: Internal Medicine

## 2022-10-24 DIAGNOSIS — D649 Anemia, unspecified: Secondary | ICD-10-CM

## 2022-10-24 DIAGNOSIS — D61818 Other pancytopenia: Secondary | ICD-10-CM

## 2022-10-24 NOTE — Addendum Note (Signed)
Addended by: Darrold Span A on: 10/24/2022 11:39 AM   Modules accepted: Orders

## 2022-10-24 NOTE — Assessment & Plan Note (Addendum)
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  JAK2 testing negative.  However bone marrow biopsy- [MAY 2024-negative for any myelophthisis process; suggestive of hemolysis] HOLD off metformin; and detromethomorphan- Bupropion. PNH testing-NEGATIVE; complement levels.;  Hemoglobin cascade; G6PD levels-WNL.  April 2024 abdominal ultrasound showed mild splenomegaly.  # Patient is currently OFF prednisone-for the last 2 months also.  However, hemoglobin 11.3 LDH slightly elevated at 245.  I am concerned about the recurrent hemolytic anemia.I reviewed my concerns with the patient and discussed with the patient that if patient's hemolytic anemia gets worse given her poor tolerance to steroids she might need alternative options.  Alternative options include rituximab/IVIG.   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   # Tingling/ numbness BILATERAL UE- carpal tunnel vs others-status post evaluation with [facility] surgery planned in October/November.  However this might have to wait until patient is stable from hematologic standpoint.  #Smoker- quit April 2024-  # DISPOSITION: #cancel oct/dec appts.  # follow up in 1 week- MD; labs-CBC CMP LDH haptoglobin; hepatitis panel-; reticulocyte count- DAT - Dr.B

## 2022-10-24 NOTE — Progress Notes (Signed)
Danielle [name] connected with Danielle Danielle [name] on 10/24/22 at  8:45 AM EDT by video enabled telemedicine visit and verified that Danielle [name] am speaking with the correct person using two identifiers.  Danielle [name] discussed the limitations, risks, security and privacy concerns of performing an evaluation and management service by telemedicine and the availability of in-person appointments. Danielle [name] also discussed with the patient that there may be a patient responsible charge related to this service. The patient expressed understanding and agreed to proceed.    Other persons participating in the visit and their role in the encounter: RN/medical reconciliation Patient's location: home Provider's location: office  Oncology History   No history exists.   Chief Complaint: hemolytic anemia    History of present illness:Danielle Danielle [name] [age] y.o.  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  Patient complains of fatigue.  Intermittent dizziness.  Denies any blood in stools or black or stools.  Patient currently awaiting carpal tunnel surgery bilateral upper extremity October 8 and November 11.  Observation/objective: Alert & oriented x 3. In No acute distress.   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 # Based on the peripheral smear concerning for myelophthisis process [nucleated RBC schistocytes and teardrop]-no clinical concerns of any TTP or HUS/Danielle [name] syndrome.  JAK2 testing negative.  However bone marrow biopsy- [MAY 2024-negative for any myelophthisis process; suggestive of hemolysis] HOLD off metformin; and detromethomorphan- Bupropion. PNH testing-NEGATIVE; complement levels.;  Hemoglobin cascade; G6PD levels-WNL.  April 2024 abdominal ultrasound showed mild splenomegaly.  # Patient is currently OFF prednisone-for the last 2 months also.  However, hemoglobin 11.3 LDH slightly elevated at 245.   Danielle [name] am concerned about the recurrent hemolytic anemia.Danielle [name] reviewed my concerns with the patient and discussed with the patient that if patient's hemolytic anemia gets worse given her poor tolerance to steroids she might need alternative options.  Alternative options include rituximab/IVIG.   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   # Tingling/ numbness BILATERAL UE- carpal tunnel vs others-status post evaluation with [facility] surgery planned in October/November.  However this might have to wait until patient is stable from hematologic standpoint.  #Smoker- quit April 2024-  # DISPOSITION: #cancel oct/dec appts.  # follow up in 1 week- MD; labs-CBC CMP LDH haptoglobin; hepatitis panel-; reticulocyte count- DAT - Dr.B    Follow-up instructions:  Danielle [name] discussed the assessment and treatment plan with the patient.  The patient was provided an opportunity to ask questions and all were answered.  The patient agreed with the plan and demonstrated understanding of instructions.  The patient was advised to call back or seek an in person evaluation if the symptoms worsen or if the condition fails to improve as anticipated.    Dr. Louretta Shorten [facility] at [facility] 10/24/2022 9:32 AM

## 2022-10-24 NOTE — Progress Notes (Signed)
START OFF PATHWAY REGIMEN - Other   [id number]:Rituximab 375 mg/m2 IV D1 q7 Days:   A cycle is every 7 days:     Rituximab-xxxx   **Always confirm dose/schedule in your pharmacy ordering system**  Patient Characteristics: Intent of Therapy: Non-Curative / Palliative Intent, Discussed with Patient

## 2022-10-24 NOTE — Addendum Note (Signed)
Addended by: Darrold Span A on: 10/24/2022 12:21 PM   Modules accepted: Orders

## 2022-10-31 ENCOUNTER — Inpatient Hospital Stay: Payer: Medicaid Other | Attending: Internal Medicine

## 2022-10-31 ENCOUNTER — Encounter: Payer: Self-pay | Admitting: Internal Medicine

## 2022-10-31 ENCOUNTER — Inpatient Hospital Stay: Payer: Medicaid Other

## 2022-10-31 ENCOUNTER — Inpatient Hospital Stay (HOSPITAL_BASED_OUTPATIENT_CLINIC_OR_DEPARTMENT_OTHER): Payer: Medicaid Other | Admitting: Internal Medicine

## 2022-10-31 VITALS — BP 95/75 | HR 102 | Temp 97.0°F | Resp 18 | Ht 63.0 in | Wt 226.8 lb

## 2022-10-31 DIAGNOSIS — D649 Anemia, unspecified: Secondary | ICD-10-CM

## 2022-10-31 DIAGNOSIS — D61818 Other pancytopenia: Secondary | ICD-10-CM

## 2022-10-31 DIAGNOSIS — D591 Autoimmune hemolytic anemia, unspecified: Secondary | ICD-10-CM | POA: Insufficient documentation

## 2022-10-31 DIAGNOSIS — D472 Monoclonal gammopathy: Secondary | ICD-10-CM | POA: Insufficient documentation

## 2022-10-31 DIAGNOSIS — Z7952 Long term (current) use of systemic steroids: Secondary | ICD-10-CM | POA: Insufficient documentation

## 2022-10-31 DIAGNOSIS — D539 Nutritional anemia, unspecified: Secondary | ICD-10-CM

## 2022-10-31 DIAGNOSIS — Z5112 Encounter for antineoplastic immunotherapy: Secondary | ICD-10-CM | POA: Diagnosis present

## 2022-10-31 DIAGNOSIS — R161 Splenomegaly, not elsewhere classified: Secondary | ICD-10-CM | POA: Diagnosis not present

## 2022-10-31 DIAGNOSIS — Z23 Encounter for immunization: Secondary | ICD-10-CM | POA: Diagnosis not present

## 2022-10-31 DIAGNOSIS — D509 Iron deficiency anemia, unspecified: Secondary | ICD-10-CM | POA: Insufficient documentation

## 2022-10-31 DIAGNOSIS — E611 Iron deficiency: Secondary | ICD-10-CM

## 2022-10-31 LAB — CMP (CANCER CENTER ONLY)
ALT: 16 U/L (ref 0–44)
AST: 18 U/L (ref 15–41)
Albumin: 4 g/dL (ref 3.5–5.0)
Alkaline Phosphatase: 68 U/L (ref 38–126)
Anion gap: 8 (ref 5–15)
BUN: 16 mg/dL (ref 6–20)
CO2: 25 mmol/L (ref 22–32)
Calcium: 9.1 mg/dL (ref 8.9–10.3)
Chloride: 105 mmol/L (ref 98–111)
Creatinine: 0.58 mg/dL (ref 0.44–1.00)
GFR, Estimated: >60 mL/min (ref >60)
Glucose, Bld: 139 mg/dL — ABNORMAL HIGH (ref 70–99)
Potassium: 3.7 mmol/L (ref 3.5–5.1)
Sodium: 138 mmol/L (ref 135–145)
Total Bilirubin: 1.2 mg/dL (ref 0.3–1.2)
Total Protein: 7.1 g/dL (ref 6.5–8.1)

## 2022-10-31 LAB — CBC WITH DIFFERENTIAL (CANCER CENTER ONLY)
Abs Immature Granulocytes: 0.02 10*3/uL (ref 0.00–0.07)
Basophils Absolute: 0 10*3/uL (ref 0.0–0.1)
Basophils Relative: 0 %
Eosinophils Absolute: 0.1 10*3/uL (ref 0.0–0.5)
Eosinophils Relative: 3 %
HCT: 31.7 % — ABNORMAL LOW (ref 36.0–46.0)
Hemoglobin: 11.1 g/dL — ABNORMAL LOW (ref 12.0–15.0)
Immature Granulocytes: 0 %
Lymphocytes Relative: 32 %
Lymphs Abs: 1.7 10*3/uL (ref 0.7–4.0)
MCH: 36.4 pg — ABNORMAL HIGH (ref 26.0–34.0)
MCHC: 35 g/dL (ref 30.0–36.0)
MCV: 103.9 fL — ABNORMAL HIGH (ref 80.0–100.0)
Monocytes Absolute: 0.1 10*3/uL (ref 0.1–1.0)
Monocytes Relative: 3 %
Neutro Abs: 3.3 10*3/uL (ref 1.7–7.7)
Neutrophils Relative %: 62 %
Platelet Count: 177 10*3/uL (ref 150–400)
RBC: 3.05 MIL/uL — ABNORMAL LOW (ref 3.87–5.11)
RDW: 21.8 % — ABNORMAL HIGH (ref 11.5–15.5)
WBC Count: 5.2 10*3/uL (ref 4.0–10.5)
nRBC: 0 % (ref 0.0–0.2)

## 2022-10-31 LAB — RETIC PANEL
Immature Retic Fract: 17.4 % — ABNORMAL HIGH (ref 2.3–15.9)
RBC.: 2.97 MIL/uL — ABNORMAL LOW (ref 3.87–5.11)
Retic Count, Absolute: 114.9 10*3/uL (ref 19.0–186.0)
Retic Ct Pct: 3.9 % — ABNORMAL HIGH (ref 0.4–3.1)
Reticulocyte Hemoglobin: 43 pg (ref >27.9)

## 2022-10-31 LAB — LACTATE DEHYDROGENASE: LDH: 244 U/L — ABNORMAL HIGH (ref 98–192)

## 2022-10-31 LAB — DIRECT ANTIGLOBULIN TEST (NOT AT ARMC)
DAT, IgG: NEGATIVE
DAT, complement: NEGATIVE

## 2022-10-31 LAB — HEPATITIS C ANTIBODY: HCV Ab: NONREACTIVE

## 2022-10-31 LAB — HEPATITIS B CORE ANTIBODY, IGM: Hep B C IgM: NONREACTIVE

## 2022-10-31 LAB — HEPATITIS B SURFACE ANTIGEN: Hepatitis B Surface Ag: NONREACTIVE

## 2022-10-31 MED ORDER — PREDNISONE 20 MG PO TABS: ORAL_TABLET | ORAL | 0 refills | Status: DC

## 2022-10-31 MED ORDER — INFLUENZA VIRUS VACC SPLIT PF (FLUZONE) 0.5 ML IM SUSY
0.5000 mL | PREFILLED_SYRINGE | Freq: Once | INTRAMUSCULAR | Status: AC
  Administered 2022-10-31: 0.5 mL via INTRAMUSCULAR
  Filled 2022-10-31: qty 0.5

## 2022-10-31 NOTE — Progress Notes (Signed)
[facility] CONSULT NOTE  Patient Care Team: Armando Gang, FNP as PCP - General (Family Medicine) Earna Coder, MD as Consulting Physician (Oncology)  CHIEF COMPLAINTS/PURPOSE OF CONSULTATION: ANEMIA  HEMATOLOGY HISTORY  # IRON DEFICIENCY ANEMIA CHRONIC [since 2019] AUG 2021- hb-9; MCV- 63; Iron sat- 3%; EGD > 7 years ago [[place]]; colonoscopy/capsule-NA; PO iron constipates.   #History of heavy menstrual cycles  HISTORY OF PRESENTING ILLNESS: Alone. Ambulating independently.  Danielle [name] 47 y.o.  female with autoimmune hemolytic anemia steroid responsive -but DAT negative with unclear etiology-s/p  prednisone is here for follow-up.  Patiently currently on surveillance.  Patient prescribed approximately 2 to 3 months ago.  More recently patient feels weak, fatigued. Feels dizzy. Dyspnea with exertion. No blood in stools.   Pt is frustrated regarding the carpal tunnel in her hands. She is having difficulty caring for her special needs child because of her hands. She needs the surgery but it is on hold per hematology. Pain is 8/10 in bilateral hands.   Review of Systems  Constitutional:  Positive for malaise/fatigue. Negative for chills, diaphoresis, fever and weight loss.  HENT:  Negative for nosebleeds and sore throat.   Eyes:  Negative for double vision.  Respiratory:  Positive for cough, shortness of breath and wheezing. Negative for hemoptysis and sputum production.   Cardiovascular:  Negative for chest pain, palpitations, orthopnea and leg swelling.  Gastrointestinal:  Positive for nausea. Negative for abdominal pain, blood in stool, constipation, diarrhea, heartburn and melena.  Genitourinary:  Negative for dysuria, frequency and urgency.  Musculoskeletal:  Positive for myalgias. Negative for back pain and joint pain.  Skin: Negative.  Negative for itching and rash.  Neurological:  Positive for dizziness and tingling. Negative for focal weakness,  weakness and headaches.  Endo/Heme/Allergies:  Does not bruise/bleed easily.  Psychiatric/Behavioral:  Negative for depression. The patient has insomnia. The patient is not nervous/anxious.     MEDICAL HISTORY:  Past Medical History:  Diagnosis Date   Anemia    Depression     SURGICAL HISTORY: Past Surgical History:  Procedure Laterality Date   ABDOMINAL HYSTERECTOMY     COLONOSCOPY WITH PROPOFOL N/A 05/12/2022   Procedure: COLONOSCOPY WITH PROPOFOL;  Surgeon: Midge Minium, MD;  Location: [facility] ENDOSCOPY;  Service: Endoscopy;  Laterality: N/A;   ESOPHAGOGASTRODUODENOSCOPY (EGD) WITH PROPOFOL N/A 05/12/2022   Procedure: ESOPHAGOGASTRODUODENOSCOPY (EGD) WITH PROPOFOL;  Surgeon: Midge Minium, MD;  Location[facility] ENDOSCOPY;  Service: Endoscopy;  Laterality: N/A;   IR BONE MARROW BIOPSY & ASPIRATION  05/28/2022   TONSILLECTOMY      SOCIAL HISTORY: Social History   Socioeconomic History   Marital status: Single    Spouse name: Not on file   Number of children: Not on file   Years of education: Not on file   Highest education level: Not on file  Occupational History   Not on file  Tobacco Use   Smoking status: Former    Current packs/day: 0.00    Average packs/day: 0.5 packs/day for 25.0 years (12.5 ttl pk-yrs)    Types: Cigarettes    Start date: 05/09/1997    Quit date: 05/10/2022    Years since quitting: 0.4   Smokeless tobacco: Never  Vaping Use   Vaping status: Never Used  Substance and Sexual Activity   Alcohol use: Not Currently   Drug use: Never   Sexual activity: Not on file  Other Topics Concern   Not on file  Social History Narrative  Lives in [place] with 4 kids; stay at home [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    Social Determinants of Health   Financial Resource Strain: Not on file  Food Insecurity: No Food Insecurity (05/11/2022)   Hunger Vital Sign    Worried About Running Out of Food in the Last Year: Never true    Ran Out of Food in the  Last Year: Never true  Transportation Needs: Not on file  Physical Activity: Not on file  Stress: Not on file  Social Connections: Not on file  Intimate Partner Violence: Not on file    FAMILY HISTORY: Family History  Problem Relation Age of Onset   Breast cancer Paternal Aunt     ALLERGIES:  has No Known Allergies.  MEDICATIONS:  Current Outpatient Medications  Medication Sig Dispense Refill   metFORMIN (GLUCOPHAGE) 500 MG tablet Take 500 mg by mouth 2 (two) times daily with a meal.     predniSONE (DELTASONE) 20 MG tablet Once a day with food  in AM: Take 5 pills a day for the next 1 week; and then 4 pills a day for the next 1 week-do not stop until further directed. 100 tablet 0   Semaglutide-Weight Management (WEGOVY) 0.25 MG/0.5ML SOAJ Inject 0.25 mg into the skin once a week.     albuterol (VENTOLIN HFA) 108 (90 Base) MCG/ACT inhaler Inhale 2 puffs into the lungs every 6 (six) hours as needed for wheezing or shortness of breath. (Patient not taking: Reported on 10/31/2022) 8 g 2   Dextromethorphan-Bupropion (AUVELITY PO) Take 1 tablet by mouth as directed. (Patient not taking: Reported on 06/25/2022)     gabapentin (NEURONTIN) 100 MG capsule Take 1 capsule by mouth 3 (three) times daily.     predniSONE (DELTASONE) 5 MG tablet recommend prednisone 5 mg once a day for 1 week; and then 1 pill every other day for 1 week; and then stop. (Patient not taking: Reported on 08/22/2022) 10 tablet 0   traMADol (ULTRAM) 50 MG tablet Take 1 tablet (50 mg total) by mouth every 8 (eight) hours as needed. (Patient not taking: Reported on 08/22/2022) 30 tablet 0   venlafaxine XR (EFFEXOR-XR) 37.5 MG 24 hr capsule Take 37.5 mg by mouth daily. (Patient not taking: Reported on 10/31/2022)     No current facility-administered medications for this visit.   Facility-Administered Medications Ordered in Other Visits  Medication Dose Route Frequency Provider Last Rate Last Admin   influenza vac split trivalent  PF (FLULAVAL) injection 0.5 mL  0.5 mL Intramuscular Once Earna Coder, MD          PHYSICAL EXAMINATION:   Vitals:   10/31/22 0919  BP: 95/75  Pulse: (!) 102  Resp: 18  Temp: (!) 97 F (36.1 C)  SpO2: 100%   Filed Weights   10/31/22 0919  Weight: 226 lb 12.8 oz (102.9 kg)    Physical Exam HENT:     Head: Normocephalic and atraumatic.     Mouth/Throat:     Pharynx: No oropharyngeal exudate.  Eyes:     Pupils: Pupils are equal, round, and reactive to light.  Cardiovascular:     Rate and Rhythm: Normal rate and regular rhythm.  Pulmonary:     Effort: Pulmonary effort is normal. No respiratory distress.     Breath sounds: Normal breath sounds. No wheezing.  Abdominal:     General: Bowel sounds are normal. There is no distension.     Palpations: Abdomen is soft.  There is no mass.     Tenderness: There is no abdominal tenderness. There is no guarding or rebound.  Musculoskeletal:        General: No tenderness. Normal range of motion.     Cervical back: Normal range of motion and neck supple.  Skin:    General: Skin is warm.  Neurological:     Mental Status: She is alert and oriented to person, place, and time.  Psychiatric:        Mood and Affect: Affect normal.     LABORATORY DATA:  I have reviewed the data as listed Lab Results  Component Value Date   WBC 5.2 10/31/2022   HGB 11.1 (L) 10/31/2022   HCT 31.7 (L) 10/31/2022   MCV 103.9 (H) 10/31/2022   PLT 177 10/31/2022   Recent Labs    05/10/22 2030 05/11/22 0901 05/12/22 0458 06/10/22 1013 06/17/22 0959 07/11/22 1038 08/22/22 1011 10/31/22 0900  NA  --  138   < > 139   < > 138 138 138  K  --  3.7   < > 3.6   < > 3.9 4.3 3.7  CL  --  107   < > 103   < > 101 106 105  CO2  --  25   < > 26   < > 28 23 25   GLUCOSE  --  131*   < > 132*   < > 99 106* 139*  BUN  --  9   < > 17   < > 17 14 16   CREATININE  --  0.53   < > 0.71   < > 0.67 0.63 0.58  CALCIUM  --  8.7*   < > 9.2   < > 9.3 9.1 9.1   GFRNONAA  --  >60   < > >60   < > >60 >60 >60  PROT 6.6 6.3*   < > 6.7  --   --  7.1 7.1  ALBUMIN 3.6 3.3*   < > 3.9  --   --  3.7 4.0  AST 32 36   < > 21  --   --  17 18  ALT 25 25   < > 18  --   --  13 16  ALKPHOS 51 53   < > 41  --   --  62 68  BILITOT 1.5* 1.9*   < > 0.9  --   --  0.6 1.2  BILIDIR 0.2 0.3*  --   --   --   --   --   --   IBILI 1.3* 1.6*  --   --   --   --   --   --    < > = values in this interval not displayed.     No results found.  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  JAK2 testing negative.  However bone marrow biopsy- [MAY 2024-negative for any myelophthisis process; suggestive of hemolysis] HOLD off metformin; and detromethomorphan- Bupropion. PNH testing-NEGATIVE; complement levels.;  Hemoglobin cascade; G6PD levels-WNL.  April 2024 abdominal ultrasound showed mild splenomegaly.  # Patient is currently OFF prednisone-for the last 2 months also.  However, hemoglobin 11.3 LDH slightly elevated at rising.  I discussed with the patient that I am concerned about the recurrent  hemolytic anemia.   # I recommend proceeding with rituximab steroids-to treat autoimmune hemolytic anemia. Recommend prednisone 100 mg/day x1 week; and then 80 mg/day-and taper as needed. #  Discussed mechanism of action of rituximab;and also potential side effects including but not limited to infusion reactions;rare infections/activation including hepatitis.  Check hepatitis work-up at baseline.   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   # Tingling/ numbness BILATERAL UE- carpal tunnel vs others-status post evaluation with [facility] surgery planned in October/November.  However this might have to wait until patient is stable  from hematologic standpoint.  #Smoker- quit April 2024-  # Vaccinations: Flu host today.   # DISPOSITION: # Flu shot today # follow up in 1 week-MD; rituximab- cbc/;LDH # in follow up in 2 weeks- MD; labs- cbc;bmp;  LDH; rituximab-Dr.B  # 40 minutes face-to-face with the patient discussing the above plan of care; more than 50% of time spent on prognosis/ natural history; counseling and coordination.    All questions were answered. The patient knows to call the clinic with any problems, questions or concerns.    Earna Coder, MD 10/31/2022 10:31 AM

## 2022-10-31 NOTE — Progress Notes (Signed)
Pt feels weak, fatigued. Feels dizzy. Dyspnea with exertion. No blood in stools. Pt is frustrated regarding the carpal tunnel in her hands. She is having difficulty caring for her special needs child because of her hands. She needs the surgery but it is on hold per hematology. Pain is 8/10 in bilateral hands.

## 2022-10-31 NOTE — Assessment & Plan Note (Addendum)
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  JAK2 testing negative.  However bone marrow biopsy- [MAY 2024-negative for any myelophthisis process; suggestive of hemolysis] HOLD off metformin; and detromethomorphan- Bupropion. PNH testing-NEGATIVE; complement levels.;  Hemoglobin cascade; G6PD levels-WNL.  April 2024 abdominal ultrasound showed mild splenomegaly.  # Patient is currently OFF prednisone-for the last 2 months also.  However, hemoglobin 11.3 LDH slightly elevated at rising.  I discussed with the patient that I am concerned about the recurrent hemolytic anemia.   # I recommend proceeding with rituximab steroids-to treat autoimmune hemolytic anemia. Recommend prednisone 100 mg/day x1 week; and then 80 mg/day-and taper as needed. #  Discussed mechanism of action of rituximab;and also potential side effects including but not limited to infusion reactions;rare infections/activation including hepatitis.  Check hepatitis work-up at baseline.   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   # Tingling/ numbness BILATERAL UE- carpal tunnel vs others-status post evaluation with [facility] surgery planned in October/November.  However this might have to wait until patient is stable from hematologic standpoint.  #Smoker- quit April 2024-  # Vaccinations: Flu host today.   # DISPOSITION: # Flu shot today # follow up in 1 week-MD; rituximab- cbc/;LDH # in follow up in 2 weeks- MD; labs- cbc;bmp;  LDH; rituximab-Dr.B  # 40 minutes face-to-face with the patient discussing the above plan of care; more than 50% of time spent on prognosis/ natural history; counseling and coordination.

## 2022-11-01 LAB — HAPTOGLOBIN: Haptoglobin: <10 mg/dL — ABNORMAL LOW (ref 42–296)

## 2022-11-03 ENCOUNTER — Encounter: Payer: Self-pay | Admitting: Internal Medicine

## 2022-11-05 NOTE — Progress Notes (Signed)
Pharmacist Chemotherapy Monitoring - Initial Assessment    Anticipated start date: 11/07/22   The following has been reviewed per standard work regarding the patient's treatment regimen: The patient's diagnosis, treatment plan and drug doses, and organ/hematologic function Lab orders and baseline tests specific to treatment regimen  The treatment plan start date, drug sequencing, and pre-medications Prior authorization status  Patient's documented medication list, including drug-drug interaction screen and prescriptions for anti-emetics and supportive care specific to the treatment regimen The drug concentrations, fluid compatibility, administration routes, and timing of the medications to be used The patient's access for treatment and lifetime cumulative dose history, if applicable  The patient's medication allergies and previous infusion related reactions, if applicable   Changes made to treatment plan:  N/A  Follow up needed:  Will f/u tol of [facility] & switch to rapid if appropriate.   Ebony Hail, Pharm.D., CPP 11/05/2022@7 :44 PM

## 2022-11-07 ENCOUNTER — Emergency Department: Payer: Medicaid Other

## 2022-11-07 ENCOUNTER — Inpatient Hospital Stay: Payer: Medicaid Other | Admitting: Internal Medicine

## 2022-11-07 ENCOUNTER — Emergency Department
Admission: EM | Admit: 2022-11-07 | Discharge: 2022-11-07 | Disposition: A | Discharge status: Home / Self Care | Payer: Medicaid Other | Attending: Emergency Medicine | Admitting: Emergency Medicine

## 2022-11-07 ENCOUNTER — Other Ambulatory Visit: Payer: Self-pay

## 2022-11-07 ENCOUNTER — Inpatient Hospital Stay: Payer: Medicaid Other

## 2022-11-07 VITALS — BP 95/65 | HR 90 | Temp 97.8°F | Wt 225.0 lb

## 2022-11-07 VITALS — BP 104/79 | HR 111 | Temp 98.5°F | Resp 22

## 2022-11-07 DIAGNOSIS — D649 Anemia, unspecified: Secondary | ICD-10-CM

## 2022-11-07 DIAGNOSIS — D61818 Other pancytopenia: Secondary | ICD-10-CM

## 2022-11-07 DIAGNOSIS — R42 Dizziness and giddiness: Secondary | ICD-10-CM | POA: Insufficient documentation

## 2022-11-07 DIAGNOSIS — D472 Monoclonal gammopathy: Secondary | ICD-10-CM | POA: Diagnosis not present

## 2022-11-07 DIAGNOSIS — T7840XA Allergy, unspecified, initial encounter: Secondary | ICD-10-CM | POA: Insufficient documentation

## 2022-11-07 LAB — CBC WITH DIFFERENTIAL (CANCER CENTER ONLY)
Abs Immature Granulocytes: 0.01 10*3/uL (ref 0.00–0.07)
Basophils Absolute: 0 10*3/uL (ref 0.0–0.1)
Basophils Relative: 0 %
Eosinophils Absolute: 0.1 10*3/uL (ref 0.0–0.5)
Eosinophils Relative: 2 %
HCT: 25.9 % — ABNORMAL LOW (ref 36.0–46.0)
Hemoglobin: 9.2 g/dL — ABNORMAL LOW (ref 12.0–15.0)
Immature Granulocytes: 0 %
Lymphocytes Relative: 34 %
Lymphs Abs: 1.4 10*3/uL (ref 0.7–4.0)
MCH: 37.6 pg — ABNORMAL HIGH (ref 26.0–34.0)
MCHC: 35.5 g/dL (ref 30.0–36.0)
MCV: 105.7 fL — ABNORMAL HIGH (ref 80.0–100.0)
Monocytes Absolute: 0.2 10*3/uL (ref 0.1–1.0)
Monocytes Relative: 4 %
Neutro Abs: 2.5 10*3/uL (ref 1.7–7.7)
Neutrophils Relative %: 60 %
Platelet Count: 167 10*3/uL (ref 150–400)
RBC: 2.45 MIL/uL — ABNORMAL LOW (ref 3.87–5.11)
RDW: 21.8 % — ABNORMAL HIGH (ref 11.5–15.5)
WBC Count: 4.1 10*3/uL (ref 4.0–10.5)
nRBC: 0.5 % — ABNORMAL HIGH (ref 0.0–0.2)

## 2022-11-07 LAB — BASIC METABOLIC PANEL
Anion gap: 13 (ref 5–15)
BUN: 13 mg/dL (ref 6–20)
CO2: 22 mmol/L (ref 22–32)
Calcium: 8.8 mg/dL — ABNORMAL LOW (ref 8.9–10.3)
Chloride: 102 mmol/L (ref 98–111)
Creatinine, Ser: 0.62 mg/dL (ref 0.44–1.00)
GFR, Estimated: >60 mL/min (ref >60)
Glucose, Bld: 248 mg/dL — ABNORMAL HIGH (ref 70–99)
Potassium: 3.6 mmol/L (ref 3.5–5.1)
Sodium: 137 mmol/L (ref 135–145)

## 2022-11-07 LAB — CBC
HCT: 27.3 % — ABNORMAL LOW (ref 36.0–46.0)
Hemoglobin: 10 g/dL — ABNORMAL LOW (ref 12.0–15.0)
MCH: 38.3 pg — ABNORMAL HIGH (ref 26.0–34.0)
MCHC: 36.6 g/dL — ABNORMAL HIGH (ref 30.0–36.0)
MCV: 104.6 fL — ABNORMAL HIGH (ref 80.0–100.0)
Platelets: 184 10*3/uL (ref 150–400)
RBC: 2.61 MIL/uL — ABNORMAL LOW (ref 3.87–5.11)
RDW: 22 % — ABNORMAL HIGH (ref 11.5–15.5)
WBC: 5.5 10*3/uL (ref 4.0–10.5)
nRBC: 0.9 % — ABNORMAL HIGH (ref 0.0–0.2)

## 2022-11-07 LAB — LACTATE DEHYDROGENASE: LDH: 425 U/L — ABNORMAL HIGH (ref 98–192)

## 2022-11-07 MED ORDER — SODIUM CHLORIDE 0.9 % IV SOLN
20.0000 mg | Freq: Once | INTRAVENOUS | Status: AC
  Administered 2022-11-07: 20 mg via INTRAVENOUS
  Filled 2022-11-07: qty 2

## 2022-11-07 MED ORDER — ACETAMINOPHEN 325 MG PO TABS
650.0000 mg | ORAL_TABLET | Freq: Once | ORAL | Status: AC
  Administered 2022-11-07: 650 mg via ORAL
  Filled 2022-11-07: qty 2

## 2022-11-07 MED ORDER — MECLIZINE HCL 25 MG PO TABS: 25.0000 mg | ORAL_TABLET | Freq: Three times a day (TID) | ORAL | 0 refills | Status: DC | PRN

## 2022-11-07 MED ORDER — MECLIZINE HCL 25 MG PO TABS
25.0000 mg | ORAL_TABLET | Freq: Once | ORAL | Status: AC
  Administered 2022-11-07: 25 mg via ORAL
  Filled 2022-11-07: qty 1

## 2022-11-07 MED ORDER — SODIUM CHLORIDE 0.9 % IV SOLN
375.0000 mg/m2 | Freq: Once | INTRAVENOUS | Status: AC
  Administered 2022-11-07: 800 mg via INTRAVENOUS
  Filled 2022-11-07: qty 50

## 2022-11-07 MED ORDER — ACETAMINOPHEN 500 MG PO TABS
1000.0000 mg | ORAL_TABLET | Freq: Once | ORAL | Status: AC
  Administered 2022-11-07: 1000 mg via ORAL
  Filled 2022-11-07: qty 2

## 2022-11-07 MED ORDER — HYDROXYZINE HCL 25 MG PO TABS: 25.0000 mg | ORAL_TABLET | Freq: Three times a day (TID) | ORAL | 0 refills | Status: DC | PRN

## 2022-11-07 MED ORDER — SODIUM CHLORIDE 0.9 % IV SOLN
Freq: Once | INTRAVENOUS | Status: DC | PRN
  Filled 2022-11-07: qty 250

## 2022-11-07 MED ORDER — IOHEXOL 350 MG/ML SOLN
75.0000 mL | Freq: Once | INTRAVENOUS | Status: AC | PRN
  Administered 2022-11-07: 75 mL via INTRAVENOUS

## 2022-11-07 MED ORDER — FOLIC ACID 1 MG PO TABS: 1.0000 mg | ORAL_TABLET | Freq: Every day | ORAL | 1 refills | Status: DC

## 2022-11-07 MED ORDER — SODIUM CHLORIDE 0.9 % IV SOLN
Freq: Once | INTRAVENOUS | Status: AC
  Filled 2022-11-07: qty 250

## 2022-11-07 MED ORDER — METHYLPREDNISOLONE SODIUM SUCC 125 MG IJ SOLR
125.0000 mg | Freq: Once | INTRAMUSCULAR | Status: AC | PRN
  Administered 2022-11-07: 125 mg via INTRAVENOUS

## 2022-11-07 MED ORDER — DIPHENHYDRAMINE HCL 50 MG/ML IJ SOLN
50.0000 mg | Freq: Once | INTRAMUSCULAR | Status: AC | PRN
  Administered 2022-11-07: 50 mg via INTRAVENOUS

## 2022-11-07 MED ORDER — EPINEPHRINE 0.3 MG/0.3ML IJ SOAJ
0.3000 mg | Freq: Once | INTRAMUSCULAR | Status: DC | PRN
  Administered 2022-11-07: 0.3 mg via INTRAMUSCULAR

## 2022-11-07 MED ORDER — FAMOTIDINE IN NACL 20-0.9 MG/50ML-% IV SOLN
20.0000 mg | Freq: Once | INTRAVENOUS | Status: AC | PRN
  Administered 2022-11-07: 20 mg via INTRAVENOUS

## 2022-11-07 MED ORDER — DIPHENHYDRAMINE HCL 25 MG PO CAPS
50.0000 mg | ORAL_CAPSULE | Freq: Once | ORAL | Status: AC
  Administered 2022-11-07: 50 mg via ORAL
  Filled 2022-11-07: qty 2

## 2022-11-07 NOTE — Progress Notes (Signed)
Hypersensitivity Reaction note  Date of event: 11/07/22 Time of event: 1138 Generic name of drug involved: Rituximab Name of provider notified of the hypersensitivity reaction: Josh Borders, NP Was agent that likely caused hypersensitivity reaction added to Allergies List within EMR? Yes  Chain of events including reaction signs/symptoms, treatment administered, and outcome (e.g., drug resumed; drug discontinued; sent to Emergency Department; etc.)  1st time Rituximab ordered per MD. Premeds given per protocol.   1034 Rituxan started at initial rate of 17 ml/hr per protocol.  1108 VSS, Rituximab titrated up to second rate of 34 ml/hr.  1138, RN noticed patient seemed to be anxious, fidgeting, stopped Rituximab. VSS 1140, patient complained of scratchy throat and feeling dizzy, NP called. 1142, Elouise Munroe, NP at chairside.     Pepcid and Solumedrol given per NP. 1145 Patient stated that she felt like there was "water in her nose". Patient started coughing. 1146 Dr. Donneta Romberg at chairside. 1147 Patient having shortness of breath. EMS called. 1148 Suction started and O2 via nasal cannula applied.  1149 EPI given to left thigh per Dr. Donneta Romberg. O2 via face mask applied. 1150 BP 140/111, P 118, O2 100 %. 1155 EMS arrived to [facility]. 1215 Patient transported to ED via EMS in stable condition.  Adora Fridge, RN 11/07/2022 2:35 PM

## 2022-11-07 NOTE — Progress Notes (Signed)
[facility] CONSULT NOTE  Patient Care Team: Armando Gang, FNP as PCP - General (Family Medicine) Earna Coder, MD as Consulting Physician (Oncology)  CHIEF COMPLAINTS/PURPOSE OF CONSULTATION: ANEMIA  HEMATOLOGY HISTORY  # IRON DEFICIENCY ANEMIA CHRONIC [since 2019] AUG 2021- hb-9; MCV- 63; Iron sat- 3%; EGD > 7 years ago [[place]]; colonoscopy/capsule-NA; PO iron constipates.   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  JAK2 testing negative.  However bone marrow biopsy- [MAY 2024-negative for any myelophthisis process; suggestive of hemolysis] HOLD off metformin; and detromethomorphan- Bupropion. PNH testing-NEGATIVE; complement levels.;  Hemoglobin cascade; G6PD levels-WNL.  April 2024 abdominal ultrasound showed mild splenomegaly.    # 11/07/2022-RELAPSE-start  Rituximab weeklyx 4- Rituximab steroids-to treat autoimmune hemolytic anemia.  #History of heavy menstrual cycles  HISTORY OF PRESENTING ILLNESS: Alone. Ambulating independently.  Danielle [name] 47 y.o.  female with relapsed autoimmune hemolytic anemia steroid responsive -but DAT negative with unclear etiology-s/p  prednisone is here for follow-up.  Patient continues to feel weak.  Dyspnea on exertion.  Patient did not start steroids as recommended at last visit.  Pt is frustrated regarding the carpal tunnel in her hands.   Review of Systems  Constitutional:  Positive for malaise/fatigue. Negative for chills, diaphoresis, fever and weight loss.  HENT:  Negative for nosebleeds and sore throat.   Eyes:  Negative for double vision.  Respiratory:  Positive for cough, shortness of breath and wheezing. Negative for hemoptysis and sputum production.   Cardiovascular:  Negative for  chest pain, palpitations, orthopnea and leg swelling.  Gastrointestinal:  Positive for nausea. Negative for abdominal pain, blood in stool, constipation, diarrhea, heartburn and melena.  Genitourinary:  Negative for dysuria, frequency and urgency.  Musculoskeletal:  Positive for myalgias. Negative for back pain and joint pain.  Skin: Negative.  Negative for itching and rash.  Neurological:  Positive for dizziness and tingling. Negative for focal weakness, weakness and headaches.  Endo/Heme/Allergies:  Does not bruise/bleed easily.  Psychiatric/Behavioral:  Negative for depression. The patient has insomnia. The patient is not nervous/anxious.     MEDICAL HISTORY:  Past Medical History:  Diagnosis Date   Anemia    Depression     SURGICAL HISTORY: Past Surgical History:  Procedure Laterality Date   ABDOMINAL HYSTERECTOMY     COLONOSCOPY WITH PROPOFOL N/A 05/12/2022   Procedure: COLONOSCOPY WITH PROPOFOL;  Surgeon: Midge Minium, MD;  Location: [facility] ENDOSCOPY;  Service: Endoscopy;  Laterality: N/A;   ESOPHAGOGASTRODUODENOSCOPY (EGD) WITH PROPOFOL N/A 05/12/2022   Procedure: ESOPHAGOGASTRODUODENOSCOPY (EGD) WITH PROPOFOL;  Surgeon: Midge Minium, MD;  Location[facility] ENDOSCOPY;  Service: Endoscopy;  Laterality: N/A;   IR BONE MARROW BIOPSY & ASPIRATION  05/28/2022   TONSILLECTOMY      SOCIAL HISTORY: Social History   Socioeconomic History   Marital status: Single    Spouse name: Not on file   Number of children: Not on file   Years of education: Not on file   Highest education level: Not on file  Occupational History   Not on file  Tobacco Use   Smoking status: Former    Current packs/day: 0.00    Average packs/day: 0.5 packs/day for 25.0 years (12.5 ttl pk-yrs)    Types: Cigarettes    Start date: 05/09/1997    Quit date: 05/10/2022    Years  since quitting: 0.4   Smokeless tobacco: Never  Vaping Use   Vaping status: Never Used  Substance and Sexual Activity   Alcohol use: Not  Currently   Drug use: Never   Sexual activity: Not on file  Other Topics Concern   Not on file  Social History Narrative   Lives in [place] with 4 kids; stay at home [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    Social Determinants of Health   Financial Resource Strain: Not on file  Food Insecurity: No Food Insecurity (05/11/2022)   Hunger Vital Sign    Worried About Running Out of Food in the Last Year: Never true    Ran Out of Food in the Last Year: Never true  Transportation Needs: Not on file  Physical Activity: Not on file  Stress: Not on file  Social Connections: Not on file  Intimate Partner Violence: Not on file    FAMILY HISTORY: Family History  Problem Relation Age of Onset   Breast cancer Paternal Aunt     ALLERGIES:  has No Known Allergies.  MEDICATIONS:  Current Outpatient Medications  Medication Sig Dispense Refill   folic acid (FOLVITE) 1 MG tablet Take 1 tablet (1 mg total) by mouth daily. 90 tablet 1   gabapentin (NEURONTIN) 100 MG capsule Take 1 capsule by mouth 3 (three) times daily.     metFORMIN (GLUCOPHAGE) 500 MG tablet Take 500 mg by mouth 2 (two) times daily with a meal.     predniSONE (DELTASONE) 20 MG tablet Once a day with food  in AM: Take 5 pills a day for the next 1 week; and then 4 pills a day for the next 1 week-do not stop until further directed. 100 tablet 0   Semaglutide-Weight Management (WEGOVY) 0.25 MG/0.5ML SOAJ Inject 0.25 mg into the skin once a week.     albuterol (VENTOLIN HFA) 108 (90 Base) MCG/ACT inhaler Inhale 2 puffs into the lungs every 6 (six) hours as needed for wheezing or shortness of breath. (Patient not taking: Reported on 10/31/2022) 8 g 2   Dextromethorphan-Bupropion (AUVELITY PO) Take 1 tablet by mouth as directed. (Patient not taking: Reported on 06/25/2022)     predniSONE (DELTASONE) 5 MG tablet recommend prednisone 5 mg once a day for 1 week; and then 1 pill every other day for 1 week; and then stop. (Patient  not taking: Reported on 08/22/2022) 10 tablet 0   traMADol (ULTRAM) 50 MG tablet Take 1 tablet (50 mg total) by mouth every 8 (eight) hours as needed. (Patient not taking: Reported on 08/22/2022) 30 tablet 0   venlafaxine XR (EFFEXOR-XR) 37.5 MG 24 hr capsule Take 37.5 mg by mouth daily. (Patient not taking: Reported on 10/31/2022)     No current facility-administered medications for this visit.   Facility-Administered Medications Ordered in Other Visits  Medication Dose Route Frequency Provider Last Rate Last Admin   dexamethasone (DECADRON) 20 mg in sodium chloride 0.9 % 50 mL IVPB  20 mg Intravenous Once Earna Coder, MD 208 mL/hr at 11/07/22 0949 20 mg at 11/07/22 0949   riTUXimab-pvvr (RUXIENCE) 800 mg in sodium chloride 0.9 % 250 mL (2.4242 mg/mL) infusion  375 mg/m2 (Order-Specific) Intravenous Once Earna Coder, MD          PHYSICAL EXAMINATION:   Vitals:   11/07/22 0847  BP: 95/65  Pulse: 90  Temp: 97.8 F (36.6 C)  SpO2: 98%    Filed Weights   11/07/22 0847  Weight:  225 lb (102.1 kg)     Physical Exam HENT:     Head: Normocephalic and atraumatic.     Mouth/Throat:     Pharynx: No oropharyngeal exudate.  Eyes:     Pupils: Pupils are equal, round, and reactive to light.  Cardiovascular:     Rate and Rhythm: Normal rate and regular rhythm.  Pulmonary:     Effort: Pulmonary effort is normal. No respiratory distress.     Breath sounds: Normal breath sounds. No wheezing.  Abdominal:     General: Bowel sounds are normal. There is no distension.     Palpations: Abdomen is soft. There is no mass.     Tenderness: There is no abdominal tenderness. There is no guarding or rebound.  Musculoskeletal:        General: No tenderness. Normal range of motion.     Cervical back: Normal range of motion and neck supple.  Skin:    General: Skin is warm.  Neurological:     Mental Status: She is alert and oriented to person, place, and time.  Psychiatric:         Mood and Affect: Affect normal.     LABORATORY DATA:  I have reviewed the data as listed Lab Results  Component Value Date   WBC 4.1 11/07/2022   HGB 9.2 (L) 11/07/2022   HCT 25.9 (L) 11/07/2022   MCV 105.7 (H) 11/07/2022   PLT 167 11/07/2022   Recent Labs    05/10/22 2030 05/11/22 0901 05/12/22 0458 06/10/22 1013 06/17/22 0959 07/11/22 1038 08/22/22 1011 10/31/22 0900  NA  --  138   < > 139   < > 138 138 138  K  --  3.7   < > 3.6   < > 3.9 4.3 3.7  CL  --  107   < > 103   < > 101 106 105  CO2  --  25   < > 26   < > 28 23 25   GLUCOSE  --  131*   < > 132*   < > 99 106* 139*  BUN  --  9   < > 17   < > 17 14 16   CREATININE  --  0.53   < > 0.71   < > 0.67 0.63 0.58  CALCIUM  --  8.7*   < > 9.2   < > 9.3 9.1 9.1  GFRNONAA  --  >60   < > >60   < > >60 >60 >60  PROT 6.6 6.3*   < > 6.7  --   --  7.1 7.1  ALBUMIN 3.6 3.3*   < > 3.9  --   --  3.7 4.0  AST 32 36   < > 21  --   --  17 18  ALT 25 25   < > 18  --   --  13 16  ALKPHOS 51 53   < > 41  --   --  62 68  BILITOT 1.5* 1.9*   < > 0.9  --   --  0.6 1.2  BILIDIR 0.2 0.3*  --   --   --   --   --   --   IBILI 1.3* 1.6*  --   --   --   --   --   --    < > = values in this interval not displayed.     No results found.  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  JAK2 testing negative.  However bone marrow biopsy- [MAY 2024-negative for any myelophthisis process; suggestive of hemolysis] HOLD off metformin; and detromethomorphan- Bupropion. PNH testing-NEGATIVE; complement levels.;  Hemoglobin cascade; G6PD levels-WNL.  April 2024 abdominal ultrasound showed mild splenomegaly.  # Hemoglobin is lower today at 9.2.  I recommend proceeding with rituximab steroids-to treat autoimmune hemolytic anemia.  Patient did not start prednisone as recommended; plan dex 20 IV with rituximab.  Again discussed compliance with her medications. Start folic acid.   #  START  tomorrow- prednisone 100 mg/day x1 week; and then 80 mg/day-and will taper as needed.  Discussed mechanism of action of rituximab;and also potential side effects including but not limited to infusion reactions;rare infections/activation including hepatitis. Hepatitis work-up- NEGATIVE.   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   # Tingling/ numbness BILATERAL UE- carpal tunnel vs others-status post evaluation with [facility] surgery planned in October/November.  However this might have to wait until patient is stable from hematologic standpoint.  #Smoker- quit April 2024-  # Vaccinations: Flu host [oct 2024].   # DISPOSITION: # Rituximab today  # as planned follow up in 1 week-MD; rituximab- cbc/;bmp-LDH # in follow up in 2 weeks- APP  or X-MD- labs- cbc;bmp;  LDH; rituximab-- Dr.B  All questions were answered. The patient knows to call the clinic with any problems, questions or concerns.    Earna Coder, MD 11/07/2022 9:50 AM

## 2022-11-07 NOTE — ED Triage Notes (Signed)
Pt comes via EMS from cancer center with c/o possible allergic reaction to her 1st time cancer med. Pt states hives and sob.  Pt given epi and 125 solu medrol, 50 ben and 20 pepcid. 117/73 100% RA ST on monitor at110

## 2022-11-07 NOTE — Assessment & Plan Note (Addendum)
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  JAK2 testing negative.  However bone marrow biopsy- [MAY 2024-negative for any myelophthisis process; suggestive of hemolysis] HOLD off metformin; and detromethomorphan- Bupropion. PNH testing-NEGATIVE; complement levels.;  Hemoglobin cascade; G6PD levels-WNL.  April 2024 abdominal ultrasound showed mild splenomegaly.  # Hemoglobin is lower today at 9.2.  I recommend proceeding with rituximab steroids-to treat autoimmune hemolytic anemia. Patient did not start prednisone as recommended; plan dex 20 IV with rituximab.  Again discussed compliance with her medications. Start folic acid.   #  START  tomorrow- prednisone 100 mg/day x1 week; and then 80 mg/day-and will taper as needed.  Discussed mechanism of action of rituximab;and also potential side effects including but not limited to infusion reactions;rare infections/activation including hepatitis. Hepatitis work-up- NEGATIVE.   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   # Tingling/ numbness BILATERAL UE- carpal tunnel vs others-status post evaluation with [facility] surgery planned in October/November.  However this might have to wait until patient is stable from hematologic standpoint.  #Smoker- quit April 2024-  # Vaccinations: Flu host [oct 2024].   # DISPOSITION: # Rituximab today  # as planned follow up in 1 week-MD; rituximab- cbc/;bmp-LDH # in follow up in 2 weeks- APP  or X-MD- labs- cbc;bmp;  LDH; rituximab-- Dr.B ----------------------------   Addendum: Patient noted to have acute infusion reaction to rituximab-grade 4 [severe bronchospasm; syncopal episode throat swelling]-symptoms improved after EpiPen injection.  Patient sent over to the  emergency room.  Discontinue rituximab. Cancel rituximab infusions.

## 2022-11-07 NOTE — Progress Notes (Signed)
I checked on patient in the emergency room.  Clinically stable.  Spoke to patient and mother.  Complaints of numbness in the left arm-defer to ER physician.  Recommend taking prednisone 100 mg a day as ordered for anemia starting tomorrow. Patient will keep appointments with the cancer center as planned next week.  Will discontinue rituximab. GB

## 2022-11-07 NOTE — Addendum Note (Signed)
Addended by: Louretta Shorten R on: 11/07/2022 01:01 PM   Modules accepted: Orders

## 2022-11-07 NOTE — ED Provider Notes (Signed)
[facility] Provider Note    Event Date/Time   First MD Initiated Contact with Patient 11/07/22 1404     (approximate)   History   Allergic Reaction   HPI  Danielle [name] is a 47 y.o. female past medical history significant for severe autoimmune hemolytic anemia, anemia, presents to the emergency department following her transfusion.  Patient had infusion of IV rituximab today.  Stated that she started to not feel well and felt very anxious and then felt like her throat was closing and very itchy.  Received epinephrine, IV Solu-Medrol, Benadryl and Pepcid prior to arrival.  Patient states that she feels much better after receiving this medications however she is having significant dizziness.  Feels like she is having double vision and that the room is slightly spinning.  Feels lightheaded.  Did not have the symptoms prior to the infusion.  Denies any falls or trauma.  No neck pain or stiffness.  Denies similar symptoms in the past.  No change in vision.  No chest pain.     Physical Exam   Triage Vital Signs: ED Triage Vitals  Encounter Vitals Group     BP 11/07/22 1241 117/64     Systolic BP Percentile --      Diastolic BP Percentile --      Pulse Rate 11/07/22 1241 100     Resp 11/07/22 1241 18     Temp 11/07/22 1241 98.3 F (36.8 C)     Temp Source 11/07/22 1241 Oral     SpO2 11/07/22 1241 99 %     Weight 11/07/22 1241 225 lb (102.1 kg)     Height 11/07/22 1241 5\' 3"  (1.6 m)     Head Circumference --      Peak Flow --      Pain Score 11/07/22 1228 2     Pain Loc --      Pain Education --      Exclude from Growth Chart --     Most recent vital signs: Vitals:   11/07/22 1730 11/07/22 1819  BP:  101/67  Pulse: 82   Resp:    Temp:    SpO2: 97%     Physical Exam Constitutional:      Appearance: She is well-developed.  HENT:     Head: Atraumatic.  Eyes:     Extraocular Movements: Extraocular movements intact.     Conjunctiva/sclera:  Conjunctivae normal.     Comments: Horizontal nystagmus with bilateral gaze.  Cardiovascular:     Rate and Rhythm: Regular rhythm.  Pulmonary:     Effort: No respiratory distress.  Abdominal:     General: There is no distension.     Tenderness: There is no abdominal tenderness.  Musculoskeletal:        General: Normal range of motion.     Cervical back: Normal range of motion.  Skin:    General: Skin is warm.  Neurological:     Mental Status: She is alert. Mental status is at baseline.     GCS: GCS eye subscore is 4. GCS verbal subscore is 5. GCS motor subscore is 6.     Cranial Nerves: Cranial nerves 2-12 are intact.     Sensory: Sensation is intact.     Motor: Motor function is intact.     Coordination: Coordination is intact.     Gait: Gait is intact.     Deep Tendon Reflexes: Reflexes are normal and symmetric.  IMPRESSION / MDM / ASSESSMENT AND PLAN / ED COURSE  I reviewed the triage vital signs and the nursing notes.  Differential diagnosis including anaphylactic reaction, intracranial hemorrhage, central vertigo, dissection, vertebrobasilar insufficiency, medication side effect  EKG  I, Corena Herter, the attending physician, personally viewed and interpreted this ECG.   Rate: 101  Rhythm: Sinus tachycardia  Axis: Normal  Intervals: Normal  ST&T Change: None  No tachycardic or bradycardic dysrhythmias while on cardiac telemetry.  RADIOLOGY I independently reviewed imaging, my interpretation of imaging: CTA head and neck without signs of intracranial hemorrhage.  Read as no acute findings.  LABS (all labs ordered are listed, but only abnormal results are displayed) Labs interpreted as -    Labs Reviewed  BASIC METABOLIC PANEL - Abnormal; Notable for the following components:      Result Value   Glucose, Bld 248 (*)    Calcium 8.8 (*)    All other components within normal limits  CBC - Abnormal; Notable for the following components:   RBC 2.61 (*)     Hemoglobin 10.0 (*)    HCT 27.3 (*)    MCV 104.6 (*)    MCH 38.3 (*)    MCHC 36.6 (*)    RDW 22.0 (*)    nRBC 0.9 (*)    All other components within normal limits     MDM    On chart review patient is on high-dose steroids for autoimmune hemolytic anemia.  Received an IV transfusion of rituximab and was treated for anaphylactic reaction prior to arrival.  Given her ongoing vertiginous symptoms and headache will obtain CTA for further evaluation of a central cause of vertigo.  Given her known history of anemia no other signs of a bleed but will evaluate for intracranial hemorrhage.  CTA without signs of dissection or intracranial hemorrhage.  Given p.o. meclizine  On reevaluation patient states that she is feeling much better and had resolution of her symptoms.  No longer with dizziness and felt like the meclizine improved her symptoms.  Patient has been prescribed 100 mg of prednisone as an outpatient with her hematologist, discussed continuing and to follow-up closely with hematology.  Discussed close follow-up with her primary care physician and given return precautions for any worsening symptoms.   PROCEDURES:  Critical Care performed: No  Procedures  Patient's presentation is most consistent with acute presentation with potential threat to life or bodily function.   MEDICATIONS ORDERED IN ED: Medications  meclizine (ANTIVERT) tablet 25 mg (25 mg Oral Given 11/07/22 1505)  acetaminophen (TYLENOL) tablet 1,000 mg (1,000 mg Oral Given 11/07/22 1505)  iohexol (OMNIPAQUE) 350 MG/ML injection 75 mL (75 mLs Intravenous Contrast Given 11/07/22 1615)    FINAL CLINICAL IMPRESSION(S) / ED DIAGNOSES   Final diagnoses:  Allergic reaction, initial encounter  Dizzy     Rx / DC Orders   ED Discharge Orders          Ordered    meclizine (ANTIVERT) 25 MG tablet  3 times daily PRN        11/07/22 1850    hydrOXYzine (ATARAX) 25 MG tablet  3 times daily PRN        11/07/22 1851              Note:  This document was prepared using Dragon voice recognition software and may include unintentional dictation errors.   Corena Herter, MD 11/07/22 1956

## 2022-11-07 NOTE — Discharge Instructions (Addendum)
You are seen in the emergency department after an anaphylactic reaction.  You have a prescription for steroids already prescribed to you, make sure that you take this as prescribed.  You are also given a prescription for nondrowsy Benadryl and for meclizine which is a medication that can help with dizziness.  Call and follow-up with your primary care provider.  You had a CT scan of your head and neck that did not show any abnormalities.  Return to the emergency department if you have any worsening symptoms.  Thank you for choosing [place] for your health care, it was my pleasure to care for you today!  Corena Herter, MD

## 2022-11-07 NOTE — Progress Notes (Signed)
Patient told me that she isn't taking the prednisone that Dr. B prescribed to her because she don't like the way it makes her feel. She wants to make sure that she can still get her treatment today even though she hasn't been taking it?

## 2022-11-14 ENCOUNTER — Other Ambulatory Visit: Payer: Medicaid Other

## 2022-11-14 ENCOUNTER — Inpatient Hospital Stay (HOSPITAL_BASED_OUTPATIENT_CLINIC_OR_DEPARTMENT_OTHER): Payer: Medicaid Other | Admitting: Internal Medicine

## 2022-11-14 ENCOUNTER — Ambulatory Visit: Payer: Medicaid Other | Admitting: Internal Medicine

## 2022-11-14 ENCOUNTER — Inpatient Hospital Stay: Payer: Medicaid Other

## 2022-11-14 ENCOUNTER — Ambulatory Visit: Payer: Medicaid Other

## 2022-11-14 ENCOUNTER — Encounter: Payer: Self-pay | Admitting: Internal Medicine

## 2022-11-14 VITALS — BP 119/70 | HR 100 | Temp 98.5°F | Ht 63.0 in | Wt 226.0 lb

## 2022-11-14 DIAGNOSIS — D61818 Other pancytopenia: Secondary | ICD-10-CM

## 2022-11-14 DIAGNOSIS — D649 Anemia, unspecified: Secondary | ICD-10-CM

## 2022-11-14 DIAGNOSIS — D472 Monoclonal gammopathy: Secondary | ICD-10-CM | POA: Diagnosis not present

## 2022-11-14 LAB — CBC WITH DIFFERENTIAL (CANCER CENTER ONLY)
Abs Immature Granulocytes: 0.05 10*3/uL (ref 0.00–0.07)
Basophils Absolute: 0 10*3/uL (ref 0.0–0.1)
Basophils Relative: 0 %
Eosinophils Absolute: 0.1 10*3/uL (ref 0.0–0.5)
Eosinophils Relative: 2 %
HCT: 30.6 % — ABNORMAL LOW (ref 36.0–46.0)
Hemoglobin: 10.8 g/dL — ABNORMAL LOW (ref 12.0–15.0)
Immature Granulocytes: 1 %
Lymphocytes Relative: 19 %
Lymphs Abs: 1.4 10*3/uL (ref 0.7–4.0)
MCH: 39.1 pg — ABNORMAL HIGH (ref 26.0–34.0)
MCHC: 35.3 g/dL (ref 30.0–36.0)
MCV: 110.9 fL — ABNORMAL HIGH (ref 80.0–100.0)
Monocytes Absolute: 0.4 10*3/uL (ref 0.1–1.0)
Monocytes Relative: 6 %
Neutro Abs: 5.2 10*3/uL (ref 1.7–7.7)
Neutrophils Relative %: 72 %
Platelet Count: 162 10*3/uL (ref 150–400)
RBC: 2.76 MIL/uL — ABNORMAL LOW (ref 3.87–5.11)
RDW: 23.7 % — ABNORMAL HIGH (ref 11.5–15.5)
WBC Count: 7.1 10*3/uL (ref 4.0–10.5)
nRBC: 0 % (ref 0.0–0.2)

## 2022-11-14 LAB — BASIC METABOLIC PANEL
Anion gap: 7 (ref 5–15)
BUN: 15 mg/dL (ref 6–20)
CO2: 25 mmol/L (ref 22–32)
Calcium: 8.9 mg/dL (ref 8.9–10.3)
Chloride: 105 mmol/L (ref 98–111)
Creatinine, Ser: 0.69 mg/dL (ref 0.44–1.00)
GFR, Estimated: >60 mL/min (ref >60)
Glucose, Bld: 151 mg/dL — ABNORMAL HIGH (ref 70–99)
Potassium: 4 mmol/L (ref 3.5–5.1)
Sodium: 137 mmol/L (ref 135–145)

## 2022-11-14 LAB — LACTATE DEHYDROGENASE: LDH: 317 U/L — ABNORMAL HIGH (ref 98–192)

## 2022-11-14 MED ORDER — PREDNISONE 20 MG PO TABS: ORAL_TABLET | ORAL | 0 refills | Status: DC

## 2022-11-14 NOTE — Progress Notes (Signed)
Seen in ED last visit for anaphylactic reaction to infusion. Had a CT scan.  C/o blurred vision since last week.   Fatigue/weakness: yes Dyspena: yes Light headedness: yes Blood in stool: no

## 2022-11-14 NOTE — Assessment & Plan Note (Addendum)
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  JAK2 testing negative.  However bone marrow biopsy- [MAY 2024-negative for any myelophthisis process; suggestive of hemolysis] HOLD off metformin; and detromethomorphan- Bupropion. PNH testing-NEGATIVE; complement levels.;  Hemoglobin cascade; G6PD levels-WNL.  April 2024 abdominal ultrasound showed mild splenomegaly.  October 2024 discontinued rituximab given anaphylactic reaction [Addendum: Patient noted to have acute infusion reaction to rituximab-grade 4 [severe bronchospasm; syncopal episode throat swelling]-symptoms improved after EpiPen injection.  Patient sent over to the emergency room.  Discontinue rituximab. Cancel rituximab infusions.].  # Patient currently on prednisone 100 mg a day x1 week; continue prednisone 100 mg a day for the next 1 week; and the taper to 80 mg/day x2 weeks. If improving/stable-recommend prednisone 80 mg a day for  2 weeks; and plan continue slow taper. 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  Stable.   # Tingling/ numbness BILATERAL UE- carpal tunnel vs others-status post evaluation with [facility] surgery planned in October/November.  However this might have to wait until patient is stable from hematologic standpoint.  #Smoker- quit April 2024-  # Vaccinations: Flu host [oct 2024].   # DISPOSITION: # follow up in 2 weeks- APP  labs- cbc; reticulocyte count ;BMP;  LDH;  #  follow up in 4 weeks- MD;  labs- cbc; reticulocyte count ;BMP;  LDH; Dr.B

## 2022-11-14 NOTE — Progress Notes (Signed)
[facility] CONSULT NOTE  Patient Care Team: Armando Gang, FNP as PCP - General (Family Medicine) Earna Coder, MD as Consulting Physician (Oncology)  CHIEF COMPLAINTS/PURPOSE OF CONSULTATION: ANEMIA  HEMATOLOGY HISTORY  # IRON DEFICIENCY ANEMIA CHRONIC [since 2019] AUG 2021- hb-9; MCV- 63; Iron sat- 3%; EGD > 7 years ago [[place]]; colonoscopy/capsule-NA; PO iron constipates.   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  JAK2 testing negative.  However bone marrow biopsy- [MAY 2024-negative for any myelophthisis process; suggestive of hemolysis] HOLD off metformin; and detromethomorphan- Bupropion. PNH testing-NEGATIVE; complement levels.;  Hemoglobin cascade; G6PD levels-WNL.  April 2024 abdominal ultrasound showed mild splenomegaly.    # 11/07/2022-RELAPSE-start  Rituximab X1 [OCT 11th, 2024- ]G-4-anaphylactic reaction needing EpiPen transporting to the emergency room.-Discontinue further rituximab.  #History of heavy menstrual cycles  HISTORY OF PRESENTING ILLNESS: Alone. Ambulating independently.  Danielle [name] 47 y.o.  female with relapsed autoimmune hemolytic anemia steroid responsive -but DAT negative [ [suspect IgA] -is here for follow-up.  Patient had a severe anaphylactic reaction to rituximab needing EpiPen.  Patient transported to the emergency room; and later discharged that day.  Patient complains of mild dyspnea on exertion.  Patient is taking prednisone 100 mg a day for the last 1 week.   Continues to have mild tingling intermittently upper extremities because of carpal tunnel.  Review of Systems  Constitutional:  Positive for malaise/fatigue. Negative for chills, diaphoresis, fever and weight loss.  HENT:  Negative for  nosebleeds and sore throat.   Eyes:  Negative for double vision.  Respiratory:  Positive for cough, shortness of breath and wheezing. Negative for hemoptysis and sputum production.   Cardiovascular:  Negative for chest pain, palpitations, orthopnea and leg swelling.  Gastrointestinal:  Positive for nausea. Negative for abdominal pain, blood in stool, constipation, diarrhea, heartburn and melena.  Genitourinary:  Negative for dysuria, frequency and urgency.  Musculoskeletal:  Positive for myalgias. Negative for back pain and joint pain.  Skin: Negative.  Negative for itching and rash.  Neurological:  Positive for dizziness and tingling. Negative for focal weakness, weakness and headaches.  Endo/Heme/Allergies:  Does not bruise/bleed easily.  Psychiatric/Behavioral:  Negative for depression. The patient has insomnia. The patient is not nervous/anxious.     MEDICAL HISTORY:  Past Medical History:  Diagnosis Date   Anemia    Depression     SURGICAL HISTORY: Past Surgical History:  Procedure Laterality Date   ABDOMINAL HYSTERECTOMY     COLONOSCOPY WITH PROPOFOL N/A 05/12/2022   Procedure: COLONOSCOPY WITH PROPOFOL;  Surgeon: Midge Minium, MD;  Location: [facility] ENDOSCOPY;  Service: Endoscopy;  Laterality: N/A;   ESOPHAGOGASTRODUODENOSCOPY (EGD) WITH PROPOFOL N/A 05/12/2022   Procedure: ESOPHAGOGASTRODUODENOSCOPY (EGD) WITH PROPOFOL;  Surgeon: Midge Minium, MD;  Location: [facility] ENDOSCOPY;  Service: Endoscopy;  Laterality: N/A;   IR BONE MARROW BIOPSY & ASPIRATION  05/28/2022   TONSILLECTOMY      SOCIAL HISTORY: Social History   Socioeconomic History   Marital status: Single    Spouse name: Not on file   Number of children: Not on file   Years of education: Not on file   Highest education level: Not on file  Occupational History   Not on file  Tobacco Use   Smoking status: Former    Current packs/day: 0.00  Average packs/day: 0.5 packs/day for 25.0 years (12.5 ttl pk-yrs)    Types:  Cigarettes    Start date: 05/09/1997    Quit date: 05/10/2022    Years since quitting: 0.5   Smokeless tobacco: Never  Vaping Use   Vaping status: Never Used  Substance and Sexual Activity   Alcohol use: Not Currently   Drug use: Never   Sexual activity: Not on file  Other Topics Concern   Not on file  Social History Narrative   Lives in [place] with 4 kids; stay at home [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    Social Determinants of Health   Financial Resource Strain: Not on file  Food Insecurity: No Food Insecurity (05/11/2022)   Hunger Vital Sign    Worried About Running Out of Food in the Last Year: Never true    Ran Out of Food in the Last Year: Never true  Transportation Needs: Not on file  Physical Activity: Not on file  Stress: Not on file  Social Connections: Not on file  Intimate Partner Violence: Not on file    FAMILY HISTORY: Family History  Problem Relation Age of Onset   Breast cancer Paternal Aunt     ALLERGIES:  is allergic to rituxan [rituximab].  MEDICATIONS:  Current Outpatient Medications  Medication Sig Dispense Refill   albuterol (VENTOLIN HFA) 108 (90 Base) MCG/ACT inhaler Inhale 2 puffs into the lungs every 6 (six) hours as needed for wheezing or shortness of breath. 8 g 2   folic acid (FOLVITE) 1 MG tablet Take 1 tablet (1 mg total) by mouth daily. 90 tablet 1   gabapentin (NEURONTIN) 100 MG capsule Take 1 capsule by mouth 3 (three) times daily.     hydrOXYzine (ATARAX) 25 MG tablet Take 1 tablet (25 mg total) by mouth 3 (three) times daily as needed for itching. 30 tablet 0   meclizine (ANTIVERT) 25 MG tablet Take 1 tablet (25 mg total) by mouth 3 (three) times daily as needed for dizziness. 30 tablet 0   metFORMIN (GLUCOPHAGE) 500 MG tablet Take 500 mg by mouth 2 (two) times daily with a meal.     venlafaxine XR (EFFEXOR-XR) 37.5 MG 24 hr capsule Take 37.5 mg by mouth daily.     predniSONE (DELTASONE) 20 MG tablet Once a day with  food  in AM: Take 5 pills a day for the next 1 week; and then 4 pills a day for the next 2 week-do not stop until further directed. 100 tablet 0   Semaglutide-Weight Management (WEGOVY) 0.25 MG/0.5ML SOAJ Inject 0.25 mg into the skin once a week. (Patient not taking: Reported on 11/14/2022)     No current facility-administered medications for this visit.      PHYSICAL EXAMINATION:   Vitals:   11/14/22 1027  BP: 119/70  Pulse: 100  Temp: 98.5 F (36.9 C)  SpO2: 100%     Filed Weights   11/14/22 1027  Weight: 226 lb (102.5 kg)      Physical Exam HENT:     Head: Normocephalic and atraumatic.     Mouth/Throat:     Pharynx: No oropharyngeal exudate.  Eyes:     Pupils: Pupils are equal, round, and reactive to light.  Cardiovascular:     Rate and Rhythm: Normal rate and regular rhythm.  Pulmonary:     Effort: Pulmonary effort is normal. No respiratory distress.     Breath sounds: Normal breath sounds. No wheezing.  Abdominal:  General: Bowel sounds are normal. There is no distension.     Palpations: Abdomen is soft. There is no mass.     Tenderness: There is no abdominal tenderness. There is no guarding or rebound.  Musculoskeletal:        General: No tenderness. Normal range of motion.     Cervical back: Normal range of motion and neck supple.  Skin:    General: Skin is warm.  Neurological:     Mental Status: She is alert and oriented to person, place, and time.  Psychiatric:        Mood and Affect: Affect normal.     LABORATORY DATA:  I have reviewed the data as listed Lab Results  Component Value Date   WBC 7.1 11/14/2022   HGB 10.8 (L) 11/14/2022   HCT 30.6 (L) 11/14/2022   MCV 110.9 (H) 11/14/2022   PLT 162 11/14/2022   Recent Labs    05/10/22 2030 05/11/22 0901 05/12/22 0458 06/10/22 1013 06/17/22 0959 08/22/22 1011 10/31/22 0900 11/07/22 1242 11/14/22 1031  NA  --  138   < > 139   < > 138 138 137 137  K  --  3.7   < > 3.6   < > 4.3 3.7  3.6 4.0  CL  --  107   < > 103   < > 106 105 102 105  CO2  --  25   < > 26   < > 23 25 22 25   GLUCOSE  --  131*   < > 132*   < > 106* 139* 248* 151*  BUN  --  9   < > 17   < > 14 16 13 15   CREATININE  --  0.53   < > 0.71   < > 0.63 0.58 0.62 0.69  CALCIUM  --  8.7*   < > 9.2   < > 9.1 9.1 8.8* 8.9  GFRNONAA  --  >60   < > >60   < > >60 >60 >60 >60  PROT 6.6 6.3*   < > 6.7  --  7.1 7.1  --   --   ALBUMIN 3.6 3.3*   < > 3.9  --  3.7 4.0  --   --   AST 32 36   < > 21  --  17 18  --   --   ALT 25 25   < > 18  --  13 16  --   --   ALKPHOS 51 53   < > 41  --  62 68  --   --   BILITOT 1.5* 1.9*   < > 0.9  --  0.6 1.2  --   --   BILIDIR 0.2 0.3*  --   --   --   --   --   --   --   IBILI 1.3* 1.6*  --   --   --   --   --   --   --    < > = values in this interval not displayed.     CT Angio Head Neck W WO CM  Result Date: 11/07/2022 CLINICAL DATA:  [employer]. EXAM: CT ANGIOGRAPHY HEAD AND NECK WITH AND WITHOUT CONTRAST TECHNIQUE: Multidetector CT imaging of the head and neck was performed using the standard protocol during bolus administration of intravenous contrast. Multiplanar CT image reconstructions and MIPs were obtained to evaluate the vascular anatomy. Carotid stenosis measurements (when  applicable) are obtained utilizing NASCET criteria, using the distal internal carotid diameter as the denominator. RADIATION DOSE REDUCTION: This exam was performed according to the departmental dose-optimization program which includes automated exposure control, adjustment of the mA and/or kV according to patient size and/or use of iterative reconstruction technique. CONTRAST:  75mL OMNIPAQUE IOHEXOL 350 MG/ML SOLN COMPARISON:  MRI brain 05/10/2022. FINDINGS: CT HEAD FINDINGS Brain: No acute intracranial hemorrhage. Gray-white differentiation is preserved. No hydrocephalus or extra-axial collection. No mass effect or midline shift. Vascular: No hyperdense vessel or unexpected calcification. Skull: No calvarial  fracture or suspicious bone lesion. Skull base is unremarkable. Sinuses/Orbits: No acute finding. Other: None. Review of the MIP images confirms the above findings CTA NECK FINDINGS Aortic arch: Three-vessel arch configuration. Arch vessel origins are patent. Right carotid system: No evidence of dissection, stenosis (50% or greater), or occlusion. Left carotid system: No evidence of dissection, stenosis (50% or greater), or occlusion. Vertebral arteries: Codominant. No evidence of dissection, stenosis (50% or greater), or occlusion. Skeleton: Negative. Other neck: Negative. Upper chest: Enlarged main pulmonary artery, suggestive of pulmonary hypertension. Review of the MIP images confirms the above findings CTA HEAD FINDINGS Anterior circulation: Intracranial ICAs are patent without stenosis or aneurysm. The proximal ACAs and MCAs are patent without stenosis or aneurysm. Distal branches are symmetric. Posterior circulation: Normal basilar artery. The SCAs, AICAs and PICAs are patent proximally. The PCAs are patent proximally without stenosis or aneurysm. Distal branches are symmetric. Venous sinuses: As permitted by contrast timing, patent. Anatomic variants: None. Review of the MIP images confirms the above findings IMPRESSION: 1. No acute intracranial abnormality. 2. No large vessel occlusion, hemodynamically significant stenosis, or aneurysm in the head or neck. 3. Enlarged main pulmonary artery, suggestive of pulmonary hypertension. Electronically Signed   By: Orvan Falconer M.D.   On: 11/07/2022 18:19   DG Chest 2 View  Result Date: 11/07/2022 CLINICAL DATA:  Shortness of breath EXAM: CHEST - 2 VIEW COMPARISON:  05/10/2022 x-ray and CT angiogram chest FINDINGS: Slight linear opacity left lung base likely scar or atelectasis. No consolidation, pneumothorax or effusion. No edema. Normal cardiopericardial silhouette. Film is under inflated. Degenerative changes along the spine. IMPRESSION: Slight left  basilar scar or atelectasis. Electronically Signed   By: Karen Kays M.D.   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  JAK2 testing negative.  However bone marrow biopsy- [MAY 2024-negative for any myelophthisis process; suggestive of hemolysis] HOLD off metformin; and detromethomorphan- Bupropion. PNH testing-NEGATIVE; complement levels.;  Hemoglobin cascade; G6PD levels-WNL.  April 2024 abdominal ultrasound showed mild splenomegaly.  October 2024 discontinued rituximab given anaphylactic reaction [Addendum: Patient noted to have acute infusion reaction to rituximab-grade 4 [severe bronchospasm; syncopal episode throat swelling]-symptoms improved after EpiPen injection.  Patient sent over to the emergency room.  Discontinue rituximab. Cancel rituximab infusions.].  # Patient currently on prednisone 100 mg a day x1 week; continue prednisone 100 mg a day for the next 1 week; and the taper to 80 mg/day x2 weeks. If improving/stable-recommend prednisone 80 mg a day for  2 weeks; and plan continue slow taper. 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  Stable.   # Tingling/ numbness BILATERAL UE-  carpal tunnel vs others-status post evaluation with [facility] surgery planned in October/November.  However this might have to wait until patient is stable from hematologic standpoint.  #Smoker- quit April 2024-  # Vaccinations: Flu host [oct 2024].   # DISPOSITION: # follow up in 2 weeks- APP  labs- cbc; reticulocyte count ;BMP;  LDH;  #  follow up in 4 weeks- MD;  labs- cbc; reticulocyte count ;BMP;   LD[facility]; Dr.B     All questions were answered. The patient knows to call the clinic with any problems, questions or concerns.    Earna Coder, MD 11/14/2022 3:43 PM

## 2022-11-14 NOTE — Patient Instructions (Signed)
#    continue prednisone 100 mg a day for the next 1 week; and then recommend prednisone 80 mg a day for the 1 more week;

## 2022-11-21 ENCOUNTER — Ambulatory Visit: Payer: Medicaid Other

## 2022-11-21 ENCOUNTER — Ambulatory Visit: Payer: Medicaid Other | Admitting: Nurse Practitioner

## 2022-11-21 ENCOUNTER — Other Ambulatory Visit: Payer: Medicaid Other

## 2022-11-21 ENCOUNTER — Inpatient Hospital Stay: Payer: Medicaid Other

## 2022-11-28 ENCOUNTER — Inpatient Hospital Stay: Payer: Medicaid Other | Attending: Internal Medicine

## 2022-11-28 ENCOUNTER — Inpatient Hospital Stay: Payer: Medicaid Other | Admitting: Nurse Practitioner

## 2022-11-28 DIAGNOSIS — R161 Splenomegaly, not elsewhere classified: Secondary | ICD-10-CM | POA: Insufficient documentation

## 2022-11-28 DIAGNOSIS — D591 Autoimmune hemolytic anemia, unspecified: Secondary | ICD-10-CM | POA: Insufficient documentation

## 2022-11-28 DIAGNOSIS — D472 Monoclonal gammopathy: Secondary | ICD-10-CM | POA: Insufficient documentation

## 2022-11-28 DIAGNOSIS — D509 Iron deficiency anemia, unspecified: Secondary | ICD-10-CM | POA: Insufficient documentation

## 2022-12-14 NOTE — Assessment & Plan Note (Signed)
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  JAK2 testing negative.  However bone marrow biopsy- [MAY 2024-negative for any myelophthisis process; suggestive of hemolysis] HOLD off metformin; and detromethomorphan- Bupropion. PNH testing-NEGATIVE; complement levels.;  Hemoglobin cascade; G6PD levels-WNL.  April 2024 abdominal ultrasound showed mild splenomegaly.  October 2024 discontinued rituximab given anaphylactic reaction [Addendum: Patient noted to have acute infusion reaction to rituximab-grade 4 [severe bronchospasm; syncopal episode throat swelling]-symptoms improved after EpiPen injection.  Patient sent over to the emergency room.  Discontinue rituximab.  # continue to taper steroids to prednisone 40  mg a day- # Take prednisone-40 mg/day [2 pills]-Daily for 1 week; and then take 1 pill a day-do not stop until further directions. Consider adding Oral Immunosupressant like mycophenolate/immuran if not improving down the line [steroid sparing]-today hemoglobin is 11.3 monitor for now/plan as above for steroids.  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  Stable.   # Tingling/ numbness BILATERAL UE- carpal tunnel vs others-status post evaluation with [facility] surgery planned in  on dec 13th. Planed.   # Smoker- quit April 2024-  # Vaccinations: Flu host [oct 2024].   # DISPOSITION: #  follow up in 3 weeks- MD;  labs- cbc; reticulocyte count ;BMP;  LDH; Dr.B

## 2022-12-14 NOTE — Progress Notes (Unsigned)
[facility] CONSULT NOTE  Patient Care Team: Armando Gang, FNP as PCP - General (Family Medicine) Earna Coder, MD as Consulting Physician (Oncology)  CHIEF COMPLAINTS/PURPOSE OF CONSULTATION: ANEMIA  HEMATOLOGY HISTORY  # IRON DEFICIENCY ANEMIA CHRONIC [since 2019] AUG 2021- hb-9; MCV- 63; Iron sat- 3%; EGD > 7 years ago [[place]]; colonoscopy/capsule-NA; PO iron constipates.   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  JAK2 testing negative.  However bone marrow biopsy- [MAY 2024-negative for any myelophthisis process; suggestive of hemolysis] HOLD off metformin; and detromethomorphan- Bupropion. PNH testing-NEGATIVE; complement levels.;  Hemoglobin cascade; G6PD levels-WNL.  April 2024 abdominal ultrasound showed mild splenomegaly.    # 11/07/2022-RELAPSE-start  Rituximab X1 [OCT 11th, 2024- ]G-4-anaphylactic reaction needing EpiPen transporting to the emergency room.-Discontinue further rituximab.  #History of heavy menstrual cycles  HISTORY OF PRESENTING ILLNESS: Alone. Ambulating independently.  Danielle [name] 47 y.o.  female with relapsed autoimmune hemolytic anemia steroid responsive -but DAT negative [ [suspect IgA] -is here for follow-up.  Patient had a severe anaphylactic reaction to rituximab needing EpiPen.  Patient transported to the emergency room; and later discharged that day.  Patient complains of mild dyspnea on exertion.  Patient is taking prednisone 100 mg a day for the last 1 week.   Continues to have mild tingling intermittently upper extremities because of carpal tunnel.  Review of Systems  Constitutional:  Positive for malaise/fatigue. Negative for chills, diaphoresis, fever and weight loss.  HENT:  Negative for  nosebleeds and sore throat.   Eyes:  Negative for double vision.  Respiratory:  Positive for cough, shortness of breath and wheezing. Negative for hemoptysis and sputum production.   Cardiovascular:  Negative for chest pain, palpitations, orthopnea and leg swelling.  Gastrointestinal:  Positive for nausea. Negative for abdominal pain, blood in stool, constipation, diarrhea, heartburn and melena.  Genitourinary:  Negative for dysuria, frequency and urgency.  Musculoskeletal:  Positive for myalgias. Negative for back pain and joint pain.  Skin: Negative.  Negative for itching and rash.  Neurological:  Positive for dizziness and tingling. Negative for focal weakness, weakness and headaches.  Endo/Heme/Allergies:  Does not bruise/bleed easily.  Psychiatric/Behavioral:  Negative for depression. The patient has insomnia. The patient is not nervous/anxious.     MEDICAL HISTORY:  Past Medical History:  Diagnosis Date  . Anemia   . Depression     SURGICAL HISTORY: Past Surgical History:  Procedure Laterality Date  . ABDOMINAL HYSTERECTOMY    . COLONOSCOPY WITH PROPOFOL N/A 05/12/2022   Procedure: COLONOSCOPY WITH PROPOFOL;  Surgeon: Midge Minium, MD;  Location: [facility] ENDOSCOPY;  Service: Endoscopy;  Laterality: N/A;  . ESOPHAGOGASTRODUODENOSCOPY (EGD) WITH PROPOFOL N/A 05/12/2022   Procedure: ESOPHAGOGASTRODUODENOSCOPY (EGD) WITH PROPOFOL;  Surgeon: Midge Minium, MD;  Location: [facility] ENDOSCOPY;  Service: Endoscopy;  Laterality: N/A;  . IR BONE MARROW BIOPSY & ASPIRATION  05/28/2022  . TONSILLECTOMY      SOCIAL HISTORY: Social History   Socioeconomic History  . Marital status: Single    Spouse name: Not on file  . Number of children: Not on file  . Years of education: Not on file  . Highest education level: Not on file  Occupational History  . Not on file  Tobacco Use  . Smoking status: Former    Current packs/day: 0.00  Average packs/day: 0.5 packs/day for 25.0 years (12.5 ttl  pk-yrs)    Types: Cigarettes    Start date: 05/09/1997    Quit date: 05/10/2022    Years since quitting: 0.5  . Smokeless tobacco: Never  Vaping Use  . Vaping status: Never Used  Substance and Sexual Activity  . Alcohol use: Not Currently  . Drug use: Never  . Sexual activity: Not on file  Other Topics Concern  . Not on file  Social History Narrative   Lives in [place] with 4 kids; stay at home [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    Social Determinants of Health   Financial Resource Strain: Not on file  Food Insecurity: No Food Insecurity (05/11/2022)   Hunger Vital Sign   . Worried About Programme researcher, broadcasting/film/video in the Last Year: Never true   . Ran Out of Food in the Last Year: Never true  Transportation Needs: Not on file  Physical Activity: Not on file  Stress: Not on file  Social Connections: Not on file  Intimate Partner Violence: Not on file    FAMILY HISTORY: Family History  Problem Relation Age of Onset  . Breast cancer Paternal Aunt     ALLERGIES:  is allergic to rituxan [rituximab].  MEDICATIONS:  Current Outpatient Medications  Medication Sig Dispense Refill  . albuterol (VENTOLIN HFA) 108 (90 Base) MCG/ACT inhaler Inhale 2 puffs into the lungs every 6 (six) hours as needed for wheezing or shortness of breath. 8 g 2  . folic acid (FOLVITE) 1 MG tablet Take 1 tablet (1 mg total) by mouth daily. 90 tablet 1  . gabapentin (NEURONTIN) 100 MG capsule Take 1 capsule by mouth 3 (three) times daily.    . hydrOXYzine (ATARAX) 25 MG tablet Take 1 tablet (25 mg total) by mouth 3 (three) times daily as needed for itching. 30 tablet 0  . meclizine (ANTIVERT) 25 MG tablet Take 1 tablet (25 mg total) by mouth 3 (three) times daily as needed for dizziness. 30 tablet 0  . metFORMIN (GLUCOPHAGE) 500 MG tablet Take 500 mg by mouth 2 (two) times daily with a meal.    . predniSONE (DELTASONE) 20 MG tablet Once a day with food  in AM: Take 5 pills a day for the next 1  week; and then 4 pills a day for the next 2 week-do not stop until further directed. 100 tablet 0  . Semaglutide-Weight Management (WEGOVY) 0.25 MG/0.5ML SOAJ Inject 0.25 mg into the skin once a week. (Patient not taking: Reported on 11/14/2022)    . venlafaxine XR (EFFEXOR-XR) 37.5 MG 24 hr capsule Take 37.5 mg by mouth daily.     No current facility-administered medications for this visit.      PHYSICAL EXAMINATION:   There were no vitals filed for this visit.    There were no vitals filed for this visit.     Physical Exam HENT:     Head: Normocephalic and atraumatic.     Mouth/Throat:     Pharynx: No oropharyngeal exudate.  Eyes:     Pupils: Pupils are equal, round, and reactive to light.  Cardiovascular:     Rate and Rhythm: Normal rate and regular rhythm.  Pulmonary:     Effort: Pulmonary effort is normal. No respiratory distress.     Breath sounds: Normal breath sounds. No wheezing.  Abdominal:     General: Bowel sounds are normal. There is no distension.     Palpations: Abdomen is  soft. There is no mass.     Tenderness: There is no abdominal tenderness. There is no guarding or rebound.  Musculoskeletal:        General: No tenderness. Normal range of motion.     Cervical back: Normal range of motion and neck supple.  Skin:    General: Skin is warm.  Neurological:     Mental Status: She is alert and oriented to person, place, and time.  Psychiatric:        Mood and Affect: Affect normal.    LABORATORY DATA:  I have reviewed the data as listed Lab Results  Component Value Date   WBC 7.1 11/14/2022   HGB 10.8 (L) 11/14/2022   HCT 30.6 (L) 11/14/2022   MCV 110.9 (H) 11/14/2022   PLT 162 11/14/2022   Recent Labs    05/10/22 2030 05/11/22 0901 05/12/22 0458 06/10/22 1013 06/17/22 0959 08/22/22 1011 10/31/22 0900 11/07/22 1242 11/14/22 1031  NA  --  138   < > 139   < > 138 138 137 137  K  --  3.7   < > 3.6   < > 4.3 3.7 3.6 4.0  CL  --  107   < >  103   < > 106 105 102 105  CO2  --  25   < > 26   < > 23 25 22 25   GLUCOSE  --  131*   < > 132*   < > 106* 139* 248* 151*  BUN  --  9   < > 17   < > 14 16 13 15   CREATININE  --  0.53   < > 0.71   < > 0.63 0.58 0.62 0.69  CALCIUM  --  8.7*   < > 9.2   < > 9.1 9.1 8.8* 8.9  GFRNONAA  --  >60   < > >60   < > >60 >60 >60 >60  PROT 6.6 6.3*   < > 6.7  --  7.1 7.1  --   --   ALBUMIN 3.6 3.3*   < > 3.9  --  3.7 4.0  --   --   AST 32 36   < > 21  --  17 18  --   --   ALT 25 25   < > 18  --  13 16  --   --   ALKPHOS 51 53   < > 41  --  62 68  --   --   BILITOT 1.5* 1.9*   < > 0.9  --  0.6 1.2  --   --   BILIDIR 0.2 0.3*  --   --   --   --   --   --   --   IBILI 1.3* 1.6*  --   --   --   --   --   --   --    < > = values in this interval not displayed.     No results found.  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  JAK2 testing negative.  However bone marrow biopsy- [MAY 2024-negative for any myelophthisis process; suggestive of hemolysis] HOLD off metformin; and detromethomorphan- Bupropion. PNH testing-NEGATIVE; complement levels.;  Hemoglobin cascade; G6PD levels-WNL.  April 2024 abdominal ultrasound showed  mild splenomegaly.  October 2024 discontinued rituximab given anaphylactic reaction [Addendum: Patient noted to have acute infusion reaction to rituximab-grade 4 [severe bronchospasm; syncopal episode throat swelling]-symptoms improved after EpiPen injection.  Patient sent over to the emergency room.  Discontinue rituximab. Cancel rituximab infusions.].  # Patient currently on prednisone 100 mg a day x1 week; continue prednisone 100 mg a day for the next 1 week; and the taper to 80 mg/day x2 weeks. If improving/stable-recommend prednisone 80 mg a day for  2 weeks;  and plan continue slow taper. 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  Stable.   # Tingling/ numbness BILATERAL UE- carpal tunnel vs others-status post evaluation with [facility] surgery planned in October/November.  However this might have to wait until patient is stable from hematologic standpoint.  #Smoker- quit April 2024-  # Vaccinations: Flu host [oct 2024].   # DISPOSITION: # follow up in 2 weeks- APP  labs- cbc; reticulocyte count ;BMP;  LDH;  #  follow up in 4 weeks- MD;  labs- cbc; reticulocyte count ;BMP;  LDH; Dr.B     All questions were answered. The patient knows to call the clinic with any problems, questions or concerns.    Earna Coder, MD 12/14/2022 8:13 PM

## 2022-12-15 ENCOUNTER — Inpatient Hospital Stay: Payer: Medicaid Other

## 2022-12-15 ENCOUNTER — Encounter: Payer: Self-pay | Admitting: Internal Medicine

## 2022-12-15 ENCOUNTER — Inpatient Hospital Stay (HOSPITAL_BASED_OUTPATIENT_CLINIC_OR_DEPARTMENT_OTHER): Payer: Medicaid Other | Admitting: Internal Medicine

## 2022-12-15 VITALS — BP 109/63 | HR 89 | Temp 98.0°F | Ht 63.0 in | Wt 227.8 lb

## 2022-12-15 DIAGNOSIS — D472 Monoclonal gammopathy: Secondary | ICD-10-CM | POA: Diagnosis present

## 2022-12-15 DIAGNOSIS — D649 Anemia, unspecified: Secondary | ICD-10-CM

## 2022-12-15 DIAGNOSIS — R161 Splenomegaly, not elsewhere classified: Secondary | ICD-10-CM | POA: Diagnosis not present

## 2022-12-15 DIAGNOSIS — D591 Autoimmune hemolytic anemia, unspecified: Secondary | ICD-10-CM | POA: Diagnosis present

## 2022-12-15 DIAGNOSIS — D509 Iron deficiency anemia, unspecified: Secondary | ICD-10-CM | POA: Diagnosis not present

## 2022-12-15 LAB — BASIC METABOLIC PANEL - CANCER CENTER ONLY
Anion gap: 9 (ref 5–15)
BUN: 11 mg/dL (ref 6–20)
CO2: 27 mmol/L (ref 22–32)
Calcium: 9 mg/dL (ref 8.9–10.3)
Chloride: 102 mmol/L (ref 98–111)
Creatinine: 0.69 mg/dL (ref 0.44–1.00)
GFR, Estimated: >60 mL/min (ref >60)
Glucose, Bld: 109 mg/dL — ABNORMAL HIGH (ref 70–99)
Potassium: 4.1 mmol/L (ref 3.5–5.1)
Sodium: 138 mmol/L (ref 135–145)

## 2022-12-15 LAB — CBC WITH DIFFERENTIAL (CANCER CENTER ONLY)
Abs Immature Granulocytes: 0.02 10*3/uL (ref 0.00–0.07)
Basophils Absolute: 0 10*3/uL (ref 0.0–0.1)
Basophils Relative: 0 %
Eosinophils Absolute: 0.1 10*3/uL (ref 0.0–0.5)
Eosinophils Relative: 2 %
HCT: 31.8 % — ABNORMAL LOW (ref 36.0–46.0)
Hemoglobin: 11.4 g/dL — ABNORMAL LOW (ref 12.0–15.0)
Immature Granulocytes: 0 %
Lymphocytes Relative: 26 %
Lymphs Abs: 1.3 10*3/uL (ref 0.7–4.0)
MCH: 40.9 pg — ABNORMAL HIGH (ref 26.0–34.0)
MCHC: 35.8 g/dL (ref 30.0–36.0)
MCV: 114 fL — ABNORMAL HIGH (ref 80.0–100.0)
Monocytes Absolute: 0.3 10*3/uL (ref 0.1–1.0)
Monocytes Relative: 7 %
Neutro Abs: 3.4 10*3/uL (ref 1.7–7.7)
Neutrophils Relative %: 65 %
Platelet Count: 215 10*3/uL (ref 150–400)
RBC: 2.79 MIL/uL — ABNORMAL LOW (ref 3.87–5.11)
RDW: 15.6 % — ABNORMAL HIGH (ref 11.5–15.5)
WBC Count: 5.2 10*3/uL (ref 4.0–10.5)
nRBC: 0 % (ref 0.0–0.2)

## 2022-12-15 LAB — RETIC PANEL
Immature Retic Fract: 25.3 % — ABNORMAL HIGH (ref 2.3–15.9)
RBC.: 2.78 MIL/uL — ABNORMAL LOW (ref 3.87–5.11)
Retic Count, Absolute: 112.3 10*3/uL (ref 19.0–186.0)
Retic Ct Pct: 4 % — ABNORMAL HIGH (ref 0.4–3.1)
Reticulocyte Hemoglobin: 42.1 pg (ref >27.9)

## 2022-12-15 LAB — LACTATE DEHYDROGENASE: LDH: 211 U/L — ABNORMAL HIGH (ref 98–192)

## 2022-12-15 MED ORDER — PREDNISONE 20 MG PO TABS: ORAL_TABLET | ORAL | 0 refills | Status: DC

## 2022-12-15 NOTE — Patient Instructions (Signed)
#   Take prednisone-40 mg/day [2 pills]-Daily for 1 week; and then take 1 pill a day-do not stop until further directions.

## 2022-12-15 NOTE — Progress Notes (Signed)
Appetite 75% normal, no supplements.

## 2022-12-30 ENCOUNTER — Other Ambulatory Visit: Payer: Medicaid Other

## 2022-12-30 ENCOUNTER — Ambulatory Visit: Payer: Medicaid Other | Admitting: Internal Medicine

## 2023-01-07 ENCOUNTER — Inpatient Hospital Stay (HOSPITAL_BASED_OUTPATIENT_CLINIC_OR_DEPARTMENT_OTHER): Payer: Medicaid Other | Admitting: Internal Medicine

## 2023-01-07 ENCOUNTER — Inpatient Hospital Stay: Payer: Medicaid Other | Attending: Internal Medicine

## 2023-01-07 ENCOUNTER — Encounter: Payer: Self-pay | Admitting: Internal Medicine

## 2023-01-07 DIAGNOSIS — D649 Anemia, unspecified: Secondary | ICD-10-CM

## 2023-01-07 DIAGNOSIS — D509 Iron deficiency anemia, unspecified: Secondary | ICD-10-CM | POA: Diagnosis not present

## 2023-01-07 DIAGNOSIS — D472 Monoclonal gammopathy: Secondary | ICD-10-CM | POA: Diagnosis present

## 2023-01-07 DIAGNOSIS — D591 Autoimmune hemolytic anemia, unspecified: Secondary | ICD-10-CM | POA: Diagnosis present

## 2023-01-07 LAB — CBC WITH DIFFERENTIAL (CANCER CENTER ONLY)
Abs Immature Granulocytes: 0.02 10*3/uL (ref 0.00–0.07)
Basophils Absolute: 0 10*3/uL (ref 0.0–0.1)
Basophils Relative: 0 %
Eosinophils Absolute: 0.1 10*3/uL (ref 0.0–0.5)
Eosinophils Relative: 2 %
HCT: 27.4 % — ABNORMAL LOW (ref 36.0–46.0)
Hemoglobin: 10.1 g/dL — ABNORMAL LOW (ref 12.0–15.0)
Immature Granulocytes: 1 %
Lymphocytes Relative: 28 %
Lymphs Abs: 1.2 10*3/uL (ref 0.7–4.0)
MCH: 42.3 pg — ABNORMAL HIGH (ref 26.0–34.0)
MCHC: 36.9 g/dL — ABNORMAL HIGH (ref 30.0–36.0)
MCV: 114.6 fL — ABNORMAL HIGH (ref 80.0–100.0)
Monocytes Absolute: 0.2 10*3/uL (ref 0.1–1.0)
Monocytes Relative: 4 %
Neutro Abs: 2.8 10*3/uL (ref 1.7–7.7)
Neutrophils Relative %: 65 %
Platelet Count: 190 10*3/uL (ref 150–400)
RBC: 2.39 MIL/uL — ABNORMAL LOW (ref 3.87–5.11)
RDW: 15.9 % — ABNORMAL HIGH (ref 11.5–15.5)
WBC Count: 4.3 10*3/uL (ref 4.0–10.5)
nRBC: 0 % (ref 0.0–0.2)

## 2023-01-07 LAB — BASIC METABOLIC PANEL - CANCER CENTER ONLY
Anion gap: 8 (ref 5–15)
BUN: 14 mg/dL (ref 6–20)
CO2: 24 mmol/L (ref 22–32)
Calcium: 9.1 mg/dL (ref 8.9–10.3)
Chloride: 106 mmol/L (ref 98–111)
Creatinine: 0.63 mg/dL (ref 0.44–1.00)
GFR, Estimated: >60 mL/min (ref >60)
Glucose, Bld: 106 mg/dL — ABNORMAL HIGH (ref 70–99)
Potassium: 3.9 mmol/L (ref 3.5–5.1)
Sodium: 138 mmol/L (ref 135–145)

## 2023-01-07 LAB — RETIC PANEL
Immature Retic Fract: 25.4 % — ABNORMAL HIGH (ref 2.3–15.9)
RBC.: 2.37 MIL/uL — ABNORMAL LOW (ref 3.87–5.11)
Retic Count, Absolute: 99.1 10*3/uL (ref 19.0–186.0)
Retic Ct Pct: 4.2 % — ABNORMAL HIGH (ref 0.4–3.1)
Reticulocyte Hemoglobin: 43.8 pg (ref >27.9)

## 2023-01-07 LAB — LACTATE DEHYDROGENASE: LDH: 296 U/L — ABNORMAL HIGH (ref 98–192)

## 2023-01-07 NOTE — Progress Notes (Signed)
Fatigue/weakness: yes Dyspena: no Light headedness: yes Blood in stool: no  Having surgery on right hand, carpal tunnel this Friday.  C/o ongoing blurred vision.

## 2023-01-07 NOTE — Assessment & Plan Note (Addendum)
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  JAK2 testing negative.  However bone marrow biopsy- [MAY 2024-negative for any myelophthisis process; suggestive of hemolysis] HOLD off metformin; and detromethomorphan- Bupropion. PNH testing-NEGATIVE; complement levels.;  Hemoglobin cascade; G6PD levels-WNL.  April 2024 abdominal ultrasound showed mild splenomegaly.  October 2024 discontinued rituximab given anaphylactic reaction [Addendum: Patient noted to have acute infusion reaction to rituximab-grade 4 [severe bronchospasm; syncopal episode throat swelling]-symptoms improved after EpiPen injection.  Patient sent over to the emergency room.  Discontinue rituximab.  # continue current dose of prednsone 20 mg/ 1 pill a day-do not stop until further directions. Discussed re: adding Oral Immunosupressant like mycophenolate as hemoglobin not improving significantly]-today hemoglobin is 10.3.also discussed with pharmacy- add 500 mg BID.   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  Stable.   # Tingling/ numbness BILATERAL UE- carpal tunnel vs others-status post evaluation with [facility] surgery planned in  on dec 13th. Planned.   # Vaccinations: Flu host [oct 2024].   # DISPOSITION: #  follow up in 4  weeks- MD;  labs- cbc; reticulocyte count ;BMP;  LDH; Dr.B

## 2023-01-07 NOTE — Progress Notes (Signed)
[facility] CONSULT NOTE  Patient Care Team: Armando Gang, FNP as PCP - General (Family Medicine) Earna Coder, MD as Consulting Physician (Oncology)  CHIEF COMPLAINTS/PURPOSE OF CONSULTATION: ANEMIA  HEMATOLOGY HISTORY  # IRON DEFICIENCY ANEMIA CHRONIC [since 2019] AUG 2021- hb-9; MCV- 63; Iron sat- 3%; EGD > 7 years ago [[place]]; colonoscopy/capsule-NA; PO iron constipates.   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  JAK2 testing negative.  However bone marrow biopsy- [MAY 2024-negative for any myelophthisis process; suggestive of hemolysis] HOLD off metformin; and detromethomorphan- Bupropion. PNH testing-NEGATIVE; complement levels.;  Hemoglobin cascade; G6PD levels-WNL.  April 2024 abdominal ultrasound showed mild splenomegaly.    # 11/07/2022-RELAPSE-start  Rituximab X1 [OCT 11th, 2024- ]G-4-anaphylactic reaction needing EpiPen transporting to the emergency room.-Discontinue further rituximab.  #History of heavy menstrual cycles  HISTORY OF PRESENTING ILLNESS: Alone. Ambulating independently.  Danielle [name] 47 y.o.  female with relapsed autoimmune hemolytic anemia steroid responsive -but DAT negative [ [suspect IgA] -is here for follow-up.  Most recently on prednisone  20 mg mg/day for last 3-4 weeks.   Continues to have mild tingling intermittently upper extremities because of carpal tunnel.  Review of Systems  Constitutional:  Positive for malaise/fatigue. Negative for chills, diaphoresis, fever and weight loss.  HENT:  Negative for nosebleeds and sore throat.   Eyes:  Negative for double vision.  Respiratory:  Negative for hemoptysis and sputum production.   Cardiovascular:  Negative for chest pain, palpitations, orthopnea  and leg swelling.  Gastrointestinal:  Negative for abdominal pain, blood in stool, constipation, diarrhea, heartburn and melena.  Genitourinary:  Negative for dysuria, frequency and urgency.  Musculoskeletal:  Negative for back pain and joint pain.  Skin: Negative.  Negative for itching and rash.  Neurological:  Positive for tingling. Negative for focal weakness, weakness and headaches.  Endo/Heme/Allergies:  Does not bruise/bleed easily.  Psychiatric/Behavioral:  Negative for depression. The patient has insomnia. The patient is not nervous/anxious.     MEDICAL HISTORY:  Past Medical History:  Diagnosis Date   Anemia    Depression     SURGICAL HISTORY: Past Surgical History:  Procedure Laterality Date   ABDOMINAL HYSTERECTOMY     COLONOSCOPY WITH PROPOFOL N/A 05/12/2022   Procedure: COLONOSCOPY WITH PROPOFOL;  Surgeon: Midge Minium, MD;  Location: [facility] ENDOSCOPY;  Service: Endoscopy;  Laterality: N/A;   ESOPHAGOGASTRODUODENOSCOPY (EGD) WITH PROPOFOL N/A 05/12/2022   Procedure: ESOPHAGOGASTRODUODENOSCOPY (EGD) WITH PROPOFOL;  Surgeon: Midge Minium, MD;  Location[facility] ENDOSCOPY;  Service: Endoscopy;  Laterality: N/A;   IR BONE MARROW BIOPSY & ASPIRATION  05/28/2022   TONSILLECTOMY      SOCIAL HISTORY: Social History   Socioeconomic History   Marital status: Single    Spouse name: Not on file   Number of children: Not on file   Years of education: Not on file   Highest education level: Not on file  Occupational History   Not on file  Tobacco Use   Smoking status: Former    Current packs/day: 0.00    Average packs/day: 0.5 packs/day for 25.0 years (12.5 ttl pk-yrs)    Types: Cigarettes    Start date: 05/09/1997    Quit date: 05/10/2022    Years since quitting: 0.6   Smokeless tobacco: Never  Vaping Use   Vaping status: Never Used  Substance and Sexual Activity   Alcohol use: Not Currently   Drug use: Never   Sexual activity: Not on file  Other Topics Concern   Not on  file  Social History Narrative   Lives in [place] with 4 kids; stay at home [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    Social Determinants of Health   Financial Resource Strain: Not on file  Food Insecurity: No Food Insecurity (05/11/2022)   Hunger Vital Sign    Worried About Running Out of Food in the Last Year: Never true    Ran Out of Food in the Last Year: Never true  Transportation Needs: Not on file  Physical Activity: Not on file  Stress: Not on file  Social Connections: Not on file  Intimate Partner Violence: Not on file    FAMILY HISTORY: Family History  Problem Relation Age of Onset   Breast cancer Paternal Aunt     ALLERGIES:  is allergic to rituxan [rituximab].  MEDICATIONS:  Current Outpatient Medications  Medication Sig Dispense Refill   albuterol (VENTOLIN HFA) 108 (90 Base) MCG/ACT inhaler Inhale 2 puffs into the lungs every 6 (six) hours as needed for wheezing or shortness of breath. 8 g 2   folic acid (FOLVITE) 1 MG tablet Take 1 tablet (1 mg total) by mouth daily. 90 tablet 1   gabapentin (NEURONTIN) 100 MG capsule Take 1 capsule by mouth 3 (three) times daily.     hydrOXYzine (ATARAX) 25 MG tablet Take 1 tablet (25 mg total) by mouth 3 (three) times daily as needed for itching. 30 tablet 0   meclizine (ANTIVERT) 25 MG tablet Take 1 tablet (25 mg total) by mouth 3 (three) times daily as needed for dizziness. 30 tablet 0   metFORMIN (GLUCOPHAGE) 500 MG tablet Take 500 mg by mouth 2 (two) times daily with a meal.     predniSONE (DELTASONE) 20 MG tablet # Take prednisone-40 mg/day [2 pills]-Daily for 1 week; and then take 1 pill a day-do not stop until further directions. 60 tablet 0   Semaglutide-Weight Management (WEGOVY) 0.25 MG/0.5ML SOAJ Inject 0.25 mg into the skin once a week.     venlafaxine XR (EFFEXOR-XR) 37.5 MG 24 hr capsule Take 37.5 mg by mouth daily.     No current facility-administered medications for this visit.      PHYSICAL  EXAMINATION:   Vitals:   01/07/23 0934  BP: 121/64  Pulse: 94  Temp: 98.2 F (36.8 C)  SpO2: 100%       Filed Weights   01/07/23 0934  Weight: 225 lb 12.8 oz (102.4 kg)        Physical Exam HENT:     Head: Normocephalic and atraumatic.     Mouth/Throat:     Pharynx: No oropharyngeal exudate.  Eyes:     Pupils: Pupils are equal, round, and reactive to light.  Cardiovascular:     Rate and Rhythm: Normal rate and regular rhythm.  Pulmonary:     Effort: Pulmonary effort is normal. No respiratory distress.     Breath sounds: Normal breath sounds. No wheezing.  Abdominal:     General: Bowel sounds are normal. There is no distension.     Palpations: Abdomen is soft. There is no mass.     Tenderness: There is no abdominal tenderness. There is no guarding or rebound.  Musculoskeletal:        General: No tenderness. Normal range of motion.     Cervical back: Normal  range of motion and neck supple.  Skin:    General: Skin is warm.  Neurological:     Mental Status: She is alert and oriented to person, place, and time.  Psychiatric:        Mood and Affect: Affect normal.     LABORATORY DATA:  I have reviewed the data as listed Lab Results  Component Value Date   WBC 4.3 01/07/2023   HGB 10.1 (L) 01/07/2023   HCT 27.4 (L) 01/07/2023   MCV 114.6 (H) 01/07/2023   PLT 190 01/07/2023   Recent Labs    05/10/22 2030 05/11/22 0901 05/12/22 0458 06/10/22 1013 06/17/22 0959 08/22/22 1011 10/31/22 0900 11/07/22 1242 11/14/22 1031 12/15/22 0918 01/07/23 0943  NA  --  138   < > 139   < > 138 138   < > 137 138 138  K  --  3.7   < > 3.6   < > 4.3 3.7   < > 4.0 4.1 3.9  CL  --  107   < > 103   < > 106 105   < > 105 102 106  CO2  --  25   < > 26   < > 23 25   < > 25 27 24   GLUCOSE  --  131*   < > 132*   < > 106* 139*   < > 151* 109* 106*  BUN  --  9   < > 17   < > 14 16   < > 15 11 14   CREATININE  --  0.53   < > 0.71   < > 0.63 0.58   < > 0.69 0.69 0.63  CALCIUM   --  8.7*   < > 9.2   < > 9.1 9.1   < > 8.9 9.0 9.1  GFRNONAA  --  >60   < > >60   < > >60 >60   < > >60 >60 >60  PROT 6.6 6.3*   < > 6.7  --  7.1 7.1  --   --   --   --   ALBUMIN 3.6 3.3*   < > 3.9  --  3.7 4.0  --   --   --   --   AST 32 36   < > 21  --  17 18  --   --   --   --   ALT 25 25   < > 18  --  13 16  --   --   --   --   ALKPHOS 51 53   < > 41  --  62 68  --   --   --   --   BILITOT 1.5* 1.9*   < > 0.9  --  0.6 1.2  --   --   --   --   BILIDIR 0.2 0.3*  --   --   --   --   --   --   --   --   --   IBILI 1.3* 1.6*  --   --   --   --   --   --   --   --   --    < > = values in this interval not displayed.     No results found.  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  # Based on the peripheral smear concerning for  myelophthisis process [nucleated RBC schistocytes and teardrop]-no clinical concerns of any TTP or HUS/Maha syndrome.  JAK2 testing negative.  However bone marrow biopsy- [MAY 2024-negative for any myelophthisis process; suggestive of hemolysis] HOLD off metformin; and detromethomorphan- Bupropion. PNH testing-NEGATIVE; complement levels.;  Hemoglobin cascade; G6PD levels-WNL.  April 2024 abdominal ultrasound showed mild splenomegaly.  October 2024 discontinued rituximab given anaphylactic reaction [Addendum: Patient noted to have acute infusion reaction to rituximab-grade 4 [severe bronchospasm; syncopal episode throat swelling]-symptoms improved after EpiPen injection.  Patient sent over to the emergency room.  Discontinue rituximab.  # continue current dose of prednsone 20 mg/ 1 pill a day-do not stop until further directions. Discussed re: adding Oral Immunosupressant like mycophenolate as hemoglobin not improving significantly]-today hemoglobin is 10.3.also discussed with pharmacy- add 500 mg BID.   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  Stable.   # Tingling/  numbness BILATERAL UE- carpal tunnel vs others-status post evaluation with [facility] surgery planned in  on dec 13th. Planned.   # Vaccinations: Flu host [oct 2024].   # DISPOSITION: #  follow up in 4  weeks- MD;  labs- cbc; reticulocyte count ;BMP;  LDH; Dr.B   All questions were answered. The patient knows to call the clinic with any problems, questions or concerns.    Earna Coder, MD 01/07/2023 10:35 AM

## 2023-02-04 ENCOUNTER — Inpatient Hospital Stay (HOSPITAL_BASED_OUTPATIENT_CLINIC_OR_DEPARTMENT_OTHER): Payer: Medicaid Other | Admitting: Internal Medicine

## 2023-02-04 ENCOUNTER — Telehealth: Payer: Self-pay | Admitting: *Deleted

## 2023-02-04 ENCOUNTER — Encounter: Payer: Self-pay | Admitting: Internal Medicine

## 2023-02-04 ENCOUNTER — Other Ambulatory Visit (HOSPITAL_COMMUNITY): Payer: Self-pay

## 2023-02-04 ENCOUNTER — Inpatient Hospital Stay: Payer: Medicaid Other | Attending: Internal Medicine

## 2023-02-04 DIAGNOSIS — D649 Anemia, unspecified: Secondary | ICD-10-CM

## 2023-02-04 DIAGNOSIS — D472 Monoclonal gammopathy: Secondary | ICD-10-CM | POA: Diagnosis present

## 2023-02-04 DIAGNOSIS — D591 Autoimmune hemolytic anemia, unspecified: Secondary | ICD-10-CM | POA: Diagnosis present

## 2023-02-04 LAB — RETIC PANEL
Immature Retic Fract: 27.2 % — ABNORMAL HIGH (ref 2.3–15.9)
RBC.: 2.4 MIL/uL — ABNORMAL LOW (ref 3.87–5.11)
Retic Count, Absolute: 94.1 10*3/uL (ref 19.0–186.0)
Retic Ct Pct: 3.9 % — ABNORMAL HIGH (ref 0.4–3.1)
Reticulocyte Hemoglobin: 43.6 pg (ref >27.9)

## 2023-02-04 LAB — BASIC METABOLIC PANEL - CANCER CENTER ONLY
Anion gap: 8 (ref 5–15)
BUN: 10 mg/dL (ref 6–20)
CO2: 27 mmol/L (ref 22–32)
Calcium: 9.2 mg/dL (ref 8.9–10.3)
Chloride: 103 mmol/L (ref 98–111)
Creatinine: 0.57 mg/dL (ref 0.44–1.00)
GFR, Estimated: >60 mL/min (ref >60)
Glucose, Bld: 106 mg/dL — ABNORMAL HIGH (ref 70–99)
Potassium: 3.9 mmol/L (ref 3.5–5.1)
Sodium: 138 mmol/L (ref 135–145)

## 2023-02-04 LAB — CBC WITH DIFFERENTIAL (CANCER CENTER ONLY)
Abs Immature Granulocytes: 0.01 10*3/uL (ref 0.00–0.07)
Basophils Absolute: 0 10*3/uL (ref 0.0–0.1)
Basophils Relative: 0 %
Eosinophils Absolute: 0.1 10*3/uL (ref 0.0–0.5)
Eosinophils Relative: 3 %
HCT: 28.5 % — ABNORMAL LOW (ref 36.0–46.0)
Hemoglobin: 10.3 g/dL — ABNORMAL LOW (ref 12.0–15.0)
Immature Granulocytes: 0 %
Lymphocytes Relative: 27 %
Lymphs Abs: 1.3 10*3/uL (ref 0.7–4.0)
MCH: 42.4 pg — ABNORMAL HIGH (ref 26.0–34.0)
MCHC: 36.1 g/dL — ABNORMAL HIGH (ref 30.0–36.0)
MCV: 117.3 fL — ABNORMAL HIGH (ref 80.0–100.0)
Monocytes Absolute: 0.2 10*3/uL (ref 0.1–1.0)
Monocytes Relative: 5 %
Neutro Abs: 3.2 10*3/uL (ref 1.7–7.7)
Neutrophils Relative %: 65 %
Platelet Count: 229 10*3/uL (ref 150–400)
RBC: 2.43 MIL/uL — ABNORMAL LOW (ref 3.87–5.11)
RDW: 15.6 % — ABNORMAL HIGH (ref 11.5–15.5)
WBC Count: 4.8 10*3/uL (ref 4.0–10.5)
nRBC: 0 % (ref 0.0–0.2)

## 2023-02-04 LAB — LACTATE DEHYDROGENASE: LDH: 327 U/L — ABNORMAL HIGH (ref 98–192)

## 2023-02-04 MED ORDER — MYCOPHENOLATE MOFETIL 500 MG PO TABS: 500.0000 mg | ORAL_TABLET | Freq: Two times a day (BID) | ORAL | 3 refills | Status: DC

## 2023-02-04 NOTE — Telephone Encounter (Signed)
 Phone call to patient;Informed patient to start mycophenolate- in 1 week; making sure the infection has gotten better. Patient was told prescription has been called in to her regular pharmacy. She understands above.

## 2023-02-04 NOTE — Assessment & Plan Note (Addendum)
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  JAK2 testing negative.  However bone marrow biopsy- [MAY 2024-negative for any myelophthisis process; suggestive of hemolysis] HOLD off metformin; and detromethomorphan- Bupropion. PNH testing-NEGATIVE; complement levels.;  Hemoglobin cascade; G6PD levels-WNL.  April 2024 abdominal ultrasound showed mild splenomegaly.  October 2024 discontinued rituximab  given anaphylactic reaction [Addendum: Patient noted to have acute infusion reaction to rituximab -grade 4 [severe bronchospasm; syncopal episode throat swelling]-symptoms improved after EpiPen  injection.  Patient sent over to the emergency room.  Discontinue rituximab .  # decrease the dose of prednsone 10 mg/ 1 pill a day-do not stop until further directions [given rising LDH; overall stable hemoglobin.]. Discussed re: adding Oral Immunosupressant like mycophenolate  as hemoglobin not improving significantly]-today hemoglobin is 10.3.also discussed with pharmacy- add 500 mg BID; script sent. Patient to start mycophenolate - in 1 week- making sure the infection has gotten better.   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  Stable.   # Tingling/ numbness BILATERAL UE-s/p Right carpal tunnel- surgery   on dec 13th-infected as per patient currently on antibiotics.  Will taper steroids/also start immunosuppressant after infection resolves.  # Vaccinations: Flu host [oct 2024].   # DISPOSITION: #  follow up in 4  weeks- MD;  labs- cbc; reticulocyte count ;BMP;  LDH; Dr.B

## 2023-02-04 NOTE — Progress Notes (Signed)
 Carpal tunnel wound on R hand is swollen, infected and painful.Has appt with surgeon tomm. She is disappointed and regrets having the surgery. She feels weak, short of breath.

## 2023-02-04 NOTE — Progress Notes (Signed)
 [facility] CONSULT NOTE  Patient Care Team: Donal Channing SQUIBB, FNP as PCP - General (Family Medicine) Rennie Cindy SAUNDERS, MD as Consulting Physician (Oncology)  CHIEF COMPLAINTS/PURPOSE OF CONSULTATION: ANEMIA  HEMATOLOGY HISTORY  # IRON  DEFICIENCY ANEMIA CHRONIC [since 2019] AUG 2021- hb-9; MCV- 63; Iron  sat- 3%; EGD > 7 years ago [[place]]; colonoscopy/capsule-NA; PO iron  constipates.   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  JAK2 testing negative.  However bone marrow biopsy- [MAY 2024-negative for any myelophthisis process; suggestive of hemolysis] HOLD off metformin; and detromethomorphan- Bupropion. PNH testing-NEGATIVE; complement levels.;  Hemoglobin cascade; G6PD levels-WNL.  April 2024 abdominal ultrasound showed mild splenomegaly.    # 11/07/2022-RELAPSE-start  Rituximab  X1 [OCT 11th, 2024- ]G-4-anaphylactic reaction needing EpiPen  transporting to the emergency room.-Discontinue further rituximab .  #History of heavy menstrual cycles  HISTORY OF PRESENTING ILLNESS: Alone. Ambulating independently.  Danielle [name] 48 y.o.  female with relapsed autoimmune hemolytic anemia steroid responsive -but DAT negative [ [suspect IgA] -is here for follow-up.  Patient had carpal tunnel wound on R hand is swollen, infected and painful. Currently on anti-biotics. Patient has appt with surgeon tomm.   Patient continues to feels weak, short of breath.    Review of Systems  Constitutional:  Positive for malaise/fatigue. Negative for chills, diaphoresis, fever and weight loss.  HENT:  Negative for nosebleeds and sore throat.   Eyes:  Negative for double vision.  Respiratory:  Negative for hemoptysis and sputum production.   Cardiovascular:  Negative  for chest pain, palpitations, orthopnea and leg swelling.  Gastrointestinal:  Negative for abdominal pain, blood in stool, constipation, diarrhea, heartburn and melena.  Genitourinary:  Negative for dysuria, frequency and urgency.  Musculoskeletal:  Negative for back pain and joint pain.  Skin: Negative.  Negative for itching and rash.  Neurological:  Positive for tingling. Negative for focal weakness, weakness and headaches.  Endo/Heme/Allergies:  Does not bruise/bleed easily.  Psychiatric/Behavioral:  Negative for depression. The patient has insomnia. The patient is not nervous/anxious.     MEDICAL HISTORY:  Past Medical History:  Diagnosis Date   Anemia    Depression     SURGICAL HISTORY: Past Surgical History:  Procedure Laterality Date   ABDOMINAL HYSTERECTOMY     COLONOSCOPY WITH PROPOFOL  N/A 05/12/2022   Procedure: COLONOSCOPY WITH PROPOFOL ;  Surgeon: Jinny Carmine, MD;  Location: [facility] ENDOSCOPY;  Service: Endoscopy;  Laterality: N/A;   ESOPHAGOGASTRODUODENOSCOPY (EGD) WITH PROPOFOL  N/A 05/12/2022   Procedure: ESOPHAGOGASTRODUODENOSCOPY (EGD) WITH PROPOFOL ;  Surgeon: Jinny Carmine, MD;  Location: [facility] ENDOSCOPY;  Service: Endoscopy;  Laterality: N/A;   IR BONE MARROW BIOPSY & ASPIRATION  05/28/2022   TONSILLECTOMY      SOCIAL HISTORY: Social History   Socioeconomic History   Marital status: Single    Spouse name: Not on file   Number of children: Not on file   Years of education: Not on file   Highest education level: Not on file  Occupational History   Not on file  Tobacco Use   Smoking status: Former    Current packs/day: 0.00    Average packs/day: 0.5 packs/day for 25.0 years (12.5 ttl pk-yrs)    Types: Cigarettes    Start date: 05/09/1997    Quit date: 05/10/2022    Years since quitting: 0.7   Smokeless tobacco: Never  Vaping Use   Vaping status: Never Used  Substance and Sexual Activity   Alcohol use: Not Currently   Drug use: Never   Sexual activity: Not on  file  Other Topics Concern   Not on file  Social History Narrative   Lives in [place] with 4 kids; stay at home [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    Social Drivers of Corporate Investment Banker Strain: Not on file  Food Insecurity: No Food Insecurity (05/11/2022)   Hunger Vital Sign    Worried About Running Out of Food in the Last Year: Never true    Ran Out of Food in the Last Year: Never true  Transportation Needs: Not on file  Physical Activity: Not on file  Stress: Not on file  Social Connections: Not on file  Intimate Partner Violence: Not on file    FAMILY HISTORY: Family History  Problem Relation Age of Onset   Breast cancer Paternal Aunt     ALLERGIES:  is allergic to rituxan  [rituximab ].  MEDICATIONS:  Current Outpatient Medications  Medication Sig Dispense Refill   albuterol  (VENTOLIN  HFA) 108 (90 Base) MCG/ACT inhaler Inhale 2 puffs into the lungs every 6 (six) hours as needed for wheezing or shortness of breath. 8 g 2   doxycycline (VIBRA-TABS) 100 MG tablet 100 mg daily.     folic acid  (FOLVITE ) 1 MG tablet Take 1 tablet (1 mg total) by mouth daily. 90 tablet 1   gabapentin (NEURONTIN) 100 MG capsule Take 1 capsule by mouth 3 (three) times daily.     hydrOXYzine  (ATARAX ) 25 MG tablet Take 1 tablet (25 mg total) by mouth 3 (three) times daily as needed for itching. 30 tablet 0   meclizine  (ANTIVERT ) 25 MG tablet Take 1 tablet (25 mg total) by mouth 3 (three) times daily as needed for dizziness. 30 tablet 0   metFORMIN (GLUCOPHAGE) 500 MG tablet Take 500 mg by mouth 2 (two) times daily with a meal.     mycophenolate  (CELLCEPT ) 500 MG tablet Take 1 tablet (500 mg total) by mouth 2 (two) times daily. 60 tablet 3   predniSONE  (DELTASONE ) 20 MG tablet # Take prednisone -40 mg/day [2 pills]-Daily for 1 week; and then take 1 pill a day-do not stop until further directions. 60 tablet 0   Semaglutide-Weight Management (WEGOVY) 0.25 MG/0.5ML SOAJ Inject 0.25  mg into the skin once a week.     venlafaxine XR (EFFEXOR-XR) 37.5 MG 24 hr capsule Take 37.5 mg by mouth daily.     No current facility-administered medications for this visit.      PHYSICAL EXAMINATION:   Vitals:   02/04/23 1042  BP: 111/72  Pulse: 79  Resp: 16  Temp: (!) 97.3 F (36.3 C)  SpO2: 100%       Filed Weights   02/04/23 1042  Weight: 228 lb (103.4 kg)        Physical Exam HENT:     Head: Normocephalic and atraumatic.     Mouth/Throat:     Pharynx: No oropharyngeal exudate.  Eyes:     Pupils: Pupils are equal, round, and reactive to light.  Cardiovascular:     Rate and Rhythm: Normal rate and regular rhythm.  Pulmonary:     Effort: Pulmonary effort is normal. No respiratory distress.     Breath sounds: Normal breath sounds. No wheezing.  Abdominal:     General: Bowel sounds are normal. There is no distension.     Palpations: Abdomen  is soft. There is no mass.     Tenderness: There is no abdominal tenderness. There is no guarding or rebound.  Musculoskeletal:        General: No tenderness. Normal range of motion.     Cervical back: Normal range of motion and neck supple.  Skin:    General: Skin is warm.  Neurological:     Mental Status: She is alert and oriented to person, place, and time.  Psychiatric:        Mood and Affect: Affect normal.     LABORATORY DATA:  I have reviewed the data as listed Lab Results  Component Value Date   WBC 4.8 02/04/2023   HGB 10.3 (L) 02/04/2023   HCT 28.5 (L) 02/04/2023   MCV 117.3 (H) 02/04/2023   PLT 229 02/04/2023   Recent Labs    05/10/22 2030 05/11/22 0901 05/12/22 0458 06/10/22 1013 06/17/22 0959 08/22/22 1011 10/31/22 0900 11/07/22 1242 12/15/22 0918 01/07/23 0943 02/04/23 1031  NA  --  138   < > 139   < > 138 138   < > 138 138 138  K  --  3.7   < > 3.6   < > 4.3 3.7   < > 4.1 3.9 3.9  CL  --  107   < > 103   < > 106 105   < > 102 106 103  CO2  --  25   < > 26   < > 23 25   < > 27  24 27   GLUCOSE  --  131*   < > 132*   < > 106* 139*   < > 109* 106* 106*  BUN  --  9   < > 17   < > 14 16   < > 11 14 10   CREATININE  --  0.53   < > 0.71   < > 0.63 0.58   < > 0.69 0.63 0.57  CALCIUM   --  8.7*   < > 9.2   < > 9.1 9.1   < > 9.0 9.1 9.2  GFRNONAA  --  >60   < > >60   < > >60 >60   < > >60 >60 >60  PROT 6.6 6.3*   < > 6.7  --  7.1 7.1  --   --   --   --   ALBUMIN 3.6 3.3*   < > 3.9  --  3.7 4.0  --   --   --   --   AST 32 36   < > 21  --  17 18  --   --   --   --   ALT 25 25   < > 18  --  13 16  --   --   --   --   ALKPHOS 51 53   < > 41  --  62 68  --   --   --   --   BILITOT 1.5* 1.9*   < > 0.9  --  0.6 1.2  --   --   --   --   BILIDIR 0.2 0.3*  --   --   --   --   --   --   --   --   --   IBILI 1.3* 1.6*  --   --   --   --   --   --   --   --   --    < > =  values in this interval not displayed.     No results found.  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  JAK2 testing negative.  However bone marrow biopsy- [MAY 2024-negative for any myelophthisis process; suggestive of hemolysis] HOLD off metformin; and detromethomorphan- Bupropion. PNH testing-NEGATIVE; complement levels.;  Hemoglobin cascade; G6PD levels-WNL.  April 2024 abdominal ultrasound showed mild splenomegaly.  October 2024 discontinued rituximab  given anaphylactic reaction [Addendum: Patient noted to have acute infusion reaction to rituximab -grade 4 [severe bronchospasm; syncopal episode throat swelling]-symptoms improved after EpiPen  injection.  Patient sent over to the emergency room.  Discontinue rituximab .  # decrease the dose of prednsone 10 mg/ 1 pill a day-do not stop until further directions [given rising LDH; overall stable hemoglobin.]. Discussed re: adding Oral  Immunosupressant like mycophenolate  as hemoglobin not improving significantly]-today hemoglobin is 10.3.also discussed with pharmacy- add 500 mg BID; script sent. Patient to start mycophenolate - in 1 week- making sure the infection has gotten better.   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  Stable.   # Tingling/ numbness BILATERAL UE-s/p Right carpal tunnel- surgery   on dec 13th-infected as per patient currently on antibiotics.  Will taper steroids/also start immunosuppressant after infection resolves.  # Vaccinations: Flu host [oct 2024].   # DISPOSITION: #  follow up in 4  weeks- MD;  labs- cbc; reticulocyte count ;BMP;  LDH; Dr.B   All questions were answered. The patient knows to call the clinic with any problems, questions or concerns.    Cindy JONELLE Joe, MD 02/04/2023 12:29 PM

## 2023-03-04 ENCOUNTER — Inpatient Hospital Stay: Payer: Medicaid Other

## 2023-03-04 ENCOUNTER — Encounter: Payer: Self-pay | Admitting: Internal Medicine

## 2023-03-04 ENCOUNTER — Inpatient Hospital Stay: Payer: Medicaid Other | Attending: Internal Medicine | Admitting: Internal Medicine

## 2023-03-04 VITALS — BP 104/64 | HR 88 | Temp 96.9°F | Ht 63.0 in | Wt 223.0 lb

## 2023-03-04 DIAGNOSIS — D649 Anemia, unspecified: Secondary | ICD-10-CM | POA: Diagnosis not present

## 2023-03-04 DIAGNOSIS — D591 Autoimmune hemolytic anemia, unspecified: Secondary | ICD-10-CM | POA: Diagnosis present

## 2023-03-04 DIAGNOSIS — D472 Monoclonal gammopathy: Secondary | ICD-10-CM | POA: Insufficient documentation

## 2023-03-04 LAB — CBC WITH DIFFERENTIAL (CANCER CENTER ONLY)
Abs Immature Granulocytes: 0.01 10*3/uL (ref 0.00–0.07)
Basophils Absolute: 0 10*3/uL (ref 0.0–0.1)
Basophils Relative: 0 %
Eosinophils Absolute: 0.1 10*3/uL (ref 0.0–0.5)
Eosinophils Relative: 2 %
HCT: 28 % — ABNORMAL LOW (ref 36.0–46.0)
Hemoglobin: 10.1 g/dL — ABNORMAL LOW (ref 12.0–15.0)
Immature Granulocytes: 0 %
Lymphocytes Relative: 33 %
Lymphs Abs: 1 10*3/uL (ref 0.7–4.0)
MCH: 42.4 pg — ABNORMAL HIGH (ref 26.0–34.0)
MCHC: 36.1 g/dL — ABNORMAL HIGH (ref 30.0–36.0)
MCV: 117.6 fL — ABNORMAL HIGH (ref 80.0–100.0)
Monocytes Absolute: 0.1 10*3/uL (ref 0.1–1.0)
Monocytes Relative: 4 %
Neutro Abs: 1.8 10*3/uL (ref 1.7–7.7)
Neutrophils Relative %: 61 %
Platelet Count: 201 10*3/uL (ref 150–400)
RBC: 2.38 MIL/uL — ABNORMAL LOW (ref 3.87–5.11)
RDW: 15.7 % — ABNORMAL HIGH (ref 11.5–15.5)
WBC Count: 3.1 10*3/uL — ABNORMAL LOW (ref 4.0–10.5)
nRBC: 0 % (ref 0.0–0.2)

## 2023-03-04 LAB — BASIC METABOLIC PANEL - CANCER CENTER ONLY
Anion gap: 8 (ref 5–15)
BUN: 11 mg/dL (ref 6–20)
CO2: 26 mmol/L (ref 22–32)
Calcium: 8.9 mg/dL (ref 8.9–10.3)
Chloride: 104 mmol/L (ref 98–111)
Creatinine: 0.66 mg/dL (ref 0.44–1.00)
GFR, Estimated: >60 mL/min (ref >60)
Glucose, Bld: 102 mg/dL — ABNORMAL HIGH (ref 70–99)
Potassium: 4.3 mmol/L (ref 3.5–5.1)
Sodium: 138 mmol/L (ref 135–145)

## 2023-03-04 LAB — RETIC PANEL
Immature Retic Fract: 19 % — ABNORMAL HIGH (ref 2.3–15.9)
RBC.: 2.38 MIL/uL — ABNORMAL LOW (ref 3.87–5.11)
Retic Count, Absolute: 94 10*3/uL (ref 19.0–186.0)
Retic Ct Pct: 4 % — ABNORMAL HIGH (ref 0.4–3.1)
Reticulocyte Hemoglobin: 43.3 pg (ref >27.9)

## 2023-03-04 LAB — LACTATE DEHYDROGENASE: LDH: 310 U/L — ABNORMAL HIGH (ref 98–192)

## 2023-03-04 NOTE — Assessment & Plan Note (Addendum)
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  # Patient currently OFF prednisone  [ stopped/tapered OFF 10 mg a day]. Also on mycophenolate  500 mg twice daily-overall hemoglobin stable for the last 1 month.   # Today hemoglobin is 10.3-monitor for now.  If hemoglobin not improving or dropping recommend increasing the dose of mycophenolate  at next visit.  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  Stable.   # Tingling/ numbness BILATERAL UE-s/p Right carpal tunnel- surgery   on Dec 13th, 2024- s/p -infected as per patient currently on antibiotics- resolved.   # Nausea/ reguargiation- ? Sec  to wegowy-recommend follow-up with PCP  # Hemorroidal bleeding/constipation- continue miralax .   # Vaccinations: Flu host [oct 2024].   # DISPOSITION: #  follow up in 4  weeks- MD;  labs- cbc; reticulocyte count ;BMP;  LDH; Dr.B

## 2023-03-04 NOTE — Progress Notes (Signed)
 Fatigue/weakness: yes Dyspena: no  Light headedness: no  Blood in stool: yes, bright red x2-3 weeks  Pt c/o of food coming back up when she burps x1 month.

## 2023-03-04 NOTE — Progress Notes (Signed)
 [name] CONSULT NOTE  Patient Care Team: Danielle Channing SQUIBB, FNP as PCP - General (Family Medicine) Danielle Cindy SAUNDERS, MD as Consulting Physician (Oncology)  CHIEF COMPLAINTS/PURPOSE OF CONSULTATION: ANEMIA  HEMATOLOGY HISTORY  # IRON  DEFICIENCY ANEMIA CHRONIC [since 2019] AUG 2021- hb-9; MCV- 63; Iron  sat- 3%; EGD > 7 years ago [[place]]; colonoscopy/capsule-NA; PO iron  constipates.   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  JAK2 testing negative.  However bone marrow biopsy- [MAY 2024-negative for any myelophthisis process; suggestive of hemolysis] HOLD off metformin; and detromethomorphan- Bupropion. PNH testing-NEGATIVE; complement levels.;  Hemoglobin cascade; G6PD levels-WNL.  April 2024 abdominal ultrasound showed mild splenomegaly.    # 11/07/2022-RELAPSE-start  Rituximab  X1 [OCT 11th, 2024- ]G-4-anaphylactic reaction needing EpiPen  transporting to the emergency room.-Discontinue further rituximab . JAN 1st week, 2025- .500 mg BID; mycophenolate -  #History of heavy menstrual cycles  HISTORY OF PRESENTING ILLNESS: Alone. Ambulating independently.  Danielle [name] 48 y.o.  female with relapsed autoimmune hemolytic anemia steroid responsive -but DAT negative [ [suspect IgA] -is here for follow-up.  Patient is currently tapered off prednisone .  Patient stopped taking prednisone  about 1 week ago.  Patient has been on mycophenolate  500 mg twice a day for the last 3 weeks.  Infection of the hand post carpal tunnel surgery resolved.  Patient complains of ongoing fatigue.  Denies any shortness of breath or cough.  Patient also have blood in stool mostly hemorrhoidal especially with constipation.  Patient also complains of intermittent nausea with  regurgitation.  No blood in vomitus.  Review of Systems  Constitutional:  Positive for malaise/fatigue. Negative for chills, diaphoresis, fever and weight loss.  HENT:  Negative for nosebleeds and sore throat.   Eyes:  Negative for double vision.  Respiratory:  Negative for hemoptysis and sputum production.   Cardiovascular:  Negative for chest pain, palpitations, orthopnea and leg swelling.  Gastrointestinal:  Negative for abdominal pain, blood in stool, constipation, diarrhea, heartburn and melena.  Genitourinary:  Negative for dysuria, frequency and urgency.  Musculoskeletal:  Negative for back pain and joint pain.  Skin: Negative.  Negative for itching and rash.  Neurological:  Positive for tingling. Negative for focal weakness, weakness and headaches.  Endo/Heme/Allergies:  Does not bruise/bleed easily.  Psychiatric/Behavioral:  Negative for depression. The patient has insomnia. The patient is not nervous/anxious.     MEDICAL HISTORY:  Past Medical History:  Diagnosis Date   Anemia    Depression     SURGICAL HISTORY: Past Surgical History:  Procedure Laterality Date   ABDOMINAL HYSTERECTOMY     COLONOSCOPY WITH PROPOFOL  N/A 05/12/2022   Procedure: COLONOSCOPY WITH PROPOFOL ;  Surgeon: Danielle Carmine, MD;  Location: [facility] ENDOSCOPY;  Service: Endoscopy;  Laterality: N/A;   ESOPHAGOGASTRODUODENOSCOPY (EGD) WITH PROPOFOL  N/A 05/12/2022   Procedure: ESOPHAGOGASTRODUODENOSCOPY (EGD) WITH PROPOFOL ;  Surgeon: Danielle Carmine, MD;  Location: [facility] ENDOSCOPY;  Service: Endoscopy;  Laterality: N/A;   IR BONE MARROW BIOPSY & ASPIRATION  05/28/2022   TONSILLECTOMY      SOCIAL HISTORY: Social History   Socioeconomic History   Marital status: Single    Spouse name: Not on file   Number of children: Not on file   Years of education: Not on file   Highest education level: Not on file  Occupational History   Not on file  Tobacco  Use   Smoking status: Former    Current packs/day: 0.00     Average packs/day: 0.5 packs/day for 25.0 years (12.5 ttl pk-yrs)    Types: Cigarettes    Start date: 05/09/1997    Quit date: 05/10/2022    Years since quitting: 0.8   Smokeless tobacco: Never  Vaping Use   Vaping status: Never Used  Substance and Sexual Activity   Alcohol use: Not Currently   Drug use: Never   Sexual activity: Not on file  Other Topics Concern   Not on file  Social History Narrative   Lives in [place] with 4 kids; stay at home [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    Social Drivers of Corporate Investment Banker Strain: Not on file  Food Insecurity: No Food Insecurity (05/11/2022)   Hunger Vital Sign    Worried About Running Out of Food in the Last Year: Never true    Ran Out of Food in the Last Year: Never true  Transportation Needs: Not on file  Physical Activity: Not on file  Stress: Not on file  Social Connections: Not on file  Intimate Partner Violence: Not on file    FAMILY HISTORY: Family History  Problem Relation Age of Onset   Breast cancer Paternal Aunt     ALLERGIES:  is allergic to rituxan  [rituximab ].  MEDICATIONS:  Current Outpatient Medications  Medication Sig Dispense Refill   albuterol  (VENTOLIN  HFA) 108 (90 Base) MCG/ACT inhaler Inhale 2 puffs into the lungs every 6 (six) hours as needed for wheezing or shortness of breath. 8 g 2   folic acid  (FOLVITE ) 1 MG tablet Take 1 tablet (1 mg total) by mouth daily. 90 tablet 1   gabapentin (NEURONTIN) 100 MG capsule Take 1 capsule by mouth 3 (three) times daily.     hydrOXYzine  (ATARAX ) 25 MG tablet Take 1 tablet (25 mg total) by mouth 3 (three) times daily as needed for itching. 30 tablet 0   meclizine  (ANTIVERT ) 25 MG tablet Take 1 tablet (25 mg total) by mouth 3 (three) times daily as needed for dizziness. 30 tablet 0   metFORMIN (GLUCOPHAGE) 500 MG tablet Take 500 mg by mouth 2 (two) times daily with a meal.     mycophenolate  (CELLCEPT ) 500 MG tablet Take 1 tablet (500 mg  total) by mouth 2 (two) times daily. 60 tablet 3   Semaglutide-Weight Management (WEGOVY) 0.25 MG/0.5ML SOAJ Inject 0.25 mg into the skin once a week.     venlafaxine XR (EFFEXOR-XR) 37.5 MG 24 hr capsule Take 37.5 mg by mouth daily.     No current facility-administered medications for this visit.      PHYSICAL EXAMINATION:   Vitals:   03/04/23 0936  BP: 104/64  Pulse: 88  Temp: (!) 96.9 F (36.1 C)  SpO2: 100%       Filed Weights   03/04/23 0936  Weight: 223 lb (101.2 kg)        Physical Exam HENT:     Head: Normocephalic and atraumatic.     Mouth/Throat:     Pharynx: No oropharyngeal exudate.  Eyes:     Pupils: Pupils are equal, round, and reactive to light.  Cardiovascular:     Rate and Rhythm: Normal rate and regular rhythm.  Pulmonary:     Effort: Pulmonary effort is normal. No respiratory distress.     Breath sounds: Normal breath sounds. No wheezing.  Abdominal:     General: Bowel sounds are normal. There  is no distension.     Palpations: Abdomen is soft. There is no mass.     Tenderness: There is no abdominal tenderness. There is no guarding or rebound.  Musculoskeletal:        General: No tenderness. Normal range of motion.     Cervical back: Normal range of motion and neck supple.  Skin:    General: Skin is warm.  Neurological:     Mental Status: She is alert and oriented to person, place, and time.  Psychiatric:        Mood and Affect: Affect normal.     LABORATORY DATA:  I have reviewed the data as listed Lab Results  Component Value Date   WBC 3.1 (L) 03/04/2023   HGB 10.1 (L) 03/04/2023   HCT 28.0 (L) 03/04/2023   MCV 117.6 (H) 03/04/2023   PLT 201 03/04/2023   Recent Labs    05/10/22 2030 05/11/22 0901 05/12/22 0458 06/10/22 1013 06/17/22 0959 08/22/22 1011 10/31/22 0900 11/07/22 1242 01/07/23 0943 02/04/23 1031 03/04/23 0924  NA  --  138   < > 139   < > 138 138   < > 138 138 138  K  --  3.7   < > 3.6   < > 4.3 3.7    < > 3.9 3.9 4.3  CL  --  107   < > 103   < > 106 105   < > 106 103 104  CO2  --  25   < > 26   < > 23 25   < > 24 27 26   GLUCOSE  --  131*   < > 132*   < > 106* 139*   < > 106* 106* 102*  BUN  --  9   < > 17   < > 14 16   < > 14 10 11   CREATININE  --  0.53   < > 0.71   < > 0.63 0.58   < > 0.63 0.57 0.66  CALCIUM   --  8.7*   < > 9.2   < > 9.1 9.1   < > 9.1 9.2 8.9  GFRNONAA  --  >60   < > >60   < > >60 >60   < > >60 >60 >60  PROT 6.6 6.3*   < > 6.7  --  7.1 7.1  --   --   --   --   ALBUMIN 3.6 3.3*   < > 3.9  --  3.7 4.0  --   --   --   --   AST 32 36   < > 21  --  17 18  --   --   --   --   ALT 25 25   < > 18  --  13 16  --   --   --   --   ALKPHOS 51 53   < > 41  --  62 68  --   --   --   --   BILITOT 1.5* 1.9*   < > 0.9  --  0.6 1.2  --   --   --   --   BILIDIR 0.2 0.3*  --   --   --   --   --   --   --   --   --   IBILI 1.3* 1.6*  --   --   --   --   --   --   --   --   --    < > =  values in this interval not displayed.     No results found.  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  # Patient currently OFF prednisone  [ stopped/tapered OFF 10 mg a day]. Also on mycophenolate  500 mg twice daily-overall hemoglobin stable for the last 1 month.   # Today hemoglobin is 10.3-monitor for now.  If hemoglobin not improving or dropping recommend increasing the dose of mycophenolate  at next visit.  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  Stable.   # Tingling/ numbness BILATERAL UE-s/p Right carpal tunnel- surgery   on Dec 13th, 2024- s/p -infected as per patient currently on antibiotics- resolved.   # Nausea/ reguargiation- ? Sec  to wegowy-recommend follow-up with PCP  # Hemorroidal bleeding/constipation- continue miralax .   # Vaccinations: Flu host [oct 2024].   # DISPOSITION: #  follow up in 4  weeks- MD;  labs- cbc; reticulocyte count ;BMP;  LDH; Dr.B   All questions  were answered. The patient knows to call the clinic with any problems, questions or concerns.    Cindy JONELLE Joe, MD 03/04/2023 10:12 AM

## 2023-03-31 ENCOUNTER — Emergency Department

## 2023-03-31 ENCOUNTER — Other Ambulatory Visit: Payer: Self-pay

## 2023-03-31 ENCOUNTER — Emergency Department
Admission: EM | Admit: 2023-03-31 | Discharge: 2023-03-31 | Disposition: A | Discharge status: Home / Self Care | Source: Home / Self Care | Attending: Emergency Medicine | Admitting: Emergency Medicine

## 2023-03-31 DIAGNOSIS — M62838 Other muscle spasm: Secondary | ICD-10-CM | POA: Insufficient documentation

## 2023-03-31 DIAGNOSIS — R1013 Epigastric pain: Secondary | ICD-10-CM

## 2023-03-31 LAB — BASIC METABOLIC PANEL
Anion gap: 16 — ABNORMAL HIGH (ref 5–15)
BUN: 13 mg/dL (ref 6–20)
CO2: 18 mmol/L — ABNORMAL LOW (ref 22–32)
Calcium: 9.6 mg/dL (ref 8.9–10.3)
Chloride: 102 mmol/L (ref 98–111)
Creatinine, Ser: 0.69 mg/dL (ref 0.44–1.00)
GFR, Estimated: >60 mL/min (ref >60)
Glucose, Bld: 145 mg/dL — ABNORMAL HIGH (ref 70–99)
Potassium: 3.4 mmol/L — ABNORMAL LOW (ref 3.5–5.1)
Sodium: 136 mmol/L (ref 135–145)

## 2023-03-31 LAB — HEPATIC FUNCTION PANEL
ALT: 28 U/L (ref 0–44)
AST: 51 U/L — ABNORMAL HIGH (ref 15–41)
Albumin: 4.1 g/dL (ref 3.5–5.0)
Alkaline Phosphatase: 53 U/L (ref 38–126)
Bilirubin, Direct: 0.5 mg/dL — ABNORMAL HIGH (ref 0.0–0.2)
Indirect Bilirubin: 2.4 mg/dL — ABNORMAL HIGH (ref 0.3–0.9)
Total Bilirubin: 2.9 mg/dL — ABNORMAL HIGH (ref 0.0–1.2)
Total Protein: 7.1 g/dL (ref 6.5–8.1)

## 2023-03-31 LAB — CBC
HCT: 20.4 % — ABNORMAL LOW (ref 36.0–46.0)
Hemoglobin: 7.4 g/dL — ABNORMAL LOW (ref 12.0–15.0)
MCH: 41.3 pg — ABNORMAL HIGH (ref 26.0–34.0)
MCHC: 36.3 g/dL — ABNORMAL HIGH (ref 30.0–36.0)
MCV: 114 fL — ABNORMAL HIGH (ref 80.0–100.0)
Platelets: 117 10*3/uL — ABNORMAL LOW (ref 150–400)
RBC: 1.79 MIL/uL — ABNORMAL LOW (ref 3.87–5.11)
RDW: 18.4 % — ABNORMAL HIGH (ref 11.5–15.5)
WBC: 4.7 10*3/uL (ref 4.0–10.5)
nRBC: 1.1 % — ABNORMAL HIGH (ref 0.0–0.2)

## 2023-03-31 LAB — LIPASE, BLOOD: Lipase: 41 U/L (ref 11–51)

## 2023-03-31 LAB — TROPONIN I (HIGH SENSITIVITY)
Troponin I (High Sensitivity): 3 ng/L (ref <18)
Troponin I (High Sensitivity): 3 ng/L (ref <18)

## 2023-03-31 MED ORDER — FAMOTIDINE 20 MG PO TABS
40.0000 mg | ORAL_TABLET | Freq: Once | ORAL | Status: AC
  Administered 2023-03-31: 40 mg via ORAL
  Filled 2023-03-31: qty 2

## 2023-03-31 MED ORDER — ACETAMINOPHEN 325 MG PO TABS
650.0000 mg | ORAL_TABLET | Freq: Once | ORAL | Status: AC
  Administered 2023-03-31: 650 mg via ORAL
  Filled 2023-03-31: qty 2

## 2023-03-31 MED ORDER — FAMOTIDINE 20 MG PO TABS: 20.0000 mg | ORAL_TABLET | Freq: Two times a day (BID) | ORAL | 0 refills | Status: DC

## 2023-03-31 MED ORDER — ALUMINUM-MAGNESIUM-SIMETHICONE 200-200-20 MG/5ML PO SUSP: 30.0000 mL | Freq: Three times a day (TID) | ORAL | 0 refills | Status: DC

## 2023-03-31 MED ORDER — METOCLOPRAMIDE HCL 5 MG/ML IJ SOLN
10.0000 mg | INTRAMUSCULAR | Status: AC
Start: 2023-03-31 — End: 2023-03-31
  Administered 2023-03-31: 10 mg via INTRAVENOUS
  Filled 2023-03-31: qty 2

## 2023-03-31 MED ORDER — ALUM & MAG HYDROXIDE-SIMETH 200-200-20 MG/5ML PO SUSP
30.0000 mL | Freq: Once | ORAL | Status: AC
  Administered 2023-03-31: 30 mL via ORAL
  Filled 2023-03-31: qty 30

## 2023-03-31 NOTE — ED Triage Notes (Signed)
 Pt to ED via EMS from home, pt states she had cp that woke her from her sleep, radiation into back. Denies cardiac hx. Pt was given 324 asa by ems and 4mg  zofran. Pt reports feeling lightheaded also.

## 2023-03-31 NOTE — ED Provider Notes (Signed)
 [facility] Provider Note    Event Date/Time   First MD Initiated Contact with Patient 03/31/23 [phone]     (approximate)   History   Chief Complaint: Chest Pain   HPI  Danielle [name] is a 48 y.o. female with a history of anemia who comes ED complaining of chest pain that woke her from sleep.  She indicates the left superior trapezius as the location of the pain.  No shortness of breath diaphoresis or vomiting.  Also endorses some upper abdominal pain.  No recent exertional symptoms.          Physical Exam   Triage Vital Signs: ED Triage Vitals  Encounter Vitals Group     BP 03/31/23 0110 (!) 107/59     Systolic BP Percentile --      Diastolic BP Percentile --      Pulse Rate 03/31/23 0110 (!) 109     Resp 03/31/23 0110 20     Temp 03/31/23 0110 98.7 F (37.1 C)     Temp Source 03/31/23 0110 Oral     SpO2 03/31/23 0110 100 %     Weight 03/31/23 0109 219 lb (99.3 kg)     Height 03/31/23 0109 5\' 3"  (1.6 m)     Head Circumference --      Peak Flow --      Pain Score 03/31/23 0109 9     Pain Loc --      Pain Education --      Exclude from Growth Chart --     Most recent vital signs: Vitals:   03/31/23 0530 03/31/23 0610  BP: (!) 96/52   Pulse: 84   Resp: 19   Temp:  98.4 F (36.9 C)  SpO2: 98%     General: Awake, no distress.  CV:  Good peripheral perfusion.  Regular rate rhythm Resp:  Normal effort.  Clear to auscultation bilaterally Abd:  No distention.  Soft with mild epigastric tenderness. Other:  Tenderness over the left superior trapezius which reproduces her pain.  Musculature is tight.   ED Results / Procedures / Treatments   Labs (all labs ordered are listed, but only abnormal results are displayed) Labs Reviewed  BASIC METABOLIC PANEL - Abnormal; Notable for the following components:      Result Value   Potassium 3.4 (*)    CO2 18 (*)    Glucose, Bld 145 (*)    Anion gap 16 (*)    All other components within  normal limits  CBC - Abnormal; Notable for the following components:   RBC 1.79 (*)    Hemoglobin 7.4 (*)    HCT 20.4 (*)    MCV 114.0 (*)    MCH 41.3 (*)    MCHC 36.3 (*)    RDW 18.4 (*)    Platelets 117 (*)    nRBC 1.1 (*)    All other components within normal limits  HEPATIC FUNCTION PANEL - Abnormal; Notable for the following components:   AST 51 (*)    Total Bilirubin 2.9 (*)    Bilirubin, Direct 0.5 (*)    Indirect Bilirubin 2.4 (*)    All other components within normal limits  LIPASE, BLOOD  TROPONIN I (HIGH SENSITIVITY)  TROPONIN I (HIGH SENSITIVITY)     EKG Interpreted by me Sinus tachycardia rate 113.  Normal axis intervals QRS ST segments and T waves   RADIOLOGY Chest x-ray interpreted by me, unremarkable.  Radiology report reviewed.  Ultrasound right upper quadrant unremarkable   PROCEDURES:  Procedures   MEDICATIONS ORDERED IN ED: Medications  acetaminophen (TYLENOL) tablet 650 mg (650 mg Oral Given 03/31/23 0226)  alum & mag hydroxide-simeth (MAALOX/MYLANTA) 200-200-20 MG/5ML suspension 30 mL (30 mLs Oral Given 03/31/23 0439)  famotidine (PEPCID) tablet 40 mg (40 mg Oral Given 03/31/23 0439)  metoCLOPramide (REGLAN) injection 10 mg (10 mg Intravenous Given 03/31/23 0439)     IMPRESSION / MDM / ASSESSMENT AND PLAN / ED COURSE  I reviewed the triage vital signs and the nursing notes.  DDx: Cholecystitis, pancreatitis, gastritis/GERD, muscle spasm, less likely NSTEMI.  Doubt PE dissection pneumothorax or pericardial effusion.  Patient's presentation is most consistent with acute presentation with potential threat to life or bodily function.  Patient presents with upper abdominal pain as well as clinically apparent right trapezius spasm.  Workup is reassuring.  After antacids, patient reports she feels better.  Stable for discharge       FINAL CLINICAL IMPRESSION(S) / ED DIAGNOSES   Final diagnoses:  Epigastric pain  Trapezius muscle spasm      Rx / DC Orders   ED Discharge Orders          Ordered    aluminum-magnesium hydroxide-simethicone (MAALOX) 200-200-20 MG/5ML SUSP  3 times daily before meals & bedtime        03/31/23 0616    famotidine (PEPCID) 20 MG tablet  2 times daily        03/31/23 0347             Note:  This document was prepared using Dragon voice recognition software and may include unintentional dictation errors.   Sharman Cheek, MD 03/31/23 [id number]

## 2023-04-01 ENCOUNTER — Other Ambulatory Visit: Payer: Self-pay

## 2023-04-01 ENCOUNTER — Inpatient Hospital Stay
Admission: EM | Admit: 2023-04-01 | Discharge: 2023-04-09 | DRG: 809 | Disposition: A | Discharge status: Home / Self Care | Attending: Obstetrics and Gynecology | Admitting: Obstetrics and Gynecology

## 2023-04-01 ENCOUNTER — Inpatient Hospital Stay (HOSPITAL_BASED_OUTPATIENT_CLINIC_OR_DEPARTMENT_OTHER): Payer: Medicaid Other | Admitting: Internal Medicine

## 2023-04-01 ENCOUNTER — Inpatient Hospital Stay: Payer: Medicaid Other | Attending: Internal Medicine

## 2023-04-01 ENCOUNTER — Ambulatory Visit

## 2023-04-01 ENCOUNTER — Encounter: Payer: Self-pay | Admitting: Internal Medicine

## 2023-04-01 VITALS — HR 127 | Temp 99.5°F | Resp 18 | Ht 63.0 in | Wt 214.0 lb

## 2023-04-01 DIAGNOSIS — T886XXA Anaphylactic reaction due to adverse effect of correct drug or medicament properly administered, initial encounter: Secondary | ICD-10-CM | POA: Diagnosis not present

## 2023-04-01 DIAGNOSIS — Z1152 Encounter for screening for COVID-19: Secondary | ICD-10-CM

## 2023-04-01 DIAGNOSIS — D509 Iron deficiency anemia, unspecified: Secondary | ICD-10-CM | POA: Diagnosis not present

## 2023-04-01 DIAGNOSIS — R079 Chest pain, unspecified: Secondary | ICD-10-CM | POA: Diagnosis present

## 2023-04-01 DIAGNOSIS — M62830 Muscle spasm of back: Secondary | ICD-10-CM | POA: Diagnosis present

## 2023-04-01 DIAGNOSIS — Z79624 Long term (current) use of inhibitors of nucleotide synthesis: Secondary | ICD-10-CM | POA: Insufficient documentation

## 2023-04-01 DIAGNOSIS — I272 Pulmonary hypertension, unspecified: Secondary | ICD-10-CM | POA: Diagnosis present

## 2023-04-01 DIAGNOSIS — E876 Hypokalemia: Secondary | ICD-10-CM | POA: Diagnosis present

## 2023-04-01 DIAGNOSIS — Z803 Family history of malignant neoplasm of breast: Secondary | ICD-10-CM | POA: Diagnosis not present

## 2023-04-01 DIAGNOSIS — R739 Hyperglycemia, unspecified: Secondary | ICD-10-CM | POA: Diagnosis not present

## 2023-04-01 DIAGNOSIS — K760 Fatty (change of) liver, not elsewhere classified: Secondary | ICD-10-CM

## 2023-04-01 DIAGNOSIS — K2900 Acute gastritis without bleeding: Secondary | ICD-10-CM

## 2023-04-01 DIAGNOSIS — Z6838 Body mass index (BMI) 38.0-38.9, adult: Secondary | ICD-10-CM

## 2023-04-01 DIAGNOSIS — R519 Headache, unspecified: Secondary | ICD-10-CM | POA: Diagnosis not present

## 2023-04-01 DIAGNOSIS — R0609 Other forms of dyspnea: Secondary | ICD-10-CM | POA: Diagnosis present

## 2023-04-01 DIAGNOSIS — D649 Anemia, unspecified: Secondary | ICD-10-CM

## 2023-04-01 DIAGNOSIS — D5919 Other autoimmune hemolytic anemia: Principal | ICD-10-CM | POA: Diagnosis present

## 2023-04-01 DIAGNOSIS — Z9189 Other specified personal risk factors, not elsewhere classified: Secondary | ICD-10-CM

## 2023-04-01 DIAGNOSIS — D472 Monoclonal gammopathy: Secondary | ICD-10-CM | POA: Insufficient documentation

## 2023-04-01 DIAGNOSIS — Z79899 Other long term (current) drug therapy: Secondary | ICD-10-CM

## 2023-04-01 DIAGNOSIS — K219 Gastro-esophageal reflux disease without esophagitis: Secondary | ICD-10-CM | POA: Diagnosis present

## 2023-04-01 DIAGNOSIS — T380X5A Adverse effect of glucocorticoids and synthetic analogues, initial encounter: Secondary | ICD-10-CM | POA: Diagnosis not present

## 2023-04-01 DIAGNOSIS — Z90710 Acquired absence of both cervix and uterus: Secondary | ICD-10-CM

## 2023-04-01 DIAGNOSIS — Z87891 Personal history of nicotine dependence: Secondary | ICD-10-CM | POA: Insufficient documentation

## 2023-04-01 DIAGNOSIS — K5900 Constipation, unspecified: Secondary | ICD-10-CM | POA: Diagnosis present

## 2023-04-01 DIAGNOSIS — D696 Thrombocytopenia, unspecified: Secondary | ICD-10-CM | POA: Diagnosis present

## 2023-04-01 DIAGNOSIS — F32A Depression, unspecified: Secondary | ICD-10-CM | POA: Diagnosis present

## 2023-04-01 DIAGNOSIS — Z7985 Long-term (current) use of injectable non-insulin antidiabetic drugs: Secondary | ICD-10-CM

## 2023-04-01 DIAGNOSIS — Z720 Tobacco use: Secondary | ICD-10-CM | POA: Diagnosis present

## 2023-04-01 DIAGNOSIS — D591 Autoimmune hemolytic anemia, unspecified: Secondary | ICD-10-CM | POA: Diagnosis not present

## 2023-04-01 DIAGNOSIS — E611 Iron deficiency: Secondary | ICD-10-CM | POA: Diagnosis present

## 2023-04-01 DIAGNOSIS — Z888 Allergy status to other drugs, medicaments and biological substances status: Secondary | ICD-10-CM | POA: Diagnosis not present

## 2023-04-01 DIAGNOSIS — E66812 Obesity, class 2: Secondary | ICD-10-CM | POA: Diagnosis present

## 2023-04-01 DIAGNOSIS — Z7984 Long term (current) use of oral hypoglycemic drugs: Secondary | ICD-10-CM | POA: Diagnosis not present

## 2023-04-01 DIAGNOSIS — D539 Nutritional anemia, unspecified: Secondary | ICD-10-CM | POA: Diagnosis present

## 2023-04-01 HISTORY — DX: Fatty (change of) liver, not elsewhere classified: K76.0

## 2023-04-01 LAB — BASIC METABOLIC PANEL
Anion gap: 11 (ref 5–15)
Anion gap: 9 (ref 5–15)
BUN: 10 mg/dL (ref 6–20)
BUN: 11 mg/dL (ref 6–20)
CO2: 22 mmol/L (ref 22–32)
CO2: 21 mmol/L — ABNORMAL LOW (ref 22–32)
Calcium: 8.8 mg/dL — ABNORMAL LOW (ref 8.9–10.3)
Calcium: 9.3 mg/dL (ref 8.9–10.3)
Chloride: 100 mmol/L (ref 98–111)
Chloride: 106 mmol/L (ref 98–111)
Creatinine, Ser: 0.67 mg/dL (ref 0.44–1.00)
Creatinine, Ser: 0.66 mg/dL (ref 0.44–1.00)
GFR, Estimated: >60 mL/min (ref >60)
GFR, Estimated: >60 mL/min (ref >60)
Glucose, Bld: 138 mg/dL — ABNORMAL HIGH (ref 70–99)
Glucose, Bld: 153 mg/dL — ABNORMAL HIGH (ref 70–99)
Potassium: 3.1 mmol/L — ABNORMAL LOW (ref 3.5–5.1)
Potassium: 3.5 mmol/L (ref 3.5–5.1)
Sodium: 133 mmol/L — ABNORMAL LOW (ref 135–145)
Sodium: 136 mmol/L (ref 135–145)

## 2023-04-01 LAB — CBC WITH DIFFERENTIAL (CANCER CENTER ONLY)
Abs Immature Granulocytes: 0.04 10*3/uL (ref 0.00–0.07)
Basophils Absolute: 0 10*3/uL (ref 0.0–0.1)
Basophils Relative: 0 %
Eosinophils Absolute: 0.1 10*3/uL (ref 0.0–0.5)
Eosinophils Relative: 2 %
HCT: 18.9 % — ABNORMAL LOW (ref 36.0–46.0)
Hemoglobin: 6.8 g/dL — CL (ref 12.0–15.0)
Immature Granulocytes: 1 %
Lymphocytes Relative: 29 %
Lymphs Abs: 0.9 10*3/uL (ref 0.7–4.0)
MCH: 41 pg — ABNORMAL HIGH (ref 26.0–34.0)
MCHC: 36 g/dL (ref 30.0–36.0)
MCV: 113.9 fL — ABNORMAL HIGH (ref 80.0–100.0)
Monocytes Absolute: 0.1 10*3/uL (ref 0.1–1.0)
Monocytes Relative: 3 %
Neutro Abs: 2.1 10*3/uL (ref 1.7–7.7)
Neutrophils Relative %: 65 %
Platelet Count: 133 10*3/uL — ABNORMAL LOW (ref 150–400)
RBC: 1.66 MIL/uL — ABNORMAL LOW (ref 3.87–5.11)
RDW: 18.6 % — ABNORMAL HIGH (ref 11.5–15.5)
WBC Count: 3.2 10*3/uL — ABNORMAL LOW (ref 4.0–10.5)
nRBC: 1.6 % — ABNORMAL HIGH (ref 0.0–0.2)

## 2023-04-01 LAB — RESP PANEL BY RT-PCR (RSV, FLU A&B, COVID)  RVPGX2
Influenza A by PCR: NEGATIVE
Influenza B by PCR: NEGATIVE
Resp Syncytial Virus by PCR: NEGATIVE
SARS Coronavirus 2 by RT PCR: NEGATIVE

## 2023-04-01 LAB — CBC
HCT: 17.7 % — ABNORMAL LOW (ref 36.0–46.0)
Hemoglobin: 6.6 g/dL — ABNORMAL LOW (ref 12.0–15.0)
MCH: 42 pg — ABNORMAL HIGH (ref 26.0–34.0)
MCHC: 37.3 g/dL — ABNORMAL HIGH (ref 30.0–36.0)
MCV: 112.7 fL — ABNORMAL HIGH (ref 80.0–100.0)
Platelets: 95 10*3/uL — ABNORMAL LOW (ref 150–400)
RBC: 1.57 MIL/uL — ABNORMAL LOW (ref 3.87–5.11)
RDW: 18.9 % — ABNORMAL HIGH (ref 11.5–15.5)
WBC: 3.2 10*3/uL — ABNORMAL LOW (ref 4.0–10.5)
nRBC: 0.9 % — ABNORMAL HIGH (ref 0.0–0.2)

## 2023-04-01 LAB — RETICULOCYTES
Immature Retic Fract: 28 % — ABNORMAL HIGH (ref 2.3–15.9)
RBC.: 1.68 MIL/uL — ABNORMAL LOW (ref 3.87–5.11)
Retic Count, Absolute: 58.1 10*3/uL (ref 19.0–186.0)
Retic Ct Pct: 3.5 % — ABNORMAL HIGH (ref 0.4–3.1)

## 2023-04-01 LAB — APTT: aPTT: 26 s (ref 24–36)

## 2023-04-01 LAB — CBG MONITORING, ED: Glucose-Capillary: 204 mg/dL — ABNORMAL HIGH (ref 70–99)

## 2023-04-01 LAB — HEMOGLOBIN A1C
Hgb A1c MFr Bld: 4.4 % — ABNORMAL LOW (ref 4.8–5.6)
Mean Plasma Glucose: 79.58 mg/dL

## 2023-04-01 LAB — PREPARE RBC (CROSSMATCH)

## 2023-04-01 LAB — GLUCOSE, CAPILLARY: Glucose-Capillary: 187 mg/dL — ABNORMAL HIGH (ref 70–99)

## 2023-04-01 LAB — PROTIME-INR
INR: 1.1 (ref 0.8–1.2)
Prothrombin Time: 14.5 s (ref 11.4–15.2)

## 2023-04-01 LAB — LACTATE DEHYDROGENASE: LDH: 2151 U/L — ABNORMAL HIGH (ref 98–192)

## 2023-04-01 LAB — MAGNESIUM: Magnesium: 1.9 mg/dL (ref 1.7–2.4)

## 2023-04-01 MED ORDER — ACETAMINOPHEN 650 MG RE SUPP: 650.0000 mg | Freq: Four times a day (QID) | RECTAL | Status: AC | PRN

## 2023-04-01 MED ORDER — FOLIC ACID 1 MG PO TABS
1.0000 mg | ORAL_TABLET | Freq: Every day | ORAL | Status: DC
  Administered 2023-04-02 – 2023-04-09 (×8): 1 mg via ORAL
  Filled 2023-04-01 (×8): qty 1

## 2023-04-01 MED ORDER — POTASSIUM CHLORIDE CRYS ER 20 MEQ PO TBCR: 40.0000 meq | EXTENDED_RELEASE_TABLET | Freq: Once | ORAL | Status: DC

## 2023-04-01 MED ORDER — SENNOSIDES-DOCUSATE SODIUM 8.6-50 MG PO TABS
1.0000 | ORAL_TABLET | Freq: Every evening | ORAL | Status: DC | PRN
  Administered 2023-04-03 – 2023-04-04 (×2): 1 via ORAL
  Filled 2023-04-01 (×2): qty 1

## 2023-04-01 MED ORDER — ONDANSETRON HCL 4 MG PO TABS: 4.0000 mg | ORAL_TABLET | Freq: Four times a day (QID) | ORAL | Status: AC | PRN

## 2023-04-01 MED ORDER — INSULIN ASPART 100 UNIT/ML IJ SOLN
0.0000 [IU] | Freq: Every day | INTRAMUSCULAR | Status: DC
  Administered 2023-04-02: 2 [IU] via SUBCUTANEOUS
  Filled 2023-04-01 (×2): qty 1

## 2023-04-01 MED ORDER — SODIUM CHLORIDE 0.9 % IV BOLUS
500.0000 mL | Freq: Once | INTRAVENOUS | Status: AC
  Administered 2023-04-01: 500 mL via INTRAVENOUS

## 2023-04-01 MED ORDER — INSULIN ASPART 100 UNIT/ML IJ SOLN: 0.0000 [IU] | Freq: Every day | INTRAMUSCULAR | Status: DC

## 2023-04-01 MED ORDER — SODIUM CHLORIDE 0.9 % IV SOLN
10.0000 mL/h | Freq: Once | INTRAVENOUS | Status: AC
  Administered 2023-04-01: 10 mL/h via INTRAVENOUS

## 2023-04-01 MED ORDER — INSULIN ASPART 100 UNIT/ML IJ SOLN
0.0000 [IU] | Freq: Three times a day (TID) | INTRAMUSCULAR | Status: DC
  Filled 2023-04-01: qty 1

## 2023-04-01 MED ORDER — INSULIN ASPART 100 UNIT/ML IJ SOLN
0.0000 [IU] | Freq: Three times a day (TID) | INTRAMUSCULAR | Status: DC
  Administered 2023-04-02 – 2023-04-03 (×2): 2 [IU] via SUBCUTANEOUS
  Administered 2023-04-04: 3 [IU] via SUBCUTANEOUS
  Administered 2023-04-05: 2 [IU] via SUBCUTANEOUS
  Administered 2023-04-05: 1 [IU] via SUBCUTANEOUS
  Filled 2023-04-01 (×5): qty 1

## 2023-04-01 MED ORDER — METHYLPREDNISOLONE SODIUM SUCC 125 MG IJ SOLR
100.0000 mg | Freq: Once | INTRAMUSCULAR | Status: AC
  Administered 2023-04-01: 100 mg via INTRAVENOUS
  Filled 2023-04-01: qty 2

## 2023-04-01 MED ORDER — ACETAMINOPHEN 325 MG PO TABS
650.0000 mg | ORAL_TABLET | Freq: Four times a day (QID) | ORAL | Status: AC | PRN
  Administered 2023-04-01 – 2023-04-05 (×6): 650 mg via ORAL
  Filled 2023-04-01 (×6): qty 2

## 2023-04-01 MED ORDER — ONDANSETRON HCL 4 MG/2ML IJ SOLN
4.0000 mg | Freq: Four times a day (QID) | INTRAMUSCULAR | Status: AC | PRN
  Administered 2023-04-02 – 2023-04-04 (×3): 4 mg via INTRAVENOUS
  Filled 2023-04-01 (×3): qty 2

## 2023-04-01 NOTE — Progress Notes (Signed)
 [facility] CONSULT NOTE  Patient Care Team: Armando Gang, FNP as PCP - General (Family Medicine) Earna Coder, MD as Consulting Physician (Oncology)  CHIEF COMPLAINTS/PURPOSE OF CONSULTATION: ANEMIA  HEMATOLOGY HISTORY  # IRON DEFICIENCY ANEMIA CHRONIC [since 2019] AUG 2021- hb-9; MCV- 63; Iron sat- 3%; EGD > 7 years ago [[place]]; colonoscopy/capsule-NA; PO iron constipates.   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  JAK2 testing negative.  However bone marrow biopsy- [MAY 2024-negative for any myelophthisis process; suggestive of hemolysis] HOLD off metformin; and detromethomorphan- Bupropion. PNH testing-NEGATIVE; complement levels.;  Hemoglobin cascade; G6PD levels-WNL.  April 2024 abdominal ultrasound showed mild splenomegaly.    # 11/07/2022-RELAPSE-start  Rituximab X1 [OCT 11th, 2024- ]G-4-anaphylactic reaction needing EpiPen transporting to the emergency room.-Discontinue further rituximab. JAN 1st week, 2025- .500 mg BID; mycophenolate-  #History of heavy menstrual cycles  HISTORY OF PRESENTING ILLNESS: with daughtre. . Ambulating in a wheel chair.   Danielle [name] 48 y.o.  female with relapsed autoimmune hemolytic anemia steroid responsive -but DAT negative [ [suspect IgA] off steroids; on mycophenolate -is here for follow-up.  Patient complains of extreme fatigue. Dyspnea on exertion.   Light headedness: YES;  Blood in stool: YES hemorrhoids    Was d/c from [facility] ED yesterday, dizziness x2 weeks. She states heart rate has been going up.    Review of Systems  Constitutional:  Positive for malaise/fatigue. Negative for chills, diaphoresis, fever and weight loss.  HENT:  Negative for nosebleeds and sore throat.   Eyes:   Negative for double vision.  Respiratory:  Positive for shortness of breath. Negative for hemoptysis and sputum production.   Cardiovascular:  Positive for leg swelling. Negative for chest pain, palpitations and orthopnea.  Gastrointestinal:  Negative for abdominal pain, blood in stool, constipation, diarrhea, heartburn and melena.  Genitourinary:  Negative for dysuria, frequency and urgency.  Musculoskeletal:  Negative for back pain and joint pain.  Skin: Negative.  Negative for itching and rash.  Neurological:  Positive for tingling. Negative for focal weakness, weakness and headaches.  Endo/Heme/Allergies:  Does not bruise/bleed easily.  Psychiatric/Behavioral:  Negative for depression. The patient has insomnia. The patient is not nervous/anxious.     MEDICAL HISTORY:  Past Medical History:  Diagnosis Date   Anemia    Depression    Hepatic steatosis 04/01/2023    SURGICAL HISTORY: Past Surgical History:  Procedure Laterality Date   ABDOMINAL HYSTERECTOMY     COLONOSCOPY WITH PROPOFOL N/A 05/12/2022   Procedure: COLONOSCOPY WITH PROPOFOL;  Surgeon: Midge Minium, MD;  Location: [facility] ENDOSCOPY;  Service: Endoscopy;  Laterality: N/A;   ESOPHAGOGASTRODUODENOSCOPY (EGD) WITH PROPOFOL N/A 05/12/2022   Procedure: ESOPHAGOGASTRODUODENOSCOPY (EGD) WITH PROPOFOL;  Surgeon: Midge Minium, MD;  Location: [facility] ENDOSCOPY;  Service: Endoscopy;  Laterality: N/A;   IR BONE MARROW BIOPSY & ASPIRATION  05/28/2022   TONSILLECTOMY      SOCIAL HISTORY: Social History   Socioeconomic History   Marital status: Single    Spouse name: Not on file   Number of children: Not on file   Years of education: Not on file   Highest education level: Not on file  Occupational History   Not on file  Tobacco Use   Smoking status: Former    Current packs/day: 0.00    Average packs/day: 0.5  packs/day for 25.0 years (12.5 ttl pk-yrs)    Types: Cigarettes    Start date: 05/09/1997    Quit date: 05/10/2022    Years  since quitting: 0.8   Smokeless tobacco: Never  Vaping Use   Vaping status: Never Used  Substance and Sexual Activity   Alcohol use: Not Currently   Drug use: Never   Sexual activity: Not Currently  Other Topics Concern   Not on file  Social History Narrative   Lives in [place] with 4 kids; stay at home [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    Social Drivers of Corporate investment banker Strain: Not on file  Food Insecurity: No Food Insecurity (04/01/2023)   Hunger Vital Sign    Worried About Running Out of Food in the Last Year: Never true    Ran Out of Food in the Last Year: Never true  Transportation Needs: No Transportation Needs (04/01/2023)   PRAPARE - Administrator, Civil Service (Medical): No    Lack of Transportation (Non-Medical): No  Physical Activity: Not on file  Stress: Not on file  Social Connections: Unknown (04/01/2023)   Social Connection and Isolation Panel [NHANES]    Frequency of Communication with Friends and Family: More than three times a week    Frequency of Social Gatherings with Friends and Family: More than three times a week    Attends Religious Services: More than 4 times per year    Active Member of [employer] or Organizations: No    Attends Banker Meetings: Never    Marital Status: Patient declined  Catering manager Violence: Not At Risk (04/01/2023)   Humiliation, Afraid, Rape, and Kick questionnaire    Fear of Current or Ex-Partner: No    Emotionally Abused: No    Physically Abused: No    Sexually Abused: No    FAMILY HISTORY: Family History  Problem Relation Age of Onset   Breast cancer Paternal Aunt     ALLERGIES:  is allergic to rituxan [rituximab].  MEDICATIONS:  No current facility-administered medications for this visit.   No current outpatient medications on file.   Facility-Administered Medications Ordered in Other Visits  Medication Dose Route Frequency Provider Last Rate Last Admin    acetaminophen (TYLENOL) tablet 650 mg  650 mg Oral Q6H PRN Cox, Amy N, DO       Or   acetaminophen (TYLENOL) suppository 650 mg  650 mg Rectal Q6H PRN Cox, Amy N, DO       insulin aspart (novoLOG) injection 0-5 Units  0-5 Units Subcutaneous QHS Cox, Amy N, DO       [START ON 04/02/2023] insulin aspart (novoLOG) injection 0-9 Units  0-9 Units Subcutaneous TID WC Cox, Amy N, DO       ondansetron (ZOFRAN) tablet 4 mg  4 mg Oral Q6H PRN Cox, Amy N, DO       Or   ondansetron (ZOFRAN) injection 4 mg  4 mg Intravenous Q6H PRN Cox, Amy N, DO       senna-docusate (Senokot-S) tablet 1 tablet  1 tablet Oral QHS PRN Cox, Amy N, DO          PHYSICAL EXAMINATION:   Vitals:   04/01/23 1017  Pulse: (!) 127  Resp: 18  Temp: 99.5 F (37.5 C)  SpO2: 100%       Filed Weights   04/01/23 1017  Weight: 214 lb (97.1 kg)        Physical  Exam HENT:     Head: Normocephalic and atraumatic.     Mouth/Throat:     Pharynx: No oropharyngeal exudate.  Eyes:     Pupils: Pupils are equal, round, and reactive to light.  Cardiovascular:     Rate and Rhythm: Normal rate and regular rhythm.  Pulmonary:     Effort: Pulmonary effort is normal. No respiratory distress.     Breath sounds: Normal breath sounds. No wheezing.  Abdominal:     General: Bowel sounds are normal. There is no distension.     Palpations: Abdomen is soft. There is no mass.     Tenderness: There is no abdominal tenderness. There is no guarding or rebound.  Musculoskeletal:        General: No tenderness. Normal range of motion.     Cervical back: Normal range of motion and neck supple.  Skin:    General: Skin is warm.  Neurological:     Mental Status: She is alert and oriented to person, place, and time.  Psychiatric:        Mood and Affect: Affect normal.     LABORATORY DATA:  I have reviewed the data as listed Lab Results  Component Value Date   WBC 3.2 (L) 04/01/2023   HGB 6.6 (L) 04/01/2023   HCT 17.7 (L) 04/01/2023    MCV 112.7 (H) 04/01/2023   PLT 95 (L) 04/01/2023   Recent Labs    05/10/22 2030 05/11/22 0901 05/12/22 0458 08/22/22 1011 10/31/22 0900 11/07/22 1242 03/31/23 0111 03/31/23 0426 04/01/23 1006 04/01/23 1157  NA  --  138   < > 138 138   < > 136  --  133* 136  K  --  3.7   < > 4.3 3.7   < > 3.4*  --  3.1* 3.5  CL  --  107   < > 106 105   < > 102  --  100 106  CO2  --  25   < > 23 25   < > 18*  --  22 21*  GLUCOSE  --  131*   < > 106* 139*   < > 145*  --  138* 153*  BUN  --  9   < > 14 16   < > 13  --  10 11  CREATININE  --  0.53   < > 0.63 0.58   < > 0.69  --  0.67 0.66  CALCIUM  --  8.7*   < > 9.1 9.1   < > 9.6  --  8.8* 9.3  GFRNONAA  --  >60   < > >60 >60   < > >60  --  >60 >60  PROT 6.6 6.3*   < > 7.1 7.1  --   --  7.1  --   --   ALBUMIN 3.6 3.3*   < > 3.7 4.0  --   --  4.1  --   --   AST 32 36   < > 17 18  --   --  51*  --   --   ALT 25 25   < > 13 16  --   --  28  --   --   ALKPHOS 51 53   < > 62 68  --   --  53  --   --   BILITOT 1.5* 1.9*   < > 0.6 1.2  --   --  2.9*  --   --  BILIDIR 0.2 0.3*  --   --   --   --   --  0.5*  --   --   IBILI 1.3* 1.6*  --   --   --   --   --  2.4*  --   --    < > = values in this interval not displayed.     US ABDOMEN LIMITED RUQ (LIVER/GB) Result Date: 03/31/2023 CLINICAL DATA:  Epigastric pain. EXAM: ULTRASOUND ABDOMEN LIMITED RIGHT UPPER QUADRANT COMPARISON:  05/19/2022 FINDINGS: Gallbladder: No gallstones or gallbladder wall thickening. No pericholecystic fluid. The sonographer reports no sonographic Murphy's sign. Common bile duct: Diameter: 3 mm Liver: Diffuse coarsening of liver parenchyma with decreased through transmission suggests fatty deposition. No focal abnormality within the liver parenchyma. Portal vein is patent on color Doppler imaging with normal direction of blood flow towards the liver. Other: None. IMPRESSION: 1. No acute findings. 2. Increased echogenicity of liver parenchyma suggests steatosis. Electronically Signed    By: Kennith Center M.D.   On: 03/31/2023 05:53   DG Chest 1 View Result Date: 03/31/2023 CLINICAL DATA:  Chest pain EXAM: PORTABLE CHEST 1 VIEW COMPARISON:  11/07/2022 FINDINGS: The heart size and mediastinal contours are within normal limits. Both lungs are clear. The visualized skeletal structures are unremarkable. IMPRESSION: No active disease. Electronically Signed   By: Alcide Clever M.D.   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  # Patient currently OFF prednisone [ stopped/tapered OFF 10 mg a day] x2 months. Also on mycophenolate 500 mg twice daily-, however today Hb 6.8- quite symptomatic- recommend urgent evaluation in ER/ hospitalization. Plan IV steroids/ Blood transfusion. Consider IVIG. Patient poor candidate for Rituximab infusion given G-4 reaction.   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  Stable.   # Hemorroidal bleeding/constipation- continue miralax.   # Vaccinations: Flu host [oct 2024].   # DISPOSITION: # plan ER today-  #  follow up TBD-Dr.B  Discussed with Dr. Sedalia Muta- pt will be admitted to hospital. Will follow up in AM-re: IV solumderol/IVIG-  GB  All questions were answered. The patient knows to call the clinic with any problems, questions or concerns.    Earna Coder, MD 04/01/2023 7:00 PM

## 2023-04-01 NOTE — H&P (Addendum)
 History and Physical   Danielle [name] ZOX:[id number] DOB: Nov 05, 1975 DOA: 04/01/2023  PCP: Armando Gang, FNP  Outpatient Specialists: Dr. Donneta Romberg, [facility] hematologist/oncologist Patient coming from: Outpatient cancer center  I have personally briefly reviewed patient's old medical records in [facility] EMR.  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    Patient was sent from outpatient hematology/oncology clinic.  Vitals in the ED showed Tmax of 100, respiration rate of 18, heart rate 127 and on repeat was 103, blood pressure 101/55, SpO2 100% on room air.  Serum sodium is 136, potassium 3.5, chloride 106, bicarb 21, BUN 11, serum creatinine 0.66, EGFR greater than 60, nonfasting blood glucose 153.  ED treatment: Solu-Medrol 100 mg IV one-time dose, sodium chloride 500 mL liter bolus.  Patient has been typed and crossed.  2 units PRBC has been ordered for transfusion. ------------------------------------ At bedside, patient was able to tell me her first and last name, age, location, current calendar year.  Patient reports she has been feeling dizzy and lightheaded for several days prompting her to present to her oncology/hematology clinic.  Labs were obtained outpatient and patient was sent to the ED for blood transfusion and IV steroids.  Patient reports that she feels generalized weakness, discomfort, and dizziness.  She denies known trauma to her person or known sick contacts.  She denies being exposed to the flu or any viral infection that she knows of.  She endorses nausea and vomiting,  vomiting up food this morning.  She reports that she had just vomited prior to me coming into the room and that now she feels hungry and would like to eat however she is also feeling dizzy.  She reports her urine is smelly and very dark in color which she has not noticed before.  She reports she is a water drinker and drinks a lot of water.  Social history: She lives at home with her 3 other children.  She denies tobacco, EtOH, recreational drug use.  ROS: Constitutional: no weight change, no fever ENT/Mouth: no sore throat, no rhinorrhea Eyes: no eye pain, no vision changes Cardiovascular: no chest pain, no dyspnea,  no edema, no palpitations Respiratory: no cough, no sputum, no wheezing Gastrointestinal: + nausea, + vomiting, no diarrhea, no constipation Genitourinary: no urinary incontinence, no dysuria, no hematuria, + dark urine with abnormal swell Musculoskeletal: no arthralgias, no myalgias Skin: no skin lesions, no pruritus, Neuro: + weakness, no loss of consciousness, no syncope Psych: no anxiety, no depression, + decrease appetite Heme/Lymph: no bruising, no bleeding  ED Course: Discussed with EDP, patient requiring hospitalization for chief concerns of hemolyzing anemia.  Assessment/Plan  Principal Problem:   Symptomatic anemia Active Problems:   Tobacco abuse   Macrocytic anemia   DOE (dyspnea on exertion)   Pulmonary HTN (HCC)   Iron deficiency   Hypokalemia   Morbid obesity (HCC)   Hepatic steatosis   Autoimmune hemolytic anemia, unspecified (HCC)   At risk for hyperglycemia   Assessment and Plan:  * Symptomatic anemia Suspect active severe autoimmune hemolysis Hematologist recommended to EDP 1 mg/kg IV Solu-Medrol Sodium 100 mg IV one-time dose.  EDP Patient is this post type and screen and  2 units PRBC has been ordered for transfusion by EDP Nursing order/instruction: Please obtain and send repeat hemoglobin/hematocrit after completion of 2 units of  PRBC Recheck CBC in the a.m. with differentials Hematologist consultation placed to Dr. Donneta Romberg who is aware and will see the patient Further Solu-Medrol/IVIG recommendation pending hematology specialist recommendation  At risk for hyperglycemia In setting of steroid use Insulin SSI with at bedtime coverage, steroid dosing ordered on admission, however patient was concern the steroid dosing units was too high as she has ever had insulin before Insulin SSI with hs coverage, changed to renal dosing ordered Goal inpatient blood glucose levels 140-180  Autoimmune hemolytic anemia, unspecified (HCC) DAT negative Steroid responsive Continue follow-up by outpatient hematologist  Morbid obesity (HCC) This complicates overall care and prognosis.   Hypokalemia On 10 AM day of admission outpatient lab On recheck potassium normalized Check serum magnesium level on admission and will replace as appropriate  Chart reviewed.   DVT prophylaxis: TED hose Code Status: Full code Diet: Carb modified  Family Communication: Updated daughter at bedside with patient's permission Disposition Plan: Pending clinical course Consults called: Hematologist, Dr. Donneta Romberg via secure chat and Epic order Admission status: PCU, inpatient  Past Medical History:  Diagnosis Date   Anemia    Depression    Hepatic steatosis 04/01/2023   Past Surgical History:  Procedure Laterality Date   ABDOMINAL HYSTERECTOMY     COLONOSCOPY WITH PROPOFOL N/A 05/12/2022   Procedure: COLONOSCOPY WITH PROPOFOL;  Surgeon: Midge Minium, MD;  Location: [facility] ENDOSCOPY;  Service: Endoscopy;  Laterality: N/A;   ESOPHAGOGASTRODUODENOSCOPY (EGD) WITH PROPOFOL N/A 05/12/2022   Procedure: ESOPHAGOGASTRODUODENOSCOPY (EGD) WITH PROPOFOL;  Surgeon: Midge Minium, MD;  Location: [facility] ENDOSCOPY;  Service: Endoscopy;  Laterality: N/A;   IR BONE MARROW BIOPSY & ASPIRATION  05/28/2022   TONSILLECTOMY     Social History:  reports that she quit  smoking about 10 months ago. Her smoking use included cigarettes. She started smoking about 25 years ago. She has a 12.5 pack-year smoking history. She has never used smokeless tobacco. She reports that she does not currently use alcohol. She reports that she does not use drugs.  Allergies  Allergen Reactions   Rituxan [Rituximab] Anaphylaxis   Family History  Problem Relation Age of Onset   Breast cancer Paternal Aunt    Family history: Family history reviewed and not pertinent.  Prior to Admission medications   Medication Sig Start Date End Date Taking? Authorizing Provider  albuterol (VENTOLIN HFA) 108 (90 Base) MCG/ACT inhaler Inhale 2 puffs into the lungs every 6 (six) hours as needed for wheezing or shortness of breath. 06/10/22   Earna Coder, MD  aluminum-magnesium hydroxide-simethicone (MAALOX) 200-200-20 MG/5ML SUSP Take 30 mLs by mouth 4 (four) times daily -  before meals and at bedtime. 03/31/23   Sharman Cheek, MD  famotidine (PEPCID) 20 MG tablet Take 1 tablet (20 mg total) by mouth 2 (two) times daily. 03/31/23   Sharman Cheek, MD  folic acid (FOLVITE) 1 MG tablet Take 1 tablet (1 mg total) by mouth daily. 11/07/22   Earna Coder, MD  metFORMIN (GLUCOPHAGE) 500 MG tablet Take 500 mg by mouth 2 (two) times daily with a meal. Patient not taking: Reported on 04/01/2023    [provider]  mycophenolate (CELLCEPT) 500 MG tablet Take 1 tablet (500 mg total) by mouth 2 (two) times daily. 02/04/23   Earna Coder, MD  Semaglutide-Weight Management (WEGOVY) 0.25 MG/0.5ML SOAJ Inject 0.25 mg into  the skin once a week.    [provider]  venlafaxine XR (EFFEXOR-XR) 37.5 MG 24 hr capsule Take 37.5 mg by mouth daily. 08/07/22   [provider]   Physical Exam: Vitals:   04/01/23 1700 04/01/23 1730 04/01/23 1746 04/01/23 1820  BP: 109/69 110/70 110/70 114/70  Pulse: (!) 113 (!) 111 (!) 111 (!) 110  Resp: (!) 21 (!) 22 20 18   Temp:    98.2 F (36.8 C) 97.9 F (36.6 C)  TempSrc:   Oral Oral  SpO2: 98% 99% 100% 100%  Weight:      Height:       Constitutional: appears older than chronological age, acutely uncomfortable, frail Eyes: PERRL, lids and conjunctivae normal ENMT: Mucous membranes are moist. Posterior pharynx clear of any exudate or lesions. Age-appropriate dentition. Hearing appropriate Neck: normal, supple, no masses, no thyromegaly Respiratory: clear to auscultation bilaterally, no wheezing, no crackles. Normal respiratory effort. No accessory muscle use.  Cardiovascular: Regular rate and rhythm, no murmurs / rubs / gallops. No extremity edema. 2+ pedal pulses. No carotid bruits.  Abdomen: Obese abdomen, no tenderness, no masses palpated, no hepatosplenomegaly. Bowel sounds positive.  Musculoskeletal: no clubbing / cyanosis. No joint deformity upper and lower extremities. Good ROM, no contractures, no atrophy. Normal muscle tone.  Skin: no rashes, lesions, ulcers. No induration Neurologic: Sensation intact. Strength 5/5 in all 4.  Psychiatric: Normal judgment and insight. Alert and oriented x 3.  Anxious mood.   EKG: independently reviewed, showing sinus tachycardia with rate of 115, QTc 447  Chest x-ray on Admission: I personally reviewed CXR on 03/30/33 that was done in the ED and I agree with radiologist reading as below.  US ABDOMEN LIMITED RUQ (LIVER/GB) Result Date: 03/31/2023 CLINICAL DATA:  Epigastric pain. EXAM: ULTRASOUND ABDOMEN LIMITED RIGHT UPPER QUADRANT COMPARISON:  05/19/2022 FINDINGS: Gallbladder: No gallstones or gallbladder wall thickening. No pericholecystic fluid. The sonographer reports no sonographic Murphy's sign. Common bile duct: Diameter: 3 mm Liver: Diffuse coarsening of liver parenchyma with decreased through transmission suggests fatty deposition. No focal abnormality within the liver parenchyma. Portal vein is patent on color Doppler imaging with normal direction of blood flow towards  the liver. Other: None. IMPRESSION: 1. No acute findings. 2. Increased echogenicity of liver parenchyma suggests steatosis. Electronically Signed   By: Kennith Center M.D.   On: 03/31/2023 05:53   DG Chest 1 View Result Date: 03/31/2023 CLINICAL DATA:  Chest pain EXAM: PORTABLE CHEST 1 VIEW COMPARISON:  11/07/2022 FINDINGS: The heart size and mediastinal contours are within normal limits. Both lungs are clear. The visualized skeletal structures are unremarkable. IMPRESSION: No active disease. Electronically Signed   By: Alcide Clever M.D.   On: 03/31/2023 02:42   Labs on Admission: I have personally reviewed following labs  CBC: Recent Labs  Lab 03/31/23 0111 04/01/23 1006 04/01/23 1157  WBC 4.7 3.2* 3.2*  NEUTROABS  --  2.1  --   HGB 7.4* 6.8* 6.6*  HCT 20.4* 18.9* 17.7*  MCV 114.0* 113.9* 112.7*  PLT 117* 133* 95*   Basic Metabolic Panel: Recent Labs  Lab 03/31/23 0111 04/01/23 1006 04/01/23 1157  NA 136 133* 136  K 3.4* 3.1* 3.5  CL 102 100 106  CO2 18* 22 21*  GLUCOSE 145* 138* 153*  BUN 13 10 11   CREATININE 0.69 0.67 0.66  CALCIUM 9.6 8.8* 9.3  MG  --   --  1.9   GFR: Estimated Creatinine Clearance: 96.5 mL/min (by C-G formula based  on SCr of 0.66 mg/dL).  Liver Function Tests: Recent Labs  Lab 03/31/23 0426  AST 51*  ALT 28  ALKPHOS 53  BILITOT 2.9*  PROT 7.1  ALBUMIN 4.1   Recent Labs  Lab 03/31/23 0426  LIPASE 41   Coagulation Profile: Recent Labs  Lab 04/01/23 1157  INR 1.1   Anemia Panel: Recent Labs    04/01/23 1006  RETICCTPCT 3.5*   Urine analysis:    Component Value Date/Time   COLORURINE YELLOW (A) 05/11/2022 0232   APPEARANCEUR CLEAR (A) 05/11/2022 0232   LABSPEC 1.024 05/11/2022 0232   PHURINE 5.0 05/11/2022 0232   GLUCOSEU NEGATIVE 05/11/2022 0232   HGBUR SMALL (A) 05/11/2022 0232   BILIRUBINUR NEGATIVE 05/11/2022 0232   KETONESUR NEGATIVE 05/11/2022 0232   PROTEINUR NEGATIVE 05/11/2022 0232   UROBILINOGEN 0.2 08/28/2008  0014   NITRITE NEGATIVE 05/11/2022 0232   LEUKOCYTESUR NEGATIVE 05/11/2022 0232   CRITICAL CARE Performed by: Dr. Sedalia Muta  Total critical care time: 32 minutes  Critical care time was exclusive of separately billable procedures and treating other patients.  Critical care was necessary to treat or prevent imminent or life-threatening deterioration.  Critical care was time spent personally by me on the following activities: development of treatment plan with patient, as well as nursing, discussions with consultants, evaluation of patient's response to treatment, examination of patient, obtaining history from patient or surrogate, ordering and performing treatments and interventions, ordering and review of laboratory studies, ordering and review of radiographic studies, pulse oximetry and re-evaluation of patient's condition.  This document was prepared using Dragon Voice Recognition software and may include unintentional dictation errors.  Dr. Sedalia Muta [facility]  If 7PM-7AM, please contact overnight-coverage provider If 7AM-7PM, please contact day attending provider [email or web address]  04/01/2023, 6:40 PM

## 2023-04-01 NOTE — Progress Notes (Signed)
 Arrived to 2A room 233 from Emergency Department. Placed on cardiac monitoring and vital signs obtained.     04/01/23 1820  Vitals  Temp 97.9 F (36.6 C)  Temp Source Oral  BP 114/70  MAP (mmHg) 83  BP Location Right Arm  BP Method Automatic  Patient Position (if appropriate) Sitting  Pulse Rate (!) 110  Pulse Rate Source Monitor  Resp 18  MEWS COLOR  MEWS Score Color Green  Oxygen Therapy  SpO2 100 %  O2 Device Room Air  ECG Monitoring  Telemetry Box Number 2A [id number]  Tele Box Verification Completed by Second Verifier Completed (Megan NT)  MEWS Score  MEWS Temp 0  MEWS Systolic 0  MEWS Pulse 1  MEWS RR 0  MEWS LOC 0  MEWS Score 1

## 2023-04-01 NOTE — Progress Notes (Signed)
   04/01/23 1942  Assess: MEWS Score  Temp 98.3 F (36.8 C)  BP 93/63  MAP (mmHg) 74  Pulse Rate (!) 110  Resp 18  SpO2 100 %  O2 Device Room Air  Assess: MEWS Score  MEWS Temp 0  MEWS Systolic 1  MEWS Pulse 1  MEWS RR 0  MEWS LOC 0  MEWS Score 2  MEWS Score Color Yellow  Assess: if the MEWS score is Yellow or Red  Were vital signs accurate and taken at a resting state? No, vital signs rechecked  Assess: SIRS CRITERIA  SIRS Temperature  0  SIRS Respirations  0  SIRS Pulse 1  SIRS WBC 0  SIRS Score Sum  1   Vitals retaken

## 2023-04-01 NOTE — Assessment & Plan Note (Addendum)
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  # Patient currently OFF prednisone [ stopped/tapered OFF 10 mg a day] x2 months. Also on mycophenolate 500 mg twice daily-, however today Hb 6.8- quite symptomatic- recommend urgent evaluation in ER/ hospitalization. Plan IV steroids/ Blood transfusion. Consider IVIG. Patient poor candidate for Rituximab infusion given G-4 reaction.   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  Stable.   # Hemorroidal bleeding/constipation- continue miralax.   # Vaccinations: Flu host [oct 2024].   # DISPOSITION: # plan ER today-  #  follow up TBD-Dr.B  Discussed with Dr. Sedalia Muta- pt will be admitted to hospital. Will follow up in AM-re: IV solumderol/IVIG-  GB

## 2023-04-01 NOTE — ED Notes (Signed)
 Called CCMD for central monitoring

## 2023-04-01 NOTE — Assessment & Plan Note (Signed)
 DAT negative Steroid responsive Continue follow-up by outpatient hematologist

## 2023-04-01 NOTE — Assessment & Plan Note (Addendum)
 In setting of steroid use Insulin SSI with at bedtime coverage, steroid dosing ordered on admission, however patient was concern the steroid dosing units was too high as she has ever had insulin before Insulin SSI with hs coverage, changed to renal dosing ordered Goal inpatient blood glucose levels 140-180

## 2023-04-01 NOTE — Hospital Course (Signed)
 Ms. 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    Patient was sent from outpatient hematology/oncology clinic.  Vitals in the ED showed Tmax of 100, respiration rate of 18, heart rate 127 and on repeat was 103, blood pressure 101/55, SpO2 100% on room air.  Serum sodium is 136, potassium 3.5, chloride 106, bicarb 21, BUN 11, serum creatinine 0.66, EGFR greater than 60, nonfasting blood glucose 153.  ED treatment: Solu-Medrol 100 mg IV one-time dose, sodium chloride 500 mL liter bolus.  Patient has been typed and crossed.  2 units PRBC has been ordered for transfusion.

## 2023-04-01 NOTE — ED Triage Notes (Addendum)
 First nurse note: pt from cancer center for hemolyzing anemia. Hgb 6.8. LDH >2000 Hypotensive in cancer center.

## 2023-04-01 NOTE — ED Provider Notes (Signed)
 [facility] Provider Note    Event Date/Time   First MD Initiated Contact with Patient 04/01/23 1138     (approximate)   History   abnormal labs   HPI  Danielle [name] is a 48 y.o. female sent from hematology for anemia.  Patient has a history of autoimmune anemia, she is feeling dizzy and lightheaded with elevated heart rate.  Hemoglobin was found to be 6.8 today, her baseline is typically around 10.  Stools are normal.     Physical Exam   Triage Vital Signs: ED Triage Vitals  Encounter Vitals Group     BP 04/01/23 1141 (!) 101/55     Systolic BP Percentile --      Diastolic BP Percentile --      Pulse Rate 04/01/23 1141 (!) 118     Resp 04/01/23 1141 (!) 33     Temp 04/01/23 1141 100 F (37.8 C)     Temp Source 04/01/23 1141 Oral     SpO2 04/01/23 1141 100 %     Weight --      Height --      Head Circumference --      Peak Flow --      Pain Score 04/01/23 1135 7     Pain Loc --      Pain Education --      Exclude from Growth Chart --     Most recent vital signs: Vitals:   04/01/23 1141  BP: (!) 101/55  Pulse: (!) 118  Resp: (!) 33  Temp: 100 F (37.8 C)  SpO2: 100%     General: Awake, CV:  Good peripheral perfusion.  Tachycardia Resp:  Normal effort.  Abd:  No distention.  Soft, nontender Other:     ED Results / Procedures / Treatments   Labs (all labs ordered are listed, but only abnormal results are displayed) Labs Reviewed  RESP PANEL BY RT-PCR (RSV, FLU A&B, COVID)  [id number]  CBC  APTT  PROTIME-INR  BASIC METABOLIC PANEL  PREPARE RBC (CROSSMATCH)  TYPE AND SCREEN     EKG  ED ECG REPORT I, Jene Every, the attending physician, personally viewed and interpreted this ECG.  Date: 04/01/2023  Rhythm: Sinus tachycardia QRS Axis: normal Intervals: normal ST/T Wave abnormalities: normal Narrative Interpretation: no evidence of acute ischemia    RADIOLOGY Chest x-ray reviewed from yesterday, no  pneumonia    PROCEDURES:  Critical Care performed: yes  CRITICAL CARE Performed by: Jene Every   Total critical care time: 30 minutes  Critical care time was exclusive of separately billable procedures and treating other patients.  Critical care was necessary to treat or prevent imminent or life-threatening deterioration.  Critical care was time spent personally by me on the following activities: development of treatment plan with patient and/or surrogate as well as nursing, discussions with consultants, evaluation of patient's response to treatment, examination of patient, obtaining history from patient or surrogate, ordering and performing treatments and interventions, ordering and review of laboratory studies, ordering and review of radiographic studies, pulse oximetry and re-evaluation of patient's condition.   Procedures   MEDICATIONS ORDERED IN ED: Medications  0.9 %  sodium chloride infusion (has no administration in time range)  methylPREDNISolone sodium succinate (SOLU-MEDROL) 125 mg/2 mL injection 100 mg (100 mg Intravenous Given 04/01/23 1202)  sodium chloride 0.9 % bolus 500 mL (500 mLs Intravenous New Bag/Given 04/01/23 1202)     IMPRESSION / MDM / ASSESSMENT AND PLAN /  ED COURSE  I reviewed the triage vital signs and the nursing notes. Patient's presentation is most consistent with severe exacerbation of chronic illness.  Patient presents with tachycardia, lightheadedness and hemoglobin less than 7.  She is also mildly tachypneic and her temperature is 100.  Question viral illness such as influenza versus COVID causing activation of autoimmune anemia.  LDH is 2000 consistent with hemolytic anemia  Will transfuse 2 units per Dr. Donneta Romberg and give 1 mg/kg Solu-Medrol  Patient will require admission, have discussed with the hospitalist.        FINAL CLINICAL IMPRESSION(S) / ED DIAGNOSES   Final diagnoses:  Autoimmune hemolytic anemia (HCC)     Rx /  DC Orders   ED Discharge Orders     None        Note:  This document was prepared using Dragon voice recognition software and may include unintentional dictation errors.   Jene Every, MD 04/01/23 [phone]

## 2023-04-01 NOTE — Assessment & Plan Note (Addendum)
 Suspect active severe autoimmune hemolysis Hematologist recommended to EDP 1 mg/kg IV Solu-Medrol Sodium 100 mg IV one-time dose.  EDP Patient is this post type and screen and 2 units PRBC has been ordered for transfusion by EDP Nursing order/instruction: Please obtain and send repeat hemoglobin/hematocrit after completion of 2 units of PRBC Recheck CBC in the a.m. with differentials Hematologist consultation placed to Dr. Donneta Romberg who is aware and will see the patient Further Solu-Medrol/IVIG recommendation pending hematology specialist recommendation

## 2023-04-01 NOTE — Progress Notes (Signed)
 Fatigue/weakness:  YES Dyspena: YES when walking Light headedness: YES Blood in stool: YES hemorrhoids   Was d/c from [facility] ED yesterday, dizziness x2 weeks. She states heart rate has been going up.  C/O Left neck/shoulder pain, 8/10.  03/31/23 U/S Abdomen, chest xray.  Pt states urine is very dark, and has a smell, no pain. States she has been holding her urine.  Appetite 25% normal,  no supplement drinks. Having nausea and vomiting. Burping and food comes up.

## 2023-04-01 NOTE — Assessment & Plan Note (Signed)
 -  This complicates overall care and prognosis.

## 2023-04-01 NOTE — Progress Notes (Signed)
 Critical lab called by Selena Batten in lab; Hemoglobin 6.8 Read back. Dr Donneta Romberg notified. Per Dr Donneta Romberg request patient was transported to the ED. Pt is very weak, having pain. Her hemoglobin is hemolyzing. Spoke to nurse in ED with a report.

## 2023-04-01 NOTE — Assessment & Plan Note (Signed)
 On 10 AM day of admission outpatient lab On recheck potassium normalized Check serum magnesium level on admission and will replace as appropriate

## 2023-04-02 DIAGNOSIS — D649 Anemia, unspecified: Secondary | ICD-10-CM | POA: Diagnosis not present

## 2023-04-02 LAB — HEPATIC FUNCTION PANEL
ALT: 26 U/L (ref 0–44)
AST: 47 U/L — ABNORMAL HIGH (ref 15–41)
Albumin: 3.8 g/dL (ref 3.5–5.0)
Alkaline Phosphatase: 47 U/L (ref 38–126)
Bilirubin, Direct: 0.4 mg/dL — ABNORMAL HIGH (ref 0.0–0.2)
Indirect Bilirubin: 1.9 mg/dL — ABNORMAL HIGH (ref 0.3–0.9)
Total Bilirubin: 2.3 mg/dL — ABNORMAL HIGH (ref 0.0–1.2)
Total Protein: 6.5 g/dL (ref 6.5–8.1)

## 2023-04-02 LAB — BASIC METABOLIC PANEL
Anion gap: 4 — ABNORMAL LOW (ref 5–15)
BUN: 10 mg/dL (ref 6–20)
CO2: 23 mmol/L (ref 22–32)
Calcium: 9 mg/dL (ref 8.9–10.3)
Chloride: 108 mmol/L (ref 98–111)
Creatinine, Ser: 0.6 mg/dL (ref 0.44–1.00)
GFR, Estimated: >60 mL/min (ref >60)
Glucose, Bld: 136 mg/dL — ABNORMAL HIGH (ref 70–99)
Potassium: 3.8 mmol/L (ref 3.5–5.1)
Sodium: 135 mmol/L (ref 135–145)

## 2023-04-02 LAB — CBC WITH DIFFERENTIAL/PLATELET
Abs Immature Granulocytes: 0.06 10*3/uL (ref 0.00–0.07)
Basophils Absolute: 0 10*3/uL (ref 0.0–0.1)
Basophils Relative: 0 %
Eosinophils Absolute: 0 10*3/uL (ref 0.0–0.5)
Eosinophils Relative: 0 %
HCT: 22.4 % — ABNORMAL LOW (ref 36.0–46.0)
Hemoglobin: 8.1 g/dL — ABNORMAL LOW (ref 12.0–15.0)
Immature Granulocytes: 2 %
Lymphocytes Relative: 25 %
Lymphs Abs: 0.8 10*3/uL (ref 0.7–4.0)
MCH: 36.3 pg — ABNORMAL HIGH (ref 26.0–34.0)
MCHC: 36.2 g/dL — ABNORMAL HIGH (ref 30.0–36.0)
MCV: 100.4 fL — ABNORMAL HIGH (ref 80.0–100.0)
Monocytes Absolute: 0.1 10*3/uL (ref 0.1–1.0)
Monocytes Relative: 3 %
Neutro Abs: 2.4 10*3/uL (ref 1.7–7.7)
Neutrophils Relative %: 70 %
Platelets: 86 10*3/uL — ABNORMAL LOW (ref 150–400)
RBC: 2.23 MIL/uL — ABNORMAL LOW (ref 3.87–5.11)
Smear Review: NORMAL
WBC: 3.3 10*3/uL — ABNORMAL LOW (ref 4.0–10.5)
nRBC: 0.9 % — ABNORMAL HIGH (ref 0.0–0.2)

## 2023-04-02 LAB — RETICULOCYTES
Immature Retic Fract: 16.6 % — ABNORMAL HIGH (ref 2.3–15.9)
RBC.: 2.22 MIL/uL — ABNORMAL LOW (ref 3.87–5.11)
Retic Count, Absolute: 59.5 10*3/uL (ref 19.0–186.0)
Retic Ct Pct: 2.7 % (ref 0.4–3.1)

## 2023-04-02 LAB — HEMOGLOBIN AND HEMATOCRIT, BLOOD
HCT: 22.7 % — ABNORMAL LOW (ref 36.0–46.0)
HCT: 22.7 % — ABNORMAL LOW (ref 36.0–46.0)
Hemoglobin: 8.2 g/dL — ABNORMAL LOW (ref 12.0–15.0)
Hemoglobin: 8.3 g/dL — ABNORMAL LOW (ref 12.0–15.0)

## 2023-04-02 LAB — GLUCOSE, CAPILLARY
Glucose-Capillary: 99 mg/dL (ref 70–99)
Glucose-Capillary: 100 mg/dL — ABNORMAL HIGH (ref 70–99)
Glucose-Capillary: 133 mg/dL — ABNORMAL HIGH (ref 70–99)
Glucose-Capillary: 180 mg/dL — ABNORMAL HIGH (ref 70–99)
Glucose-Capillary: 212 mg/dL — ABNORMAL HIGH (ref 70–99)

## 2023-04-02 LAB — IRON AND TIBC
Iron: 212 ug/dL — ABNORMAL HIGH (ref 28–170)
Saturation Ratios: 75 % — ABNORMAL HIGH (ref 10.4–31.8)
TIBC: 281 ug/dL (ref 250–450)
UIBC: 69 ug/dL

## 2023-04-02 LAB — MAGNESIUM: Magnesium: 2.2 mg/dL (ref 1.7–2.4)

## 2023-04-02 LAB — FERRITIN: Ferritin: 293 ng/mL (ref 11–307)

## 2023-04-02 LAB — MRSA NEXT GEN BY PCR, NASAL: MRSA by PCR Next Gen: NOT DETECTED

## 2023-04-02 LAB — LACTATE DEHYDROGENASE: LDH: 2106 U/L — ABNORMAL HIGH (ref 98–192)

## 2023-04-02 LAB — PHOSPHORUS: Phosphorus: 2.7 mg/dL (ref 2.5–4.6)

## 2023-04-02 MED ORDER — EPINEPHRINE 0.3 MG/0.3ML IJ SOAJ
0.3000 mg | Freq: Once | INTRAMUSCULAR | Status: AC
  Administered 2023-04-02: 0.3 mg via INTRAMUSCULAR
  Filled 2023-04-02: qty 0.3

## 2023-04-02 MED ORDER — EPINEPHRINE PF 1 MG/ML IJ SOLN
INTRAMUSCULAR | Status: AC
  Administered 2023-04-02: 0.3 mg via INTRAMUSCULAR
  Filled 2023-04-02: qty 1

## 2023-04-02 MED ORDER — ALUM & MAG HYDROXIDE-SIMETH 200-200-20 MG/5ML PO SUSP
30.0000 mL | ORAL | Status: DC | PRN
  Administered 2023-04-02 – 2023-04-09 (×5): 30 mL via ORAL
  Filled 2023-04-02 (×5): qty 30

## 2023-04-02 MED ORDER — IMMUNE GLOBULIN (HUMAN) 10 GM/100ML IV SOLN: 400.0000 mg/kg | INTRAVENOUS | Status: DC

## 2023-04-02 MED ORDER — PANTOPRAZOLE SODIUM 40 MG PO TBEC
40.0000 mg | DELAYED_RELEASE_TABLET | Freq: Two times a day (BID) | ORAL | Status: DC
  Administered 2023-04-02 – 2023-04-07 (×11): 40 mg via ORAL
  Filled 2023-04-02 (×11): qty 1

## 2023-04-02 MED ORDER — MORPHINE SULFATE (PF) 2 MG/ML IV SOLN
2.0000 mg | INTRAVENOUS | Status: DC | PRN
  Administered 2023-04-02 – 2023-04-05 (×6): 2 mg via INTRAVENOUS
  Filled 2023-04-02 (×6): qty 1

## 2023-04-02 MED ORDER — SODIUM CHLORIDE 0.9 % IV SOLN
250.0000 mg | INTRAVENOUS | Status: DC
  Administered 2023-04-02: 250 mg via INTRAVENOUS
  Filled 2023-04-02 (×2): qty 250

## 2023-04-02 MED ORDER — DIPHENHYDRAMINE HCL 50 MG/ML IJ SOLN
INTRAMUSCULAR | Status: AC
  Administered 2023-04-02: 25 mg via INTRAVENOUS
  Filled 2023-04-02: qty 1

## 2023-04-02 MED ORDER — BUTALBITAL-APAP-CAFFEINE 50-325-40 MG PO TABS: 1.0000 | ORAL_TABLET | Freq: Four times a day (QID) | ORAL | Status: DC | PRN

## 2023-04-02 MED ORDER — MIDODRINE HCL 5 MG PO TABS: 5.0000 mg | ORAL_TABLET | Freq: Three times a day (TID) | ORAL | Status: DC

## 2023-04-02 MED ORDER — MYCOPHENOLATE MOFETIL 250 MG PO CAPS
1000.0000 mg | ORAL_CAPSULE | Freq: Two times a day (BID) | ORAL | Status: DC
Start: 2023-04-02 — End: 2023-04-07
  Administered 2023-04-02 – 2023-04-07 (×11): 1000 mg via ORAL
  Filled 2023-04-02 (×11): qty 4

## 2023-04-02 MED ORDER — DIPHENHYDRAMINE HCL 50 MG/ML IJ SOLN: 25.0000 mg | Freq: Once | INTRAMUSCULAR | Status: AC

## 2023-04-02 MED ORDER — ENOXAPARIN SODIUM 40 MG/0.4ML IJ SOSY
40.0000 mg | PREFILLED_SYRINGE | INTRAMUSCULAR | Status: DC
Start: 2023-04-02 — End: 2023-04-05
  Administered 2023-04-02 – 2023-04-05 (×4): 40 mg via SUBCUTANEOUS
  Filled 2023-04-02 (×4): qty 0.4

## 2023-04-02 MED ORDER — IMMUNE GLOBULIN (HUMAN) 10 GM/100ML IV SOLN
400.0000 mg/kg | INTRAVENOUS | Status: DC
  Filled 2023-04-02: qty 300

## 2023-04-02 MED ORDER — EPINEPHRINE PF 1 MG/ML IJ SOLN: 0.3000 mg | Freq: Once | INTRAMUSCULAR | Status: AC

## 2023-04-02 MED ORDER — FAMOTIDINE IN NACL 20-0.9 MG/50ML-% IV SOLN
INTRAVENOUS | Status: AC
  Filled 2023-04-02: qty 50

## 2023-04-02 MED ORDER — LACTATED RINGERS IV BOLUS
500.0000 mL | Freq: Once | INTRAVENOUS | Status: AC
  Administered 2023-04-02: 500 mL via INTRAVENOUS

## 2023-04-02 MED ORDER — BUTALBITAL-APAP-CAFFEINE 50-325-40 MG PO TABS
1.0000 | ORAL_TABLET | Freq: Once | ORAL | Status: AC
  Administered 2023-04-02: 1 via ORAL
  Filled 2023-04-02: qty 1

## 2023-04-02 MED ORDER — CHLORHEXIDINE GLUCONATE CLOTH 2 % EX PADS
6.0000 | MEDICATED_PAD | Freq: Every day | CUTANEOUS | Status: DC
  Administered 2023-04-02 – 2023-04-03 (×2): 6 via TOPICAL

## 2023-04-02 MED ORDER — SODIUM CHLORIDE 0.9 % IV BOLUS
500.0000 mL | Freq: Once | INTRAVENOUS | Status: AC
  Administered 2023-04-02: 500 mL via INTRAVENOUS

## 2023-04-02 NOTE — Plan of Care (Signed)

## 2023-04-02 NOTE — Significant Event (Signed)
 Pt continued to complain of respiratory distress. Code blue called. Staff arrived. No interventions needed. Pt will transfer to ICU/sdu level of care.

## 2023-04-02 NOTE — Progress Notes (Signed)
 Transition of Care [facility]) - Inpatient Brief Assessment   Patient Details  Name: Danielle [name] MRN: [id number] Date of Birth: [birth year]  Transition of Care [facility]) CM/SW Contact:    Truddie Hidden, RN Phone Number: 04/02/2023, 10:58 AM   Clinical Narrative: TOC continuing to follow patient's progress throughout discharge planning.   Transition of Care Asessment: Insurance and Status: Insurance coverage has been reviewed Patient has primary care physician: Yes   Prior level of function:: Independent Prior/Current Home Services: No current home services Social Drivers of Health Review: SDOH reviewed no interventions necessary Readmission risk has been reviewed: Yes Transition of care needs: no transition of care needs at this time

## 2023-04-02 NOTE — Progress Notes (Signed)
 Responded to rapid response page. RR RN pt RN present. Pt talking, maintaining airway has pulse. Had been given IV solumedrol. Increased HR, c/o feeling hot.

## 2023-04-02 NOTE — Progress Notes (Signed)
 [facility] Progress Note  Patient: Danielle [name]    YQI:[id number]  DOA: 04/01/2023     Date of Service: the patient was seen and examined on 04/02/2023  Chief Complaint  Patient presents with   abnormal labs   Brief hospital course: Ms. 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     Patient was sent from outpatient hematology/oncology clinic.   Vitals in the ED showed Tmax of 100, respiration rate of 18, heart rate 127 and on repeat was 103, blood pressure 101/55, SpO2 100% on room air.   Serum sodium is 136, potassium 3.5, chloride 106, bicarb 21, BUN 11, serum creatinine 0.66, EGFR greater than 60, nonfasting blood glucose 153.   ED treatment: Solu-Medrol 100 mg IV one-time dose, sodium chloride 500 mL liter bolus.  Patient has been typed and crossed.  2 units PRBC has been ordered for transfusion.   Assessment and Plan:  Severe autoimmune hemolytic anemia Presented with symptomatic anemia, Hb 6.8 Elevated LDH level and indirect bilirubin due to hemolysis DAT negative Steroid responsive S/p 2 unit of PRBC transfusion given on 3/5 S/p Solu-Medrol IV 100 mg x 1 dose given on 3/5, tolerated well with no reaction Hb 8.1 in am  S/p IV Solu-Medrol 250 mg time 1 dose given and patient developed anaphylactic reaction, choking sensation, difficulty breathing which resolved after EpiPen.  Patient also did Benadryl 50 mg IV Patient was seen by heme-onc, recommended Solu-Medrol to 40 mg IV daily and IV IgG but patient developed anaphylactic reaction so discontinued IV Solu-Medrol and postponed IV IgG for now. Patient was transferred to stepdown unit for close monitoring. We will follow heme-onc for further recommendation  tomorrow a.m. 3/6 started mycophenolate 1000 mg p.o. twice daily  Anaphylactic reaction developed soon after receiving Solu-Medrol IV  250 mg on 3/6 Patient received Benadryl 50 mg, anaphylactic reaction resolved after receiving EpiPen IM injection Patient also received Fioricet one dose and chlorhexidine pads at the same time as per pharmacy Continue to monitor vital signs closely  Hypokalemia, resolved Monitor electrolytes   Hyperglycemia most likely due to steroids Continue diabetic diet Continue NovoLog sliding scale Monitor CBG   Obesity, class II Body mass index is 38.12 kg/m.  Interventions:  Diet: Carb modified diet DVT Prophylaxis: Subcutaneous Lovenox   Advance goals of care discussion: Full code  Family Communication: family was present at bedside, at the time of interview.  The pt provided permission to discuss medical plan with the family. Opportunity was given to ask question and all questions were answered satisfactorily.   Disposition:  Pt is from Home, admitted with autoimmune hemolytic anemia, still has risk of low hemoglobin, which precludes a safe discharge. Discharge to ome, when cleared by hematologist.  Subjective: No significant events overnight, in the morning time patient was complaining of headache denied any other complaints.  The patient received a dose of Fioricet for headache and Solu-Medrol then she developed anaphylactic reaction, which resolved after EpiPen given.  Patient was transferred to stepdown unit.  Vitals stable.  We will continue to monitor closely.   Physical Exam: General: NAD, lying comfortably Appear in no distress, affect appropriate Eyes: PERRLA ENT: Oral Mucosa Clear, moist  Neck: no JVD,  Cardiovascular: S1 and S2 Present, no Murmur,  Respiratory: good respiratory effort, Bilateral Air  entry equal and Decreased, no Crackles, no wheezes Abdomen: Bowel Sound present, Soft and no tenderness,  Skin: no rashes Extremities:  no Pedal edema, no calf tenderness Neurologic: without any new focal findings Gait not checked due to patient safety concerns  Vitals:   04/02/23 1232 04/02/23 1245 04/02/23 1300 04/02/23 1437  BP: 122/69 119/60 (!) 113/54 114/60  Pulse: 93 89 92 86  Resp:  (!) 22 20 (!) 21  Temp:  97.8 F (36.6 C)    TempSrc:  Oral    SpO2: 100% 100% 100% 100%  Weight:  97.6 kg    Height:  5\' 3"  (1.6 m)      Intake/Output Summary (Last 24 hours) at 04/02/2023 1543 Last data filed at 04/02/2023 1500 Gross per 24 hour  Intake 2671.08 ml  Output 400 ml  Net 2271.08 ml   Filed Weights   04/01/23 1300 04/02/23 1245  Weight: 97.1 kg 97.6 kg    Data Reviewed: I have personally reviewed and interpreted daily labs, tele strips, imagings as discussed above. I reviewed all nursing notes, pharmacy notes, vitals, pertinent old records I have discussed plan of care as described above with RN and patient/family.  CBC: Recent Labs  Lab 03/31/23 0111 04/01/23 1006 04/01/23 1157 04/02/23 0011 04/02/23 0357  WBC 4.7 3.2* 3.2*  --  3.3*  NEUTROABS  --  2.1  --   --  2.4  HGB 7.4* 6.8* 6.6* 8.2* 8.1*  HCT 20.4* 18.9* 17.7* 22.7* 22.4*  MCV 114.0* 113.9* 112.7*  --  100.4*  PLT 117* 133* 95*  --  86*   Basic Metabolic Panel: Recent Labs  Lab 03/31/23 0111 04/01/23 1006 04/01/23 1157 04/02/23 0357  NA 136 133* 136 135  K 3.4* 3.1* 3.5 3.8  CL 102 100 106 108  CO2 18* 22 21* 23  GLUCOSE 145* 138* 153* 136*  BUN 13 10 11 10   CREATININE 0.69 0.67 0.66 0.60  CALCIUM 9.6 8.8* 9.3 9.0  MG  --   --  1.9 2.2  PHOS  --   --   --  2.7    Studies: No results found.  Scheduled Meds:  Chlorhexidine Gluconate Cloth  6 each Topical Daily   enoxaparin (LOVENOX) injection  40 mg Subcutaneous Q24H   folic acid  1 mg Oral Daily   insulin aspart  0-5 Units Subcutaneous QHS   insulin aspart  0-9 Units Subcutaneous TID WC   mycophenolate  1,000 mg Oral BID   pantoprazole  40 mg Oral BID AC   Continuous  Infusions:  famotidine     PRN Meds: acetaminophen **OR** acetaminophen, alum & mag hydroxide-simeth, butalbital-acetaminophen-caffeine, famotidine, morphine injection, ondansetron **OR** ondansetron (ZOFRAN) IV, senna-docusate  Time spent: 55 minutes  Author: Gillis Santa. MD [facility] 04/02/2023 3:43 PM  To reach On-call, see care teams to locate the attending and reach out to them via [email or web address]. If 7PM-7AM, please contact night-coverage If you still have difficulty reaching the attending provider, please page the [facility] (Director on Call) for [facility] on amion for assistance.

## 2023-04-02 NOTE — Progress Notes (Signed)
   04/02/23 1145  Spiritual Encounters  Type of Visit Initial  Care provided to: [facility] partners present during encounter Other (comment)  Reason for visit Code  OnCall Visit Yes   Chaplain responded to RRT to the [place] phone.  Patient's MIL and daughter present. Chaplain offered services to them while staff got patient stable.  There were no additional needs at this time.  Chaplain will follow-up as needed/requested by patient, family or staff.   Rev. Rana M. Earlene Plater, MDiv Chaplain Resident  [facility]

## 2023-04-02 NOTE — Consult Note (Addendum)
 [facility] CONSULT NOTE  Patient Care Team: Armando Gang, FNP as PCP - General (Family Medicine) Earna Coder, MD as Consulting Physician (Oncology)  CHIEF COMPLAINTS/PURPOSE OF CONSULTATION: Acute hemolytic anemia  HISTORY OF PRESENTING ILLNESS:  Danielle [name] 48 y.o.  female pleasant patient with a with a history of hemolytic anemia-DAT negative but steroid responsive is currently admitted to hospital for acute hemolysis.  Patient was seen in the clinic yesterday noted to have shortness of breath dizziness and also extreme fatigue.  Hemoglobin noted to be 6.8 LDH greater than 2000.  Patient evaluated in the emergency room status post units of PRBC transfusion.  Patient also received 100 mg of IV Solu-Medrol.  Patient complains of abdominal discomfort and also nausea this morning.  Otherwise energy level slightly improved she is able to walk to the bathroom.   Review of Systems  Constitutional:  Positive for malaise/fatigue. Negative for chills, diaphoresis, fever and weight loss.  HENT:  Negative for nosebleeds and sore throat.   Eyes:  Negative for double vision.  Respiratory:  Positive for shortness of breath. Negative for cough, hemoptysis and wheezing.   Cardiovascular:  Negative for chest pain, palpitations, orthopnea and leg swelling.  Gastrointestinal:  Negative for abdominal pain, blood in stool, constipation, diarrhea, heartburn, melena, nausea and vomiting.  Genitourinary:  Negative for dysuria, frequency and urgency.  Musculoskeletal:  Negative for back pain and joint pain.  Skin: Negative.  Negative for itching and rash.  Neurological:  Positive for dizziness. Negative for tingling, focal weakness, weakness and headaches.  Endo/Heme/Allergies:  Does not bruise/bleed easily.  Psychiatric/Behavioral:  Negative for depression. The patient is not nervous/anxious and does not have insomnia.     MEDICAL HISTORY:  Past Medical History:   Diagnosis Date   Anemia    Depression    Hepatic steatosis 04/01/2023    SURGICAL HISTORY: Past Surgical History:  Procedure Laterality Date   ABDOMINAL HYSTERECTOMY     COLONOSCOPY WITH PROPOFOL N/A 05/12/2022   Procedure: COLONOSCOPY WITH PROPOFOL;  Surgeon: Midge Minium, MD;  Location: [facility] ENDOSCOPY;  Service: Endoscopy;  Laterality: N/A;   ESOPHAGOGASTRODUODENOSCOPY (EGD) WITH PROPOFOL N/A 05/12/2022   Procedure: ESOPHAGOGASTRODUODENOSCOPY (EGD) WITH PROPOFOL;  Surgeon: Midge Minium, MD;  Location: [facility] ENDOSCOPY;  Service: Endoscopy;  Laterality: N/A;   IR BONE MARROW BIOPSY & ASPIRATION  05/28/2022   TONSILLECTOMY      SOCIAL HISTORY: Social History   Socioeconomic History   Marital status: Single    Spouse name: Not on file   Number of children: Not on file   Years of education: Not on file   Highest education level: Not on file  Occupational History   Not on file  Tobacco Use   Smoking status: Former    Current packs/day: 0.00    Average packs/day: 0.5 packs/day for 25.0 years (12.5 ttl pk-yrs)    Types: Cigarettes    Start date: 05/09/1997    Quit date: 05/10/2022    Years since quitting: 0.8   Smokeless tobacco: Never  Vaping Use   Vaping status: Never Used  Substance and Sexual Activity   Alcohol use: Not Currently   Drug use: Never   Sexual activity: Not Currently  Other Topics Concern   Not on file  Social History Narrative   Lives in [place] with 4 kids; stay at home [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    Social Drivers of Corporate investment banker Strain: Not on file  Food Insecurity: No Food Insecurity (04/01/2023)   Hunger Vital Sign    Worried About Running Out of Food in the Last Year: Never true    Ran Out of Food in the Last Year: Never true  Transportation Needs: No Transportation Needs (04/01/2023)   PRAPARE - Administrator, Civil Service (Medical): No    Lack of Transportation (Non-Medical): No  Physical  Activity: Not on file  Stress: Not on file  Social Connections: Unknown (04/01/2023)   Social Connection and Isolation Panel [NHANES]    Frequency of Communication with Friends and Family: More than three times a week    Frequency of Social Gatherings with Friends and Family: More than three times a week    Attends Religious Services: More than 4 times per year    Active Member of [employer] or Organizations: No    Attends Banker Meetings: Never    Marital Status: Patient declined  Catering manager Violence: Not At Risk (04/01/2023)   Humiliation, Afraid, Rape, and Kick questionnaire    Fear of Current or Ex-Partner: No    Emotionally Abused: No    Physically Abused: No    Sexually Abused: No    FAMILY HISTORY: Family History  Problem Relation Age of Onset   Breast cancer Paternal Aunt     ALLERGIES:  is allergic to rituxan [rituximab] and solu-medrol [methylprednisolone].  MEDICATIONS:  Current Facility-Administered Medications  Medication Dose Route Frequency Provider Last Rate Last Admin   acetaminophen (TYLENOL) tablet 650 mg  650 mg Oral Q6H PRN Cox, Amy N, DO   650 mg at 04/02/23 1454   Or   acetaminophen (TYLENOL) suppository 650 mg  650 mg Rectal Q6H PRN Cox, Amy N, DO       alum & mag hydroxide-simeth (MAALOX/MYLANTA) 200-200-20 MG/5ML suspension 30 mL  30 mL Oral Q4H PRN Cox, Amy N, DO   30 mL at 04/02/23 0042   butalbital-acetaminophen-caffeine (FIORICET) 50-325-40 MG per tablet 1 tablet  1 tablet Oral Q6H PRN Gillis Santa, MD       Chlorhexidine Gluconate Cloth 2 % PADS 6 each  6 each Topical Daily Gillis Santa, MD   6 each at 04/02/23 1321   enoxaparin (LOVENOX) injection 40 mg  40 mg Subcutaneous Q24H Louretta Shorten R, MD   40 mg at 04/02/23 0838   famotidine (PEPCID) 20-0.9 MG/50ML-% IVPB            folic acid (FOLVITE) tablet 1 mg  1 mg Oral Daily Louretta Shorten R, MD   1 mg at 04/02/23 0839   [START ON 04/04/2023] Immune Globulin 10% (PRIVIGEN) IV  infusion 30 g  400 mg/kg (Adjusted) Intravenous Q24 Hr x 5 Gillis Santa, MD       insulin aspart (novoLOG) injection 0-5 Units  0-5 Units Subcutaneous QHS Cox, Amy N, DO       insulin aspart (novoLOG) injection 0-9 Units  0-9 Units Subcutaneous TID WC Cox, Amy N, DO       morphine (PF) 2 MG/ML injection 2 mg  2 mg Intravenous Q4H PRN Jimmye Norman, NP   2 mg at 04/02/23 0159   ondansetron (ZOFRAN) tablet 4 mg  4 mg Oral Q6H PRN Cox, Amy N, DO       Or   ondansetron (ZOFRAN) injection 4 mg  4 mg Intravenous Q6H PRN Cox, Amy N, DO   4 mg at 04/02/23 0138   pantoprazole (PROTONIX) EC tablet 40 mg  40  mg Oral BID AC Louretta Shorten R, MD   40 mg at 04/02/23 0839   senna-docusate (Senokot-S) tablet 1 tablet  1 tablet Oral QHS PRN Cox, Amy N, DO        PHYSICAL EXAMINATION:   Vitals:   04/02/23 1300 04/02/23 1437  BP: (!) 113/54 114/60  Pulse: 92 86  Resp: 20 (!) 21  Temp:    SpO2: 100% 100%   Filed Weights   04/01/23 1300 04/02/23 1245  Weight: 214 lb (97.1 kg) 215 lb 2.7 oz (97.6 kg)    Physical Exam Vitals and nursing note reviewed.  HENT:     Head: Normocephalic and atraumatic.     Mouth/Throat:     Pharynx: Oropharynx is clear.  Eyes:     Extraocular Movements: Extraocular movements intact.     Pupils: Pupils are equal, round, and reactive to light.  Cardiovascular:     Rate and Rhythm: Normal rate and regular rhythm.  Pulmonary:     Comments: Decreased breath sounds bilaterally.  Abdominal:     Palpations: Abdomen is soft.  Musculoskeletal:        General: Normal range of motion.     Cervical back: Normal range of motion.  Skin:    General: Skin is warm.  Neurological:     General: No focal deficit present.     Mental Status: She is alert and oriented to person, place, and time.  Psychiatric:        Behavior: Behavior normal.        Judgment: Judgment normal.     LABORATORY DATA:  I have reviewed the data as listed Lab Results  Component Value  Date   WBC 3.3 (L) 04/02/2023   HGB 8.1 (L) 04/02/2023   HCT 22.4 (L) 04/02/2023   MCV 100.4 (H) 04/02/2023   PLT 86 (L) 04/02/2023   Recent Labs    05/11/22 0901 05/12/22 0458 10/31/22 0900 11/07/22 1242 03/31/23 0426 04/01/23 1006 04/01/23 1157 04/02/23 0357  NA 138   < > 138   < >  --  133* 136 135  K 3.7   < > 3.7   < >  --  3.1* 3.5 3.8  CL 107   < > 105   < >  --  100 106 108  CO2 25   < > 25   < >  --  22 21* 23  GLUCOSE 131*   < > 139*   < >  --  138* 153* 136*  BUN 9   < > 16   < >  --  10 11 10   CREATININE 0.53   < > 0.58   < >  --  0.67 0.66 0.60  CALCIUM 8.7*   < > 9.1   < >  --  8.8* 9.3 9.0  GFRNONAA >60   < > >60   < >  --  >60 >60 >60  PROT 6.3*   < > 7.1  --  7.1  --   --  6.5  ALBUMIN 3.3*   < > 4.0  --  4.1  --   --  3.8  AST 36   < > 18  --  51*  --   --  47*  ALT 25   < > 16  --  28  --   --  26  ALKPHOS 53   < > 68  --  53  --   --  47  BILITOT 1.9*   < > 1.2  --  2.9*  --   --  2.3*  BILIDIR 0.3*  --   --   --  0.5*  --   --  0.4*  IBILI 1.6*  --   --   --  2.4*  --   --  1.9*   < > = values in this interval not displayed.    RADIOGRAPHIC STUDIES: I have personally reviewed the radiological images as listed and agreed with the findings in the report. US ABDOMEN LIMITED RUQ (LIVER/GB) Result Date: 03/31/2023 CLINICAL DATA:  Epigastric pain. EXAM: ULTRASOUND ABDOMEN LIMITED RIGHT UPPER QUADRANT COMPARISON:  05/19/2022 FINDINGS: Gallbladder: No gallstones or gallbladder wall thickening. No pericholecystic fluid. The sonographer reports no sonographic Murphy's sign. Common bile duct: Diameter: 3 mm Liver: Diffuse coarsening of liver parenchyma with decreased through transmission suggests fatty deposition. No focal abnormality within the liver parenchyma. Portal vein is patent on color Doppler imaging with normal direction of blood flow towards the liver. Other: None. IMPRESSION: 1. No acute findings. 2. Increased echogenicity of liver parenchyma suggests  steatosis. Electronically Signed   By: Kennith Center M.D.   On: 03/31/2023 05:53   DG Chest 1 View Result Date: 03/31/2023 CLINICAL DATA:  Chest pain EXAM: PORTABLE CHEST 1 VIEW COMPARISON:  11/07/2022 FINDINGS: The heart size and mediastinal contours are within normal limits. Both lungs are clear. The visualized skeletal structures are unremarkable. IMPRESSION: No active disease. Electronically Signed   By: Alcide Clever M.D.   On: 03/31/2023 43:29   47 year old female patient with history of acute on chronic hemolytic anemia admitted to hospital for symptomatic anemia.  # Acute on chronic hemolytic anemia-hemoglobin on admission [3/5]-6.8.  Status post PRBC transfusion and also status post IV Solu-Medrol.  # Abdominal discomfort nausea-likely secondary to acute gastritis from steroids.   # Recommendations/plan:  # Proceed with IVIG infusion 400 mg/kg daily for 5 days; and also started Solu-Medrol 125 mg twice daily IV.  Will increase mycophenolate to thousand twice daily.  # Will monitor hemolysis labs closely-and taper above treatment based upon labs.  # Recommend Protonix twice a day.  # Recommend DVT prophylaxis 40 mg SQ daily.  Thank you Dr. Lucianne Muss for allowing me to participate in the care of your pleasant patient. Please do not hesitate to contact me with questions or concerns in the interim. Discussed with Dr.Kumar.  Above plan of care was discussed with patient/family in detail.  My contact information was given to the patient/family.    Addendum: Patient had an anaphylactic reaction grade 4-soon after patient was infusion of Solu-Medrol 250mg .  CODE BLUE called.  However status post EpiPen-with prompt resolution of symptoms.   As per Pharmacy- "After reviewing her previous methylprednisolone exposure there were 2 previous exposures in Oct 2024. The only difference in the dose yesterday is, as we discussed, yesterday's dose was in the vial w/ sterile water and the dose today was in  50mL of normal saline. Other drugs/items given very close in time to the allergic reaction include Fioricet (no previous documented exposure & patient unsure if she's had before) and chlorhexidine pads (also no previous documented exposure".  For now added Solu-Medrol to the allergy list.   Will HOLD off further Solu-Medrol-and also will hold off IVIG given the recent significant reaction.   I met with the patient updated her of the above concerns.  Will monitor closely.  Discussed with Dr. Lucianne Muss.   Earna Coder, MD  04/02/2023 2:48 PM

## 2023-04-02 NOTE — Progress Notes (Signed)
   04/02/23 1230  Spiritual Encounters  Type of Visit Initial  Care provided to: Pt and family (Daughter and Grandmother at bedside)  Conversation partners present during encounter Physician;Nurse  Referral source Code page  Reason for visit Code  OnCall Visit Yes  Spiritual Framework  Presenting Themes Impactful experiences and emotions  Interventions  Spiritual Care Interventions Made Prayer;Established relationship of care and support;Compassionate presence;Normalization of emotions ((also for family in the room))  Intervention Outcomes  Outcomes Connection to spiritual care;Awareness around self/spiritual resourses;Awareness of support

## 2023-04-02 NOTE — Progress Notes (Signed)
 Patient  lying in the and this nurse at bedside, patient stated she not felling to well , hyperventilating , HR 120 ,  complain of respiratory distress rapid was called , staff at bedside, patient said she filled like she can t swallow, DR Lucianne Muss was paged patient transfer to ICU

## 2023-04-02 NOTE — Progress Notes (Signed)
 RN caring for pt requested Dr. Lucianne Muss be overhead paged for rapid response. Heard it overhead paged.  1204pm Dr. Lucianne Muss paged by alphanumeric pager for rapid response.

## 2023-04-02 NOTE — Progress Notes (Signed)
   04/02/23 1215  Spiritual Encounters  Type of Visit Follow up  Reason for visit Code  OnCall Visit Yes   Chaplain received phone call from Unit Director regarding a CODE BLUE as the page was coming to the phone.  Chaplain's colleague was present to support the family when Chaplain arrived.    Rev. Rana M. Benard Rink Chaplain Resident  [facility]

## 2023-04-02 NOTE — Progress Notes (Signed)
 Dr. Lucianne Muss called 2A, call transferred to RN

## 2023-04-02 NOTE — Progress Notes (Signed)
 Patient was complaining of epigastric pain 10/10, Notified NP Ouma. Received orders for morphine and a LR bolus  for low blood pressure.

## 2023-04-02 NOTE — Plan of Care (Signed)
  Problem: Education: Goal: Knowledge of General Education information will improve Description: Including pain rating scale, medication(s)/side effects and non-pharmacologic comfort measures Outcome: Progressing   Problem: Clinical Measurements: Goal: Ability to maintain clinical measurements within normal limits will improve Outcome: Progressing Goal: Diagnostic test results will improve Outcome: Progressing Goal: Respiratory complications will improve Outcome: Progressing Goal: Cardiovascular complication will be avoided Outcome: Progressing   Problem: Activity: Goal: Risk for activity intolerance will decrease Outcome: Not Progressing

## 2023-04-03 DIAGNOSIS — D649 Anemia, unspecified: Secondary | ICD-10-CM | POA: Diagnosis not present

## 2023-04-03 LAB — CBC WITH DIFFERENTIAL/PLATELET
Abs Immature Granulocytes: 0.05 10*3/uL (ref 0.00–0.07)
Basophils Absolute: 0 10*3/uL (ref 0.0–0.1)
Basophils Relative: 0 %
Eosinophils Absolute: 0 10*3/uL (ref 0.0–0.5)
Eosinophils Relative: 0 %
HCT: 22.8 % — ABNORMAL LOW (ref 36.0–46.0)
Hemoglobin: 8.2 g/dL — ABNORMAL LOW (ref 12.0–15.0)
Immature Granulocytes: 1 %
Lymphocytes Relative: 17 %
Lymphs Abs: 0.8 10*3/uL (ref 0.7–4.0)
MCH: 36.4 pg — ABNORMAL HIGH (ref 26.0–34.0)
MCHC: 36 g/dL (ref 30.0–36.0)
MCV: 101.3 fL — ABNORMAL HIGH (ref 80.0–100.0)
Monocytes Absolute: 0.1 10*3/uL (ref 0.1–1.0)
Monocytes Relative: 2 %
Neutro Abs: 3.8 10*3/uL (ref 1.7–7.7)
Neutrophils Relative %: 80 %
Platelets: 114 10*3/uL — ABNORMAL LOW (ref 150–400)
RBC: 2.25 MIL/uL — ABNORMAL LOW (ref 3.87–5.11)
Smear Review: NORMAL
WBC: 4.8 10*3/uL (ref 4.0–10.5)
nRBC: 0.8 % — ABNORMAL HIGH (ref 0.0–0.2)

## 2023-04-03 LAB — HEPATIC FUNCTION PANEL
ALT: 24 U/L (ref 0–44)
AST: 42 U/L — ABNORMAL HIGH (ref 15–41)
Albumin: 3.9 g/dL (ref 3.5–5.0)
Alkaline Phosphatase: 46 U/L (ref 38–126)
Bilirubin, Direct: 0.3 mg/dL — ABNORMAL HIGH (ref 0.0–0.2)
Indirect Bilirubin: 1.6 mg/dL — ABNORMAL HIGH (ref 0.3–0.9)
Total Bilirubin: 1.9 mg/dL — ABNORMAL HIGH (ref 0.0–1.2)
Total Protein: 6.6 g/dL (ref 6.5–8.1)

## 2023-04-03 LAB — BASIC METABOLIC PANEL
Anion gap: 8 (ref 5–15)
BUN: 11 mg/dL (ref 6–20)
CO2: 24 mmol/L (ref 22–32)
Calcium: 9.1 mg/dL (ref 8.9–10.3)
Chloride: 105 mmol/L (ref 98–111)
Creatinine, Ser: 0.59 mg/dL (ref 0.44–1.00)
GFR, Estimated: >60 mL/min (ref >60)
Glucose, Bld: 140 mg/dL — ABNORMAL HIGH (ref 70–99)
Potassium: 4.5 mmol/L (ref 3.5–5.1)
Sodium: 137 mmol/L (ref 135–145)

## 2023-04-03 LAB — LACTATE DEHYDROGENASE: LDH: 2338 U/L — ABNORMAL HIGH (ref 98–192)

## 2023-04-03 LAB — MAGNESIUM: Magnesium: 2.4 mg/dL (ref 1.7–2.4)

## 2023-04-03 LAB — GLUCOSE, CAPILLARY
Glucose-Capillary: 90 mg/dL (ref 70–99)
Glucose-Capillary: 100 mg/dL — ABNORMAL HIGH (ref 70–99)
Glucose-Capillary: 194 mg/dL — ABNORMAL HIGH (ref 70–99)
Glucose-Capillary: 135 mg/dL — ABNORMAL HIGH (ref 70–99)

## 2023-04-03 LAB — HEMOGLOBIN AND HEMATOCRIT, BLOOD
HCT: 23.8 % — ABNORMAL LOW (ref 36.0–46.0)
Hemoglobin: 8.5 g/dL — ABNORMAL LOW (ref 12.0–15.0)

## 2023-04-03 LAB — PHOSPHORUS: Phosphorus: 3.6 mg/dL (ref 2.5–4.6)

## 2023-04-03 MED ORDER — ADULT MULTIVITAMIN W/MINERALS CH
1.0000 | ORAL_TABLET | Freq: Every day | ORAL | Status: DC
  Administered 2023-04-04 – 2023-04-09 (×6): 1 via ORAL
  Filled 2023-04-03 (×6): qty 1

## 2023-04-03 MED ORDER — PREDNISONE 50 MG PO TABS
100.0000 mg | ORAL_TABLET | Freq: Every day | ORAL | Status: DC
  Administered 2023-04-03 – 2023-04-07 (×5): 100 mg via ORAL
  Filled 2023-04-03 (×5): qty 2

## 2023-04-03 MED ORDER — ENSURE ENLIVE PO LIQD
237.0000 mL | Freq: Three times a day (TID) | ORAL | Status: DC
  Administered 2023-04-03 – 2023-04-04 (×2): 237 mL via ORAL

## 2023-04-03 NOTE — TOC Progression Note (Signed)
 Transition of Care [facility]) - Progression Note    Patient Details  Name: Danielle [name] MRN: [id number] Date of Birth: [birth year]  [facility] CM/SW Contact  Garret Reddish, RN Phone Number: 04/03/2023, 11:24 AM  Clinical Narrative:    Chart reviewed.  Noted that patient was admitted for Symptomatic anemia.  Medical Oncology following.    Patient had code blue yesterday and admitted to ICU for further monitoring.  Possible Anaphylactic Reaction-sever to IV Solumedrol.   No current TOC needs at this time.    TOC will continue to follow.          Expected Discharge Plan and Services                                               Social Determinants of Health (SDOH) Interventions SDOH Screenings   Food Insecurity: No Food Insecurity (04/01/2023)  Housing: Low Risk  (04/01/2023)  Transportation Needs: No Transportation Needs (04/01/2023)  Utilities: Not At Risk (04/01/2023)  Social Connections: Unknown (04/01/2023)  Tobacco Use: Medium Risk (04/01/2023)    Readmission Risk Interventions     No data to display

## 2023-04-03 NOTE — Progress Notes (Signed)
 Initial Nutrition Assessment  DOCUMENTATION CODES:   Obesity unspecified  INTERVENTION:   Ensure Enlive po TID, each supplement provides 350 kcal and 20 grams of protein.  MVI po daily   Pt at high refeed risk; recommend monitor potassium, magnesium and phosphorus labs daily until stable  Daily weights   NUTRITION DIAGNOSIS:   Inadequate oral intake related to acute illness as evidenced by per patient/family report.  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  Met with pt in room today. Pt reports poor appetite and oral intake for several days pta r/t nausea and vomiting. Pt reports that she feels hungry sometimes, but then when she eats, food will get stuck in her mid chest area and then she will regurgitate it back up. Pt reports chest pain that she relates to reflux. Pt reports that she has been able to keep some liquids down. Pt reports eating a pancake for breakfast this morning. RD discussed with pt the importance of adequate nutrition needed to preserve lean muscle. Pt is agreeable to drinking chocolate Ensure. RD will add supplements and MVI. Pt is at high refeed risk.    Per chart, pt appears to be down 13lbs(6%) over the past month; this is significant weight loss.   Medications reviewed and include: lovenox, folic acid, insulin, cellcept, protonix, prednisone  Labs reviewed: K 4.5 wnl, P 3.6 wnl, Mg 2.4 wnl Iron 212(H), TIBC 281, ferritin 293- 3/6 Hgb 8.5(L), Hct 23.8(L) Cbgs- 100, 90 x 24 hrs   NUTRITION - FOCUSED PHYSICAL EXAM:  Flowsheet Row Most Recent Value  Orbital  Region No depletion  Upper Arm Region No depletion  Thoracic and Lumbar Region No depletion  Buccal Region No depletion  Temple Region No depletion  Clavicle Bone Region No depletion  Clavicle and Acromion Bone Region No depletion  Scapular Bone Region No depletion  Dorsal Hand No depletion  Patellar Region No depletion  Anterior Thigh Region No depletion  Posterior Calf Region No depletion  Edema (RD Assessment) None  Hair Reviewed  Eyes Reviewed  Mouth Reviewed  Skin Reviewed  Nails Reviewed   Diet Order:   Diet Order             Diet regular Room service appropriate? Yes; Fluid consistency: Thin  Diet effective now                  EDUCATION NEEDS:   Education needs have been addressed  Skin:  Skin Assessment: Reviewed RN Assessment  Last BM:  3/5  Height:   Ht Readings from Last 1 Encounters:  04/02/23 5\' 3"  (1.6 m)    Weight:   Wt Readings from Last 1 Encounters:  04/02/23 97.6 kg    Ideal Body Weight:  52.2 kg  BMI:  Body mass index is 38.12 kg/m.  Estimated Nutritional Needs:   Kcal:  1800-2100kcal/day  Protein:  90-105g/day  Fluid:  1.7-1.9L/day  Betsey Holiday MS, RD, LDN If unable to be reached, please send secure chat to "RD inpatient" available from 8:00a-4:00p daily

## 2023-04-03 NOTE — Progress Notes (Signed)
 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     Patient was sent from outpatient hematology/oncology clinic.   Vitals in the ED showed Tmax of 100, respiration rate of 18, heart rate 127 and on repeat was 103, blood pressure 101/55, SpO2 100% on room air.   Serum sodium is 136, potassium 3.5, chloride 106, bicarb 21, BUN 11, serum creatinine 0.66, EGFR greater than 60, nonfasting blood glucose 153.   ED treatment: Solu-Medrol 100 mg IV one-time dose, sodium chloride 500 mL liter bolus.  Patient has been typed and crossed.  2 units PRBC has been ordered for transfusion.   Assessment and Plan:  Severe autoimmune hemolytic anemia Presented with symptomatic anemia, Hb 6.8 Elevated LDH level and indirect bilirubin due to hemolysis DAT negative, Steroid responsive S/p 2 unit of PRBC transfusion given on 3/5 S/p Solu-Medrol IV 100 mg x 1 dose given on 3/5, tolerated well with no reaction S/p IV Solu-Medrol 250 mg time 1 dose given and patient developed anaphylactic reaction, choking sensation, difficulty breathing which resolved after EpiPen.  Patient also did Benadryl 50 mg IV Patient was seen by heme-onc, recommended Solu-Medrol to 40 mg IV daily and IV IgG but patient developed anaphylactic reaction so discontinued IV Solu-Medrol and postponed IV IgG for now. Patient was transferred to stepdown unit for close monitoring. We will follow heme-onc for further recommendation tomorrow  a.m. 3/6 started mycophenolate 1000 mg p.o. twice daily 3/7 started prednisone 100 mg p.o. daily, hematologist thinking to start IV IgG slow titration, he discussed with pharmacy.  Patient may need splenectomy down the road if does not respond to steroids. LDH 2338 elevated Hb 8.2 in am    Anaphylactic reaction developed soon after receiving Solu-Medrol IV  250 mg on 3/6 Patient received Benadryl 50 mg, anaphylactic reaction resolved after receiving EpiPen IM injection Patient also received Fioricet one dose and chlorhexidine pads at the same time as per pharmacy Continue to monitor vital signs closely  Hypokalemia, resolved Monitor electrolytes   Hyperglycemia most likely due to steroids Continue NovoLog sliding scale Monitor CBG Patient does not want to be on diabetic diet.  GERD possible due to steroids, continue PPI twice daily  Obesity, class II Body mass index is 38.12 kg/m.  Interventions:  Diet: Carb modified diet DVT Prophylaxis: Subcutaneous Lovenox   Advance goals of care discussion: Full code  Family Communication: family was present at bedside, at the time of interview.  The pt provided permission to discuss medical plan with the family. Opportunity was given to ask question and all questions were answered satisfactorily.   Disposition:  Pt is from Home, admitted with autoimmune hemolytic anemia, still has risk of low hemoglobin, which precludes a safe discharge. Discharge to ome, when cleared by hematologist.  Subjective: No significant events overnight, patient denied any delayed allergic reaction, no shortness of breath, no chest pain or palpitation.  Patient was resting comfortably, denied any complaints.   Physical Exam: General: NAD, lying comfortably Appear in no distress, affect appropriate Eyes: PERRLA ENT:  Oral Mucosa Clear, moist  Neck: no JVD,  Cardiovascular: S1 and S2 Present, no Murmur,  Respiratory: good respiratory effort, Bilateral Air  entry equal and Decreased, no Crackles, no wheezes Abdomen: Bowel Sound present, Soft and no tenderness,  Skin: no rashes Extremities: no Pedal edema, no calf tenderness Neurologic: without any new focal findings Gait not checked due to patient safety concerns  Vitals:   04/03/23 1100 04/03/23 1104 04/03/23 1200 04/03/23 1234  BP:  98/69 (!) 71/47 105/62  Pulse:  80 78 81  Resp:  (!) 23 20 (!) 24  Temp: 98.3 F (36.8 C)     TempSrc: Oral     SpO2:  100% 100% 96%  Weight:      Height:        Intake/Output Summary (Last 24 hours) at 04/03/2023 1412 Last data filed at 04/03/2023 1100 Gross per 24 hour  Intake 1087.37 ml  Output --  Net 1087.37 ml   Filed Weights   04/01/23 1300 04/02/23 1245  Weight: 97.1 kg 97.6 kg    Data Reviewed: I have personally reviewed and interpreted daily labs, tele strips, imagings as discussed above. I reviewed all nursing notes, pharmacy notes, vitals, pertinent old records I have discussed plan of care as described above with RN and patient/family.  CBC: Recent Labs  Lab 03/31/23 0111 04/01/23 1006 04/01/23 1157 04/02/23 0011 04/02/23 0357 04/02/23 1432 04/03/23 0254  WBC 4.7 3.2* 3.2*  --  3.3*  --  4.8  NEUTROABS  --  2.1  --   --  2.4  --  3.8  HGB 7.4* 6.8* 6.6* 8.2* 8.1* 8.3* 8.2*  HCT 20.4* 18.9* 17.7* 22.7* 22.4* 22.7* 22.8*  MCV 114.0* 113.9* 112.7*  --  100.4*  --  101.3*  PLT 117* 133* 95*  --  86*  --  114*   Basic Metabolic Panel: Recent Labs  Lab 03/31/23 0111 04/01/23 1006 04/01/23 1157 04/02/23 0357 04/03/23 0254  NA 136 133* 136 135 137  K 3.4* 3.1* 3.5 3.8 4.5  CL 102 100 106 108 105  CO2 18* 22 21* 23 24  GLUCOSE 145* 138* 153* 136* 140*  BUN 13 10 11 10 11   CREATININE 0.69 0.67 0.66 0.60 0.59  CALCIUM 9.6 8.8* 9.3 9.0 9.1  MG  --   --  1.9 2.2 2.4  PHOS  --   --   --  2.7 3.6    Studies: No results found.  Scheduled Meds:  Chlorhexidine Gluconate Cloth  6 each Topical Daily   enoxaparin (LOVENOX)  injection  40 mg Subcutaneous Q24H   folic acid  1 mg Oral Daily   insulin aspart  0-5 Units Subcutaneous QHS   insulin aspart  0-9 Units Subcutaneous TID WC   mycophenolate  1,000 mg Oral BID   pantoprazole  40 mg Oral BID AC   predniSONE  100 mg Oral Q breakfast   Continuous Infusions:   PRN Meds: acetaminophen **OR** acetaminophen, alum & mag hydroxide-simeth, morphine injection, ondansetron **OR** ondansetron (ZOFRAN) IV, senna-docusate  Time spent: 40 minutes  Author: Gillis Santa. MD [facility] 04/03/2023 2:12 PM  To reach On-call, see care teams to locate the attending and reach out to them via [email or web address]. If 7PM-7AM, please contact night-coverage If you still have difficulty reaching the attending provider, please page the [facility] (Director on Call) for [facility] on amion for assistance.

## 2023-04-03 NOTE — Plan of Care (Signed)

## 2023-04-03 NOTE — Progress Notes (Signed)
 Danielle [name]   DOB:[birth year]   WU#:[id number]    Subjective: Overnight no acute events.  Patient complains of back pain overnight.  Received morphine without any reactions.  Resting in the bed talking to the daughter.  Objective:  Vitals:   04/03/23 0700 04/03/23 0737  BP: 111/77   Pulse: 80 75  Resp: 18 17  Temp:  98.8 F (37.1 C)  SpO2: 99% 100%     Intake/Output Summary (Last 24 hours) at 04/03/2023 0902 Last data filed at 04/02/2023 1700 Gross per 24 hour  Intake 667.72 ml  Output --  Net 667.72 ml    Physical Exam Vitals and nursing note reviewed.  HENT:     Head: Normocephalic and atraumatic.     Mouth/Throat:     Pharynx: Oropharynx is clear.  Eyes:     Extraocular Movements: Extraocular movements intact.     Pupils: Pupils are equal, round, and reactive to light.  Cardiovascular:     Rate and Rhythm: Normal rate and regular rhythm.  Pulmonary:     Comments: Decreased breath sounds bilaterally.  Abdominal:     Palpations: Abdomen is soft.  Musculoskeletal:        General: Normal range of motion.     Cervical back: Normal range of motion.  Skin:    General: Skin is warm.  Neurological:     General: No focal deficit present.     Mental Status: She is alert and oriented to person, place, and time.  Psychiatric:        Behavior: Behavior normal.        Judgment: Judgment normal.      Labs:  Lab Results  Component Value Date   WBC 4.8 04/03/2023   HGB 8.2 (L) 04/03/2023   HCT 22.8 (L) 04/03/2023   MCV 101.3 (H) 04/03/2023   PLT 114 (L) 04/03/2023   NEUTROABS 3.8 04/03/2023    Lab Results  Component Value Date   NA 137 04/03/2023   K 4.5 04/03/2023   CL 105 04/03/2023   CO2 24 04/03/2023    Studies:  No results found.  47 year old female patient with history of acute on chronic hemolytic anemia admitted to hospital for symptomatic anemia.   # Acute on chronic hemolytic anemia-hemoglobin on admission [3/5]-6.8.  Status post PRBC transfusion  and also status post IV Solu-Medrol- [see below]-hemoglobin today is 8.2/however LDH is rising-overall stable.  Will plan to start prednisone today 100mg /day.  Discussed with the patient that her options including but not limited to IVIG [slow titration; discussed with pharmacy]; and also the possibility of splenectomy down the line if patient does not respond to steroids.  # Anaphylactic reaction-severe- ? IV solumderol-discussed with pharmacy.  Hold off any Solu-Medrol.   # Abdominal discomfort nausea-likely secondary to acute gastritis from steroids-on PPI.  # Back pain needing morphine-suspect musculoskeletal -monitor closely.   # Continue DVT prophylaxis 40 mg SQ daily.    Earna Coder, MD 04/03/2023  9:02 AM

## 2023-04-03 NOTE — Progress Notes (Signed)
   04/03/23 0930  Spiritual Encounters  Type of Visit Follow up  Care provided to: Pt and family (Daughter at bedside)  Referral source Chaplain assessment  Reason for visit Routine spiritual support  OnCall Visit No  Spiritual Framework  Presenting Themes Coping tools;Other (comment) (Self-care)  Interventions  Spiritual Care Interventions Made Established relationship of care and support;Compassionate presence;Reflective listening;Normalization of emotions;Self-care teaching  Intervention Outcomes  Outcomes Awareness of support;Connection to spiritual care;Awareness around self/spiritual resourses;Connection to values and goals of care

## 2023-04-04 DIAGNOSIS — D591 Autoimmune hemolytic anemia, unspecified: Secondary | ICD-10-CM

## 2023-04-04 DIAGNOSIS — E611 Iron deficiency: Secondary | ICD-10-CM

## 2023-04-04 DIAGNOSIS — K2900 Acute gastritis without bleeding: Secondary | ICD-10-CM

## 2023-04-04 DIAGNOSIS — D649 Anemia, unspecified: Secondary | ICD-10-CM | POA: Diagnosis not present

## 2023-04-04 LAB — LACTATE DEHYDROGENASE: LDH: 1767 U/L — ABNORMAL HIGH (ref 98–192)

## 2023-04-04 LAB — CBC WITH DIFFERENTIAL/PLATELET
Abs Immature Granulocytes: 0.02 10*3/uL (ref 0.00–0.07)
Basophils Absolute: 0 10*3/uL (ref 0.0–0.1)
Basophils Relative: 0 %
Eosinophils Absolute: 0 10*3/uL (ref 0.0–0.5)
Eosinophils Relative: 0 %
HCT: 22.2 % — ABNORMAL LOW (ref 36.0–46.0)
Hemoglobin: 8 g/dL — ABNORMAL LOW (ref 12.0–15.0)
Immature Granulocytes: 1 %
Lymphocytes Relative: 37 %
Lymphs Abs: 1.5 10*3/uL (ref 0.7–4.0)
MCH: 35.4 pg — ABNORMAL HIGH (ref 26.0–34.0)
MCHC: 36 g/dL (ref 30.0–36.0)
MCV: 98.2 fL (ref 80.0–100.0)
Monocytes Absolute: 0.1 10*3/uL (ref 0.1–1.0)
Monocytes Relative: 3 %
Neutro Abs: 2.4 10*3/uL (ref 1.7–7.7)
Neutrophils Relative %: 59 %
Platelets: 74 10*3/uL — ABNORMAL LOW (ref 150–400)
RBC: 2.26 MIL/uL — ABNORMAL LOW (ref 3.87–5.11)
RDW: 24.8 % — ABNORMAL HIGH (ref 11.5–15.5)
Smear Review: NORMAL
WBC: 4 10*3/uL (ref 4.0–10.5)
nRBC: 1.5 % — ABNORMAL HIGH (ref 0.0–0.2)

## 2023-04-04 LAB — PHOSPHORUS: Phosphorus: 4.8 mg/dL — ABNORMAL HIGH (ref 2.5–4.6)

## 2023-04-04 LAB — HEPATIC FUNCTION PANEL
ALT: 24 U/L (ref 0–44)
AST: 24 U/L (ref 15–41)
Albumin: 3.5 g/dL (ref 3.5–5.0)
Alkaline Phosphatase: 39 U/L (ref 38–126)
Bilirubin, Direct: 0.4 mg/dL — ABNORMAL HIGH (ref 0.0–0.2)
Indirect Bilirubin: 2.3 mg/dL — ABNORMAL HIGH (ref 0.3–0.9)
Total Bilirubin: 2.7 mg/dL — ABNORMAL HIGH (ref 0.0–1.2)
Total Protein: 6.1 g/dL — ABNORMAL LOW (ref 6.5–8.1)

## 2023-04-04 LAB — BASIC METABOLIC PANEL
Anion gap: 5 (ref 5–15)
BUN: 14 mg/dL (ref 6–20)
CO2: 28 mmol/L (ref 22–32)
Calcium: 8.9 mg/dL (ref 8.9–10.3)
Chloride: 102 mmol/L (ref 98–111)
Creatinine, Ser: 0.66 mg/dL (ref 0.44–1.00)
GFR, Estimated: >60 mL/min (ref >60)
Glucose, Bld: 99 mg/dL (ref 70–99)
Potassium: 4.2 mmol/L (ref 3.5–5.1)
Sodium: 135 mmol/L (ref 135–145)

## 2023-04-04 LAB — MAGNESIUM: Magnesium: 2.3 mg/dL (ref 1.7–2.4)

## 2023-04-04 LAB — HAPTOGLOBIN: Haptoglobin: <10 mg/dL — ABNORMAL LOW (ref 42–296)

## 2023-04-04 LAB — GLUCOSE, CAPILLARY
Glucose-Capillary: 88 mg/dL (ref 70–99)
Glucose-Capillary: 108 mg/dL — ABNORMAL HIGH (ref 70–99)
Glucose-Capillary: 131 mg/dL — ABNORMAL HIGH (ref 70–99)
Glucose-Capillary: 237 mg/dL — ABNORMAL HIGH (ref 70–99)
Glucose-Capillary: 139 mg/dL — ABNORMAL HIGH (ref 70–99)

## 2023-04-04 MED ORDER — POLYETHYLENE GLYCOL 3350 17 G PO PACK
17.0000 g | PACK | Freq: Two times a day (BID) | ORAL | Status: DC
  Administered 2023-04-04 – 2023-04-07 (×4): 17 g via ORAL
  Filled 2023-04-04 (×5): qty 1

## 2023-04-04 MED ORDER — BISACODYL 10 MG RE SUPP
10.0000 mg | Freq: Every day | RECTAL | Status: DC
  Administered 2023-04-05: 10 mg via RECTAL
  Filled 2023-04-04 (×2): qty 1

## 2023-04-04 NOTE — Plan of Care (Signed)
  Problem: Education: Goal: Knowledge of General Education information will improve Description: Including pain rating scale, medication(s)/side effects and non-pharmacologic comfort measures Outcome: Progressing   Problem: Health Behavior/Discharge Planning: Goal: Ability to manage health-related needs will improve Outcome: Progressing   Problem: Clinical Measurements: Goal: Ability to maintain clinical measurements within normal limits will improve Outcome: Progressing Goal: Will remain free from infection Outcome: Progressing Goal: Diagnostic test results will improve Outcome: Progressing Goal: Respiratory complications will improve Outcome: Progressing Goal: Cardiovascular complication will be avoided Outcome: Progressing   Problem: Activity: Goal: Risk for activity intolerance will decrease Outcome: Not Progressing   Problem: Nutrition: Goal: Adequate nutrition will be maintained Outcome: Not Progressing   Problem: Coping: Goal: Level of anxiety will decrease Outcome: Not Progressing   Problem: Elimination: Goal: Will not experience complications related to bowel motility Outcome: Not Progressing Goal: Will not experience complications related to urinary retention Outcome: Progressing   Problem: Pain Managment: Goal: General experience of comfort will improve and/or be controlled Outcome: Not Progressing   Problem: Safety: Goal: Ability to remain free from injury will improve Outcome: Progressing   Problem: Skin Integrity: Goal: Risk for impaired skin integrity will decrease Outcome: Progressing   Problem: Education: Goal: Ability to describe self-care measures that may prevent or decrease complications (Diabetes Survival Skills Education) will improve Outcome: Progressing Goal: Individualized Educational Video(s) Outcome: Progressing   Problem: Coping: Goal: Ability to adjust to condition or change in health will improve Outcome: Progressing    Problem: Fluid Volume: Goal: Ability to maintain a balanced intake and output will improve Outcome: Progressing   Problem: Health Behavior/Discharge Planning: Goal: Ability to identify and utilize available resources and services will improve Outcome: Progressing Goal: Ability to manage health-related needs will improve Outcome: Progressing   Problem: Metabolic: Goal: Ability to maintain appropriate glucose levels will improve Outcome: Progressing   Problem: Nutritional: Goal: Maintenance of adequate nutrition will improve Outcome: Not Progressing Goal: Progress toward achieving an optimal weight will improve Outcome: Progressing   Problem: Skin Integrity: Goal: Risk for impaired skin integrity will decrease Outcome: Progressing   Problem: Tissue Perfusion: Goal: Adequacy of tissue perfusion will improve Outcome: Progressing  Pt A&O and on RA. Able to stand at bedside to [facility] or ambulate to toilet. Pt reports that abdominal pain is aggravated when OOB. PRN Morphine and Zofran administered for pain and nausea, respectively. Pt's family remains at bedside.

## 2023-04-04 NOTE — Plan of Care (Signed)
 Treated for headache today with tylenol with partial relief. Pt also complains of abdominal pain. PT reports last BM as 3/4, prior to admission to hospital; states does not have frequent BM's due to hemorrhoids. Has not been eating today. Pt was given senekot-s last night; given miralax this afternoon; will give dulcolax if the above are not effective. Pt also given morphine for abdominal pain as she reports severity as 9/10. Pt is able to ambulate to bathroom to void but requires assistance and walker. Experiences SOB and weakness as well as tachycardia. BP's run on "soft" side. Pt advised not to get OOB without assistance, should call for help.   Problem: Education: Goal: Knowledge of General Education information will improve Description: Including pain rating scale, medication(s)/side effects and non-pharmacologic comfort measures Outcome: Progressing   Problem: Health Behavior/Discharge Planning: Goal: Ability to manage health-related needs will improve Outcome: Progressing   Problem: Clinical Measurements: Goal: Ability to maintain clinical measurements within normal limits will improve Outcome: Progressing Goal: Will remain free from infection Outcome: Progressing Goal: Diagnostic test results will improve Outcome: Progressing Goal: Respiratory complications will improve Outcome: Progressing Goal: Cardiovascular complication will be avoided Outcome: Progressing   Problem: Activity: Goal: Risk for activity intolerance will decrease Outcome: Progressing   Problem: Nutrition: Goal: Adequate nutrition will be maintained Outcome: Not Progressing   Problem: Coping: Goal: Level of anxiety will decrease Outcome: Not Progressing   Problem: Elimination: Goal: Will not experience complications related to bowel motility Outcome: Not Progressing Goal: Will not experience complications related to urinary retention Outcome: Progressing   Problem: Pain Managment: Goal: General  experience of comfort will improve and/or be controlled Outcome: Not Progressing   Problem: Safety: Goal: Ability to remain free from injury will improve Outcome: Progressing   Problem: Skin Integrity: Goal: Risk for impaired skin integrity will decrease Outcome: Progressing   Problem: Coping: Goal: Ability to adjust to condition or change in health will improve Outcome: Progressing   Problem: Fluid Volume: Goal: Ability to maintain a balanced intake and output will improve Outcome: Progressing   Problem: Metabolic: Goal: Ability to maintain appropriate glucose levels will improve Outcome: Progressing   Problem: Nutritional: Goal: Maintenance of adequate nutrition will improve Outcome: Not Progressing   Problem: Skin Integrity: Goal: Risk for impaired skin integrity will decrease Outcome: Progressing   Problem: Tissue Perfusion: Goal: Adequacy of tissue perfusion will improve Outcome: Progressing

## 2023-04-04 NOTE — Progress Notes (Signed)
 [facility]  - [place] at [facility]   PATIENT NAME: Danielle [name]    MR#:  [id number]  DATE OF BIRTH:  09-03-1975  SUBJECTIVE:      VITALS:  Blood pressure (!) 87/51, pulse 86, temperature 98 F (36.7 C), temperature source Oral, resp. rate 17, height 5\' 3"  (1.6 m), weight 97.6 kg, last menstrual period 11/21/2020, SpO2 100%.  PHYSICAL EXAMINATION:   GENERAL:  48 y.o.-year-old patient with no acute distress.  LUNGS: Normal breath sounds bilaterally, no wheezing CARDIOVASCULAR: S1, S2 normal. No murmur   ABDOMEN: Soft, nontender, nondistended. Bowel sounds present.  EXTREMITIES: No  edema b/l.    NEUROLOGIC: nonfocal  patient is alert and awake SKIN: No obvious rash, lesion, or ulcer.   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   Patient was sent from outpatient hematology/oncology clinic.   Severe autoimmune hemolytic anemia thrombocytopenia Presented with symptomatic anemia, Hb 6.8 --Elevated LDH level and indirect bilirubin due to hemolysis --DAT negative, Steroid responsive --S/p 2 unit of PRBC transfusion given on 3/5 --S/p Solu-Medrol IV 100 mg x 1 dose given on 3/5, tolerated well with no reaction --S/p IV Solu-Medrol 250 mg time 1 dose given and patient developed anaphylactic reaction, choking sensation, difficulty breathing which resolved after EpiPen.  Patient  also did Benadryl 50 mg IV --Patient was seen by heme-onc Dr. Donneta Romberg recommends oral prednisone and consider IVIG /future splenectomy depending on labs  --LDH 2338 elevated --Hb 8.0   Anaphylactic reaction developed soon after receiving Solu-Medrol IV  250 mg on 3/6 Patient received Benadryl 50 mg, anaphylactic reaction resolved after receiving EpiPen IM injection -- now stable. Tolerating PO prednisone  Hypokalemia -- replete electrolytes  Mild hyperglycemia suspected due to steroids -- SSI  Gerd -- continue PPI  Obesity class II   Procedures: Family communication : daughter Consults : oncology hematology CODE STATUS: full DVT Prophylaxis : enoxaparin Level of care: Stepdown Status is: Inpatient Remains inpatient appropriate because: severe hemolytic anemia    TOTAL TIME TAKING CARE OF THIS PATIENT: 50 minutes.  >50% time spent on counselling and coordination of care  Note: This dictation was prepared with Dragon dictation along with smaller phrase technology. Any transcriptional errors that result from this process are unintentional.  [name] M.D    [facility]   CC: Primary care physician; Armando Gang, FNP

## 2023-04-05 ENCOUNTER — Inpatient Hospital Stay

## 2023-04-05 DIAGNOSIS — D649 Anemia, unspecified: Secondary | ICD-10-CM | POA: Diagnosis not present

## 2023-04-05 DIAGNOSIS — D591 Autoimmune hemolytic anemia, unspecified: Secondary | ICD-10-CM | POA: Diagnosis not present

## 2023-04-05 DIAGNOSIS — E611 Iron deficiency: Secondary | ICD-10-CM | POA: Diagnosis not present

## 2023-04-05 LAB — BPAM RBC
Blood Product Expiration Date: 202503232359
Blood Product Expiration Date: 202503232359
Blood Product Expiration Date: 202504062359
Blood Product Expiration Date: 202504082359
ISSUE DATE / TIME: 202503051511
ISSUE DATE / TIME: 202503052013
Unit Type and Rh: 7300
Unit Type and Rh: 7300
Unit Type and Rh: 7300
Unit Type and Rh: 7300

## 2023-04-05 LAB — CBC WITH DIFFERENTIAL/PLATELET
Abs Immature Granulocytes: 0.03 10*3/uL (ref 0.00–0.07)
Basophils Absolute: 0 10*3/uL (ref 0.0–0.1)
Basophils Relative: 0 %
Eosinophils Absolute: 0 10*3/uL (ref 0.0–0.5)
Eosinophils Relative: 0 %
HCT: 21 % — ABNORMAL LOW (ref 36.0–46.0)
Hemoglobin: 7.7 g/dL — ABNORMAL LOW (ref 12.0–15.0)
Immature Granulocytes: 1 %
Lymphocytes Relative: 30 %
Lymphs Abs: 0.9 10*3/uL (ref 0.7–4.0)
MCH: 35.5 pg — ABNORMAL HIGH (ref 26.0–34.0)
MCHC: 36.7 g/dL — ABNORMAL HIGH (ref 30.0–36.0)
MCV: 96.8 fL (ref 80.0–100.0)
Monocytes Absolute: 0.1 10*3/uL (ref 0.1–1.0)
Monocytes Relative: 4 %
Neutro Abs: 1.9 10*3/uL (ref 1.7–7.7)
Neutrophils Relative %: 65 %
Platelets: 56 10*3/uL — ABNORMAL LOW (ref 150–400)
RBC: 2.17 MIL/uL — ABNORMAL LOW (ref 3.87–5.11)
RDW: 24.3 % — ABNORMAL HIGH (ref 11.5–15.5)
WBC: 2.9 10*3/uL — ABNORMAL LOW (ref 4.0–10.5)
nRBC: 1.4 % — ABNORMAL HIGH (ref 0.0–0.2)

## 2023-04-05 LAB — LACTATE DEHYDROGENASE: LDH: 1679 U/L — ABNORMAL HIGH (ref 98–192)

## 2023-04-05 LAB — TYPE AND SCREEN
ABO/RH(D): B POS
Antibody Screen: POSITIVE
Donor AG Type: NEGATIVE
Donor AG Type: NEGATIVE
Unit division: 0
Unit division: 0
Unit division: 0
Unit division: 0

## 2023-04-05 LAB — PREPARE RBC (CROSSMATCH)

## 2023-04-05 LAB — GLUCOSE, CAPILLARY
Glucose-Capillary: 102 mg/dL — ABNORMAL HIGH (ref 70–99)
Glucose-Capillary: 130 mg/dL — ABNORMAL HIGH (ref 70–99)
Glucose-Capillary: 154 mg/dL — ABNORMAL HIGH (ref 70–99)
Glucose-Capillary: 195 mg/dL — ABNORMAL HIGH (ref 70–99)

## 2023-04-05 LAB — HEPATIC FUNCTION PANEL
ALT: 20 U/L (ref 0–44)
AST: 24 U/L (ref 15–41)
Albumin: 3.8 g/dL (ref 3.5–5.0)
Alkaline Phosphatase: 40 U/L (ref 38–126)
Bilirubin, Direct: 0.4 mg/dL — ABNORMAL HIGH (ref 0.0–0.2)
Indirect Bilirubin: 2.3 mg/dL — ABNORMAL HIGH (ref 0.3–0.9)
Total Bilirubin: 2.7 mg/dL — ABNORMAL HIGH (ref 0.0–1.2)
Total Protein: 6.1 g/dL — ABNORMAL LOW (ref 6.5–8.1)

## 2023-04-05 LAB — MAGNESIUM: Magnesium: 2.6 mg/dL — ABNORMAL HIGH (ref 1.7–2.4)

## 2023-04-05 LAB — PHOSPHORUS: Phosphorus: 4.5 mg/dL (ref 2.5–4.6)

## 2023-04-05 MED ORDER — TRAMADOL HCL 50 MG PO TABS
50.0000 mg | ORAL_TABLET | Freq: Four times a day (QID) | ORAL | Status: DC | PRN
  Administered 2023-04-05 – 2023-04-08 (×9): 50 mg via ORAL
  Filled 2023-04-05 (×8): qty 1

## 2023-04-05 MED ORDER — LACTULOSE 10 GM/15ML PO SOLN
30.0000 g | Freq: Two times a day (BID) | ORAL | Status: DC
  Administered 2023-04-05: 30 g via ORAL
  Filled 2023-04-05 (×3): qty 60

## 2023-04-05 MED ORDER — SODIUM CHLORIDE 0.9% IV SOLUTION: Freq: Once | INTRAVENOUS | Status: AC

## 2023-04-05 MED ORDER — TRAMADOL HCL 50 MG PO TABS
50.0000 mg | ORAL_TABLET | Freq: Three times a day (TID) | ORAL | Status: DC | PRN
  Administered 2023-04-05: 50 mg via ORAL
  Filled 2023-04-05: qty 1

## 2023-04-05 MED ORDER — TRAMADOL HCL 50 MG PO TABS
50.0000 mg | ORAL_TABLET | Freq: Once | ORAL | Status: AC
  Administered 2023-04-05: 50 mg via ORAL
  Filled 2023-04-05: qty 1

## 2023-04-05 NOTE — Progress Notes (Signed)
 Pt transported via bed to room 108; no telemetry accompanied by this RN and second Charity fundraiser. Final hand off given to [employer]. NT at bedside.

## 2023-04-05 NOTE — Progress Notes (Signed)
 Report called to [facility], for room 108. Pt will transfer without telemetry. Currently on [facility] for BM.

## 2023-04-05 NOTE — Progress Notes (Signed)
 [facility]  - [place] at [facility]   PATIENT NAME: Danielle [name]    MR#:  [id number]  DATE OF BIRTH:  17-Nov-1975  SUBJECTIVE:   Patient has been constipated for more than six days. Having abdominal discomfort. Encouraging her to have family bring food from home. Some nausea. No family at bedside in the morning when I saw patient   VITALS:  Blood pressure 118/68, pulse (!) 104, temperature 98.9 F (37.2 C), temperature source Oral, resp. rate (!) 22, height 5\' 3"  (1.6 m), weight 99.8 kg, last menstrual period 11/21/2020, SpO2 96%.  PHYSICAL EXAMINATION:   GENERAL:  48 y.o.-year-old patient with no acute distress. Morbid obesity LUNGS: Normal breath sounds bilaterally, no wheezing CARDIOVASCULAR: S1, S2 normal. No murmur   ABDOMEN: Soft, diffusely tender, nondistended.  EXTREMITIES: No  edema b/l.    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   Patient was sent from outpatient hematology/oncology clinic.   Severe autoimmune hemolytic anemia thrombocytopenia Presented with symptomatic anemia, Hb 6.8 --Elevated LDH level and indirect bilirubin due to hemolysis --DAT negative, Steroid responsive --S/p 2 unit of PRBC transfusion given on 3/5 --S/p  Solu-Medrol IV 100 mg x 1 dose given on 3/5, tolerated well with no reaction --S/p IV Solu-Medrol 250 mg time 1 dose given and patient developed anaphylactic reaction, choking sensation, difficulty breathing which resolved after EpiPen.  Patient also did Benadryl 50 mg IV --Patient was seen by heme-onc Dr. Donneta Romberg recommends oral prednisone and consider IVIG /future splenectomy depending on labs  --LDH 2338 --1679 --Hb 8.0--7.7 -- discussed with Dr. Donneta Romberg-- giving another unit of blood transfusion today. Will move out of ICU. Since LDH is improving no immediate need for IV IG  Anaphylactic reaction developed soon after receiving Solu-Medrol IV  250 mg on 3/6 Patient received Benadryl 50 mg, anaphylactic reaction resolved after receiving EpiPen IM injection -- now stable. Tolerating PO prednisone  Hypokalemia -- replete electrolytes  Mild hyperglycemia suspected due to steroids -- SSI  Gerd -- continue PPI  Obesity class II   Procedures: Family communication : none today Consults : oncology hematology CODE STATUS: full DVT Prophylaxis : enoxaparin Level of care: Med-Surg Status is: Inpatient Remains inpatient appropriate because: severe hemolytic anemia    TOTAL TIME TAKING CARE OF THIS PATIENT: 40 minutes.  >50% time spent on counselling and coordination of care  Note: This dictation was prepared with Dragon dictation along with smaller phrase technology. Any transcriptional errors that result from this process are unintentional.  [name] M.D    [facility]   CC: Primary  care physician; Armando Gang, FNP

## 2023-04-06 ENCOUNTER — Inpatient Hospital Stay

## 2023-04-06 DIAGNOSIS — E611 Iron deficiency: Secondary | ICD-10-CM | POA: Diagnosis not present

## 2023-04-06 DIAGNOSIS — D591 Autoimmune hemolytic anemia, unspecified: Secondary | ICD-10-CM | POA: Diagnosis not present

## 2023-04-06 DIAGNOSIS — D649 Anemia, unspecified: Secondary | ICD-10-CM | POA: Diagnosis not present

## 2023-04-06 LAB — CBC WITH DIFFERENTIAL/PLATELET
Abs Immature Granulocytes: 0.03 10*3/uL (ref 0.00–0.07)
Basophils Absolute: 0 10*3/uL (ref 0.0–0.1)
Basophils Relative: 0 %
Eosinophils Absolute: 0 10*3/uL (ref 0.0–0.5)
Eosinophils Relative: 0 %
HCT: 24.6 % — ABNORMAL LOW (ref 36.0–46.0)
Hemoglobin: 8.8 g/dL — ABNORMAL LOW (ref 12.0–15.0)
Immature Granulocytes: 1 %
Lymphocytes Relative: 41 %
Lymphs Abs: 1.6 10*3/uL (ref 0.7–4.0)
MCH: 34.2 pg — ABNORMAL HIGH (ref 26.0–34.0)
MCHC: 35.8 g/dL (ref 30.0–36.0)
MCV: 95.7 fL (ref 80.0–100.0)
Monocytes Absolute: 0.2 10*3/uL (ref 0.1–1.0)
Monocytes Relative: 5 %
Neutro Abs: 2 10*3/uL (ref 1.7–7.7)
Neutrophils Relative %: 53 %
Platelets: 53 10*3/uL — ABNORMAL LOW (ref 150–400)
RBC: 2.57 MIL/uL — ABNORMAL LOW (ref 3.87–5.11)
RDW: 22.5 % — ABNORMAL HIGH (ref 11.5–15.5)
Smear Review: DECREASED
WBC: 3.8 10*3/uL — ABNORMAL LOW (ref 4.0–10.5)
nRBC: 1.3 % — ABNORMAL HIGH (ref 0.0–0.2)

## 2023-04-06 LAB — LACTATE DEHYDROGENASE: LDH: 1252 U/L — ABNORMAL HIGH (ref 98–192)

## 2023-04-06 LAB — GLUCOSE, CAPILLARY
Glucose-Capillary: 79 mg/dL (ref 70–99)
Glucose-Capillary: 97 mg/dL (ref 70–99)
Glucose-Capillary: 158 mg/dL — ABNORMAL HIGH (ref 70–99)

## 2023-04-06 MED ORDER — IOHEXOL 300 MG/ML  SOLN
30.0000 mL | Freq: Once | INTRAMUSCULAR | Status: DC | PRN
Start: 2023-04-06 — End: 2023-04-09

## 2023-04-06 MED ORDER — MORPHINE SULFATE (PF) 2 MG/ML IV SOLN
2.0000 mg | INTRAVENOUS | Status: DC | PRN
  Administered 2023-04-06: 2 mg via INTRAVENOUS
  Administered 2023-04-07: 1 mg via INTRAVENOUS
  Administered 2023-04-07 – 2023-04-08 (×3): 2 mg via INTRAVENOUS
  Filled 2023-04-06 (×5): qty 1

## 2023-04-06 MED ORDER — KETOROLAC TROMETHAMINE 15 MG/ML IJ SOLN
15.0000 mg | Freq: Four times a day (QID) | INTRAMUSCULAR | Status: DC | PRN
  Administered 2023-04-07 (×2): 15 mg via INTRAVENOUS
  Filled 2023-04-06 (×2): qty 1

## 2023-04-06 MED ORDER — KETOROLAC TROMETHAMINE 30 MG/ML IJ SOLN
30.0000 mg | Freq: Once | INTRAMUSCULAR | Status: AC
  Administered 2023-04-06: 30 mg via INTRAVENOUS
  Filled 2023-04-06: qty 1

## 2023-04-06 NOTE — Plan of Care (Signed)

## 2023-04-06 NOTE — Progress Notes (Signed)
 Dr Allena Katz made aware that pt continues to complain of abdominal pain, too early for PRN tramadol, per Dr Allena Katz order 1 time dose IV toradol 30mg  for pain

## 2023-04-06 NOTE — Progress Notes (Signed)
 [facility]  - [place] at [facility]   PATIENT NAME: Danielle [name]    MR#:  [id number]  DATE OF BIRTH:  Nov 25, 1975  SUBJECTIVE:   Patient has been constipated for more than six days. Has several good bowel movements followed with diarrhea after using meds for constipation. Patient continues to have this morning belching, bloating, burning sensation in the epigastric area. She also has nausea. Does not like the sound of food. No vomiting.  VITALS:  Blood pressure 105/61, pulse 72, temperature 98.2 F (36.8 C), resp. rate 18, height 5\' 3"  (1.6 m), weight 99.5 kg, last menstrual period 11/21/2020, SpO2 100%.  PHYSICAL EXAMINATION:   GENERAL:  48 y.o.-year-old patient with mild  acute distress. Morbid obesity LUNGS: Normal breath sounds bilaterally, no wheezing CARDIOVASCULAR: S1, S2 normal. No murmur   ABDOMEN: Soft, diffusely tender more epigastric area, nondistended.  EXTREMITIES: No  edema b/l.    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   Patient was sent from outpatient hematology/oncology clinic.   Severe autoimmune hemolytic anemia thrombocytopenia Presented with symptomatic anemia, Hb 6.8 --Elevated LDH level and indirect bilirubin due to  hemolysis --DAT negative, Steroid responsive --S/p 2 unit of PRBC transfusion given on 3/5 --S/p Solu-Medrol IV 100 mg x 1 dose given on 3/5, tolerated well with no reaction --S/p IV Solu-Medrol 250 mg time 1 dose given and patient developed anaphylactic reaction, choking sensation, difficulty breathing which resolved after EpiPen.  Patient also did Benadryl 50 mg IV --Patient was seen by heme-onc Dr. Donneta Romberg recommends oral prednisone and consider IVIG /future splenectomy depending on labs  --LDH 2338 --1679 --Hb 8.0--7.7 -- discussed with Dr. Donneta Romberg-- giving another unit of blood transfusion today. Will move out of ICU. Since LDH is improving no immediate need for IV IG --LDH still trending down and hemoglobin stable after one unit blood transfusion yesterday  Abdominal pain/constipation/epigastric discomfort with bulging and bloating -- KUB showed stool-- received meds-- patient had several rounds of bowel movement along with diarrhea -- continues with epigastric pain/belching and bloating-- getting Protonix BID and Maalox/Mylanta. Discussed with patient this could be attributed by high doses of prednisone  -- will get CT scan of the abdomen/pelvis with PO contrast -- consider G.I. consultation if needed -- EGD and colonoscopy in April 2024 were essentially normal -- continue PRN tramadol. Avoiding narcotics.  Anaphylactic reaction developed soon after receiving Solu-Medrol IV  250 mg on 3/6 Patient received Benadryl 50 mg, anaphylactic reaction resolved after receiving EpiPen IM injection -- now stable. Tolerating PO prednisone  Hypokalemia -- replete electrolytes  Mild hyperglycemia  suspected due to steroids -- SSI  Gerd -- continue PPI  Obesity class II   Procedures: Family communication : none today Consults : oncology hematology CODE STATUS: full DVT Prophylaxis : none due to low platelets Level of care: Med-Surg Status is: Inpatient Remains inpatient  appropriate because: severe hemolytic anemia    TOTAL TIME TAKING CARE OF THIS PATIENT: 40 minutes.  >50% time spent on counselling and coordination of care  Note: This dictation was prepared with Dragon dictation along with smaller phrase technology. Any transcriptional errors that result from this process are unintentional.  [name] M.D    [facility]   CC: Primary care physician; Armando Gang, FNP

## 2023-04-06 NOTE — Progress Notes (Signed)
 Danielle [name]   DOB:1975-02-26   ZO#:[id number]    Subjective: O patient complains of abdominal discomfort/epigastric pain.  Also complains of reflux with reflux.  Positive for belching.  Had a bowel movement this afternoon.  Patient having difficulty with eating because of abdominal pain.  Objective:  Vitals:   04/06/23 0434 04/06/23 0742  BP: 105/65 105/61  Pulse: 78 72  Resp:  18  Temp:  98.2 F (36.8 C)  SpO2:  100%     Intake/Output Summary (Last 24 hours) at 04/06/2023 1430 Last data filed at 04/05/2023 1830 Gross per 24 hour  Intake 344.83 ml  Output --  Net 344.83 ml   No abdominal rigidity or guarding.  Positive for tenderness in the epigastric region. Physical Exam Vitals and nursing note reviewed.  HENT:     Head: Normocephalic and atraumatic.     Mouth/Throat:     Pharynx: Oropharynx is clear.  Eyes:     Extraocular Movements: Extraocular movements intact.     Pupils: Pupils are equal, round, and reactive to light.  Cardiovascular:     Rate and Rhythm: Normal rate and regular rhythm.  Pulmonary:     Comments: Decreased breath sounds bilaterally.  Abdominal:     Palpations: Abdomen is soft.  Musculoskeletal:        General: Normal range of motion.     Cervical back: Normal range of motion.  Skin:    General: Skin is warm.  Neurological:     General: No focal deficit present.     Mental Status: She is alert and oriented to person, place, and time.  Psychiatric:        Behavior: Behavior normal.        Judgment: Judgment normal.      Labs:  Lab Results  Component Value Date   WBC 3.8 (L) 04/06/2023   HGB 8.8 (L) 04/06/2023   HCT 24.6 (L) 04/06/2023   MCV 95.7 04/06/2023   PLT 53 (L) 04/06/2023   NEUTROABS 2.0 04/06/2023    Lab Results  Component Value Date   NA 135 04/04/2023   K 4.2 04/04/2023   CL 102 04/04/2023   CO2 28 04/04/2023    Studies:  DG Abd 1 View Result Date: 04/05/2023 CLINICAL DATA:  [id number] Abdominal pain [id number] EXAM:  ABDOMEN - 1 VIEW COMPARISON:  None Available. FINDINGS: No disproportionately dilated small bowel loops. Mild colonic stool. No evidence of pneumatosis or pneumoperitoneum. Clear lung bases. No pathologic soft tissue calcifications. IMPRESSION: Nonobstructive bowel gas pattern. Mild colonic stool. Electronically Signed   By: Delbert Phenix M.D.   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   # Acute on chronic hemolytic anemia-hemoglobin on admission [3/5]-6.8.  Status post PRBC transfusion and also status post IV Solu-Medrol- [see below]-hemoglobin today is 8.2/however LDH is rising-overall stable.  Starting 3/07-  on  prednisone today 100mg /day.  LDH improving hemoglobin stable 8.8 s/p 1 more unit of PRBC transfusion yesterday.  Continue prednisone 100 mg at this time.  Consider taper outpatient based upon hemoglobin response.  Hold off IVIG.  # Thrombocytopenia again suspect autoimmune-platelets 50s monitor on discharge at this time.  # Anaphylactic reaction-severe- ? IV solumderol-discussed with pharmacy.  Hold off any Solu-Medrol.   # Abdominal discomfort nausea-likely secondary to acute gastritis from steroids-on PPI-however worse.  Awaiting CT scan this afternoon.  Discussed with Dr. Allena Katz.  # Continue  DVT prophylaxis 40 mg SQ daily.    Earna Coder, MD 04/06/2023  2:30 PM

## 2023-04-06 NOTE — Progress Notes (Signed)
 Per Dr Allena Katz, dc q4hr CBG- change to daily at 8am, and dc novolog SSI order

## 2023-04-07 ENCOUNTER — Telehealth: Payer: Self-pay | Admitting: Pharmacy Technician

## 2023-04-07 ENCOUNTER — Other Ambulatory Visit (HOSPITAL_COMMUNITY): Payer: Self-pay

## 2023-04-07 ENCOUNTER — Telehealth: Payer: Self-pay | Admitting: Pharmacist

## 2023-04-07 ENCOUNTER — Inpatient Hospital Stay

## 2023-04-07 ENCOUNTER — Encounter: Payer: Self-pay | Admitting: Internal Medicine

## 2023-04-07 DIAGNOSIS — D649 Anemia, unspecified: Secondary | ICD-10-CM | POA: Diagnosis not present

## 2023-04-07 LAB — CBC WITH DIFFERENTIAL/PLATELET
Abs Immature Granulocytes: 0.05 10*3/uL (ref 0.00–0.07)
Basophils Absolute: 0 10*3/uL (ref 0.0–0.1)
Basophils Relative: 0 %
Eosinophils Absolute: 0 10*3/uL (ref 0.0–0.5)
Eosinophils Relative: 1 %
HCT: 26.7 % — ABNORMAL LOW (ref 36.0–46.0)
Hemoglobin: 9.5 g/dL — ABNORMAL LOW (ref 12.0–15.0)
Immature Granulocytes: 1 %
Lymphocytes Relative: 58 %
Lymphs Abs: 2.6 10*3/uL (ref 0.7–4.0)
MCH: 34.9 pg — ABNORMAL HIGH (ref 26.0–34.0)
MCHC: 35.6 g/dL (ref 30.0–36.0)
MCV: 98.2 fL (ref 80.0–100.0)
Monocytes Absolute: 0.1 10*3/uL (ref 0.1–1.0)
Monocytes Relative: 3 %
Neutro Abs: 1.6 10*3/uL — ABNORMAL LOW (ref 1.7–7.7)
Neutrophils Relative %: 37 %
Platelets: 67 10*3/uL — ABNORMAL LOW (ref 150–400)
RBC: 2.72 MIL/uL — ABNORMAL LOW (ref 3.87–5.11)
RDW: 23.5 % — ABNORMAL HIGH (ref 11.5–15.5)
WBC: 4.4 10*3/uL (ref 4.0–10.5)
nRBC: 1.4 % — ABNORMAL HIGH (ref 0.0–0.2)

## 2023-04-07 LAB — BASIC METABOLIC PANEL
Anion gap: 9 (ref 5–15)
BUN: 13 mg/dL (ref 6–20)
CO2: 25 mmol/L (ref 22–32)
Calcium: 9.2 mg/dL (ref 8.9–10.3)
Chloride: 104 mmol/L (ref 98–111)
Creatinine, Ser: 0.68 mg/dL (ref 0.44–1.00)
GFR, Estimated: >60 mL/min (ref >60)
Glucose, Bld: 112 mg/dL — ABNORMAL HIGH (ref 70–99)
Potassium: 3.2 mmol/L — ABNORMAL LOW (ref 3.5–5.1)
Sodium: 138 mmol/L (ref 135–145)

## 2023-04-07 LAB — GLUCOSE, CAPILLARY: Glucose-Capillary: 80 mg/dL (ref 70–99)

## 2023-04-07 LAB — LIPASE, BLOOD: Lipase: 36 U/L (ref 11–51)

## 2023-04-07 LAB — HEPATIC FUNCTION PANEL
ALT: 28 U/L (ref 0–44)
AST: 22 U/L (ref 15–41)
Albumin: 3.9 g/dL (ref 3.5–5.0)
Alkaline Phosphatase: 42 U/L (ref 38–126)
Bilirubin, Direct: 0.4 mg/dL — ABNORMAL HIGH (ref 0.0–0.2)
Indirect Bilirubin: 1.8 mg/dL — ABNORMAL HIGH (ref 0.3–0.9)
Total Bilirubin: 2.2 mg/dL — ABNORMAL HIGH (ref 0.0–1.2)
Total Protein: 6.2 g/dL — ABNORMAL LOW (ref 6.5–8.1)

## 2023-04-07 LAB — RETICULOCYTES
Immature Retic Fract: 31.5 % — ABNORMAL HIGH (ref 2.3–15.9)
RBC.: 2.7 MIL/uL — ABNORMAL LOW (ref 3.87–5.11)
Retic Count, Absolute: 125.6 10*3/uL (ref 19.0–186.0)
Retic Ct Pct: 4.7 % — ABNORMAL HIGH (ref 0.4–3.1)

## 2023-04-07 LAB — LACTIC ACID, PLASMA: Lactic Acid, Venous: 2.4 mmol/L (ref 0.5–1.9)

## 2023-04-07 LAB — LACTATE DEHYDROGENASE: LDH: 1036 U/L — ABNORMAL HIGH (ref 98–192)

## 2023-04-07 LAB — AMYLASE: Amylase: 48 U/L (ref 28–100)

## 2023-04-07 MED ORDER — SODIUM CHLORIDE 0.9 % IV SOLN
INTRAVENOUS | Status: DC
  Administered 2023-04-08: 125 mL via INTRAVENOUS

## 2023-04-07 MED ORDER — PREDNISONE 50 MG PO TABS
60.0000 mg | ORAL_TABLET | Freq: Every day | ORAL | Status: DC
  Administered 2023-04-08 – 2023-04-09 (×2): 60 mg via ORAL
  Filled 2023-04-07 (×2): qty 1

## 2023-04-07 MED ORDER — PANTOPRAZOLE SODIUM 40 MG IV SOLR
40.0000 mg | Freq: Two times a day (BID) | INTRAVENOUS | Status: DC
  Administered 2023-04-07 – 2023-04-09 (×4): 40 mg via INTRAVENOUS
  Filled 2023-04-07 (×4): qty 10

## 2023-04-07 MED ORDER — ONDANSETRON HCL 4 MG/2ML IJ SOLN
4.0000 mg | Freq: Four times a day (QID) | INTRAMUSCULAR | Status: DC | PRN
  Administered 2023-04-07: 4 mg via INTRAVENOUS
  Filled 2023-04-07: qty 2

## 2023-04-07 NOTE — Telephone Encounter (Signed)
 Oral Oncology Patient Advocate Encounter   Received notification that prior authorization for Marisa Sprinkles is required.   PA submitted on 04/07/2023 Key [id number] Status is pending     Patty Almedia Balls, CPhT Oncology Pharmacy Patient Advocate [facility] Direct Number: [phone] Fax: [phone]

## 2023-04-07 NOTE — Telephone Encounter (Signed)
 Clinical Pharmacist Practitioner Encounter   Received new prescription for Tav[place] (fostamatinib)  for the treatment of Thrombocytopenia (MD suspects autoimmune component), planned duration until disease progression or unacceptable drug toxicity.  Hepatic panel from 04/05/23 and CBC from 04/07/23 assessed, no relevant lab abnormalities. BP appears well controlled, continue to monitor.   Prescription dose and frequency assessed.   Current medication list in Epic reviewed, no DDIs with fostamatinib identified.  Evaluated chart and no patient barriers to medication adherence identified.   Prescription has been e-scribed to the [facility] for benefits analysis and approval.  Oral Oncology Clinic will continue to follow for insurance authorization, copayment issues, initial counseling and start date.   Remi Haggard, PharmD, BCOP, CPP Hematology/Oncology Clinical Pharmacist [facility] Oral Chemotherapy Navigation Clinic [phone]  04/07/2023 12:57 PM

## 2023-04-07 NOTE — Progress Notes (Signed)
 [facility]  - [place] at [facility]   PATIENT NAME: Danielle [name]    MR#:  [id number]  DATE OF BIRTH:  December 22, 1975  SUBJECTIVE:   Says constipation resolved now with loose stools. Ongoing ruq pain radiating to back  VITALS:  Blood pressure 114/65, pulse 72, temperature 98 F (36.7 C), temperature source Oral, resp. rate 18, height 5\' 3"  (1.6 m), weight 100.2 kg, last menstrual period 11/21/2020, SpO2 100%.  PHYSICAL EXAMINATION:   GENERAL:  48 y.o.-year-old patient with mild  acute distress. Morbid obesity LUNGS: Normal breath sounds bilaterally, no wheezing CARDIOVASCULAR: S1, S2 normal. No murmur   ABDOMEN: Soft, no sig tenderness, bs all 4 EXTREMITIES: No  edema b/l.    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   Patient was sent from outpatient hematology/oncology clinic.   Severe autoimmune hemolytic anemia thrombocytopenia Presented with symptomatic anemia, Hb 6.8 S/p 3 units prbcs, on prednisone currently, decreased to 60 today, hematology is following  RUQ pain U/s on 3/5, non-con ct on 3/10, labs fail to show cause of pain. No skin lesion or neurologic symptoms. With some nbnb emesis, also a lot of belching. May be gastritis from prednisone. Kub  today non-obstructive bowel pattern. Ct on 3/10 did show moderate gallbladder distention but no stones or inflammation or biliary dilation - will repeat ruq u/s - consider hida scan if negative - may need to involve gen surg and/or gi - switch ppi to IV bid - stop toradol - f/u lactic acid  Anaphylactic reaction developed soon after receiving Solu-Medrol IV  250 mg on 3/6 Patient received Benadryl 50 mg, anaphylactic reaction resolved after receiving EpiPen IM injection -- now stable. Tolerating PO prednisone  Hypokalemia -- replete electrolytes  Mild hyperglycemia suspected due to steroids -- SSI  Gerd -- continue PPI  Obesity class II   Procedures: Family communication : none today Consults : oncology hematology CODE STATUS: full DVT Prophylaxis : none due to low platelets Level of care: Med-Surg Status is: Inpatient Remains inpatient appropriate because: unrelenting ruq abd pain     Silvano Bilis M.D    [facility]

## 2023-04-07 NOTE — Telephone Encounter (Addendum)
 Oral Oncology Patient Advocate Encounter  Received notification that the request for prior authorization for Danielle [name] has been denied due to One of the following: (1) You have failed two preferred drugs as confirmed by claims history or submission of medical records. The preferred drugs: Nplate vial, Promacta suspension / tablet. (2) You cannot use two preferred drugs (please specify contraindication or intolerance). The information provided does not show that you meet the criteria listed above.Danielle [name], CPhT Oncology Pharmacy Patient Advocate [facility] Direct Number: [phone] Fax: [phone]

## 2023-04-07 NOTE — Plan of Care (Signed)
 Was called into pt's room by daughter saying she was in pain.  Arrive in room to find pt is crying and rolling back and forth w/pain of 10/10.  Pt said pain was occurring R abdomen and radiating to back.  Reached out to on call MD, Dr. Arville Care.  Pt had no pain meds ordered.  He put in order for morphine for severe pain and toradol for moderate.  Gave the morphine and pt experienced relief.

## 2023-04-07 NOTE — Plan of Care (Signed)
 After initial pain of 10/10 at shift change, Pain became much more manageable.

## 2023-04-07 NOTE — Progress Notes (Signed)
 Danielle [name]   DOB:1975-11-20   ZO#:[id number]    Subjective: O patient continues to complain of abdominal discomfort/epigastric pain radiating to back. Also complains of reflux.   Positive for belching. Patient having difficulty with eating because of abdominal pain.  Objective:  Vitals:   04/07/23 1705 04/07/23 2137  BP: 116/66 (!) 98/45  Pulse: 77 72  Resp: 18 18  Temp: 99.3 F (37.4 C) 97.8 F (36.6 C)  SpO2: 99% 100%     Intake/Output Summary (Last 24 hours) at 04/07/2023 2234 Last data filed at 04/07/2023 1900 Gross per 24 hour  Intake 0 ml  Output --  Net 0 ml   No abdominal rigidity or guarding.  Positive for tenderness in the epigastric region.  Physical Exam Vitals and nursing note reviewed.  HENT:     Head: Normocephalic and atraumatic.     Mouth/Throat:     Pharynx: Oropharynx is clear.  Eyes:     Extraocular Movements: Extraocular movements intact.     Pupils: Pupils are equal, round, and reactive to light.  Cardiovascular:     Rate and Rhythm: Normal rate and regular rhythm.  Pulmonary:     Comments: Decreased breath sounds bilaterally.  Abdominal:     Palpations: Abdomen is soft.  Musculoskeletal:        General: Normal range of motion.     Cervical back: Normal range of motion.  Skin:    General: Skin is warm.  Neurological:     General: No focal deficit present.     Mental Status: She is alert and oriented to person, place, and time.  Psychiatric:        Behavior: Behavior normal.        Judgment: Judgment normal.      Labs:  Lab Results  Component Value Date   WBC 4.4 04/07/2023   HGB 9.5 (L) 04/07/2023   HCT 26.7 (L) 04/07/2023   MCV 98.2 04/07/2023   PLT 67 (L) 04/07/2023   NEUTROABS 1.6 (L) 04/07/2023    Lab Results  Component Value Date   NA 138 04/07/2023   K 3.2 (L) 04/07/2023   CL 104 04/07/2023   CO2 25 04/07/2023    Studies:  DG Abd 1 View Result Date: 04/07/2023 CLINICAL DATA:  Nausea and vomiting. EXAM: ABDOMEN - 1  VIEW COMPARISON:  CT yesterday FINDINGS: There is enteric contrast throughout the colon. No small bowel distension. There is mild gaseous gastric distension. Left renal stone on CT is not well seen by radiograph. Unchanged pelvic phleboliths. IMPRESSION: 1. Mild gaseous gastric distension. Recommend correlation for gastroenteritis. 2. Otherwise normal bowel gas pattern. 3. Left renal stone on CT is not seen on the current exam. Electronically Signed   By: Narda Rutherford M.D.   On: 04/07/2023 15:54   CT ABDOMEN PELVIS WO CONTRAST Result Date: 04/06/2023 CLINICAL DATA:  Acute abdominal pain. EXAM: CT ABDOMEN AND PELVIS WITHOUT CONTRAST TECHNIQUE: Multidetector CT imaging of the abdomen and pelvis was performed following the standard protocol without IV contrast. RADIATION DOSE REDUCTION: This exam was performed according to the departmental dose-optimization program which includes automated exposure control, adjustment of the mA and/or kV according to patient size and/or use of iterative reconstruction technique. COMPARISON:  Ultrasound 03/31/2023.  No prior CT available FINDINGS: Lower chest: Dependent atelectasis in the left lower lobe. No significant pleural effusion. Hepatobiliary: No focal liver abnormality on this unenhanced exam. Moderate gallbladder distention without calcified gallstone or pericholecystic inflammation. No biliary dilatation. Pancreas:  No ductal dilatation or inflammation. Spleen: Upper normal in size, 12.2 cm cranial caudal. No focal abnormality. Adrenals/Urinary Tract: Unremarkable adrenal glands. Punctate nonobstructing stone in the mid left kidney. No hydronephrosis or renal inflammation. No evidence of focal renal abnormality on this unenhanced exam. Partially distended urinary bladder, normal for degree of distension. Stomach/Bowel: The stomach is nondistended. There is no small bowel obstruction or inflammatory change. Enteric contrast is seen throughout the colon. Normal appendix.  Slight increased air in the transverse colon without abnormal distension. Minimal formed stool in the colon. No colonic wall thickening or pericolonic edema. Vascular/Lymphatic: Normal caliber abdominal aorta. No portal venous or mesenteric gas. No abdominopelvic adenopathy. Reproductive: Uterus and bilateral adnexa are unremarkable. Other: No free air, free fluid, or intra-abdominal fluid collection. Diminutive fat containing umbilical hernia Musculoskeletal: There are no acute or suspicious osseous abnormalities. IMPRESSION: 1. No acute abnormality or explanation for abdominal pain. 2. Punctate nonobstructing left renal stone. Electronically Signed   By: Narda Rutherford M.D.   On: 04/06/2023 40:33    48 year old female patient with history of acute on chronic hemolytic anemia admitted to hospital for symptomatic anemia.   # Acute on chronic hemolytic anemia-hemoglobin on admission [3/5]-6.8.  Status post PRBC transfusion and also status post IV Solu-Medrol- [see below]-  #  LDH improving / hemoglobin 9.5- stable. # Starting 3/07- on  prednisone today 100mg /day; # 3/11- started on prednisone 60 mg/day [given worsening epigastric pain]    # Thrombocytopenia again suspect autoimmune-platelets 50s monitor for now.   # Anaphylactic reaction-severe- ? IV solumderol-discussed with pharmacy.  Hold off any Solu-Medrol.   # Abdominal discomfort nausea-likely secondary to acute gastritis from steroids-on PPI-however worse.  3/10-  CT scan AP- no acute process. Consider IV PPI. Consider GI evaluation if no improvement in abdominal pain.   # Continue DVT prophylaxis 40 mg SQ daily. Discussed with Dr.Wouk.   Earna Coder, MD 04/07/2023  10:34 PM

## 2023-04-08 ENCOUNTER — Other Ambulatory Visit (HOSPITAL_COMMUNITY): Payer: Self-pay

## 2023-04-08 ENCOUNTER — Telehealth: Payer: Self-pay | Admitting: Pharmacy Technician

## 2023-04-08 ENCOUNTER — Inpatient Hospital Stay: Admitting: Anesthesiology

## 2023-04-08 ENCOUNTER — Inpatient Hospital Stay

## 2023-04-08 ENCOUNTER — Encounter: Payer: Self-pay | Admitting: Internal Medicine

## 2023-04-08 ENCOUNTER — Encounter
Admission: EM | Disposition: A | Discharge status: Home / Self Care | Payer: Self-pay | Source: Home / Self Care | Attending: Internal Medicine

## 2023-04-08 DIAGNOSIS — D649 Anemia, unspecified: Secondary | ICD-10-CM | POA: Diagnosis not present

## 2023-04-08 HISTORY — PX: ESOPHAGOGASTRODUODENOSCOPY: SHX5428

## 2023-04-08 LAB — CBC
HCT: 22.2 % — ABNORMAL LOW (ref 36.0–46.0)
Hemoglobin: 7.9 g/dL — ABNORMAL LOW (ref 12.0–15.0)
MCH: 35.3 pg — ABNORMAL HIGH (ref 26.0–34.0)
MCHC: 35.6 g/dL (ref 30.0–36.0)
MCV: 99.1 fL (ref 80.0–100.0)
Platelets: 66 10*3/uL — ABNORMAL LOW (ref 150–400)
RBC: 2.24 MIL/uL — ABNORMAL LOW (ref 3.87–5.11)
RDW: 23.8 % — ABNORMAL HIGH (ref 11.5–15.5)
WBC: 4.5 10*3/uL (ref 4.0–10.5)
nRBC: 0.7 % — ABNORMAL HIGH (ref 0.0–0.2)

## 2023-04-08 LAB — BASIC METABOLIC PANEL
Anion gap: 9 (ref 5–15)
BUN: 13 mg/dL (ref 6–20)
CO2: 25 mmol/L (ref 22–32)
Calcium: 8.9 mg/dL (ref 8.9–10.3)
Chloride: 107 mmol/L (ref 98–111)
Creatinine, Ser: 0.68 mg/dL (ref 0.44–1.00)
GFR, Estimated: >60 mL/min (ref >60)
Glucose, Bld: 88 mg/dL (ref 70–99)
Potassium: 3.5 mmol/L (ref 3.5–5.1)
Sodium: 141 mmol/L (ref 135–145)

## 2023-04-08 LAB — GLUCOSE, CAPILLARY: Glucose-Capillary: 71 mg/dL (ref 70–99)

## 2023-04-08 LAB — HCG, QUANTITATIVE, PREGNANCY: hCG, Beta Chain, Quant, S: 1 m[IU]/mL (ref <5)

## 2023-04-08 LAB — LACTATE DEHYDROGENASE: LDH: 729 U/L — ABNORMAL HIGH (ref 98–192)

## 2023-04-08 SURGERY — EGD (ESOPHAGOGASTRODUODENOSCOPY)
Anesthesia: General

## 2023-04-08 MED ORDER — SODIUM CHLORIDE 0.9 % IV SOLN: INTRAVENOUS | Status: AC

## 2023-04-08 MED ORDER — PROPOFOL 500 MG/50ML IV EMUL
INTRAVENOUS | Status: DC | PRN
  Administered 2023-04-08: 155 ug/kg/min via INTRAVENOUS

## 2023-04-08 MED ORDER — LIDOCAINE HCL (CARDIAC) PF 100 MG/5ML IV SOSY
PREFILLED_SYRINGE | INTRAVENOUS | Status: DC | PRN
  Administered 2023-04-08: 100 mg via INTRAVENOUS

## 2023-04-08 MED ORDER — PROPOFOL 10 MG/ML IV BOLUS
INTRAVENOUS | Status: DC | PRN
  Administered 2023-04-08: 20 mg via INTRAVENOUS
  Administered 2023-04-08: 60 mg via INTRAVENOUS
  Administered 2023-04-08: 20 mg via INTRAVENOUS

## 2023-04-08 NOTE — Progress Notes (Signed)
 Patient with ongoing right upper quadrant pain, CT abdomen/pevis and [place] RUQ on 3/4 without acute abnormality. Reviewed repeat US RUQ 03/28/23 as below:  IMPRESSION: 1. Gallbladder sludge without evidence of cholelithiasis or acute cholecystitis. 2. Hepatic steatosis.   Webb Silversmith, DNP, CCRN, FNP-C, AGACNP-BC Acute Care & Family Nurse Practitioner  [facility]  See Amion for personal pager PCCM on call pager [phone] until 7 am

## 2023-04-08 NOTE — Progress Notes (Signed)
 [facility]  - [place] at [facility]   PATIENT NAME: Danielle [name]    MR#:  [id number]  DATE OF BIRTH:  1975-11-17  SUBJECTIVE:   Pain has subsided which it does when not moving around much. hungry  VITALS:  Blood pressure 116/78, pulse 77, temperature (!) 97 F (36.1 C), temperature source Temporal, resp. rate 16, height 5\' 3"  (1.6 m), weight 100.2 kg, last menstrual period 11/21/2020, SpO2 98%.  PHYSICAL EXAMINATION:   GENERAL:  48 y.o.-year-old patient with mild  acute distress. Morbid obesity LUNGS: Normal breath sounds bilaterally, no wheezing CARDIOVASCULAR: S1, S2 normal. No murmur   ABDOMEN: Soft, no sig tenderness, bs all 4 EXTREMITIES: No  edema b/l.    NEUROLOGIC: nonfocal  patient is alert and awake  LABORATORY PANEL:  CBC Recent Labs  Lab 04/08/23 0502  WBC 4.5  HGB 7.9*  HCT 22.2*  PLT 66*    Chemistries  Recent Labs  Lab 04/05/23 0459 04/07/23 0855 04/07/23 1701 04/08/23 0502  NA  --    < >  --  141  K  --    < >  --  3.5  CL  --    < >  --  107  CO2  --    < >  --  25  GLUCOSE  --    < >  --  88  BUN  --    < >  --  13  CREATININE  --    < >  --  0.68  CALCIUM  --    < >  --  8.9  MG 2.6*  --   --   --   AST 24  --  22  --   ALT 20  --  28  --   ALKPHOS 40  --  42  --   BILITOT 2.7*  --  2.2*  --    < > = values in this interval not displayed.        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   Patient was sent from outpatient hematology/oncology clinic.   Severe autoimmune hemolytic anemia thrombocytopenia Presented with symptomatic anemia, Hb 6.8 S/p 3 units prbcs, on prednisone currently, decreased to 60 , hematology is following.  Hgb has dropped to 7/9 today but ldh is decreasing so heme does not think this is worsening hemolysis  RUQ pain U/s on 3/5, non-con ct on 3/10, labs fail to show cause of pain. No skin lesion or neurologic symptoms. With some nbnb emesis, also a lot of belching. May be gastritis from prednisone. Kub non-obstructive bowel pattern. Ct on 3/10 did show moderate gallbladder distention but no stones or inflammation or biliary dilation. Repeat u/s gallbladder sludge, no signs inflammation. EGD performed today, gastritis but otherwise unremarkable.  - will discuss hida w/ gi - will check mri of thoracic spine as patient says this is a band like pain and exacerbated by movement - gi advises checking for porphyria, have ordered those labs  Anaphylactic reaction developed soon after receiving Solu-Medrol IV  250 mg on 3/6 Patient received Benadryl 50 mg, anaphylactic reaction resolved after receiving EpiPen IM injection -- now stable. Tolerating PO prednisone  Hypokalemia -- replete electrolytes  Mild hyperglycemia suspected due to steroids --  SSI  Genella Rife -- continue PPI  Obesity class II   Procedures: EGD 3/12 Family communication : none today Consults : oncology hematology, GI CODE STATUS: full DVT Prophylaxis : none due to low platelets Level of care: Med-Surg Status is: Inpatient Remains inpatient appropriate because: unrelenting ruq abd pain     Silvano Bilis M.D    [facility]

## 2023-04-08 NOTE — Anesthesia Preprocedure Evaluation (Addendum)
 Anesthesia Evaluation  Patient identified by MRN, date of birth, ID band Patient awake    Reviewed: Allergy & Precautions, NPO status , Patient's Chart, lab work & pertinent test results  History of Anesthesia Complications Negative for: history of anesthetic complications  Airway Mallampati: III  TM Distance: >3 FB Neck ROM: Full    Dental no notable dental hx. (+) Teeth Intact   Pulmonary neg sleep apnea, neg COPD, Patient abstained from smoking.Not current smoker, former smoker   Pulmonary exam normal breath sounds clear to auscultation       Cardiovascular Exercise Tolerance: Good METS(-) hypertension(-) CAD and (-) Past MI (-) dysrhythmias  Rhythm:Regular Rate:Normal - Systolic murmurs    Neuro/Psych  PSYCHIATRIC DISORDERS  Depression    negative neurological ROS     GI/Hepatic ,neg GERD  ,,(+)     (-) substance abuse  +nausea, no vomiting. NPO appropriate.   Endo/Other  neg diabetes    Renal/GU negative Renal ROS     Musculoskeletal   Abdominal  (+) + obese  Peds  Hematology  (+) Blood dyscrasia, anemia   Anesthesia Other Findings Past Medical History: No date: Anemia No date: Depression 04/01/2023: Hepatic steatosis  Reproductive/Obstetrics                             Anesthesia Physical Anesthesia Plan  ASA: 2  Anesthesia Plan: General   Post-op Pain Management: Minimal or no pain anticipated   Induction: Intravenous  PONV Risk Score and Plan: 3 and Propofol infusion, TIVA and Ondansetron  Airway Management Planned: Nasal Cannula  Additional Equipment: None  Intra-op Plan:   Post-operative Plan:   Informed Consent: I have reviewed the patients History and Physical, chart, labs and discussed the procedure including the risks, benefits and alternatives for the proposed anesthesia with the patient or authorized representative who has indicated his/her understanding  and acceptance.     Dental advisory given  Plan Discussed with: CRNA and Surgeon  Anesthesia Plan Comments: (Discussed risks of anesthesia with patient, including possibility of difficulty with spontaneous ventilation under anesthesia necessitating airway intervention, PONV, and rare risks such as cardiac or respiratory or neurological events, and allergic reactions. Discussed the role of CRNA in patient's perioperative care. Patient understands.)       Anesthesia Quick Evaluation

## 2023-04-08 NOTE — Progress Notes (Signed)
 Nutrition Follow-up  DOCUMENTATION CODES:   Obesity unspecified  INTERVENTION:   -RD will follow for diet advancement and resume supplements as appropriate  NUTRITION DIAGNOSIS:   Inadequate oral intake related to acute illness as evidenced by per patient/family report.  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  Reviewed I/O's: +956 ml x 24 hours and +2.5 L since admission  Per MD notes, constipation resolved on 04/06/23 with multiple medications. Pt now with loose stools.   GI has seen pt; plan NPO and EGD today for further work-up. Pt remains on antiemetics and pain control.   Pt sleeping soundly at time of visit. No family present at bedside.   Pt with prior poor oral intake secondary to abdominal pain.   No wt loss since admission.   Medications reviewed and include folic acid, protonix, and prednisone.  Labs reviewed: CBGS: 80 (inpatient orders for glycemic control are none).    Diet Order:   Diet Order             Diet NPO time specified Except for: Sips with Meds  Diet effective midnight           Diet NPO time specified Except for: Sips with Meds  Diet effective midnight                   EDUCATION NEEDS:   Education needs have been addressed  Skin:  Skin Assessment: Reviewed RN Assessment  Last BM:  04/07/23  Height:   Ht Readings from Last 1 Encounters:  04/02/23 5\' 3"  (1.6 m)    Weight:   Wt Readings from Last 1 Encounters:  04/07/23 100.2 kg    Ideal Body Weight:  52.2 kg  BMI:  Body mass index is 39.13  kg/m.  Estimated Nutritional Needs:   Kcal:  1800-2100kcal/day  Protein:  90-105g/day  Fluid:  1.7-1.9L/day    Levada Schilling, RD, LDN, CDCES Registered Dietitian III Certified Diabetes Care and Education Specialist If unable to reach this RD, please use "RD Inpatient" group chat on secure chat between hours of 8am-4 pm daily

## 2023-04-08 NOTE — Telephone Encounter (Signed)
 Oral Oncology Patient Advocate Encounter Submitting new PA with new diagnoses code.   Received notification that prior authorization for Danielle [name] is required.   PA submitted on 04/08/2023 Key [id number] Status is pending     Danielle [name], CPhT Oncology Pharmacy Patient Advocate [facility] Direct Number: [phone] Fax: [phone]

## 2023-04-08 NOTE — Progress Notes (Signed)
 Danielle [name]   DOB:[birth year]   OZ#:[id number]    Subjective: O patient continues to complain of abdominal discomfort/epigastric pain radiating to back. Also complains of reflux.   Positive for belching. Patient having difficulty with eating because of abdominal pain. S/p evaluation with GI- awaiting EGD today  Objective:  Vitals:   04/08/23 0432 04/08/23 0815  BP: 114/68 107/66  Pulse: 67 73  Resp: 18 19  Temp: 97.7 F (36.5 C) 97.8 F (36.6 C)  SpO2: 100% 99%     Intake/Output Summary (Last 24 hours) at 04/08/2023 1230 Last data filed at 04/08/2023 0319 Gross per 24 hour  Intake 956.21 ml  Output --  Net 956.21 ml   No abdominal rigidity or guarding.  Positive for tenderness in the epigastric region.  Physical Exam Vitals and nursing note reviewed.  HENT:     Head: Normocephalic and atraumatic.     Mouth/Throat:     Pharynx: Oropharynx is clear.  Eyes:     Extraocular Movements: Extraocular movements intact.     Pupils: Pupils are equal, round, and reactive to light.  Cardiovascular:     Rate and Rhythm: Normal rate and regular rhythm.  Pulmonary:     Comments: Decreased breath sounds bilaterally.  Abdominal:     Palpations: Abdomen is soft.  Musculoskeletal:        General: Normal range of motion.     Cervical back: Normal range of motion.  Skin:    General: Skin is warm.  Neurological:     General: No focal deficit present.     Mental Status: She is alert and oriented to person, place, and time.  Psychiatric:        Behavior: Behavior normal.        Judgment: Judgment normal.      Labs:  Lab Results  Component Value Date   WBC 4.5 04/08/2023   HGB 7.9 (L) 04/08/2023   HCT 22.2 (L) 04/08/2023   MCV 99.1 04/08/2023   PLT 66 (L) 04/08/2023   NEUTROABS 1.6 (L) 04/07/2023    Lab Results  Component Value Date   NA 141 04/08/2023   K 3.5 04/08/2023   CL 107 04/08/2023   CO2 25 04/08/2023    Studies:  US Abdomen Limited RUQ (LIVER/GB) Result Date:  04/08/2023 CLINICAL DATA:  Right upper quadrant pain. EXAM: ULTRASOUND ABDOMEN LIMITED RIGHT UPPER QUADRANT COMPARISON:  March 31, 2023 FINDINGS: Gallbladder: A moderate amount of echogenic sludge is seen within the gallbladder lumen. No gallstones or wall thickening visualized (2.3 mm). No sonographic Murphy sign noted by sonographer. Common bile duct: Diameter: 3.0 mm Liver: No focal lesion identified. Diffusely increased echogenicity of the liver parenchyma is noted. Portal vein is patent on color Doppler imaging with normal direction of blood flow towards the liver. Other: None. IMPRESSION: 1. Gallbladder sludge without evidence of cholelithiasis or acute cholecystitis. 2. Hepatic steatosis. Electronically Signed   By: Aram Candela M.D.   On: 04/08/2023 02:07   DG Abd 1 View Result Date: 04/07/2023 CLINICAL DATA:  Nausea and vomiting. EXAM: ABDOMEN - 1 VIEW COMPARISON:  CT yesterday FINDINGS: There is enteric contrast throughout the colon. No small bowel distension. There is mild gaseous gastric distension. Left renal stone on CT is not well seen by radiograph. Unchanged pelvic phleboliths. IMPRESSION: 1. Mild gaseous gastric distension. Recommend correlation for gastroenteritis. 2. Otherwise normal bowel gas pattern. 3. Left renal stone on CT is not seen on the current exam. Electronically Signed  By: Narda Rutherford M.D.   On: 04/07/2023 15:54   CT ABDOMEN PELVIS WO CONTRAST Result Date: 04/06/2023 CLINICAL DATA:  Acute abdominal pain. EXAM: CT ABDOMEN AND PELVIS WITHOUT CONTRAST TECHNIQUE: Multidetector CT imaging of the abdomen and pelvis was performed following the standard protocol without IV contrast. RADIATION DOSE REDUCTION: This exam was performed according to the departmental dose-optimization program which includes automated exposure control, adjustment of the mA and/or kV according to patient size and/or use of iterative reconstruction technique. COMPARISON:  Ultrasound 03/31/2023.  No  prior CT available FINDINGS: Lower chest: Dependent atelectasis in the left lower lobe. No significant pleural effusion. Hepatobiliary: No focal liver abnormality on this unenhanced exam. Moderate gallbladder distention without calcified gallstone or pericholecystic inflammation. No biliary dilatation. Pancreas: No ductal dilatation or inflammation. Spleen: Upper normal in size, 12.2 cm cranial caudal. No focal abnormality. Adrenals/Urinary Tract: Unremarkable adrenal glands. Punctate nonobstructing stone in the mid left kidney. No hydronephrosis or renal inflammation. No evidence of focal renal abnormality on this unenhanced exam. Partially distended urinary bladder, normal for degree of distension. Stomach/Bowel: The stomach is nondistended. There is no small bowel obstruction or inflammatory change. Enteric contrast is seen throughout the colon. Normal appendix. Slight increased air in the transverse colon without abnormal distension. Minimal formed stool in the colon. No colonic wall thickening or pericolonic edema. Vascular/Lymphatic: Normal caliber abdominal aorta. No portal venous or mesenteric gas. No abdominopelvic adenopathy. Reproductive: Uterus and bilateral adnexa are unremarkable. Other: No free air, free fluid, or intra-abdominal fluid collection. Diminutive fat containing umbilical hernia Musculoskeletal: There are no acute or suspicious osseous abnormalities. IMPRESSION: 1. No acute abnormality or explanation for abdominal pain. 2. Punctate nonobstructing left renal stone. Electronically Signed   By: Narda Rutherford M.D.   On: 04/06/2023 49:68    48 year old female patient with history of acute on chronic hemolytic anemia admitted to hospital for symptomatic anemia.   # Acute on chronic hemolytic anemia-hemoglobin on admission [3/5]-6.8.  Status post PRBC transfusion and also status post IV Solu-Medrol- [see below]-  #  LDH improving however hemoglobin 7.4- worsening- ? GI bleed vs others-  awaiting EGD today.  # Starting 3/07- on  prednisone today 100mg /day; # 3/11- started on prednisone 60 mg/day [given worsening epigastric pain]    # Thrombocytopenia again suspect autoimmune-platelets 50s monitor for now. stable  # Anaphylactic reaction-severe- ? IV solumderol-discussed with pharmacy.  Hold off any Solu-Medrol.   # Abdominal discomfort nausea-likely secondary to acute gastritis from steroids-on PPI-however worse.  3/10-  CT scan AP- no acute process.s/p GI evaluation- await EGD.    # Continue DVT prophylaxis 40 mg SQ daily. Discussed with Dr.Wouk.   Earna Coder, MD 04/08/2023  12:30 PM

## 2023-04-08 NOTE — Transfer of Care (Signed)
 Immediate Anesthesia Transfer of Care Note  Patient: Danielle [name]  Procedure(s) Performed: EGD (ESOPHAGOGASTRODUODENOSCOPY)  Patient Location: Endoscopy Unit  Anesthesia Type:General  Level of Consciousness: drowsy and patient cooperative  Airway & Oxygen Therapy: Patient Spontanous Breathing and Patient connected to face mask oxygen  Post-op Assessment: Report given to RN and Post -op Vital signs reviewed and stable  Post vital signs: Reviewed and stable  Last Vitals:  Vitals Value Taken Time  BP 101/51 04/08/23 1309  Temp    Pulse 78 04/08/23 1310  Resp 18 04/08/23 1310  SpO2 100 % 04/08/23 1310  Vitals shown include unfiled device data.  Last Pain:  Vitals:   04/08/23 1235  TempSrc: Temporal  PainSc: 0-No pain         Complications: No notable events documented.

## 2023-04-08 NOTE — Telephone Encounter (Signed)
 Oral Oncology Patient Advocate Encounter  Received notification that the request for prior authorization for Danielle [name] has been denied due to One of the following: (1) You have failed two preferred drugs as confirmed by claims history or submission of medical records. The preferred drugs: Nplate vial, Promacta suspension / tablet. (2) You cannot use two preferred drugs (please specify contraindication or intolerance). The information provided does not show that you meet the criteria listed above.Danielle [name], CPhT Oncology Pharmacy Patient Advocate [facility] Direct Number: [phone] Fax: [phone]

## 2023-04-08 NOTE — Consult Note (Signed)
 GI Inpatient Consult Note  Reason for Consult: RUQ abdominal pain, nausea/vomiting    Attending Requesting Consult: Dr. Shonna Chock, MD  History of Present Illness: Danielle [name] is a 48 y.o. [name] seen for evaluation of acute RUQ abdominal pain with associated non-bloody and non-bilious emesis at the request of hospitalist - Dr. Shonna Chock. 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 She presented to the [facility] ED 3/5 last week for chief complaint of anemia requiring pRBC transfusion from her hematology clinic. Hemoglobin on admission was 6.8 and she is s/p pRBC transfusion and high dose IV steroids. GI consulted this morning in setting of acute RUQ abdominal pain with associated nausea and vomiting. She had CT abd/pelvis without contrast performed 3/10 which was negative. RUQ abdominal ultrasound performed yesterday commented on gallbladder sludge without evidence of cholelithiasis or acute cholecystitis. Labs have commented on an indirect hyperbilirubinemia (total 2.2, indirect 1.8) with a normal alkaline phosphatase. Patient reports the abdominal pain has been present for the past week since she has been in the hospital. She is very uncomfortable at time of my evaluation. She reports when she gets up and stands on her feet the pain gets really bad. She also reports the pain has been worse after trying to eat or drink anything. The pain is described as an aching pain. She reports pain is currently 10/10 and is requesting pain medication from nursing team. The pain is located in RUQ and radiates around to her back. She has noticed associated symptoms of severe nausea without any recent vomiting, eructation, abdominal bloating, and reflux. She denies any complaints of esophageal dysphagia, odynophagia, early satiety, or hoarseness. She has also had a burning  sensation in her epigastrium. She is not on any daily acid suppression medication at home. She was constipated last week. She denies any grossly bloody or tarry stools.    Summary of GI Procedures:  EGD 05/12/2022 [facility]) - normal esophagus, stomach, and duodenum  CSY 05/12/2022 [facility]) - poor colon prep, colonoscope advanced to the cecum with no gross lesions identified    Past Medical History:  Past Medical History:  Diagnosis Date   Anemia    Depression    Hepatic steatosis 04/01/2023    Problem List: Patient Active Problem List   Diagnosis Date Noted   Acute gastritis without hemorrhage 04/04/2023   Hypokalemia 04/01/2023   Morbid obesity (HCC) 04/01/2023   Hepatic steatosis 04/01/2023   Autoimmune hemolytic anemia, unspecified (HCC) 04/01/2023   At risk for hyperglycemia 04/01/2023   Macrocytic anemia 05/12/2022   Dizziness 05/10/2022   Chest pain 05/10/2022   DOE (dyspnea on exertion) 05/10/2022   Tobacco abuse 05/10/2022   Electrolyte abnormality 05/10/2022   SOB (shortness of breath) 05/10/2022   Obesity (BMI 30-39.9) 05/10/2022   Pulmonary HTN (HCC) 05/10/2022   Symptomatic anemia 11/28/2019   Iron deficiency 11/28/2019    Past Surgical History: Past Surgical History:  Procedure Laterality Date   ABDOMINAL HYSTERECTOMY     COLONOSCOPY WITH PROPOFOL N/A 05/12/2022   Procedure: COLONOSCOPY WITH PROPOFOL;  Surgeon: Midge Minium, MD;  Location: [facility] ENDOSCOPY;  Service: Endoscopy;  Laterality: N/A;   ESOPHAGOGASTRODUODENOSCOPY (EGD) WITH PROPOFOL N/A 05/12/2022   Procedure: ESOPHAGOGASTRODUODENOSCOPY (EGD) WITH PROPOFOL;  Surgeon: Midge Minium, MD;  Location: [facility] ENDOSCOPY;  Service: Endoscopy;  Laterality: N/A;   IR BONE MARROW BIOPSY & ASPIRATION  05/28/2022   TONSILLECTOMY  Allergies: Allergies  Allergen Reactions   Rituxan [Rituximab] Anaphylaxis   Solu-Medrol [Methylprednisolone] Anaphylaxis    Resolved after EpiPen on 04/02/2023 (After Solu-medrol 250 mg  iv, but no reaction after Solu-medrol 100 mg on 04/01/23 weird)    Home Medications: Medications Prior to Admission  Medication Sig Dispense Refill Last Dose/Taking   albuterol (VENTOLIN HFA) 108 (90 Base) MCG/ACT inhaler Inhale 2 puffs into the lungs every 6 (six) hours as needed for wheezing or shortness of breath. 8 g 2 Taking As Needed   famotidine (PEPCID) 20 MG tablet Take 1 tablet (20 mg total) by mouth 2 (two) times daily. 60 tablet 0 03/31/2023   folic acid (FOLVITE) 1 MG tablet Take 1 tablet (1 mg total) by mouth daily. 90 tablet 1 03/31/2023 Morning   mycophenolate (CELLCEPT) 500 MG tablet Take 1 tablet (500 mg total) by mouth 2 (two) times daily. 60 tablet 3 03/31/2023 Evening   Semaglutide-Weight Management (WEGOVY) 0.25 MG/0.5ML SOAJ Inject 0.25 mg into the skin once a week.   03/30/2023   venlafaxine XR (EFFEXOR-XR) 37.5 MG 24 hr capsule Take 37.5 mg by mouth daily.   03/31/2023   aluminum-magnesium hydroxide-simethicone (MAALOX) 200-200-20 MG/5ML SUSP Take 30 mLs by mouth 4 (four) times daily -  before meals and at bedtime. 355 mL 0    metFORMIN (GLUCOPHAGE) 500 MG tablet Take 500 mg by mouth 2 (two) times daily with a meal. (Patient not taking: Reported on 04/01/2023)      Home medication reconciliation was completed with the patient.   Scheduled Inpatient Medications:    feeding supplement  237 mL Oral TID BM   folic acid  1 mg Oral Daily   multivitamin with minerals  1 tablet Oral Daily   pantoprazole (PROTONIX) IV  40 mg Intravenous Q12H   predniSONE  60 mg Oral Q breakfast    Continuous Inpatient Infusions:    sodium chloride 125 mL (04/08/23 0443)    PRN Inpatient Medications:  alum & mag hydroxide-simeth, iohexol, morphine injection, ondansetron (ZOFRAN) IV, senna-docusate, traMADol  Family History: family history includes Breast cancer in her paternal aunt.  The patient's family history is negative for inflammatory bowel disorders, GI malignancy, or solid organ  transplantation.  Social History:   reports that she quit smoking about 10 months ago. Her smoking use included cigarettes. She started smoking about 25 years ago. She has a 12.5 pack-year smoking history. She has never used smokeless tobacco. She reports that she does not currently use alcohol. She reports that she does not use drugs. The patient denies ETOH, tobacco, or drug use.   Review of Systems: Constitutional: Weight is stable.  Eyes: No changes in vision. ENT: No oral lesions, sore throat.  GI: see HPI.  Heme/Lymph: No easy bruising.  CV: No chest pain.  GU: No hematuria.  Integumentary: No rashes.  Neuro: No headaches.  Psych: No depression/anxiety.  Endocrine: No heat/cold intolerance.  Allergic/Immunologic: No urticaria.  Resp: No cough, SOB.  Musculoskeletal: No joint swelling.    Physical Examination: BP 107/66 (BP Location: Left Arm)   Pulse 73   Temp 97.8 F (36.6 C)   Resp 19   Ht 5\' 3"  (1.6 m)   Wt 100.2 kg   LMP 11/21/2020   SpO2 99%   BMI 39.13 kg/m  Gen: NAD, alert and oriented x 4 HEENT: PEERLA, EOMI, Neck: supple, no JVD or thyromegaly Chest: CTA bilaterally, no wheezes, crackles, or other adventitious sounds CV: RRR, no m/g/c/r Abd: soft, obese  abdomen, +BS in all four quadrants; tender to light and deep palpation in RUQ and epigastrium, no HSM, guarding, ridigity, or rebound tenderness Ext: no edema, well perfused with 2+ pulses, Skin: no rash or lesions noted Lymph: no LAD  Data: Lab Results  Component Value Date   WBC 4.5 04/08/2023   HGB 7.9 (L) 04/08/2023   HCT 22.2 (L) 04/08/2023   MCV 99.1 04/08/2023   PLT 66 (L) 04/08/2023   Recent Labs  Lab 04/06/23 0453 04/07/23 0855 04/08/23 0502  HGB 8.8* 9.5* 7.9*   Lab Results  Component Value Date   NA 141 04/08/2023   K 3.5 04/08/2023   CL 107 04/08/2023   CO2 25 04/08/2023   BUN 13 04/08/2023   CREATININE 0.68 04/08/2023   Lab Results  Component Value Date   ALT 28  04/07/2023   AST 22 04/07/2023   ALKPHOS 42 04/07/2023   BILITOT 2.2 (H) 04/07/2023   Recent Labs  Lab 04/01/23 1157  APTT 26  INR 1.1    Assessment/Plan:  48 y/o Danielle [name] with a PMH of obesity (BMI 39), severe autoimmune hemolytic anemia-DAT-negative-steroid responsive, and depression presented to the [facility] ED 3/5 last week from her Hematology office after being found to have severe symptomatic anemia (Hgb 6.8) requiring blood transfusion x3. She has been receiving high dose steroids. GI consulted this morning for chief complaint of acute RUQ abdominal pain with associated nausea, NBNB emesis, and dyspepsia symptoms.   Acute RUQ abdominal pain - ddx includes PUD, stress gastritis, esophagitis, duodenitis, biliary dyskinesia, nonulcer dyspepsia, gastroparesis, MSK, etc  NBNB emesis  Dyspepsia  Severe autoimmune hemolytic anemia  Symptomatic anemia  Morbid obesity   Recommendations:  - NPO - Continue supportive care with antiemetics and pain control per primary team - Continue PPI IV BID for gastric protection  - Advise EGD for endoluminal evaluation this afternoon with Dr. Norma Fredrickson - See procedure note for findings and further recommendations  I reviewed the risks (including bleeding, perforation, infection, anesthesia complications, cardiac/respiratory complications), benefits and alternatives of EGD. Patient consents to proceed.    Thank you for the consult. Please call with questions or concerns.  Gilda Crease, PA-C [facility] [phone]

## 2023-04-08 NOTE — Op Note (Addendum)
 [facility] Patient Name: Danielle [name] Procedure Date: 04/08/2023 1:14 PM MRN: [id number] Account #: [id number] Date of Birth: 10-11-1975 Admit Type: Inpatient Age: 48 Room: [facility] ENDO ROOM 2 Gender: Female Note Status: Supervisor Override Instrument Name: Patton Salles Endoscope [phone] Procedure:             Upper GI endoscopy Indications:           Epigastric abdominal pain, Abdominal pain in the right                         upper quadrant Providers:             Kimiko Common K. Bryce Cheever MD, MD Medicines:             Propofol per Anesthesia Complications:         No immediate complications. Estimated blood loss:                         Minimal. Procedure:             Pre-Anesthesia Assessment:                        - The risks and benefits of the procedure and the                         sedation options and risks were discussed with the                         patient. All questions were answered and informed                         consent was obtained.                        - Patient identification and proposed procedure were                         verified prior to the procedure by the nurse. The                         procedure was verified in the procedure room.                        - ASA Grade Assessment: III - A patient with severe                         systemic disease.                        - After reviewing the risks and benefits, the patient                         was deemed in satisfactory condition to undergo the                         procedure.                        After obtaining informed consent, the endoscope was  passed under direct vision. Throughout the procedure,                         the patient's blood pressure, pulse, and oxygen                         saturations were monitored continuously. The Endoscope                         was introduced through the mouth, and advanced to the                          second part of duodenum. The upper GI endoscopy was                         accomplished without difficulty. The patient tolerated                         the procedure well. Findings:      The esophagus was normal.      Patchy mild inflammation characterized by congestion (edema) and       erythema was found in the gastric antrum. Biopsies were taken with a       cold forceps for Helicobacter pylori testing.      The exam of the stomach was otherwise normal.      The examined duodenum was normal. Impression:            - Normal esophagus.                        - Gastritis. Biopsied.                        - Normal examined duodenum. Recommendation:        - Return patient to hospital ward for ongoing care.                        - Resume previous diet.                        - Await pathology results.                        - Check urine porphobilinogen, porphyrins. R/O Acute                         intermittent porphyria.                        - GI service will continue to follow the patient's                         progress and closely observe for any changes in                         clinical status. Procedure Code(s):     --- Professional ---                        [phone], Esophagogastroduodenoscopy, flexible,  transoral; with biopsy, single or multiple Diagnosis Code(s):     --- Professional ---                        R10.11, Right upper quadrant pain                        R10.13, Epigastric pain                        K29.70, Gastritis, unspecified, without bleeding CPT copyright 2022 [employer]. All rights reserved. The codes documented in this report are preliminary and upon coder review may  be revised to meet current compliance requirements. Stanton Kidney MD, MD 04/08/2023 1:32:33 PM This report has been signed electronically. Number of Addenda: 0 Note Initiated On: 04/08/2023 1:14 PM Estimated Blood Loss:   Estimated blood loss was minimal.      [facility]

## 2023-04-08 NOTE — Anesthesia Procedure Notes (Signed)
 Procedure Name: General with mask airway Date/Time: 04/08/2023 12:58 PM  Performed by: Mohammed Kindle, CRNAPre-anesthesia Checklist: Patient identified, Emergency Drugs available, Suction available and Patient being monitored Patient Re-evaluated:Patient Re-evaluated prior to induction Oxygen Delivery Method: Simple face mask Induction Type: IV induction Placement Confirmation: positive ETCO2 and breath sounds checked- equal and bilateral Dental Injury: Teeth and Oropharynx as per pre-operative assessment

## 2023-04-09 ENCOUNTER — Inpatient Hospital Stay
Admit: 2023-04-09 | Discharge: 2023-04-09 | Discharge status: Home / Self Care | Attending: Obstetrics and Gynecology | Admitting: Obstetrics and Gynecology

## 2023-04-09 DIAGNOSIS — D649 Anemia, unspecified: Secondary | ICD-10-CM | POA: Diagnosis not present

## 2023-04-09 LAB — HEPATIC FUNCTION PANEL
ALT: 18 U/L (ref 0–44)
AST: 15 U/L (ref 15–41)
Albumin: 2.9 g/dL — ABNORMAL LOW (ref 3.5–5.0)
Alkaline Phosphatase: 32 U/L — ABNORMAL LOW (ref 38–126)
Bilirubin, Direct: 0.2 mg/dL (ref 0.0–0.2)
Indirect Bilirubin: 1.4 mg/dL — ABNORMAL HIGH (ref 0.3–0.9)
Total Bilirubin: 1.6 mg/dL — ABNORMAL HIGH (ref 0.0–1.2)
Total Protein: 5.1 g/dL — ABNORMAL LOW (ref 6.5–8.1)

## 2023-04-09 LAB — GLUCOSE, CAPILLARY: Glucose-Capillary: 105 mg/dL — ABNORMAL HIGH (ref 70–99)

## 2023-04-09 LAB — BASIC METABOLIC PANEL
Anion gap: 8 (ref 5–15)
BUN: 10 mg/dL (ref 6–20)
CO2: 23 mmol/L (ref 22–32)
Calcium: 8.6 mg/dL — ABNORMAL LOW (ref 8.9–10.3)
Chloride: 108 mmol/L (ref 98–111)
Creatinine, Ser: 0.7 mg/dL (ref 0.44–1.00)
GFR, Estimated: >60 mL/min (ref >60)
Glucose, Bld: 86 mg/dL (ref 70–99)
Potassium: 3.6 mmol/L (ref 3.5–5.1)
Sodium: 139 mmol/L (ref 135–145)

## 2023-04-09 LAB — TYPE AND SCREEN
ABO/RH(D): B POS
Antibody Screen: POSITIVE
Unit division: 0
Unit division: 0
Unit division: 0
Unit division: 0
Unit division: 0

## 2023-04-09 LAB — BPAM RBC
Blood Product Expiration Date: 202503232359
Blood Product Expiration Date: 202503232359
Blood Product Expiration Date: 202504052359
ISSUE DATE / TIME: 202503091535
Unit Type and Rh: 7300
Unit Type and Rh: 7300
Unit Type and Rh: 7300

## 2023-04-09 LAB — CBC
HCT: 23.5 % — ABNORMAL LOW (ref 36.0–46.0)
Hemoglobin: 8 g/dL — ABNORMAL LOW (ref 12.0–15.0)
MCH: 34.6 pg — ABNORMAL HIGH (ref 26.0–34.0)
MCHC: 34 g/dL (ref 30.0–36.0)
MCV: 101.7 fL — ABNORMAL HIGH (ref 80.0–100.0)
Platelets: 75 10*3/uL — ABNORMAL LOW (ref 150–400)
RBC: 2.31 MIL/uL — ABNORMAL LOW (ref 3.87–5.11)
RDW: 25.3 % — ABNORMAL HIGH (ref 11.5–15.5)
WBC: 4.4 10*3/uL (ref 4.0–10.5)
nRBC: 1.6 % — ABNORMAL HIGH (ref 0.0–0.2)

## 2023-04-09 LAB — SURGICAL PATHOLOGY

## 2023-04-09 MED ORDER — PANTOPRAZOLE SODIUM 40 MG PO TBEC: 40.0000 mg | DELAYED_RELEASE_TABLET | Freq: Every day | ORAL | 1 refills | Status: DC

## 2023-04-09 MED ORDER — PREDNISONE 10 MG PO TABS: ORAL_TABLET | ORAL | 0 refills | Status: DC

## 2023-04-09 MED ORDER — TECHNETIUM TC 99M MEBROFENIN IV KIT
5.0000 | PACK | Freq: Once | INTRAVENOUS | Status: AC | PRN
  Administered 2023-04-09: 5.3 via INTRAVENOUS

## 2023-04-09 NOTE — Discharge Summary (Signed)
 Danielle [name]:[id number] DOB: 1975/11/20 DOA: 04/01/2023  PCP: Armando Gang, FNP  Admit date: 04/01/2023 Discharge date: 04/09/2023  Time spent: 35 minutes  Recommendations for Outpatient Follow-up:  Pcp f/u Hematology f/u 1 week     Discharge Diagnoses:  Principal Problem:   Symptomatic anemia Active Problems:   Tobacco abuse   Macrocytic anemia   DOE (dyspnea on exertion)   Pulmonary HTN (HCC)   Iron deficiency   Hypokalemia   Morbid obesity (HCC)   Hepatic steatosis   Autoimmune hemolytic anemia, unspecified (HCC)   At risk for hyperglycemia   Acute gastritis without hemorrhage   Discharge Condition: stable  Diet recommendation: carb modified  Filed Weights   04/06/23 0500 04/07/23 0507 04/09/23 0554  Weight: 99.5 kg 100.2 kg (P) 102.6 kg    History of present illness:  From admission h and p: Danielle [name] is a 48 year old female with history of obesity, right wrist carpal tunnel syndrome, history of symptomatic anemia, iron deficiency anemia, 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     Patient was sent from outpatient hematology/oncology clinic.   Patient reports she has been feeling dizzy and lightheaded for several days prompting her to present to her oncology/hematology clinic.  Labs were obtained outpatient and patient was sent to the ED for blood transfusion and IV steroids.   Patient reports that she feels generalized weakness, discomfort, and dizziness.  She denies known trauma to her person or known sick contacts.  She denies being exposed to the flu or any viral infection that she knows of.   She endorses nausea and vomiting, vomiting up food this morning.  She reports that she had just vomited prior to me coming into the room and that now she feels hungry and would like to eat however she is also feeling dizzy.   She reports  her urine is smelly and very dark in color which she has not noticed before.  She reports she is a water drinker and drinks a lot of water.  Hospital Course:  Patient presents to ER sent from hematology clinic for exacerbation of her autoimmune hemolytic anemia. Hgb 6s on arrival. Received total of 3 units prbcs, hgb stable since then around 8. Treated with corticosteroids. Had a possible anaphylactic reaction to solu-medrol but tolerated prednisone. Ldh down-trending, appears to be responding to steroids, hematology advising continue prednisone 60, hold home cellcept, and f/u in clinic 1 week. Patient also with persistent severe ruq abdomianl pain wrapping around to the back. Worse with movement. Extensive workup pursues (ruq u/s x2, kub x-ray, CT scan, hida scan, EGD, and MRI of thoracic spine). All relatively unremarkable, does have disc bulge at t6, and given movement provokes the pain, this could represent thoracic radiculopathy. The pain is stable, she is tolerating a diet, and eager to go home, as we have ruled out dangerous causes of right upper quadrant pain, think reasonable to discharge. Advise close pcp f/u as well, may need ongoing workup. She does also have gastritis symptoms likely from prednisone - we have added a PPI to her home meds.   Procedures: EGD   Consultations: GI, hematology/oncology  Discharge Exam: Vitals:   04/09/23 0435 04/09/23 1526  BP: (!) 113/54 113/62  Pulse: 72 82  Resp: 18 18  Temp: 97.8 F (36.6 C) 98.4 F (36.9 C)  SpO2: 100% 98%    General: NAD Cardiovascular: RRR Respiratory: CTAB Abdomen: mild  diffuse tenderness, soft, non-distended  Discharge Instructions   Discharge Instructions     Diet Carb Modified   Complete by: As directed    Increase activity slowly   Complete by: As directed       Allergies as of 04/09/2023       Reactions   Rituxan [rituximab] Anaphylaxis   Solu-medrol [methylprednisolone] Anaphylaxis   Resolved after  EpiPen on 04/02/2023 (After Solu-medrol 250 mg iv, but no reaction after Solu-medrol 100 mg on 04/01/23 weird)        Medication List     PAUSE taking these medications    metFORMIN 500 MG tablet Wait to take this until your doctor or other care provider tells you to start again. Commonly known as: GLUCOPHAGE Take 500 mg by mouth 2 (two) times daily with a meal.   mycophenolate 500 MG tablet Wait to take this until your doctor or other care provider tells you to start again. Commonly known as: CELLCEPT Take 1 tablet (500 mg total) by mouth 2 (two) times daily.   Wegovy 0.25 MG/0.5ML Soaj Wait to take this until your doctor or other care provider tells you to start again. Generic drug: Semaglutide-Weight Management Inject 0.25 mg into the skin once a week.       STOP taking these medications    aluminum-magnesium hydroxide-simethicone 200-200-20 MG/5ML Susp Commonly known as: MAALOX       TAKE these medications    albuterol 108 (90 Base) MCG/ACT inhaler Commonly known as: VENTOLIN HFA Inhale 2 puffs into the lungs every 6 (six) hours as needed for wheezing or shortness of breath.   famotidine 20 MG tablet Commonly known as: PEPCID Take 1 tablet (20 mg total) by mouth 2 (two) times daily.   folic acid 1 MG tablet Commonly known as: FOLVITE Take 1 tablet (1 mg total) by mouth daily.   pantoprazole 40 MG tablet Commonly known as: Protonix Take 1 tablet (40 mg total) by mouth daily.   predniSONE 10 MG tablet Commonly known as: DELTASONE 6 tabs daily until hematologist follow-up Start taking on: April 10, 2023   venlafaxine XR 37.5 MG 24 hr capsule Commonly known as: EFFEXOR-XR Take 37.5 mg by mouth daily.       Allergies  Allergen Reactions   Rituxan [Rituximab] Anaphylaxis   Solu-Medrol [Methylprednisolone] Anaphylaxis    Resolved after EpiPen on 04/02/2023 (After Solu-medrol 250 mg iv, but no reaction after Solu-medrol 100 mg on 04/01/23 weird)     Follow-up Information     Armando Gang, FNP Follow up.   Specialty: Family Medicine Why: Hospital follow up Contact information: [address] [place] [address] [phone]         Earna Coder, MD Follow up.   Specialties: Internal Medicine, Oncology Why: in 1 week Contact information: [address] [place] [address] [phone]                  The results of significant diagnostics from this hospitalization (including imaging, microbiology, ancillary and laboratory) are listed below for reference.    Significant Diagnostic Studies: NM Hepato W/EF Result Date: 04/09/2023 CLINICAL DATA:  Chronic abdominal pain EXAM: NUCLEAR MEDICINE HEPATOBILIARY IMAGING WITH GALLBLADDER EF TECHNIQUE: Sequential images of the abdomen were obtained out to 60 minutes following intravenous administration of radiopharmaceutical. After oral ingestion of Ensure, gallbladder ejection fraction was determined. At 60 min, normal ejection fraction is greater than 33%. RADIOPHARMACEUTICALS:  5.3 mCi Tc-28m  Choletec IV COMPARISON:  None Available. FINDINGS: Prompt uptake and biliary excretion of activity by the liver is seen. Gallbladder activity is visualized, consistent with patency of cystic duct. Biliary activity passes into small bowel, consistent with patent common bile duct. Calculated gallbladder ejection fraction is 52%. (Normal gallbladder ejection fraction with Ensure is greater than 33% and less than 80%.) IMPRESSION: No common duct or cystic duct obstruction. Normal gallbladder ejection fraction 52% Electronically Signed   By: Karen Kays M.D.   On: 04/09/2023 15:14   MR THORACIC SPINE WO CONTRAST Result Date: 04/09/2023 CLINICAL DATA:  Mid back pain EXAM: MRI THORACIC SPINE WITHOUT CONTRAST TECHNIQUE: Multiplanar, multisequence MR imaging of the thoracic spine was performed. No intravenous contrast was administered. COMPARISON:  None Available. FINDINGS:  Alignment:  Physiologic. Vertebrae: No fracture, evidence of discitis, or bone lesion. Cord:  Normal signal and morphology. Paraspinal and other soft tissues: Negative. Disc levels: Mild midthoracic degenerative disc disease, greatest at T6-7 where there is a small disc bulge. No spinal canal or neural foraminal stenosis within the thoracic spine. IMPRESSION: Mild midthoracic degenerative disc disease without spinal canal or neural foraminal stenosis. Electronically Signed   By: Deatra Robinson M.D.   On: 04/09/2023 01:14   US Abdomen Limited RUQ (LIVER/GB) Result Date: 04/08/2023 CLINICAL DATA:  Right upper quadrant pain. EXAM: ULTRASOUND ABDOMEN LIMITED RIGHT UPPER QUADRANT COMPARISON:  March 31, 2023 FINDINGS: Gallbladder: A moderate amount of echogenic sludge is seen within the gallbladder lumen. No gallstones or wall thickening visualized (2.3 mm). No sonographic Murphy sign noted by sonographer. Common bile duct: Diameter: 3.0 mm Liver: No focal lesion identified. Diffusely increased echogenicity of the liver parenchyma is noted. Portal vein is patent on color Doppler imaging with normal direction of blood flow towards the liver. Other: None. IMPRESSION: 1. Gallbladder sludge without evidence of cholelithiasis or acute cholecystitis. 2. Hepatic steatosis. Electronically Signed   By: Aram Candela M.D.   On: 04/08/2023 02:07   DG Abd 1 View Result Date: 04/07/2023 CLINICAL DATA:  Nausea and vomiting. EXAM: ABDOMEN - 1 VIEW COMPARISON:  CT yesterday FINDINGS: There is enteric contrast throughout the colon. No small bowel distension. There is mild gaseous gastric distension. Left renal stone on CT is not well seen by radiograph. Unchanged pelvic phleboliths. IMPRESSION: 1. Mild gaseous gastric distension. Recommend correlation for gastroenteritis. 2. Otherwise normal bowel gas pattern. 3. Left renal stone on CT is not seen on the current exam. Electronically Signed   By: Narda Rutherford M.D.   On:  04/07/2023 15:54   CT ABDOMEN PELVIS WO CONTRAST Result Date: 04/06/2023 CLINICAL DATA:  Acute abdominal pain. EXAM: CT ABDOMEN AND PELVIS WITHOUT CONTRAST TECHNIQUE: Multidetector CT imaging of the abdomen and pelvis was performed following the standard protocol without IV contrast. RADIATION DOSE REDUCTION: This exam was performed according to the departmental dose-optimization program which includes automated exposure control, adjustment of the mA and/or kV according to patient size and/or use of iterative reconstruction technique. COMPARISON:  Ultrasound 03/31/2023.  No prior CT available FINDINGS: Lower chest: Dependent atelectasis in the left lower lobe. No significant pleural effusion. Hepatobiliary: No focal liver abnormality on this unenhanced exam. Moderate gallbladder distention without calcified gallstone or pericholecystic inflammation. No biliary dilatation. Pancreas: No ductal dilatation or inflammation. Spleen: Upper normal in size, 12.2 cm cranial caudal. No focal abnormality. Adrenals/Urinary Tract: Unremarkable adrenal glands. Punctate nonobstructing stone in the mid left kidney. No hydronephrosis or renal inflammation. No evidence of focal renal abnormality on this unenhanced exam. Partially distended urinary  bladder, normal for degree of distension. Stomach/Bowel: The stomach is nondistended. There is no small bowel obstruction or inflammatory change. Enteric contrast is seen throughout the colon. Normal appendix. Slight increased air in the transverse colon without abnormal distension. Minimal formed stool in the colon. No colonic wall thickening or pericolonic edema. Vascular/Lymphatic: Normal caliber abdominal aorta. No portal venous or mesenteric gas. No abdominopelvic adenopathy. Reproductive: Uterus and bilateral adnexa are unremarkable. Other: No free air, free fluid, or intra-abdominal fluid collection. Diminutive fat containing umbilical hernia Musculoskeletal: There are no acute or  suspicious osseous abnormalities. IMPRESSION: 1. No acute abnormality or explanation for abdominal pain. 2. Punctate nonobstructing left renal stone. Electronically Signed   By: Narda Rutherford M.D.   On: 04/06/2023 16:37   DG Abd 1 View Result Date: 04/05/2023 CLINICAL DATA:  [id number] Abdominal pain [id number] EXAM: ABDOMEN - 1 VIEW COMPARISON:  None Available. FINDINGS: No disproportionately dilated small bowel loops. Mild colonic stool. No evidence of pneumatosis or pneumoperitoneum. Clear lung bases. No pathologic soft tissue calcifications. IMPRESSION: Nonobstructive bowel gas pattern. Mild colonic stool. Electronically Signed   By: Delbert Phenix M.D.   On: 04/05/2023 13:34   US ABDOMEN LIMITED RUQ (LIVER/GB) Result Date: 03/31/2023 CLINICAL DATA:  Epigastric pain. EXAM: ULTRASOUND ABDOMEN LIMITED RIGHT UPPER QUADRANT COMPARISON:  05/19/2022 FINDINGS: Gallbladder: No gallstones or gallbladder wall thickening. No pericholecystic fluid. The sonographer reports no sonographic Murphy's sign. Common bile duct: Diameter: 3 mm Liver: Diffuse coarsening of liver parenchyma with decreased through transmission suggests fatty deposition. No focal abnormality within the liver parenchyma. Portal vein is patent on color Doppler imaging with normal direction of blood flow towards the liver. Other: None. IMPRESSION: 1. No acute findings. 2. Increased echogenicity of liver parenchyma suggests steatosis. Electronically Signed   By: Kennith Center M.D.   On: 03/31/2023 05:53   DG Chest 1 View Result Date: 03/31/2023 CLINICAL DATA:  Chest pain EXAM: PORTABLE CHEST 1 VIEW COMPARISON:  11/07/2022 FINDINGS: The heart size and mediastinal contours are within normal limits. Both lungs are clear. The visualized skeletal structures are unremarkable. IMPRESSION: No active disease. Electronically Signed   By: Alcide Clever M.D.   On: 03/31/2023 02:42    Microbiology: Recent Results (from the past 240 hours)  Resp panel by RT-PCR (RSV,  Flu A&B, Covid) Anterior Nasal Swab     Status: None   Collection Time: 04/01/23 11:50 AM   Specimen: Anterior Nasal Swab  Result Value Ref Range Status   SARS Coronavirus 2 by RT PCR NEGATIVE NEGATIVE Final    Comment: (NOTE) SARS-CoV-2 target nucleic acids are NOT DETECTED.  The SARS-CoV-2 RNA is generally detectable in upper respiratory specimens during the acute phase of infection. The lowest concentration of SARS-CoV-2 viral copies this assay can detect is 138 copies/mL. A negative result does not preclude SARS-Cov-2 infection and should not be used as the sole basis for treatment or other patient management decisions. A negative result may occur with  improper specimen collection/handling, submission of specimen other than nasopharyngeal swab, presence of viral mutation(s) within the areas targeted by this assay, and inadequate number of viral copies(<138 copies/mL). A negative result must be combined with clinical observations, patient history, and epidemiological information. The expected result is Negative.  Fact Sheet for Patients:  BloggerCourse.com  Fact Sheet for Healthcare Providers:  SeriousBroker.it  This test is no t yet approved or cleared by the [place] FDA and  has been authorized for detection and/or diagnosis of SARS-CoV-2 by FDA under  an Emergency Use Authorization (EUA). This EUA will remain  in effect (meaning this test can be used) for the duration of the COVID-19 declaration under Section 564(b)(1) of the Act, 21 U.S.C.section 360bbb-3(b)(1), unless the authorization is terminated  or revoked sooner.       Influenza A by PCR NEGATIVE NEGATIVE Final   Influenza B by PCR NEGATIVE NEGATIVE Final    Comment: (NOTE) The Xpert Xpress SARS-CoV-2/FLU/RSV plus assay is intended as an aid in the diagnosis of influenza from Nasopharyngeal swab specimens and should not be used as a sole basis for  treatment. Nasal washings and aspirates are unacceptable for Xpert Xpress SARS-CoV-2/FLU/RSV testing.  Fact Sheet for Patients: BloggerCourse.com  Fact Sheet for Healthcare Providers: SeriousBroker.it  This test is not yet approved or cleared by the [place] FDA and has been authorized for detection and/or diagnosis of SARS-CoV-2 by FDA under an Emergency Use Authorization (EUA). This EUA will remain in effect (meaning this test can be used) for the duration of the COVID-19 declaration under Section 564(b)(1) of the Act, 21 U.S.C. section 360bbb-3(b)(1), unless the authorization is terminated or revoked.     Resp Syncytial Virus by PCR NEGATIVE NEGATIVE Final    Comment: (NOTE) Fact Sheet for Patients: BloggerCourse.com  Fact Sheet for Healthcare Providers: SeriousBroker.it  This test is not yet approved or cleared by the [place] FDA and has been authorized for detection and/or diagnosis of SARS-CoV-2 by FDA under an Emergency Use Authorization (EUA). This EUA will remain in effect (meaning this test can be used) for the duration of the COVID-19 declaration under Section 564(b)(1) of the Act, 21 U.S.C. section 360bbb-3(b)(1), unless the authorization is terminated or revoked.  Performed at [facility], [address] [place], [place] [address]   MRSA Next Gen by PCR, Nasal     Status: None   Collection Time: 04/02/23 12:53 PM   Specimen: Nasal Mucosa; Nasal Swab  Result Value Ref Range Status   MRSA by PCR Next Gen NOT DETECTED NOT DETECTED Final    Comment: (NOTE) The GeneXpert MRSA Assay (FDA approved for NASAL specimens only), is one component of a comprehensive MRSA colonization surveillance program. It is not intended to diagnose MRSA infection nor to guide or monitor treatment for MRSA infections. Test performance is not FDA approved in  patients less than 71 years old. Performed at [facility], [address] [place], [place] [address]      Labs: Basic Metabolic Panel: Recent Labs  Lab 04/03/23 0254 04/04/23 0506 04/05/23 0459 04/07/23 0855 04/08/23 0502 04/09/23 0417  NA 137 135  --  138 141 139  K 4.5 4.2  --  3.2* 3.5 3.6  CL 105 102  --  104 107 108  CO2 24 28  --  25 25 23   GLUCOSE 140* 99  --  112* 88 86  BUN 11 14  --  13 13 10   CREATININE 0.59 0.66  --  0.68 0.68 0.70  CALCIUM 9.1 8.9  --  9.2 8.9 8.6*  MG 2.4 2.3 2.6*  --   --   --   PHOS 3.6 4.8* 4.5  --   --   --    Liver Function Tests: Recent Labs  Lab 04/03/23 0254 04/04/23 0506 04/05/23 0459 04/07/23 1701 04/09/23 0417  AST 42* 24 24 22 15   ALT 24 24 20 28 18   ALKPHOS 46 39 40 42 32*  BILITOT 1.9* 2.7* 2.7* 2.2* 1.6*  PROT  6.6 6.1* 6.1* 6.2* 5.1*  ALBUMIN 3.9 3.5 3.8 3.9 2.9*   Recent Labs  Lab 04/07/23 0855  LIPASE 36  AMYLASE 48   No results for input(s): "AMMONIA" in the last 168 hours. CBC: Recent Labs  Lab 04/03/23 0254 04/03/23 1448 04/04/23 0506 04/05/23 0459 04/06/23 0453 04/07/23 0855 04/08/23 0502 04/09/23 0417  WBC 4.8  --  4.0 2.9* 3.8* 4.4 4.5 4.4  NEUTROABS 3.8  --  2.4 1.9 2.0 1.6*  --   --   HGB 8.2*   < > 8.0* 7.7* 8.8* 9.5* 7.9* 8.0*  HCT 22.8*   < > 22.2* 21.0* 24.6* 26.7* 22.2* 23.5*  MCV 101.3*  --  98.2 96.8 95.7 98.2 99.1 101.7*  PLT 114*  --  74* 56* 53* 67* 66* 75*   < > = values in this interval not displayed.   Cardiac Enzymes: No results for input(s): "CKTOTAL", "CKMB", "CKMBINDEX", "TROPONINI" in the last 168 hours. BNP: BNP (last 3 results) Recent Labs    05/10/22 1730  BNP 19.2    ProBNP (last 3 results) No results for input(s): "PROBNP" in the last 8760 hours.  CBG: Recent Labs  Lab 04/06/23 1146 04/06/23 1534 04/07/23 0801 04/08/23 0846 04/09/23 0601  GLUCAP 97 158* 80 71 105*       Signed:  Silvano Bilis MD.  [facility] 04/09/2023, 4:14  PM

## 2023-04-09 NOTE — Telephone Encounter (Signed)
 Expedited appeal faxed in on 04/09/23

## 2023-04-09 NOTE — Anesthesia Postprocedure Evaluation (Signed)
 Anesthesia Post Note  Patient: Presley Raddle  Procedure(s) Performed: EGD (ESOPHAGOGASTRODUODENOSCOPY)  Patient location during evaluation: Endoscopy Anesthesia Type: General Level of consciousness: awake and alert Pain management: pain level controlled Vital Signs Assessment: post-procedure vital signs reviewed and stable Respiratory status: spontaneous breathing, nonlabored ventilation, respiratory function stable and patient connected to nasal cannula oxygen Cardiovascular status: blood pressure returned to baseline and stable Postop Assessment: no apparent nausea or vomiting Anesthetic complications: no   No notable events documented.   Last Vitals:  Vitals:   04/08/23 2154 04/09/23 0435  BP: 102/62 (!) 113/54  Pulse: 72 72  Resp: 18 18  Temp: 36.9 C 36.6 C  SpO2: 100% 100%    Last Pain:  Vitals:   04/09/23 0435  TempSrc:   PainSc: 3                  Corinda Gubler

## 2023-04-09 NOTE — Plan of Care (Signed)

## 2023-04-09 NOTE — Progress Notes (Signed)
 [facility] Inpatient Progress Note    Subjective: Patient seen for follow up of abdominal pain, nausea, vomiting. Patient preparing to undergo HIDA scan this AM. Patient "somewhat better" with heating pad and supportive treatment. No pain medications given per patient. Still with mild nausea and "less" epigastric pain.  Objective: Vital signs in last 24 hours: Temp:  [96.8 F (36 C)-98.4 F (36.9 C)] 97.8 F (36.6 C) (03/13 0435) Pulse Rate:  [72-83] 72 (03/13 0435) Resp:  [16-18] 18 (03/13 0435) BP: (101-130)/(51-80) 113/54 (03/13 0435) SpO2:  [94 %-100 %] 100 % (03/13 0435) Weight:  [102.6 kg] (P) 102.6 kg (03/13 0554) Blood pressure (!) 113/54, pulse 72, temperature 97.8 F (36.6 C), resp. rate 18, height 5\' 3"  (1.6 m), weight (P) 102.6 kg, last menstrual period 11/21/2020, SpO2 100%.    Intake/Output from previous day: 03/12 0701 - 03/13 0700 In: 590.1 [I.V.:590.1] Out: -   Intake/Output this shift: No intake/output data recorded.   Gen: NAD. Appears comfortable.  HEENT: [place]/AT. PERRLA. Normal external ear exam.  Chest: CTA, no wheezes.  CV: RR nl S1, S2. No gallops.  Abd: soft, nt, nd. BS+  Ext: no edema. Pulses 2+  Neuro: Alert and oriented. Judgement appears normal. Nonfocal.   Lab Results: Results for orders placed or performed during the hospital encounter of 04/01/23 (from the past 24 hours)  Basic metabolic panel     Status: Abnormal   Collection Time: 04/09/23  4:17 AM  Result Value Ref Range   Sodium 139 135 - 145 mmol/L   Potassium 3.6 3.5 - 5.1 mmol/L   Chloride 108 98 - 111 mmol/L   CO2 23 22 - 32 mmol/L   Glucose, Bld 86 70 - 99 mg/dL   BUN 10 6 - 20 mg/dL   Creatinine, Ser 1.61 0.44 - 1.00 mg/dL   Calcium 8.6 (L) 8.9 - 10.3 mg/dL   GFR, Estimated >09 >60 mL/min   Anion gap 8 5 - 15  CBC     Status: Abnormal   Collection Time: 04/09/23  4:17 AM  Result Value Ref Range   WBC 4.4 4.0 - 10.5 K/uL   RBC 2.31 (L) 3.87 -  5.11 MIL/uL   Hemoglobin 8.0 (L) 12.0 - 15.0 g/dL   HCT 45.4 (L) 09.8 - 11.9 %   MCV 101.7 (H) 80.0 - 100.0 fL   MCH 34.6 (H) 26.0 - 34.0 pg   MCHC 34.0 30.0 - 36.0 g/dL   RDW 14.7 (H) 82.9 - 56.2 %   Platelets 75 (L) 150 - 400 K/uL   nRBC 1.6 (H) 0.0 - 0.2 %  Hepatic function panel     Status: Abnormal   Collection Time: 04/09/23  4:17 AM  Result Value Ref Range   Total Protein 5.1 (L) 6.5 - 8.1 g/dL   Albumin 2.9 (L) 3.5 - 5.0 g/dL   AST 15 15 - 41 U/L   ALT 18 0 - 44 U/L   Alkaline Phosphatase 32 (L) 38 - 126 U/L   Total Bilirubin 1.6 (H) 0.0 - 1.2 mg/dL   Bilirubin, Direct 0.2 0.0 - 0.2 mg/dL   Indirect Bilirubin 1.4 (H) 0.3 - 0.9 mg/dL  Glucose, capillary     Status: Abnormal   Collection Time: 04/09/23  6:01 AM  Result Value Ref Range   Glucose-Capillary 105 (H) 70 - 99 mg/dL     Recent Labs    13/08/65 0855 04/08/23 0502 04/09/23 0417  WBC 4.4 4.5 4.4  HGB 9.5*  7.9* 8.0*  HCT 26.7* 22.2* 23.5*  PLT 67* 66* 75*   BMET Recent Labs    04/07/23 0855 04/08/23 0502 04/09/23 0417  NA 138 141 139  K 3.2* 3.5 3.6  CL 104 107 108  CO2 25 25 23   GLUCOSE 112* 88 86  BUN 13 13 10   CREATININE 0.68 0.68 0.70  CALCIUM 9.2 8.9 8.6*   LFT Recent Labs    04/09/23 0417  PROT 5.1*  ALBUMIN 2.9*  AST 15  ALT 18  ALKPHOS 32*  BILITOT 1.6*  BILIDIR 0.2  IBILI 1.4*   PT/INR No results for input(s): "LABPROT", "INR" in the last 72 hours. Hepatitis Panel No results for input(s): "HEPBSAG", "HCVAB", "HEPAIGM", "HEPBIGM" in the last 72 hours. C-Diff No results for input(s): "CDIFFTOX" in the last 72 hours. No results for input(s): "CDIFFPCR" in the last 72 hours.   Studies/Results: MR THORACIC SPINE WO CONTRAST Result Date: 04/09/2023 CLINICAL DATA:  Mid back pain EXAM: MRI THORACIC SPINE WITHOUT CONTRAST TECHNIQUE: Multiplanar, multisequence MR imaging of the thoracic spine was performed. No intravenous contrast was administered. COMPARISON:  None Available.  FINDINGS: Alignment:  Physiologic. Vertebrae: No fracture, evidence of discitis, or bone lesion. Cord:  Normal signal and morphology. Paraspinal and other soft tissues: Negative. Disc levels: Mild midthoracic degenerative disc disease, greatest at T6-7 where there is a small disc bulge. No spinal canal or neural foraminal stenosis within the thoracic spine. IMPRESSION: Mild midthoracic degenerative disc disease without spinal canal or neural foraminal stenosis. Electronically Signed   By: Deatra Robinson M.D.   On: 04/09/2023 01:14   US Abdomen Limited RUQ (LIVER/GB) Result Date: 04/08/2023 CLINICAL DATA:  Right upper quadrant pain. EXAM: ULTRASOUND ABDOMEN LIMITED RIGHT UPPER QUADRANT COMPARISON:  March 31, 2023 FINDINGS: Gallbladder: A moderate amount of echogenic sludge is seen within the gallbladder lumen. No gallstones or wall thickening visualized (2.3 mm). No sonographic Murphy sign noted by sonographer. Common bile duct: Diameter: 3.0 mm Liver: No focal lesion identified. Diffusely increased echogenicity of the liver parenchyma is noted. Portal vein is patent on color Doppler imaging with normal direction of blood flow towards the liver. Other: None. IMPRESSION: 1. Gallbladder sludge without evidence of cholelithiasis or acute cholecystitis. 2. Hepatic steatosis. Electronically Signed   By: Aram Candela M.D.   On: 04/08/2023 02:07   DG Abd 1 View Result Date: 04/07/2023 CLINICAL DATA:  Nausea and vomiting. EXAM: ABDOMEN - 1 VIEW COMPARISON:  CT yesterday FINDINGS: There is enteric contrast throughout the colon. No small bowel distension. There is mild gaseous gastric distension. Left renal stone on CT is not well seen by radiograph. Unchanged pelvic phleboliths. IMPRESSION: 1. Mild gaseous gastric distension. Recommend correlation for gastroenteritis. 2. Otherwise normal bowel gas pattern. 3. Left renal stone on CT is not seen on the current exam. Electronically Signed   By: Narda Rutherford M.D.    On: 04/07/2023 15:54    Scheduled Inpatient Medications:    feeding supplement  237 mL Oral TID BM   folic acid  1 mg Oral Daily   multivitamin with minerals  1 tablet Oral Daily   pantoprazole (PROTONIX) IV  40 mg Intravenous Q12H   predniSONE  60 mg Oral Q breakfast    Continuous Inpatient Infusions:    sodium chloride 125 mL/hr at 04/09/23 0444    PRN Inpatient Medications:  alum & mag hydroxide-simeth, iohexol, morphine injection, ondansetron (ZOFRAN) IV, senna-docusate, traMADol  Miscellaneous: N/A  Assessment:  Abdominal pain - s/p EGD  w/ Gastritis noted. Biopsies pending. HIDA scan pending. 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 Thrombocytopenia.  Plan:  Continue acid suppression. Await gastric biopsy results. Await HIDA scan results. Following.Danielle [name]. Danielle [name], M.D. 04/09/2023, 10:52 AM

## 2023-04-10 ENCOUNTER — Telehealth: Payer: Self-pay | Admitting: Internal Medicine

## 2023-04-10 ENCOUNTER — Other Ambulatory Visit: Payer: Self-pay | Admitting: *Deleted

## 2023-04-10 DIAGNOSIS — E611 Iron deficiency: Secondary | ICD-10-CM

## 2023-04-10 DIAGNOSIS — D539 Nutritional anemia, unspecified: Secondary | ICD-10-CM

## 2023-04-10 DIAGNOSIS — D649 Anemia, unspecified: Secondary | ICD-10-CM

## 2023-04-10 NOTE — Telephone Encounter (Signed)
 Next Thursday 3/19-MD-labs CBC; BMP; hold tube; LDH-; D-2 possible 1 unit of blood.   Thanks GB  Alysson- FYI-

## 2023-04-14 ENCOUNTER — Telehealth: Payer: Self-pay | Admitting: Pharmacy Technician

## 2023-04-14 LAB — MISC LABCORP TEST (SEND OUT): Labcorp test code: 3065

## 2023-04-14 NOTE — Telephone Encounter (Signed)
 Oral Oncology Patient [facility] Encounter  Called to check on status of appeal submitted 04/09/2023. Per representative, appeal is still under review but they should have an answer for [place] on 04/16/2023.  [employer] phone number: [phone] Call reference #: [id number]  I will continue to check the status until final determination.   Patty Almedia Balls, CPhT Oncology Pharmacy Patient Advocate [facility] Direct Number: [phone] Fax: [phone]

## 2023-04-15 ENCOUNTER — Other Ambulatory Visit (HOSPITAL_COMMUNITY): Payer: Self-pay

## 2023-04-16 ENCOUNTER — Encounter: Payer: Self-pay | Admitting: Internal Medicine

## 2023-04-16 ENCOUNTER — Telehealth: Payer: Self-pay | Admitting: Pharmacy Technician

## 2023-04-16 ENCOUNTER — Inpatient Hospital Stay (HOSPITAL_BASED_OUTPATIENT_CLINIC_OR_DEPARTMENT_OTHER): Admitting: Internal Medicine

## 2023-04-16 ENCOUNTER — Inpatient Hospital Stay

## 2023-04-16 ENCOUNTER — Other Ambulatory Visit (HOSPITAL_COMMUNITY): Payer: Self-pay

## 2023-04-16 ENCOUNTER — Telehealth: Payer: Self-pay | Admitting: *Deleted

## 2023-04-16 VITALS — BP 104/62 | HR 80 | Temp 97.3°F | Resp 16 | Ht 63.0 in | Wt 220.0 lb

## 2023-04-16 DIAGNOSIS — D472 Monoclonal gammopathy: Secondary | ICD-10-CM | POA: Diagnosis not present

## 2023-04-16 DIAGNOSIS — E611 Iron deficiency: Secondary | ICD-10-CM

## 2023-04-16 DIAGNOSIS — K2900 Acute gastritis without bleeding: Secondary | ICD-10-CM

## 2023-04-16 DIAGNOSIS — D649 Anemia, unspecified: Secondary | ICD-10-CM | POA: Diagnosis not present

## 2023-04-16 DIAGNOSIS — D539 Nutritional anemia, unspecified: Secondary | ICD-10-CM

## 2023-04-16 DIAGNOSIS — R109 Unspecified abdominal pain: Secondary | ICD-10-CM

## 2023-04-16 LAB — CBC WITH DIFFERENTIAL (CANCER CENTER ONLY)
Abs Immature Granulocytes: 0.02 10*3/uL (ref 0.00–0.07)
Basophils Absolute: 0 10*3/uL (ref 0.0–0.1)
Basophils Relative: 0 %
Eosinophils Absolute: 0.1 10*3/uL (ref 0.0–0.5)
Eosinophils Relative: 2 %
HCT: 31.6 % — ABNORMAL LOW (ref 36.0–46.0)
Hemoglobin: 10.5 g/dL — ABNORMAL LOW (ref 12.0–15.0)
Immature Granulocytes: 1 %
Lymphocytes Relative: 28 %
Lymphs Abs: 1.1 10*3/uL (ref 0.7–4.0)
MCH: 35.8 pg — ABNORMAL HIGH (ref 26.0–34.0)
MCHC: 33.2 g/dL (ref 30.0–36.0)
MCV: 107.8 fL — ABNORMAL HIGH (ref 80.0–100.0)
Monocytes Absolute: 0.3 10*3/uL (ref 0.1–1.0)
Monocytes Relative: 8 %
Neutro Abs: 2.3 10*3/uL (ref 1.7–7.7)
Neutrophils Relative %: 61 %
Platelet Count: 210 10*3/uL (ref 150–400)
RBC: 2.93 MIL/uL — ABNORMAL LOW (ref 3.87–5.11)
RDW: 22.3 % — ABNORMAL HIGH (ref 11.5–15.5)
WBC Count: 3.9 10*3/uL — ABNORMAL LOW (ref 4.0–10.5)
nRBC: 0 % (ref 0.0–0.2)

## 2023-04-16 LAB — BASIC METABOLIC PANEL - CANCER CENTER ONLY
Anion gap: 5 (ref 5–15)
BUN: 8 mg/dL (ref 6–20)
CO2: 26 mmol/L (ref 22–32)
Calcium: 8.9 mg/dL (ref 8.9–10.3)
Chloride: 109 mmol/L (ref 98–111)
Creatinine: 0.53 mg/dL (ref 0.44–1.00)
GFR, Estimated: >60 mL/min (ref >60)
Glucose, Bld: 101 mg/dL — ABNORMAL HIGH (ref 70–99)
Potassium: 3.7 mmol/L (ref 3.5–5.1)
Sodium: 140 mmol/L (ref 135–145)

## 2023-04-16 LAB — SAMPLE TO BLOOD BANK

## 2023-04-16 LAB — LACTATE DEHYDROGENASE: LDH: 278 U/L — ABNORMAL HIGH (ref 98–192)

## 2023-04-16 MED ORDER — AZATHIOPRINE 100 MG PO TABS: 100.0000 mg | ORAL_TABLET | Freq: Every day | ORAL | 3 refills | Status: DC

## 2023-04-16 NOTE — Assessment & Plan Note (Addendum)
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 G-4 anaphylactic reaction to Rituximab.   # Patient currently on prednisone 60 mg a day; recommend prednisone 40 mg a day for the next 2 weeks; do not stop until further directions.Start Imuran 100 mg/day; DC cellcept. Continue folic acid.   # Discussed with patient given her allergic reactions to therapies including rituximab and IV Solu-Medrol-unfortunately options are limited except for ongoing prednisone. Awaiting [place] approval [not until April 2025].  # Discussed the role of splenectomy-in the treatment of autoimmune hemolytic anemia.  # abdominal pain-epigastric underwent endoscopy no obvious etiology noted.  Patient also had HIDA scan to rule out gallbladder pathology.  MRI of the back was done no acute process.However improved since admission. On PPI.  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  Stable.   # Hemorroidal bleeding/constipation- continue miralax.   # Vaccinations: Flu host [oct 2024].   # DISPOSITION: # referral to GI- [facility] re: abdominal pain/follow up from hospital # cancel Blood tomorrow #  follow up in 2 weeks [Wed/Thurs]- MD; labs- cbc/cnp; LDH; retic count; D-2 possible Blood-Dr.B

## 2023-04-16 NOTE — Patient Instructions (Addendum)
#   recommend prednisone 40 mg a day for the next 2 weeks; do not stop until further directions.  # Start Imuran 100 mg/day;   # DC cellcept.

## 2023-04-16 NOTE — Telephone Encounter (Addendum)
 Oral Oncology Patient Advocate Encounter  Called to check on status of expedited appeal. Per representative an "expedited appeal has been denied and you will receive a response to your appeal by May 09 2023." Representative also stated that the provider can file a formal Grievance for the expedited appeal denial if the provider does not agree with this decision.   I have asked the representative to fax over a copy of the denial for an expedited appeal since it includes the steps on how to file a formal Grievance for the expedited appeal denial.  Case #: [id number] Call reference #: [id number]  [facility] phone number: [phone]   Patty Almedia Balls, CPhT Oncology Pharmacy Patient Advocate [facility] Direct Number: [phone] Fax: [phone]

## 2023-04-16 NOTE — Progress Notes (Signed)
 [facility] CONSULT NOTE  Patient Care Team: Armando Gang, FNP as PCP - General (Family Medicine) Earna Coder, MD as Consulting Physician (Oncology)  CHIEF COMPLAINTS/PURPOSE OF CONSULTATION: ANEMIA  HEMATOLOGY HISTORY  # IRON DEFICIENCY ANEMIA CHRONIC [since 2019] AUG 2021- hb-9; MCV- 63; Iron sat- 3%; EGD > 7 years ago [[place]]; colonoscopy/capsule-NA; PO iron constipates.   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  JAK2 testing negative.  However bone marrow biopsy- [MAY 2024-negative for any myelophthisis process; suggestive of hemolysis] HOLD off metformin; and detromethomorphan- Bupropion. PNH testing-NEGATIVE; complement levels.;  Hemoglobin cascade; G6PD levels-WNL.  April 2024 abdominal ultrasound showed mild splenomegaly.    # 11/07/2022-RELAPSE-start  Rituximab X1 [OCT 11th, 2024- ]G-4-anaphylactic reaction needing EpiPen transporting to the emergency room.-Discontinue further rituximab. JAN 1st week, 2025- .500 mg BID; mycophenolate-  # MARCH 2025- OFF prednisone [ stopped/tapered OFF 10 mg a day] x2 months.  mycophenolate 500 mg twice daily-acute hemolytic anemia- complicated nstay in the hospital/ICU-anaphylactic reaction to "IV Solu-Medrol"-improved with EpiPen although she tolerated Solu-Medrol injection well earlier in the hospital stay.  Started on prednisone 60 to 80 mg a day- # Start Imuran 100 mg/day; # DC cellcept.   #History of heavy menstrual cycles  HISTORY OF PRESENTING ILLNESS: Alone. Ambulating.  Danielle [name] 48 y.o.  female with relapsed autoimmune hemolytic anemia steroid responsive -but DAT negative [ [suspect IgA] currently steroids is here for follow-up.  In the interim patient was admitted to hospital  for acute hemolytic anemia.  Patient had a complicated stay in the hospital/ICU-anaphylactic reaction to "IV Solu-Medrol", although she tolerated Solu-Medrol injection well earlier in the hospital stay.  Patient was continued on prednisone 60 to 80 mg a day.   Patient also noted to have worsening abdominal pain-epigastric underwent endoscopy no obvious etiology noted.  Patient also had HIDA scan to rule out gallbladder pathology.  MRI of the back was done no acute process. However improved since admission. On PPI.    Review of Systems  Constitutional:  Positive for malaise/fatigue. Negative for chills, diaphoresis, fever and weight loss.  HENT:  Negative for nosebleeds and sore throat.   Eyes:  Negative for double vision.  Respiratory:  Positive for shortness of breath. Negative for hemoptysis and sputum production.   Cardiovascular:  Positive for leg swelling. Negative for chest pain, palpitations and orthopnea.  Gastrointestinal:  Negative for abdominal pain, blood in stool, constipation, diarrhea, heartburn and melena.  Genitourinary:  Negative for dysuria, frequency and urgency.  Musculoskeletal:  Negative for back pain and joint pain.  Skin: Negative.  Negative for itching and rash.  Neurological:  Positive for tingling. Negative for focal weakness, weakness and headaches.  Endo/Heme/Allergies:  Does not bruise/bleed easily.  Psychiatric/Behavioral:  Negative for depression. The patient has insomnia. The patient is not nervous/anxious.     MEDICAL HISTORY:  Past Medical History:  Diagnosis Date   Anemia    Depression    Hepatic steatosis 04/01/2023    SURGICAL HISTORY: Past Surgical History:  Procedure Laterality Date   ABDOMINAL HYSTERECTOMY     COLONOSCOPY WITH PROPOFOL N/A 05/12/2022   Procedure: COLONOSCOPY WITH PROPOFOL;  Surgeon: Midge Minium, MD;  Location: [facility] ENDOSCOPY;  Service: Endoscopy;  Laterality: N/A;   ESOPHAGOGASTRODUODENOSCOPY N/A 04/08/2023   Procedure: EGD  (ESOPHAGOGASTRODUODENOSCOPY);  Surgeon: Summerfield,  Boykin Nearing, MD;  Location: [facility] ENDOSCOPY;  Service: Gastroenterology;  Laterality: N/A;   ESOPHAGOGASTRODUODENOSCOPY (EGD) WITH PROPOFOL N/A 05/12/2022   Procedure: ESOPHAGOGASTRODUODENOSCOPY (EGD) WITH PROPOFOL;  Surgeon: Midge Minium, MD;  Location: [facility] ENDOSCOPY;  Service: Endoscopy;  Laterality: N/A;   IR BONE MARROW BIOPSY & ASPIRATION  05/28/2022   TONSILLECTOMY      SOCIAL HISTORY: Social History   Socioeconomic History   Marital status: Single    Spouse name: Not on file   Number of children: Not on file   Years of education: Not on file   Highest education level: Not on file  Occupational History   Not on file  Tobacco Use   Smoking status: Former    Current packs/day: 0.00    Average packs/day: 0.5 packs/day for 25.0 years (12.5 ttl pk-yrs)    Types: Cigarettes    Start date: 05/09/1997    Quit date: 05/10/2022    Years since quitting: 0.9   Smokeless tobacco: Never  Vaping Use   Vaping status: Never Used  Substance and Sexual Activity   Alcohol use: Not Currently   Drug use: Never   Sexual activity: Not Currently  Other Topics Concern   Not on file  Social History Narrative   Lives in [place] with 4 kids; stay at home [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    Social Drivers of Corporate investment banker Strain: Not on file  Food Insecurity: No Food Insecurity (04/01/2023)   Hunger Vital Sign    Worried About Running Out of Food in the Last Year: Never true    Ran Out of Food in the Last Year: Never true  Transportation Needs: No Transportation Needs (04/01/2023)   PRAPARE - Administrator, Civil Service (Medical): No    Lack of Transportation (Non-Medical): No  Physical Activity: Not on file  Stress: Not on file  Social Connections: Unknown (04/01/2023)   Social Connection and Isolation Panel [NHANES]    Frequency of Communication with Friends and Family: More than three times a week     Frequency of Social Gatherings with Friends and Family: More than three times a week    Attends Religious Services: More than 4 times per year    Active Member of [employer] or Organizations: No    Attends Banker Meetings: Never    Marital Status: Patient declined  Catering manager Violence: Not At Risk (04/01/2023)   Humiliation, Afraid, Rape, and Kick questionnaire    Fear of Current or Ex-Partner: No    Emotionally Abused: No    Physically Abused: No    Sexually Abused: No    FAMILY HISTORY: Family History  Problem Relation Age of Onset   Breast cancer Paternal Aunt     ALLERGIES:  is allergic to rituxan [rituximab] and solu-medrol [methylprednisolone].  MEDICATIONS:  Current Outpatient Medications  Medication Sig Dispense Refill   albuterol (VENTOLIN HFA) 108 (90 Base) MCG/ACT inhaler Inhale 2 puffs into the lungs every 6 (six) hours as needed for wheezing or shortness of breath. 8 g 2   azathioprine (IMURAN) 100 MG tablet Take 1 tablet (100 mg total) by mouth daily. 30 tablet 3   famotidine (PEPCID) 20 MG tablet Take 1 tablet (20 mg total) by mouth 2 (two) times daily. 60 tablet 0   folic acid (FOLVITE) 1 MG tablet Take 1 tablet (1 mg total) by mouth daily. 90 tablet 1   pantoprazole (PROTONIX) 40 MG tablet Take 1 tablet (  40 mg total) by mouth daily. 30 tablet 1   predniSONE (DELTASONE) 10 MG tablet 6 tabs daily until hematologist follow-up 84 tablet 0   venlafaxine XR (EFFEXOR-XR) 37.5 MG 24 hr capsule Take 37.5 mg by mouth daily.     [Paused] metFORMIN (GLUCOPHAGE) 500 MG tablet Take 500 mg by mouth 2 (two) times daily with a meal. (Patient not taking: Reported on 04/01/2023)     [Paused] Semaglutide-Weight Management (WEGOVY) 0.25 MG/0.5ML SOAJ Inject 0.25 mg into the skin once a week. (Patient not taking: Reported on 04/16/2023)     No current facility-administered medications for this visit.      PHYSICAL EXAMINATION:   Vitals:   04/16/23 0837  BP: 104/62   Pulse: 80  Resp: 16  Temp: (!) 97.3 F (36.3 C)  SpO2: 100%        Filed Weights   04/16/23 0837  Weight: 220 lb (99.8 kg)        Physical Exam HENT:     Head: Normocephalic and atraumatic.     Mouth/Throat:     Pharynx: No oropharyngeal exudate.  Eyes:     Pupils: Pupils are equal, round, and reactive to light.  Cardiovascular:     Rate and Rhythm: Normal rate and regular rhythm.  Pulmonary:     Effort: Pulmonary effort is normal. No respiratory distress.     Breath sounds: Normal breath sounds. No wheezing.  Abdominal:     General: Bowel sounds are normal. There is no distension.     Palpations: Abdomen is soft. There is no mass.     Tenderness: There is no abdominal tenderness. There is no guarding or rebound.  Musculoskeletal:        General: No tenderness. Normal range of motion.     Cervical back: Normal range of motion and neck supple.  Skin:    General: Skin is warm.  Neurological:     Mental Status: She is alert and oriented to person, place, and time.  Psychiatric:        Mood and Affect: Affect normal.     LABORATORY DATA:  I have reviewed the data as listed Lab Results  Component Value Date   WBC 3.9 (L) 04/16/2023   HGB 10.5 (L) 04/16/2023   HCT 31.6 (L) 04/16/2023   MCV 107.8 (H) 04/16/2023   PLT 210 04/16/2023   Recent Labs    04/05/23 0459 04/07/23 0855 04/07/23 1701 04/08/23 0502 04/09/23 0417 04/16/23 0821  NA  --    < >  --  141 139 140  K  --    < >  --  3.5 3.6 3.7  CL  --    < >  --  107 108 109  CO2  --    < >  --  25 23 26   GLUCOSE  --    < >  --  88 86 101*  BUN  --    < >  --  13 10 8   CREATININE  --    < >  --  0.68 0.70 0.53  CALCIUM  --    < >  --  8.9 8.6* 8.9  GFRNONAA  --    < >  --  >60 >60 >60  PROT 6.1*  --  6.2*  --  5.1*  --   ALBUMIN 3.8  --  3.9  --  2.9*  --   AST 24  --  22  --  15  --  ALT 20  --  28  --  18  --   ALKPHOS 40  --  42  --  32*  --   BILITOT 2.7*  --  2.2*  --  1.6*  --    BILIDIR 0.4*  --  0.4*  --  0.2  --   IBILI 2.3*  --  1.8*  --  1.4*  --    < > = values in this interval not displayed.     NM Hepato W/EF Result Date: 04/09/2023 CLINICAL DATA:  Chronic abdominal pain EXAM: NUCLEAR MEDICINE HEPATOBILIARY IMAGING WITH GALLBLADDER EF TECHNIQUE: Sequential images of the abdomen were obtained out to 60 minutes following intravenous administration of radiopharmaceutical. After oral ingestion of Ensure, gallbladder ejection fraction was determined. At 60 min, normal ejection fraction is greater than 33%. RADIOPHARMACEUTICALS:  5.3 mCi Tc-37m  Choletec IV COMPARISON:  None Available. FINDINGS: Prompt uptake and biliary excretion of activity by the liver is seen. Gallbladder activity is visualized, consistent with patency of cystic duct. Biliary activity passes into small bowel, consistent with patent common bile duct. Calculated gallbladder ejection fraction is 52%. (Normal gallbladder ejection fraction with Ensure is greater than 33% and less than 80%.) IMPRESSION: No common duct or cystic duct obstruction. Normal gallbladder ejection fraction 52% Electronically Signed   By: Karen Kays M.D.   On: 04/09/2023 15:14   MR THORACIC SPINE WO CONTRAST Result Date: 04/09/2023 CLINICAL DATA:  Mid back pain EXAM: MRI THORACIC SPINE WITHOUT CONTRAST TECHNIQUE: Multiplanar, multisequence MR imaging of the thoracic spine was performed. No intravenous contrast was administered. COMPARISON:  None Available. FINDINGS: Alignment:  Physiologic. Vertebrae: No fracture, evidence of discitis, or bone lesion. Cord:  Normal signal and morphology. Paraspinal and other soft tissues: Negative. Disc levels: Mild midthoracic degenerative disc disease, greatest at T6-7 where there is a small disc bulge. No spinal canal or neural foraminal stenosis within the thoracic spine. IMPRESSION: Mild midthoracic degenerative disc disease without spinal canal or neural foraminal stenosis. Electronically Signed    By: Deatra Robinson M.D.   On: 04/09/2023 01:14   US Abdomen Limited RUQ (LIVER/GB) Result Date: 04/08/2023 CLINICAL DATA:  Right upper quadrant pain. EXAM: ULTRASOUND ABDOMEN LIMITED RIGHT UPPER QUADRANT COMPARISON:  March 31, 2023 FINDINGS: Gallbladder: A moderate amount of echogenic sludge is seen within the gallbladder lumen. No gallstones or wall thickening visualized (2.3 mm). No sonographic Murphy sign noted by sonographer. Common bile duct: Diameter: 3.0 mm Liver: No focal lesion identified. Diffusely increased echogenicity of the liver parenchyma is noted. Portal vein is patent on color Doppler imaging with normal direction of blood flow towards the liver. Other: None. IMPRESSION: 1. Gallbladder sludge without evidence of cholelithiasis or acute cholecystitis. 2. Hepatic steatosis. Electronically Signed   By: Aram Candela M.D.   On: 04/08/2023 02:07   DG Abd 1 View Result Date: 04/07/2023 CLINICAL DATA:  Nausea and vomiting. EXAM: ABDOMEN - 1 VIEW COMPARISON:  CT yesterday FINDINGS: There is enteric contrast throughout the colon. No small bowel distension. There is mild gaseous gastric distension. Left renal stone on CT is not well seen by radiograph. Unchanged pelvic phleboliths. IMPRESSION: 1. Mild gaseous gastric distension. Recommend correlation for gastroenteritis. 2. Otherwise normal bowel gas pattern. 3. Left renal stone on CT is not seen on the current exam. Electronically Signed   By: Narda Rutherford M.D.   On: 04/07/2023 15:54   CT ABDOMEN PELVIS WO CONTRAST Result Date: 04/06/2023 CLINICAL DATA:  Acute abdominal pain. EXAM:  CT ABDOMEN AND PELVIS WITHOUT CONTRAST TECHNIQUE: Multidetector CT imaging of the abdomen and pelvis was performed following the standard protocol without IV contrast. RADIATION DOSE REDUCTION: This exam was performed according to the departmental dose-optimization program which includes automated exposure control, adjustment of the mA and/or kV according to  patient size and/or use of iterative reconstruction technique. COMPARISON:  Ultrasound 03/31/2023.  No prior CT available FINDINGS: Lower chest: Dependent atelectasis in the left lower lobe. No significant pleural effusion. Hepatobiliary: No focal liver abnormality on this unenhanced exam. Moderate gallbladder distention without calcified gallstone or pericholecystic inflammation. No biliary dilatation. Pancreas: No ductal dilatation or inflammation. Spleen: Upper normal in size, 12.2 cm cranial caudal. No focal abnormality. Adrenals/Urinary Tract: Unremarkable adrenal glands. Punctate nonobstructing stone in the mid left kidney. No hydronephrosis or renal inflammation. No evidence of focal renal abnormality on this unenhanced exam. Partially distended urinary bladder, normal for degree of distension. Stomach/Bowel: The stomach is nondistended. There is no small bowel obstruction or inflammatory change. Enteric contrast is seen throughout the colon. Normal appendix. Slight increased air in the transverse colon without abnormal distension. Minimal formed stool in the colon. No colonic wall thickening or pericolonic edema. Vascular/Lymphatic: Normal caliber abdominal aorta. No portal venous or mesenteric gas. No abdominopelvic adenopathy. Reproductive: Uterus and bilateral adnexa are unremarkable. Other: No free air, free fluid, or intra-abdominal fluid collection. Diminutive fat containing umbilical hernia Musculoskeletal: There are no acute or suspicious osseous abnormalities. IMPRESSION: 1. No acute abnormality or explanation for abdominal pain. 2. Punctate nonobstructing left renal stone. Electronically Signed   By: Narda Rutherford M.D.   On: 04/06/2023 16:37   DG Abd 1 View Result Date: 04/05/2023 CLINICAL DATA:  [id number] Abdominal pain [id number] EXAM: ABDOMEN - 1 VIEW COMPARISON:  None Available. FINDINGS: No disproportionately dilated small bowel loops. Mild colonic stool. No evidence of pneumatosis or  pneumoperitoneum. Clear lung bases. No pathologic soft tissue calcifications. IMPRESSION: Nonobstructive bowel gas pattern. Mild colonic stool. Electronically Signed   By: Delbert Phenix M.D.   On: 04/05/2023 13:34   US ABDOMEN LIMITED RUQ (LIVER/GB) Result Date: 03/31/2023 CLINICAL DATA:  Epigastric pain. EXAM: ULTRASOUND ABDOMEN LIMITED RIGHT UPPER QUADRANT COMPARISON:  05/19/2022 FINDINGS: Gallbladder: No gallstones or gallbladder wall thickening. No pericholecystic fluid. The sonographer reports no sonographic Murphy's sign. Common bile duct: Diameter: 3 mm Liver: Diffuse coarsening of liver parenchyma with decreased through transmission suggests fatty deposition. No focal abnormality within the liver parenchyma. Portal vein is patent on color Doppler imaging with normal direction of blood flow towards the liver. Other: None. IMPRESSION: 1. No acute findings. 2. Increased echogenicity of liver parenchyma suggests steatosis. Electronically Signed   By: Kennith Center M.D.   On: 03/31/2023 05:53   DG Chest 1 View Result Date: 03/31/2023 CLINICAL DATA:  Chest pain EXAM: PORTABLE CHEST 1 VIEW COMPARISON:  11/07/2022 FINDINGS: The heart size and mediastinal contours are within normal limits. Both lungs are clear. The visualized skeletal structures are unremarkable. IMPRESSION: No active disease. Electronically Signed   By: Alcide Clever M.D.   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 G-4 anaphylactic reaction to Rituximab.   # Patient currently on prednisone 60 mg a day; recommend prednisone 40 mg a day for the next 2 weeks; do not stop until further directions.Start Imuran 100 mg/day; DC cellcept. Continue folic acid.   #  Discussed with patient given her allergic reactions to therapies including rituximab and IV  Solu-Medrol-unfortunately options are limited except for ongoing prednisone. Awaiting Tavalisse approval [not until April 2025].   # Discussed the role of splenectomy-in the treatment of autoimmune hemolytic anemia.  # abdominal pain-epigastric underwent endoscopy no obvious etiology noted.  Patient also had HIDA scan to rule out gallbladder pathology.  MRI of the back was done no acute process.However improved since admission. On PPI.    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  Stable.   # Hemorroidal bleeding/constipation- continue miralax.   # Vaccinations: Flu host [oct 2024].   # DISPOSITION: # cancel Blood tomorrow #  follow up in 2 weeks [Wed/Thurs]- MD; labs- cbc/cnp; LDH; retic count; D-2 possible Blood-Dr.B   All questions were answered. The patient knows to call the clinic with any problems, questions or concerns.    Earna Coder, MD 04/16/2023 9:33 AM

## 2023-04-16 NOTE — Telephone Encounter (Signed)
 Oral Oncology Patient Advocate Encounter  Formal Grievance form has been faxed to [facility].  I will continue to check on status until final determination.  Patty Almedia Balls, CPhT Oncology Pharmacy Patient Advocate [facility] Direct Number: [phone] Fax: [phone]

## 2023-04-16 NOTE — Progress Notes (Signed)
 Fatigue/weakness:YES Dyspena:YES Light headedness:YES Blood in stool:NO  ED 04/01/23 anemia, MRI/U/S/NM Hepato/CT ABD-PEL.

## 2023-04-16 NOTE — Telephone Encounter (Signed)
 Oral Oncology Patient Advocate Encounter   Began application for assistance for Tavalisse through [facility].   Application has been submitted.   Freda Munro phone number [phone] Fax: [phone]   I will continue to check the status until final determination.   Patty Almedia Balls, CPhT Oncology Pharmacy Patient Advocate [facility] Direct Number: [phone] Fax: [phone]

## 2023-04-16 NOTE — Telephone Encounter (Signed)
 Referral placed in Epic and faxed to [facility] per MD request for abdominal pain and hospital follow up.

## 2023-04-17 ENCOUNTER — Inpatient Hospital Stay

## 2023-04-20 NOTE — Telephone Encounter (Signed)
 Oral Oncology Patient Advocate Encounter  PA appeal has been approved. Pt will fill at [facility].  Patty Almedia Balls, CPhT Oncology Pharmacy Patient Advocate [facility] Direct Number: [phone] Fax: [phone]

## 2023-04-21 ENCOUNTER — Encounter: Payer: Self-pay | Admitting: Internal Medicine

## 2023-04-21 ENCOUNTER — Other Ambulatory Visit: Payer: Self-pay | Admitting: Pharmacy Technician

## 2023-04-21 ENCOUNTER — Other Ambulatory Visit (HOSPITAL_COMMUNITY): Payer: Self-pay

## 2023-04-21 ENCOUNTER — Telehealth: Payer: Self-pay | Admitting: Pharmacist

## 2023-04-21 ENCOUNTER — Other Ambulatory Visit: Payer: Self-pay

## 2023-04-21 DIAGNOSIS — D591 Autoimmune hemolytic anemia, unspecified: Secondary | ICD-10-CM

## 2023-04-21 MED ORDER — TAVALISSE 100 MG PO TABS
100.0000 mg | ORAL_TABLET | Freq: Two times a day (BID) | ORAL | 0 refills | Status: DC
Start: 2023-04-21 — End: 2023-05-19
  Filled 2023-04-21: qty 60, 30d supply, fill #0

## 2023-04-21 NOTE — Telephone Encounter (Signed)
 Oral Oncology Patient Advocate Encounter  Prior Authorization Appeal for Danielle [name] has been approved.    Effective dates: 04/20/2023 through 04/15/2024  Patients co-pay is $4.    Ella Bodo, CPhT Oncology Pharmacy Patient Advocate [facility] Direct Number: [phone] Fax: [phone]

## 2023-04-21 NOTE — Telephone Encounter (Signed)
 Clinical Pharmacist Practitioner Encounter   Patient Education Received new prescription for Tavalisse (fostamatinib)  for the treatment of Thrombocytopenia (MD suspects autoimmune component), planned duration until disease progression or unacceptable drug toxicity.  Counseled patient on administration, dosing, side effects, monitoring, drug-food interactions, safe handling, storage, and disposal. Patient will take 1 tablet (100 mg total) by mouth 2 (two) times daily.   Side effects include but not limited to: diarrhea, nausea, hypertension Diarrhea: patient knows to use loperamide as needed and call the office if they are having four or more loose stool per day    Reviewed with patient importance of keeping a medication schedule and plan for any missed doses.  After discussion with patient no patient barriers to medication adherence identified.   Danielle [name] voiced understanding and appreciation. All questions answered. Medication handout provided.  Provided patient with Oral Chemotherapy Navigation Clinic phone number. Patient knows to call the office with questions or concerns. Oral Chemotherapy Navigation Clinic will continue to follow.  Remi Haggard, PharmD, BCOP, CPP Hematology/Oncology Clinical Pharmacist [facility] Oral Chemotherapy Navigation Clinic [phone]  04/21/2023 11:48 AM

## 2023-04-21 NOTE — Progress Notes (Signed)
 Patient education documented in EPIC note on 04/21/23.

## 2023-04-21 NOTE — Telephone Encounter (Signed)
 Insurance has approved the [place], copay $4

## 2023-04-21 NOTE — Progress Notes (Signed)
 Specialty Pharmacy Initial Fill Coordination Note  Danielle [name] is a 48 y.o. female contacted today regarding refills of specialty medication(s) Fostamatinib Disodium (Tavalisse) .  Patient requested Delivery  on 04/23/23  to verified address [address] [place] [address]   Medication will be filled on 04/22/2023.   Patient is aware of $4 copayment. Bill a/r.  Patty Almedia Balls, CPhT Oncology Pharmacy Patient Advocate [facility] Direct Number: [phone] Fax: [phone]

## 2023-04-22 ENCOUNTER — Other Ambulatory Visit (HOSPITAL_COMMUNITY): Payer: Self-pay

## 2023-04-22 ENCOUNTER — Other Ambulatory Visit: Payer: Self-pay

## 2023-04-30 ENCOUNTER — Telehealth: Payer: Self-pay | Admitting: Internal Medicine

## 2023-04-30 ENCOUNTER — Inpatient Hospital Stay: Attending: Internal Medicine

## 2023-04-30 ENCOUNTER — Inpatient Hospital Stay: Admitting: Internal Medicine

## 2023-04-30 NOTE — Telephone Encounter (Signed)
 Patient called to say her son is having an episode and she can not come today. She would like to reschedule to next week. She stated she didn't think she would need the blood since she didn't get it last time. Please advise how to reschedule.

## 2023-05-01 ENCOUNTER — Inpatient Hospital Stay

## 2023-05-05 ENCOUNTER — Inpatient Hospital Stay (HOSPITAL_BASED_OUTPATIENT_CLINIC_OR_DEPARTMENT_OTHER): Admitting: Internal Medicine

## 2023-05-05 ENCOUNTER — Encounter: Payer: Self-pay | Admitting: Internal Medicine

## 2023-05-05 ENCOUNTER — Inpatient Hospital Stay: Attending: Internal Medicine

## 2023-05-05 VITALS — BP 100/71 | HR 84 | Temp 97.6°F | Resp 20 | Ht 63.0 in | Wt 215.0 lb

## 2023-05-05 DIAGNOSIS — D591 Autoimmune hemolytic anemia, unspecified: Secondary | ICD-10-CM | POA: Insufficient documentation

## 2023-05-05 DIAGNOSIS — R109 Unspecified abdominal pain: Secondary | ICD-10-CM | POA: Insufficient documentation

## 2023-05-05 DIAGNOSIS — D472 Monoclonal gammopathy: Secondary | ICD-10-CM | POA: Diagnosis present

## 2023-05-05 DIAGNOSIS — Z7952 Long term (current) use of systemic steroids: Secondary | ICD-10-CM | POA: Insufficient documentation

## 2023-05-05 DIAGNOSIS — R7989 Other specified abnormal findings of blood chemistry: Secondary | ICD-10-CM | POA: Insufficient documentation

## 2023-05-05 DIAGNOSIS — D649 Anemia, unspecified: Secondary | ICD-10-CM

## 2023-05-05 LAB — CMP (CANCER CENTER ONLY)
ALT: 63 U/L — ABNORMAL HIGH (ref 0–44)
AST: 19 U/L (ref 15–41)
Albumin: 3.9 g/dL (ref 3.5–5.0)
Alkaline Phosphatase: 119 U/L (ref 38–126)
Anion gap: 9 (ref 5–15)
BUN: 12 mg/dL (ref 6–20)
CO2: 25 mmol/L (ref 22–32)
Calcium: 8.9 mg/dL (ref 8.9–10.3)
Chloride: 104 mmol/L (ref 98–111)
Creatinine: 0.7 mg/dL (ref 0.44–1.00)
GFR, Estimated: >60 mL/min (ref >60)
Glucose, Bld: 89 mg/dL (ref 70–99)
Potassium: 4.4 mmol/L (ref 3.5–5.1)
Sodium: 138 mmol/L (ref 135–145)
Total Bilirubin: 1.4 mg/dL — ABNORMAL HIGH (ref 0.0–1.2)
Total Protein: 7.4 g/dL (ref 6.5–8.1)

## 2023-05-05 LAB — CBC WITH DIFFERENTIAL (CANCER CENTER ONLY)
Abs Immature Granulocytes: 0.02 10*3/uL (ref 0.00–0.07)
Basophils Absolute: 0 10*3/uL (ref 0.0–0.1)
Basophils Relative: 0 %
Eosinophils Absolute: 0.1 10*3/uL (ref 0.0–0.5)
Eosinophils Relative: 2 %
HCT: 39 % (ref 36.0–46.0)
Hemoglobin: 13.4 g/dL (ref 12.0–15.0)
Immature Granulocytes: 0 %
Lymphocytes Relative: 23 %
Lymphs Abs: 1.3 10*3/uL (ref 0.7–4.0)
MCH: 34.7 pg — ABNORMAL HIGH (ref 26.0–34.0)
MCHC: 34.4 g/dL (ref 30.0–36.0)
MCV: 101 fL — ABNORMAL HIGH (ref 80.0–100.0)
Monocytes Absolute: 0.4 10*3/uL (ref 0.1–1.0)
Monocytes Relative: 7 %
Neutro Abs: 3.8 10*3/uL (ref 1.7–7.7)
Neutrophils Relative %: 68 %
Platelet Count: 251 10*3/uL (ref 150–400)
RBC: 3.86 MIL/uL — ABNORMAL LOW (ref 3.87–5.11)
RDW: 15.8 % — ABNORMAL HIGH (ref 11.5–15.5)
WBC Count: 5.7 10*3/uL (ref 4.0–10.5)
nRBC: 0 % (ref 0.0–0.2)

## 2023-05-05 LAB — RETIC PANEL
Immature Retic Fract: 6.9 % (ref 2.3–15.9)
RBC.: 3.86 MIL/uL — ABNORMAL LOW (ref 3.87–5.11)
Retic Count, Absolute: 92.6 10*3/uL (ref 19.0–186.0)
Retic Ct Pct: 2.4 % (ref 0.4–3.1)
Reticulocyte Hemoglobin: 37 pg (ref >27.9)

## 2023-05-05 LAB — LACTATE DEHYDROGENASE: LDH: 165 U/L (ref 98–192)

## 2023-05-05 NOTE — Progress Notes (Signed)
 Fatigue/weakness: NO Dyspena: NO  Light headedness: NO  Blood in stool: NO

## 2023-05-05 NOTE — Patient Instructions (Signed)
#   continue recommend prednisone 20 mg a day for the next 3 weeks; do not stop until further directions. Continue  Imuran 100 mg/day; Tavvalisse for now.

## 2023-05-05 NOTE — Assessment & Plan Note (Addendum)
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 G-4 anaphylactic reaction to Rituximab.   # Patient currently on prednisone 40 mg a day x2 weeks; recommend prednisone 20 mg a day for the next 3 weeks; do not stop until further directions. Continue  Imuran 100 mg/day; Tavvalisse Continue folic acid.   # abdominal pain-epigastric underwent endoscopy no obvious etiology noted.  Patient also had HIDA scan to rule out gallbladder pathology.  MRI of the back was done no acute process- Improved.   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  Stable.   # Vaccinations: Flu host [oct 2024].   # DISPOSITION: # cancel Blood tomorrow #  follow up in 3 weeks [Wed/Thurs]- MD; labs- cbc/cmp; LDH; retic count; haptoglobin; D-2 possible Blood-Dr.B

## 2023-05-05 NOTE — Progress Notes (Signed)
 [facility] CONSULT NOTE  Patient Care Team: Armando Gang, FNP as PCP - General (Family Medicine) Earna Coder, MD as Consulting Physician (Oncology)  CHIEF COMPLAINTS/PURPOSE OF CONSULTATION: ANEMIA  HEMATOLOGY HISTORY  # IRON DEFICIENCY ANEMIA CHRONIC [since 2019] AUG 2021- hb-9; MCV- 63; Iron sat- 3%; EGD > 7 years ago [[place]]; colonoscopy/capsule-NA; PO iron constipates.   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  JAK2 testing negative.  However bone marrow biopsy- [MAY 2024-negative for any myelophthisis process; suggestive of hemolysis] HOLD off metformin; and detromethomorphan- Bupropion. PNH testing-NEGATIVE; complement levels.;  Hemoglobin cascade; G6PD levels-WNL.  April 2024 abdominal ultrasound showed mild splenomegaly.    # 11/07/2022-RELAPSE-start  Rituximab X1 [OCT 11th, 2024- ]G-4-anaphylactic reaction needing EpiPen transporting to the emergency room.-Discontinue further rituximab. JAN 1st week, 2025- .500 mg BID; mycophenolate-DC:  Imuran+ tavasslise [mid March 2025]   # MARCH 2025- OFF prednisone [ stopped/tapered OFF 10 mg a day] x2 months.  mycophenolate 500 mg twice daily-acute hemolytic anemia- complicated nstay in the hospital/ICU-anaphylactic reaction to "IV Solu-Medrol"-improved with EpiPen although she tolerated Solu-Medrol injection well earlier in the hospital stay.  Started on prednisone 60 to 80 mg a day- # Start Imuran 100 mg/day; # DC cellcept.   #History of heavy menstrual cycles  HISTORY OF PRESENTING ILLNESS: Alone. Ambulating.  Danielle [name] 48 y.o.  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  Notes to have improvement of abodminal pain. Notes to have improvement of energy levels.   Review of Systems  Constitutional:  Negative for chills, diaphoresis, fever and weight loss.  HENT:  Negative for nosebleeds and sore throat.   Eyes:  Negative for double vision.  Respiratory:  Negative for hemoptysis and sputum production.   Cardiovascular:  Negative for chest pain, palpitations and orthopnea.  Gastrointestinal:  Negative for abdominal pain, blood in stool, constipation, diarrhea, heartburn and melena.  Genitourinary:  Negative for dysuria, frequency and urgency.  Musculoskeletal:  Negative for back pain and joint pain.  Skin: Negative.  Negative for itching and rash.  Neurological:  Negative for focal weakness, weakness and headaches.  Endo/Heme/Allergies:  Does not bruise/bleed easily.  Psychiatric/Behavioral:  Negative for depression. The patient is not nervous/anxious.     MEDICAL HISTORY:  Past Medical History:  Diagnosis Date   Anemia    Depression    Hepatic steatosis 04/01/2023    SURGICAL HISTORY: Past Surgical History:  Procedure Laterality Date   ABDOMINAL HYSTERECTOMY     COLONOSCOPY WITH PROPOFOL N/A 05/12/2022   Procedure: COLONOSCOPY WITH PROPOFOL;  Surgeon: Midge Minium, MD;  Location: [facility] ENDOSCOPY;  Service: Endoscopy;  Laterality: N/A;   ESOPHAGOGASTRODUODENOSCOPY N/A 04/08/2023   Procedure: EGD (ESOPHAGOGASTRODUODENOSCOPY);  Surgeon: Toledo, Boykin Nearing, MD;  Location: [facility] ENDOSCOPY;  Service: Gastroenterology;  Laterality: N/A;   ESOPHAGOGASTRODUODENOSCOPY (EGD) WITH PROPOFOL N/A 05/12/2022   Procedure: ESOPHAGOGASTRODUODENOSCOPY (EGD) WITH PROPOFOL;  Surgeon: Midge Minium, MD;  Location: [facility] ENDOSCOPY;  Service: Endoscopy;  Laterality: N/A;   IR BONE MARROW BIOPSY & ASPIRATION  05/28/2022   TONSILLECTOMY      SOCIAL HISTORY: Social History   Socioeconomic History   Marital status: Single    Spouse name: Not on  file  Number of children: Not on file   Years of education: Not on file   Highest education level: Not on file  Occupational History   Not on file  Tobacco Use   Smoking status: Former    Current packs/day: 0.00    Average packs/day: 0.5 packs/day for 25.0 years (12.5 ttl pk-yrs)    Types: Cigarettes    Start date: 05/09/1997    Quit date: 05/10/2022    Years since quitting: 0.9   Smokeless tobacco: Never  Vaping Use   Vaping status: Never Used  Substance and Sexual Activity   Alcohol use: Not Currently   Drug use: Never   Sexual activity: Not Currently  Other Topics Concern   Not on file  Social History Narrative   Lives in [place] with 4 kids; stay at home [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    Social Drivers of Corporate investment banker Strain: Not on file  Food Insecurity: No Food Insecurity (04/01/2023)   Hunger Vital Sign    Worried About Running Out of Food in the Last Year: Never true    Ran Out of Food in the Last Year: Never true  Transportation Needs: No Transportation Needs (04/01/2023)   PRAPARE - Administrator, Civil Service (Medical): No    Lack of Transportation (Non-Medical): No  Physical Activity: Not on file  Stress: Not on file  Social Connections: Unknown (04/01/2023)   Social Connection and Isolation Panel [NHANES]    Frequency of Communication with Friends and Family: More than three times a week    Frequency of Social Gatherings with Friends and Family: More than three times a week    Attends Religious Services: More than 4 times per year    Active Member of [employer] or Organizations: No    Attends Banker Meetings: Never    Marital Status: Patient declined  Catering manager Violence: Not At Risk (04/01/2023)   Humiliation, Afraid, Rape, and Kick questionnaire    Fear of Current or Ex-Partner: No    Emotionally Abused: No    Physically Abused: No    Sexually Abused: No    FAMILY HISTORY: Family History   Problem Relation Age of Onset   Breast cancer Paternal Aunt     ALLERGIES:  is allergic to rituxan [rituximab] and solu-medrol [methylprednisolone].  MEDICATIONS:  Current Outpatient Medications  Medication Sig Dispense Refill   albuterol (VENTOLIN HFA) 108 (90 Base) MCG/ACT inhaler Inhale 2 puffs into the lungs every 6 (six) hours as needed for wheezing or shortness of breath. 8 g 2   azathioprine (IMURAN) 100 MG tablet Take 1 tablet (100 mg total) by mouth daily. 30 tablet 3   famotidine (PEPCID) 20 MG tablet Take 1 tablet (20 mg total) by mouth 2 (two) times daily. 60 tablet 0   folic acid (FOLVITE) 1 MG tablet Take 1 tablet (1 mg total) by mouth daily. 90 tablet 1   fostamatinib disodium (TAVALISSE) 100 MG tablet Take 1 tablet (100 mg total) by mouth 2 (two) times daily. 60 tablet 0   pantoprazole (PROTONIX) 40 MG tablet Take 1 tablet (40 mg total) by mouth daily. 30 tablet 1   predniSONE (DELTASONE) 10 MG tablet 6 tabs daily until hematologist follow-up (Patient taking differently: 6 tabs daily until hematologist follow-up(pt has been taking 40 mg daily as of 05/05/23-aj)) 84 tablet 0   [Paused] Semaglutide-Weight Management (WEGOVY) 0.25 MG/0.5ML SOAJ Inject 0.25 mg into the skin once a week.  venlafaxine XR (EFFEXOR-XR) 37.5 MG 24 hr capsule Take 37.5 mg by mouth daily.     [Paused] metFORMIN (GLUCOPHAGE) 500 MG tablet Take 500 mg by mouth 2 (two) times daily with a meal. (Patient not taking: Reported on 04/01/2023)     No current facility-administered medications for this visit.      PHYSICAL EXAMINATION:   Vitals:   05/05/23 1026  BP: 100/71  Pulse: 84  Resp: 20  Temp: 97.6 F (36.4 C)  SpO2: 99%        Filed Weights   05/05/23 1026  Weight: 215 lb (97.5 kg)        Physical Exam HENT:     Head: Normocephalic and atraumatic.     Mouth/Throat:     Pharynx: No oropharyngeal exudate.  Eyes:     Pupils: Pupils are equal, round, and reactive to light.   Cardiovascular:     Rate and Rhythm: Normal rate and regular rhythm.  Pulmonary:     Effort: Pulmonary effort is normal. No respiratory distress.     Breath sounds: Normal breath sounds. No wheezing.  Abdominal:     General: Bowel sounds are normal. There is no distension.     Palpations: Abdomen is soft. There is no mass.     Tenderness: There is no abdominal tenderness. There is no guarding or rebound.  Musculoskeletal:        General: No tenderness. Normal range of motion.     Cervical back: Normal range of motion and neck supple.  Skin:    General: Skin is warm.  Neurological:     Mental Status: She is alert and oriented to person, place, and time.  Psychiatric:        Mood and Affect: Affect normal.     LABORATORY DATA:  I have reviewed the data as listed Lab Results  Component Value Date   WBC 5.7 05/05/2023   HGB 13.4 05/05/2023   HCT 39.0 05/05/2023   MCV 101.0 (H) 05/05/2023   PLT 251 05/05/2023   Recent Labs    04/05/23 0459 04/07/23 0855 04/07/23 1701 04/08/23 0502 04/09/23 0417 04/16/23 0821 05/05/23 1022  NA  --    < >  --    < > 139 140 138  K  --    < >  --    < > 3.6 3.7 4.4  CL  --    < >  --    < > 108 109 104  CO2  --    < >  --    < > 23 26 25   GLUCOSE  --    < >  --    < > 86 101* 89  BUN  --    < >  --    < > 10 8 12   CREATININE  --    < >  --    < > 0.70 0.53 0.70  CALCIUM  --    < >  --    < > 8.6* 8.9 8.9  GFRNONAA  --    < >  --    < > >60 >60 >60  PROT 6.1*  --  6.2*  --  5.1*  --  7.4  ALBUMIN 3.8  --  3.9  --  2.9*  --  3.9  AST 24  --  22  --  15  --  19  ALT 20  --  28  --  18  --  63*  ALKPHOS 40  --  42  --  32*  --  119  BILITOT 2.7*  --  2.2*  --  1.6*  --  1.4*  BILIDIR 0.4*  --  0.4*  --  0.2  --   --   IBILI 2.3*  --  1.8*  --  1.4*  --   --    < > = values in this interval not displayed.     NM Hepato W/EF Result Date: 04/09/2023 CLINICAL DATA:  Chronic abdominal pain EXAM: NUCLEAR MEDICINE HEPATOBILIARY IMAGING  WITH GALLBLADDER EF TECHNIQUE: Sequential images of the abdomen were obtained out to 60 minutes following intravenous administration of radiopharmaceutical. After oral ingestion of Ensure, gallbladder ejection fraction was determined. At 60 min, normal ejection fraction is greater than 33%. RADIOPHARMACEUTICALS:  5.3 mCi Tc-74m  Choletec IV COMPARISON:  None Available. FINDINGS: Prompt uptake and biliary excretion of activity by the liver is seen. Gallbladder activity is visualized, consistent with patency of cystic duct. Biliary activity passes into small bowel, consistent with patent common bile duct. Calculated gallbladder ejection fraction is 52%. (Normal gallbladder ejection fraction with Ensure is greater than 33% and less than 80%.) IMPRESSION: No common duct or cystic duct obstruction. Normal gallbladder ejection fraction 52% Electronically Signed   By: Karen Kays M.D.   On: 04/09/2023 15:14   MR THORACIC SPINE WO CONTRAST Result Date: 04/09/2023 CLINICAL DATA:  Mid back pain EXAM: MRI THORACIC SPINE WITHOUT CONTRAST TECHNIQUE: Multiplanar, multisequence MR imaging of the thoracic spine was performed. No intravenous contrast was administered. COMPARISON:  None Available. FINDINGS: Alignment:  Physiologic. Vertebrae: No fracture, evidence of discitis, or bone lesion. Cord:  Normal signal and morphology. Paraspinal and other soft tissues: Negative. Disc levels: Mild midthoracic degenerative disc disease, greatest at T6-7 where there is a small disc bulge. No spinal canal or neural foraminal stenosis within the thoracic spine. IMPRESSION: Mild midthoracic degenerative disc disease without spinal canal or neural foraminal stenosis. Electronically Signed   By: Deatra Robinson M.D.   On: 04/09/2023 01:14   US Abdomen Limited RUQ (LIVER/GB) Result Date: 04/08/2023 CLINICAL DATA:  Right upper quadrant pain. EXAM: ULTRASOUND ABDOMEN LIMITED RIGHT UPPER QUADRANT COMPARISON:  March 31, 2023 FINDINGS: Gallbladder:  A moderate amount of echogenic sludge is seen within the gallbladder lumen. No gallstones or wall thickening visualized (2.3 mm). No sonographic Murphy sign noted by sonographer. Common bile duct: Diameter: 3.0 mm Liver: No focal lesion identified. Diffusely increased echogenicity of the liver parenchyma is noted. Portal vein is patent on color Doppler imaging with normal direction of blood flow towards the liver. Other: None. IMPRESSION: 1. Gallbladder sludge without evidence of cholelithiasis or acute cholecystitis. 2. Hepatic steatosis. Electronically Signed   By: Aram Candela M.D.   On: 04/08/2023 02:07   DG Abd 1 View Result Date: 04/07/2023 CLINICAL DATA:  Nausea and vomiting. EXAM: ABDOMEN - 1 VIEW COMPARISON:  CT yesterday FINDINGS: There is enteric contrast throughout the colon. No small bowel distension. There is mild gaseous gastric distension. Left renal stone on CT is not well seen by radiograph. Unchanged pelvic phleboliths. IMPRESSION: 1. Mild gaseous gastric distension. Recommend correlation for gastroenteritis. 2. Otherwise normal bowel gas pattern. 3. Left renal stone on CT is not seen on the current exam. Electronically Signed   By: Narda Rutherford M.D.   On: 04/07/2023 15:54   CT ABDOMEN PELVIS WO CONTRAST Result Date: 04/06/2023 CLINICAL DATA:  Acute abdominal pain. EXAM: CT ABDOMEN AND PELVIS  WITHOUT CONTRAST TECHNIQUE: Multidetector CT imaging of the abdomen and pelvis was performed following the standard protocol without IV contrast. RADIATION DOSE REDUCTION: This exam was performed according to the departmental dose-optimization program which includes automated exposure control, adjustment of the mA and/or kV according to patient size and/or use of iterative reconstruction technique. COMPARISON:  Ultrasound 03/31/2023.  No prior CT available FINDINGS: Lower chest: Dependent atelectasis in the left lower lobe. No significant pleural effusion. Hepatobiliary: No focal liver  abnormality on this unenhanced exam. Moderate gallbladder distention without calcified gallstone or pericholecystic inflammation. No biliary dilatation. Pancreas: No ductal dilatation or inflammation. Spleen: Upper normal in size, 12.2 cm cranial caudal. No focal abnormality. Adrenals/Urinary Tract: Unremarkable adrenal glands. Punctate nonobstructing stone in the mid left kidney. No hydronephrosis or renal inflammation. No evidence of focal renal abnormality on this unenhanced exam. Partially distended urinary bladder, normal for degree of distension. Stomach/Bowel: The stomach is nondistended. There is no small bowel obstruction or inflammatory change. Enteric contrast is seen throughout the colon. Normal appendix. Slight increased air in the transverse colon without abnormal distension. Minimal formed stool in the colon. No colonic wall thickening or pericolonic edema. Vascular/Lymphatic: Normal caliber abdominal aorta. No portal venous or mesenteric gas. No abdominopelvic adenopathy. Reproductive: Uterus and bilateral adnexa are unremarkable. Other: No free air, free fluid, or intra-abdominal fluid collection. Diminutive fat containing umbilical hernia Musculoskeletal: There are no acute or suspicious osseous abnormalities. IMPRESSION: 1. No acute abnormality or explanation for abdominal pain. 2. Punctate nonobstructing left renal stone. Electronically Signed   By: Narda Rutherford M.D.   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 G-4 anaphylactic reaction to Rituximab.   # Patient currently on prednisone 40 mg a day x2 weeks; recommend prednisone 20 mg a day for the next 3 weeks; do not stop until further directions. Continue  Imuran 100 mg/day; Tavvalisse Continue folic acid.   # abdominal pain-epigastric underwent  endoscopy no obvious etiology noted.  Patient also had HIDA scan to rule out gallbladder pathology.  MRI of the back was done no acute process- Improved.   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  Stable.   # Vaccinations: Flu host [oct 2024].   # DISPOSITION: # cancel Blood tomorrow #  follow up in 3 weeks [Wed/Thurs]- MD; labs- cbc/cmp; LDH; retic count; D-2 possible Blood-Dr.B    All questions were answered. The patient knows to call the clinic with any problems, questions or concerns.    Earna Coder, MD 05/05/2023 11:28 AM

## 2023-05-06 ENCOUNTER — Telehealth: Payer: Self-pay | Admitting: *Deleted

## 2023-05-06 NOTE — Telephone Encounter (Signed)
 Call to GI to f/u on referral today; Pt has referral appt scheduled with GI for 06/18/23 at 9:45 am.

## 2023-05-07 ENCOUNTER — Ambulatory Visit: Admitting: Internal Medicine

## 2023-05-07 ENCOUNTER — Other Ambulatory Visit

## 2023-05-08 ENCOUNTER — Ambulatory Visit

## 2023-05-13 ENCOUNTER — Other Ambulatory Visit: Payer: Self-pay

## 2023-05-14 ENCOUNTER — Other Ambulatory Visit: Payer: Self-pay

## 2023-05-19 ENCOUNTER — Other Ambulatory Visit: Payer: Self-pay | Admitting: Internal Medicine

## 2023-05-19 ENCOUNTER — Other Ambulatory Visit (HOSPITAL_COMMUNITY): Payer: Self-pay

## 2023-05-19 DIAGNOSIS — D591 Autoimmune hemolytic anemia, unspecified: Secondary | ICD-10-CM

## 2023-05-20 ENCOUNTER — Other Ambulatory Visit: Payer: Self-pay

## 2023-05-20 ENCOUNTER — Other Ambulatory Visit: Payer: Self-pay | Admitting: Internal Medicine

## 2023-05-20 DIAGNOSIS — D591 Autoimmune hemolytic anemia, unspecified: Secondary | ICD-10-CM

## 2023-05-20 NOTE — Progress Notes (Signed)
 Specialty Pharmacy Ongoing Clinical Assessment Note  Danielle [name] is a 48 y.o. female who is being followed by the specialty pharmacy service for RxSp Oncology   Patient's specialty medication(s) reviewed today: Fostamatinib  Disodium (Tavalisse )   Missed doses in the last 4 weeks: 0   Patient/Caregiver did not have any additional questions or concerns.   Therapeutic benefit summary: Patient is achieving benefit   Adverse events/side effects summary: No adverse events/side effects   Patient's therapy is appropriate to: Continue    Goals Addressed             This Visit's Progress    Laboratory improvement       Patient is on track. Patient will maintain adherence. Labs showed improvement on 05/05/23.         Follow up:  3 months  Donley Harland M Messiah Rovira Specialty Pharmacist

## 2023-05-20 NOTE — Progress Notes (Signed)
 Specialty Pharmacy Refill Coordination Note  Danielle [name] is a 48 y.o. female contacted today regarding refills of specialty medication(s) Fostamatinib  Disodium (Tavalisse )   Patient requested Delivery   Delivery date: 05/22/23   Verified address: [address] [place] [address]   Medication will be filled on 05/21/23.   This fill date is pending response to refill request from provider. Patient is aware and if they have not received fill by intended date they must follow up with pharmacy.

## 2023-05-21 ENCOUNTER — Other Ambulatory Visit: Payer: Self-pay | Admitting: Internal Medicine

## 2023-05-21 ENCOUNTER — Other Ambulatory Visit (HOSPITAL_COMMUNITY): Payer: Self-pay

## 2023-05-21 ENCOUNTER — Other Ambulatory Visit: Payer: Self-pay

## 2023-05-21 DIAGNOSIS — D591 Autoimmune hemolytic anemia, unspecified: Secondary | ICD-10-CM

## 2023-05-21 MED ORDER — TAVALISSE 100 MG PO TABS
100.0000 mg | ORAL_TABLET | Freq: Two times a day (BID) | ORAL | 0 refills | Status: DC
  Filled 2023-05-21: qty 60, 30d supply, fill #0

## 2023-05-21 NOTE — Progress Notes (Signed)
 Received refill from [facility] on 04/24. Medication is on order per Sandi. Left vm.

## 2023-05-22 ENCOUNTER — Other Ambulatory Visit (HOSPITAL_COMMUNITY): Payer: Self-pay

## 2023-05-22 ENCOUNTER — Other Ambulatory Visit: Payer: Self-pay

## 2023-05-22 NOTE — Progress Notes (Signed)
 Filled and ship Tavalisse  on 05/22/23.  Control number: [id number]

## 2023-05-27 ENCOUNTER — Inpatient Hospital Stay

## 2023-05-27 ENCOUNTER — Encounter: Payer: Self-pay | Admitting: Internal Medicine

## 2023-05-27 ENCOUNTER — Inpatient Hospital Stay (HOSPITAL_BASED_OUTPATIENT_CLINIC_OR_DEPARTMENT_OTHER): Admitting: Internal Medicine

## 2023-05-27 VITALS — BP 101/69 | HR 91 | Temp 98.7°F | Resp 24 | Ht 63.0 in | Wt 209.8 lb

## 2023-05-27 DIAGNOSIS — D649 Anemia, unspecified: Secondary | ICD-10-CM

## 2023-05-27 DIAGNOSIS — D472 Monoclonal gammopathy: Secondary | ICD-10-CM | POA: Diagnosis not present

## 2023-05-27 LAB — CMP (CANCER CENTER ONLY)
ALT: 251 U/L — ABNORMAL HIGH (ref 0–44)
AST: 457 U/L (ref 15–41)
Albumin: 3.9 g/dL (ref 3.5–5.0)
Alkaline Phosphatase: 103 U/L (ref 38–126)
Anion gap: 8 (ref 5–15)
BUN: 11 mg/dL (ref 6–20)
CO2: 25 mmol/L (ref 22–32)
Calcium: 9 mg/dL (ref 8.9–10.3)
Chloride: 106 mmol/L (ref 98–111)
Creatinine: 0.53 mg/dL (ref 0.44–1.00)
GFR, Estimated: >60 mL/min (ref >60)
Glucose, Bld: 108 mg/dL — ABNORMAL HIGH (ref 70–99)
Potassium: 4 mmol/L (ref 3.5–5.1)
Sodium: 139 mmol/L (ref 135–145)
Total Bilirubin: 1.7 mg/dL — ABNORMAL HIGH (ref 0.0–1.2)
Total Protein: 7.2 g/dL (ref 6.5–8.1)

## 2023-05-27 LAB — RETICULOCYTES
Immature Retic Fract: 9.5 % (ref 2.3–15.9)
RBC.: 3.45 MIL/uL — ABNORMAL LOW (ref 3.87–5.11)
Retic Count, Absolute: 62.8 10*3/uL (ref 19.0–186.0)
Retic Ct Pct: 1.8 % (ref 0.4–3.1)

## 2023-05-27 LAB — CBC WITH DIFFERENTIAL (CANCER CENTER ONLY)
Abs Immature Granulocytes: 0.01 10*3/uL (ref 0.00–0.07)
Basophils Absolute: 0 10*3/uL (ref 0.0–0.1)
Basophils Relative: 0 %
Eosinophils Absolute: 0 10*3/uL (ref 0.0–0.5)
Eosinophils Relative: 1 %
HCT: 34.1 % — ABNORMAL LOW (ref 36.0–46.0)
Hemoglobin: 12.1 g/dL (ref 12.0–15.0)
Immature Granulocytes: 0 %
Lymphocytes Relative: 28 %
Lymphs Abs: 0.8 10*3/uL (ref 0.7–4.0)
MCH: 35.3 pg — ABNORMAL HIGH (ref 26.0–34.0)
MCHC: 35.5 g/dL (ref 30.0–36.0)
MCV: 99.4 fL (ref 80.0–100.0)
Monocytes Absolute: 0.1 10*3/uL (ref 0.1–1.0)
Monocytes Relative: 5 %
Neutro Abs: 1.8 10*3/uL (ref 1.7–7.7)
Neutrophils Relative %: 66 %
Platelet Count: 170 10*3/uL (ref 150–400)
RBC: 3.43 MIL/uL — ABNORMAL LOW (ref 3.87–5.11)
RDW: 16.6 % — ABNORMAL HIGH (ref 11.5–15.5)
WBC Count: 2.8 10*3/uL — ABNORMAL LOW (ref 4.0–10.5)
nRBC: 0 % (ref 0.0–0.2)

## 2023-05-27 LAB — LACTATE DEHYDROGENASE: LDH: 575 U/L — ABNORMAL HIGH (ref 98–192)

## 2023-05-27 LAB — SAMPLE TO BLOOD BANK

## 2023-05-27 NOTE — Progress Notes (Signed)
 [facility] CONSULT NOTE  Patient Care Team: Sharyne Degree, FNP as PCP - General (Family Medicine) Gwyn Leos, MD as Consulting Physician (Oncology)  CHIEF COMPLAINTS/PURPOSE OF CONSULTATION: ANEMIA  HEMATOLOGY HISTORY  # IRON  DEFICIENCY ANEMIA CHRONIC [since 2019] AUG 2021- hb-9; MCV- 63; Iron  sat- 3%; EGD > 7 years ago [[place]]; colonoscopy/capsule-NA; PO iron  constipates.   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  JAK2 testing negative.  However bone marrow biopsy- [MAY 2024-negative for any myelophthisis process; suggestive of hemolysis] HOLD off metformin; and detromethomorphan- Bupropion. PNH testing-NEGATIVE; complement levels.;  Hemoglobin cascade; G6PD levels-WNL.  April 2024 abdominal ultrasound showed mild splenomegaly.    # 11/07/2022-RELAPSE-start  Rituximab  X1 [OCT 11th, 2024- ]G-4-anaphylactic reaction needing EpiPen  transporting to the emergency room.-Discontinue further rituximab . JAN 1st week, 2025- .500 mg BID; mycophenolate -DC:  Imuran + tavasslise [mid March 2025]   # MARCH 2025- OFF prednisone  [ stopped/tapered OFF 10 mg a day] x2 months.  mycophenolate  500 mg twice daily-acute hemolytic anemia- complicated nstay in the hospital/ICU-anaphylactic reaction to "IV Solu-Medrol "-improved with EpiPen  although she tolerated Solu-Medrol  injection well earlier in the hospital stay.  Started on prednisone  60 to 80 mg a day- # Start Imuran  100 mg/day; # DC cellcept .   #History of heavy menstrual cycles  HISTORY OF PRESENTING ILLNESS: Alone. Ambulating.  Danielle [name] 48 y.o.  female with relapsed autoimmune hemolytic anemia steroid responsive -but DAT negative [ [suspect IgA] currently oral steroids + Imuran + tavasslise [mid  March 2025] is here for follow-up.  Notes to have improvement of abodminal pain. Notes to have improvement of energy levels.   Review of Systems  Constitutional:  Negative for chills, diaphoresis, fever and weight loss.  HENT:  Negative for nosebleeds and sore throat.   Eyes:  Negative for double vision.  Respiratory:  Negative for hemoptysis and sputum production.   Cardiovascular:  Negative for chest pain, palpitations and orthopnea.  Gastrointestinal:  Negative for abdominal pain, blood in stool, constipation, diarrhea, heartburn and melena.  Genitourinary:  Negative for dysuria, frequency and urgency.  Musculoskeletal:  Negative for back pain and joint pain.  Skin: Negative.  Negative for itching and rash.  Neurological:  Negative for focal weakness, weakness and headaches.  Endo/Heme/Allergies:  Does not bruise/bleed easily.  Psychiatric/Behavioral:  Negative for depression. The patient is not nervous/anxious.     MEDICAL HISTORY:  Past Medical History:  Diagnosis Date   Anemia    Depression    Hepatic steatosis 04/01/2023    SURGICAL HISTORY: Past Surgical History:  Procedure Laterality Date   ABDOMINAL HYSTERECTOMY     COLONOSCOPY WITH PROPOFOL  N/A 05/12/2022   Procedure: COLONOSCOPY WITH PROPOFOL ;  Surgeon: Marnee Sink, MD;  Location: [facility] ENDOSCOPY;  Service: Endoscopy;  Laterality: N/A;   ESOPHAGOGASTRODUODENOSCOPY N/A 04/08/2023   Procedure: EGD (ESOPHAGOGASTRODUODENOSCOPY);  Surgeon: Toledo, Alphonsus Jeans, MD;  Location: [facility] ENDOSCOPY;  Service: Gastroenterology;  Laterality: N/A;   ESOPHAGOGASTRODUODENOSCOPY (EGD) WITH PROPOFOL  N/A 05/12/2022   Procedure: ESOPHAGOGASTRODUODENOSCOPY (EGD) WITH PROPOFOL ;  Surgeon: Marnee Sink, MD;  Location: [facility] ENDOSCOPY;  Service: Endoscopy;  Laterality: N/A;   IR BONE MARROW BIOPSY & ASPIRATION  05/28/2022   TONSILLECTOMY      SOCIAL HISTORY: Social History   Socioeconomic History   Marital status: Single    Spouse name: Not on  file  Number of children: Not on file   Years of education: Not on file   Highest education level: Not on file  Occupational History   Not on file  Tobacco Use   Smoking status: Former    Current packs/day: 0.00    Average packs/day: 0.5 packs/day for 25.0 years (12.5 ttl pk-yrs)    Types: Cigarettes    Start date: 05/09/1997    Quit date: 05/10/2022    Years since quitting: 1.0   Smokeless tobacco: Never  Vaping Use   Vaping status: Never Used  Substance and Sexual Activity   Alcohol use: Not Currently   Drug use: Never   Sexual activity: Not Currently  Other Topics Concern   Not on file  Social History Narrative   Lives in [place] with 4 kids; stay at home [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    Social Drivers of Corporate investment banker Strain: Not on file  Food Insecurity: No Food Insecurity (04/01/2023)   Hunger Vital Sign    Worried About Running Out of Food in the Last Year: Never true    Ran Out of Food in the Last Year: Never true  Transportation Needs: No Transportation Needs (04/01/2023)   PRAPARE - Administrator, Civil Service (Medical): No    Lack of Transportation (Non-Medical): No  Physical Activity: Not on file  Stress: Not on file  Social Connections: Unknown (04/01/2023)   Social Connection and Isolation Panel [NHANES]    Frequency of Communication with Friends and Family: More than three times a week    Frequency of Social Gatherings with Friends and Family: More than three times a week    Attends Religious Services: More than 4 times per year    Active Member of [employer] or Organizations: No    Attends Banker Meetings: Never    Marital Status: Patient declined  Catering manager Violence: Not At Risk (04/01/2023)   Humiliation, Afraid, Rape, and Kick questionnaire    Fear of Current or Ex-Partner: No    Emotionally Abused: No    Physically Abused: No    Sexually Abused: No    FAMILY HISTORY: Family History   Problem Relation Age of Onset   Breast cancer Paternal Aunt     ALLERGIES:  is allergic to rituxan  [rituximab ] and solu-medrol  [methylprednisolone ].  MEDICATIONS:  Current Outpatient Medications  Medication Sig Dispense Refill   albuterol  (VENTOLIN  HFA) 108 (90 Base) MCG/ACT inhaler Inhale 2 puffs into the lungs every 6 (six) hours as needed for wheezing or shortness of breath. 8 g 2   azathioprine  (IMURAN ) 100 MG tablet Take 1 tablet (100 mg total) by mouth daily. 30 tablet 3   famotidine  (PEPCID ) 20 MG tablet Take 1 tablet (20 mg total) by mouth 2 (two) times daily. 60 tablet 0   folic acid  (FOLVITE ) 1 MG tablet Take 1 tablet (1 mg total) by mouth daily. 90 tablet 1   fostamatinib  disodium (TAVALISSE ) 100 MG tablet Take 1 tablet (100 mg total) by mouth 2 (two) times daily. 60 tablet 0   pantoprazole  (PROTONIX ) 40 MG tablet Take 1 tablet (40 mg total) by mouth daily. 30 tablet 1   predniSONE  (DELTASONE ) 10 MG tablet 6 tabs daily until hematologist follow-up (Patient taking differently: 6 tabs daily until hematologist follow-up(pt has been taking 40 mg daily as of 05/05/23-aj)) 84 tablet 0   [Paused] Semaglutide-Weight Management (WEGOVY) 0.25 MG/0.5ML SOAJ Inject 0.25 mg into the skin once a week.  venlafaxine XR (EFFEXOR-XR) 37.5 MG 24 hr capsule Take 37.5 mg by mouth daily.     [Paused] metFORMIN (GLUCOPHAGE) 500 MG tablet Take 500 mg by mouth 2 (two) times daily with a meal. (Patient not taking: Reported on 04/01/2023)     No current facility-administered medications for this visit.      PHYSICAL EXAMINATION:   Vitals:   05/27/23 1002  BP: 101/69  Pulse: 91  Resp: (!) 24  Temp: 98.7 F (37.1 C)  SpO2: 99%         Filed Weights   05/27/23 1002  Weight: 209 lb 12.8 oz (95.2 kg)         Physical Exam HENT:     Head: Normocephalic and atraumatic.     Mouth/Throat:     Pharynx: No oropharyngeal exudate.  Eyes:     Pupils: Pupils are equal, round, and  reactive to light.  Cardiovascular:     Rate and Rhythm: Normal rate and regular rhythm.  Pulmonary:     Effort: Pulmonary effort is normal. No respiratory distress.     Breath sounds: Normal breath sounds. No wheezing.  Abdominal:     General: Bowel sounds are normal. There is no distension.     Palpations: Abdomen is soft. There is no mass.     Tenderness: There is no abdominal tenderness. There is no guarding or rebound.  Musculoskeletal:        General: No tenderness. Normal range of motion.     Cervical back: Normal range of motion and neck supple.  Skin:    General: Skin is warm.  Neurological:     Mental Status: She is alert and oriented to person, place, and time.  Psychiatric:        Mood and Affect: Affect normal.     LABORATORY DATA:  I have reviewed the data as listed Lab Results  Component Value Date   WBC 2.8 (L) 05/27/2023   HGB 12.1 05/27/2023   HCT 34.1 (L) 05/27/2023   MCV 99.4 05/27/2023   PLT 170 05/27/2023   Recent Labs    04/05/23 0459 04/07/23 0855 04/07/23 1701 04/08/23 0502 04/09/23 0417 04/16/23 0821 05/05/23 1022 05/27/23 1011  NA  --    < >  --    < > 139 140 138 139  K  --    < >  --    < > 3.6 3.7 4.4 4.0  CL  --    < >  --    < > 108 109 104 106  CO2  --    < >  --    < > 23 26 25 25   GLUCOSE  --    < >  --    < > 86 101* 89 108*  BUN  --    < >  --    < > 10 8 12 11   CREATININE  --    < >  --    < > 0.70 0.53 0.70 0.53  CALCIUM  --    < >  --    < > 8.6* 8.9 8.9 9.0  GFRNONAA  --    < >  --    < > >60 >60 >60 >60  PROT 6.1*  --  6.2*  --  5.1*  --  7.4 7.2  ALBUMIN 3.8  --  3.9  --  2.9*  --  3.9 3.9  AST 24  --  22  --  15  --  19 457*  ALT 20  --  28  --  18  --  63* 251*  ALKPHOS 40  --  42  --  32*  --  119 103  BILITOT 2.7*  --  2.2*  --  1.6*  --  1.4* 1.7*  BILIDIR 0.4*  --  0.4*  --  0.2  --   --   --   IBILI 2.3*  --  1.8*  --  1.4*  --   --   --    < > = values in this interval not displayed.     No results  found.   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 G-4 anaphylactic reaction to Rituximab .   # Patient to continue prednisone  20 mg a day [ not stop until further directions.] Continue  Imuran  100 mg/day; HOLD Tavvalisse 100 mg a day [see below re: elevated LFTs. ]Continue folic acid .   # Elevated LFTs: x10 upper limit- sec to Tavallise- HOLD for now- check labs weekly  # abdominal pain-epigastric underwent endoscopy no obvious etiology noted.  Patient also had HIDA scan to rule out gallbladder pathology.  MRI of the back was done no acute process- Improved.   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  Stable.   # Vaccinations: Flu host [oct 2024].   # DISPOSITION: # cancel Blood tomorrow # weekly cbc/LDH/LFTs x 3 #  follow up in 3  weeks [Wed/Thurs]- MD or APP; labs- cbc/cmp; LDH; retic count; haptoglobin; D-2 possible Blood-Dr.B    All questions were answered. The patient knows to call the clinic with any problems, questions or concerns.    Gwyn Leos, MD 05/27/2023 11:36 AM

## 2023-05-27 NOTE — Assessment & Plan Note (Addendum)
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 G-4 anaphylactic reaction to Rituximab .   # Patient to continue prednisone  20 mg a day [ not stop until further directions.] Continue  Imuran  100 mg/day; HOLD Tavvalisse 100 mg a day [see below re: elevated LFTs. ]Continue folic acid .   # Elevated LFTs: x10 upper limit- sec to Tavallise- HOLD for now- check labs weekly  # abdominal pain-epigastric underwent endoscopy no obvious etiology noted.  Patient also had HIDA scan to rule out gallbladder pathology.  MRI of the back was done no acute process- Improved.   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  Stable.   # Vaccinations: Flu host [oct 2024].   # DISPOSITION: # cancel Blood tomorrow # weekly cbc/LDH/LFTs x 3 #  follow up in 3  weeks [Wed/Thurs]- MD or APP; labs- cbc/cmp; LDH; retic count; haptoglobin; D-2 possible Blood-Dr.B

## 2023-05-27 NOTE — Progress Notes (Signed)
 Fatigue/weakness: NO Dyspena: NO  Light headedness: NO  Blood in stool: NO    Pt will need new rx of prednisone  if you keep her on it.

## 2023-05-28 ENCOUNTER — Inpatient Hospital Stay: Attending: Internal Medicine

## 2023-05-29 LAB — HAPTOGLOBIN: Haptoglobin: <10 mg/dL — ABNORMAL LOW (ref 42–296)

## 2023-06-03 ENCOUNTER — Inpatient Hospital Stay: Attending: Internal Medicine

## 2023-06-03 DIAGNOSIS — R7989 Other specified abnormal findings of blood chemistry: Secondary | ICD-10-CM | POA: Insufficient documentation

## 2023-06-03 DIAGNOSIS — D472 Monoclonal gammopathy: Secondary | ICD-10-CM | POA: Diagnosis present

## 2023-06-03 DIAGNOSIS — R1013 Epigastric pain: Secondary | ICD-10-CM | POA: Insufficient documentation

## 2023-06-03 DIAGNOSIS — Z7952 Long term (current) use of systemic steroids: Secondary | ICD-10-CM | POA: Diagnosis not present

## 2023-06-03 DIAGNOSIS — D591 Autoimmune hemolytic anemia, unspecified: Secondary | ICD-10-CM | POA: Diagnosis present

## 2023-06-03 DIAGNOSIS — D649 Anemia, unspecified: Secondary | ICD-10-CM

## 2023-06-03 LAB — CBC WITH DIFFERENTIAL (CANCER CENTER ONLY)
Abs Immature Granulocytes: 0.01 10*3/uL (ref 0.00–0.07)
Basophils Absolute: 0 10*3/uL (ref 0.0–0.1)
Basophils Relative: 0 %
Eosinophils Absolute: 0.1 10*3/uL (ref 0.0–0.5)
Eosinophils Relative: 2 %
HCT: 32.5 % — ABNORMAL LOW (ref 36.0–46.0)
Hemoglobin: 11.8 g/dL — ABNORMAL LOW (ref 12.0–15.0)
Immature Granulocytes: 0 %
Lymphocytes Relative: 41 %
Lymphs Abs: 1.4 10*3/uL (ref 0.7–4.0)
MCH: 36.2 pg — ABNORMAL HIGH (ref 26.0–34.0)
MCHC: 36.3 g/dL — ABNORMAL HIGH (ref 30.0–36.0)
MCV: 99.7 fL (ref 80.0–100.0)
Monocytes Absolute: 0.2 10*3/uL (ref 0.1–1.0)
Monocytes Relative: 6 %
Neutro Abs: 1.7 10*3/uL (ref 1.7–7.7)
Neutrophils Relative %: 51 %
Platelet Count: 187 10*3/uL (ref 150–400)
RBC: 3.26 MIL/uL — ABNORMAL LOW (ref 3.87–5.11)
RDW: 17.9 % — ABNORMAL HIGH (ref 11.5–15.5)
WBC Count: 3.4 10*3/uL — ABNORMAL LOW (ref 4.0–10.5)
nRBC: 0 % (ref 0.0–0.2)

## 2023-06-03 LAB — CMP (CANCER CENTER ONLY)
ALT: 28 U/L (ref 0–44)
AST: 20 U/L (ref 15–41)
Albumin: 3.9 g/dL (ref 3.5–5.0)
Alkaline Phosphatase: 80 U/L (ref 38–126)
Anion gap: 8 (ref 5–15)
BUN: 13 mg/dL (ref 6–20)
CO2: 24 mmol/L (ref 22–32)
Calcium: 9.2 mg/dL (ref 8.9–10.3)
Chloride: 107 mmol/L (ref 98–111)
Creatinine: 0.62 mg/dL (ref 0.44–1.00)
GFR, Estimated: >60 mL/min (ref >60)
Glucose, Bld: 120 mg/dL — ABNORMAL HIGH (ref 70–99)
Potassium: 4.1 mmol/L (ref 3.5–5.1)
Sodium: 139 mmol/L (ref 135–145)
Total Bilirubin: 1.3 mg/dL — ABNORMAL HIGH (ref 0.0–1.2)
Total Protein: 7.2 g/dL (ref 6.5–8.1)

## 2023-06-03 LAB — LACTATE DEHYDROGENASE: LDH: 189 U/L (ref 98–192)

## 2023-06-03 LAB — RETIC PANEL
Immature Retic Fract: 14.7 % (ref 2.3–15.9)
RBC.: 3.25 MIL/uL — ABNORMAL LOW (ref 3.87–5.11)
Retic Count, Absolute: 99.5 10*3/uL (ref 19.0–186.0)
Retic Ct Pct: 3.1 % (ref 0.4–3.1)
Reticulocyte Hemoglobin: 41.6 pg (ref >27.9)

## 2023-06-04 LAB — HAPTOGLOBIN: Haptoglobin: <10 mg/dL — ABNORMAL LOW (ref 42–296)

## 2023-06-08 ENCOUNTER — Other Ambulatory Visit: Payer: Self-pay

## 2023-06-10 ENCOUNTER — Other Ambulatory Visit: Payer: Self-pay

## 2023-06-10 ENCOUNTER — Inpatient Hospital Stay

## 2023-06-10 DIAGNOSIS — D649 Anemia, unspecified: Secondary | ICD-10-CM

## 2023-06-10 DIAGNOSIS — D472 Monoclonal gammopathy: Secondary | ICD-10-CM | POA: Diagnosis not present

## 2023-06-10 LAB — CBC (CANCER CENTER ONLY)
HCT: 29.4 % — ABNORMAL LOW (ref 36.0–46.0)
Hemoglobin: 10.5 g/dL — ABNORMAL LOW (ref 12.0–15.0)
MCH: 36 pg — ABNORMAL HIGH (ref 26.0–34.0)
MCHC: 35.7 g/dL (ref 30.0–36.0)
MCV: 100.7 fL — ABNORMAL HIGH (ref 80.0–100.0)
Platelet Count: 159 10*3/uL (ref 150–400)
RBC: 2.92 MIL/uL — ABNORMAL LOW (ref 3.87–5.11)
RDW: 18.4 % — ABNORMAL HIGH (ref 11.5–15.5)
WBC Count: 3.1 10*3/uL — ABNORMAL LOW (ref 4.0–10.5)
nRBC: 0 % (ref 0.0–0.2)

## 2023-06-10 LAB — HEPATIC FUNCTION PANEL
ALT: 16 U/L (ref 0–44)
AST: 17 U/L (ref 15–41)
Albumin: 3.6 g/dL (ref 3.5–5.0)
Alkaline Phosphatase: 65 U/L (ref 38–126)
Bilirubin, Direct: 0.2 mg/dL (ref 0.0–0.2)
Indirect Bilirubin: 0.9 mg/dL (ref 0.3–0.9)
Total Bilirubin: 1.1 mg/dL (ref 0.0–1.2)
Total Protein: 6.5 g/dL (ref 6.5–8.1)

## 2023-06-10 LAB — LACTATE DEHYDROGENASE: LDH: 202 U/L — ABNORMAL HIGH (ref 98–192)

## 2023-06-15 ENCOUNTER — Other Ambulatory Visit (HOSPITAL_COMMUNITY): Payer: Self-pay

## 2023-06-15 ENCOUNTER — Other Ambulatory Visit: Payer: Self-pay | Admitting: Internal Medicine

## 2023-06-15 ENCOUNTER — Other Ambulatory Visit: Payer: Self-pay

## 2023-06-15 DIAGNOSIS — D591 Autoimmune hemolytic anemia, unspecified: Secondary | ICD-10-CM

## 2023-06-15 MED ORDER — TAVALISSE 100 MG PO TABS
100.0000 mg | ORAL_TABLET | Freq: Two times a day (BID) | ORAL | 0 refills | Status: DC
  Filled 2023-06-15 – 2023-08-21 (×3): qty 60, 30d supply, fill #0

## 2023-06-15 NOTE — Progress Notes (Addendum)
 Medication on hold pending labs. Retiming next refill call. Patient will call back with update.

## 2023-06-16 ENCOUNTER — Other Ambulatory Visit: Payer: Self-pay

## 2023-06-16 ENCOUNTER — Inpatient Hospital Stay (HOSPITAL_BASED_OUTPATIENT_CLINIC_OR_DEPARTMENT_OTHER): Admitting: Nurse Practitioner

## 2023-06-16 ENCOUNTER — Inpatient Hospital Stay

## 2023-06-16 VITALS — BP 98/62 | HR 94 | Temp 98.3°F | Resp 20 | Wt 210.9 lb

## 2023-06-16 DIAGNOSIS — D472 Monoclonal gammopathy: Secondary | ICD-10-CM | POA: Diagnosis not present

## 2023-06-16 DIAGNOSIS — D591 Autoimmune hemolytic anemia, unspecified: Secondary | ICD-10-CM | POA: Diagnosis not present

## 2023-06-16 DIAGNOSIS — D649 Anemia, unspecified: Secondary | ICD-10-CM

## 2023-06-16 LAB — HEPATIC FUNCTION PANEL
ALT: 16 U/L (ref 0–44)
AST: 19 U/L (ref 15–41)
Albumin: 3.9 g/dL (ref 3.5–5.0)
Alkaline Phosphatase: 68 U/L (ref 38–126)
Bilirubin, Direct: 0.2 mg/dL (ref 0.0–0.2)
Indirect Bilirubin: 1.1 mg/dL — ABNORMAL HIGH (ref 0.3–0.9)
Total Bilirubin: 1.3 mg/dL — ABNORMAL HIGH (ref 0.0–1.2)
Total Protein: 7.3 g/dL (ref 6.5–8.1)

## 2023-06-16 LAB — RETIC PANEL
Immature Retic Fract: 18.4 % — ABNORMAL HIGH (ref 2.3–15.9)
RBC.: 2.95 MIL/uL — ABNORMAL LOW (ref 3.87–5.11)
Retic Count, Absolute: 85.3 10*3/uL (ref 19.0–186.0)
Retic Ct Pct: 2.9 % (ref 0.4–3.1)
Reticulocyte Hemoglobin: 42.7 pg (ref >27.9)

## 2023-06-16 LAB — CBC (CANCER CENTER ONLY)
HCT: 30.1 % — ABNORMAL LOW (ref 36.0–46.0)
Hemoglobin: 10.7 g/dL — ABNORMAL LOW (ref 12.0–15.0)
MCH: 36.4 pg — ABNORMAL HIGH (ref 26.0–34.0)
MCHC: 35.5 g/dL (ref 30.0–36.0)
MCV: 102.4 fL — ABNORMAL HIGH (ref 80.0–100.0)
Platelet Count: 149 10*3/uL — ABNORMAL LOW (ref 150–400)
RBC: 2.94 MIL/uL — ABNORMAL LOW (ref 3.87–5.11)
RDW: 19.4 % — ABNORMAL HIGH (ref 11.5–15.5)
WBC Count: 3.3 10*3/uL — ABNORMAL LOW (ref 4.0–10.5)
nRBC: 0 % (ref 0.0–0.2)

## 2023-06-16 LAB — LACTATE DEHYDROGENASE: LDH: 242 U/L — ABNORMAL HIGH (ref 98–192)

## 2023-06-16 NOTE — Progress Notes (Signed)
 [facility] CONSULT NOTE  Patient Care Team: Sharyne Degree, FNP as PCP - General (Family Medicine) Gwyn Leos, MD as Consulting Physician (Oncology)  CHIEF COMPLAINTS/PURPOSE OF CONSULTATION: ANEMIA  HEMATOLOGY HISTORY  # IRON  DEFICIENCY ANEMIA CHRONIC [since 2019] AUG 2021- hb-9; MCV- 63; Iron  sat- 3%; EGD > 7 years ago [[place]]; colonoscopy/capsule-NA; PO iron  constipates.   # AUTOIMMUNE - APRIL 2024- Severe AUTOIMMUNE hemolytic anemia- DAT negative- however steroid responsive.  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  JAK2 testing negative.  However bone marrow biopsy- [MAY 2024-negative for any myelophthisis process; suggestive of hemolysis] HOLD off metformin; and detromethomorphan- Bupropion. PNH testing-NEGATIVE; complement levels.;  Hemoglobin cascade; G6PD levels-WNL. April 2024 abdominal ultrasound showed mild splenomegaly.    # 11/07/2022- RELAPSE- start Rituximab  X1 [OCT 11th, 2024] G-4-anaphylactic reaction needing EpiPen  transporting to the emergency room.-Discontinue further rituximab . JAN 1st week, 2025- .500 mg BID; mycophenolate -DC: Imuran + tavasslise [mid March 2025]   # MARCH 2025- OFF prednisone  [ stopped/tapered OFF 10 mg a day] x2 months.  mycophenolate  500 mg twice daily-acute hemolytic anemia- complicated nstay in the hospital/ICU-anaphylactic reaction to "IV Solu-Medrol "-improved with EpiPen  although she tolerated Solu-Medrol  injection well earlier in the hospital stay.  Started on prednisone  60 to 80 mg a day- # Start Imuran  100 mg/day; # DC cellcept .   # History of heavy menstrual cycles  HISTORY OF PRESENTING ILLNESS: Alone. Ambulating.  Danielle [name] 48 y.o. female with relapsed autoimmune hemolytic anemia steroid responsive -but DAT negative [ [suspect IgA] currently oral steroids + Imuran + tavasslise [mid March  2025] is here for follow-up. She has been holding tavasslise. Continues prednisone . Feels somewhat more short of breath in the past couple of weeks. Abdominal pain improved. Energy is stable.   Review of Systems  Constitutional:  Negative for chills, diaphoresis, fever and weight loss.  HENT:  Negative for nosebleeds and sore throat.   Eyes:  Negative for double vision.  Respiratory:  Negative for hemoptysis and sputum production.   Cardiovascular:  Negative for chest pain, palpitations and orthopnea.  Gastrointestinal:  Negative for abdominal pain, blood in stool, constipation, diarrhea, heartburn and melena.  Genitourinary:  Negative for dysuria, frequency and urgency.  Musculoskeletal:  Negative for back pain and joint pain.  Skin: Negative.  Negative for itching and rash.  Neurological:  Negative for focal weakness, weakness and headaches.  Endo/Heme/Allergies:  Does not bruise/bleed easily.  Psychiatric/Behavioral:  Negative for depression. The patient is not nervous/anxious.    MEDICAL HISTORY:  Past Medical History:  Diagnosis Date   Anemia    Depression    Hepatic steatosis 04/01/2023   SURGICAL HISTORY: Past Surgical History:  Procedure Laterality Date   ABDOMINAL HYSTERECTOMY     COLONOSCOPY WITH PROPOFOL  N/A 05/12/2022   Procedure: COLONOSCOPY WITH PROPOFOL ;  Surgeon: Marnee Sink, MD;  Location: [facility] ENDOSCOPY;  Service: Endoscopy;  Laterality: N/A;   ESOPHAGOGASTRODUODENOSCOPY N/A 04/08/2023   Procedure: EGD (ESOPHAGOGASTRODUODENOSCOPY);  Surgeon: Toledo, Alphonsus Jeans, MD;  Location: [facility] ENDOSCOPY;  Service: Gastroenterology;  Laterality: N/A;   ESOPHAGOGASTRODUODENOSCOPY (EGD) WITH PROPOFOL  N/A 05/12/2022   Procedure: ESOPHAGOGASTRODUODENOSCOPY (EGD) WITH PROPOFOL ;  Surgeon: Marnee Sink, MD;  Location: [facility] ENDOSCOPY;  Service: Endoscopy;  Laterality: N/A;   IR BONE MARROW BIOPSY & ASPIRATION  05/28/2022   TONSILLECTOMY     SOCIAL HISTORY: Social History   Socioeconomic  History   Marital status: Single  Spouse name: Not on file   Number of children: Not on file   Years of education: Not on file   Highest education level: Not on file  Occupational History   Not on file  Tobacco Use   Smoking status: Former    Current packs/day: 0.00    Average packs/day: 0.5 packs/day for 25.0 years (12.5 ttl pk-yrs)    Types: Cigarettes    Start date: 05/09/1997    Quit date: 05/10/2022    Years since quitting: 1.1   Smokeless tobacco: Never  Vaping Use   Vaping status: Never Used  Substance and Sexual Activity   Alcohol use: Not Currently   Drug use: Never   Sexual activity: Not Currently  Other Topics Concern   Not on file  Social History Narrative   Lives in [place] with 4 kids; stay at home [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    Social Drivers of Corporate investment banker Strain: Not on file  Food Insecurity: No Food Insecurity (04/01/2023)   Hunger Vital Sign    Worried About Running Out of Food in the Last Year: Never true    Ran Out of Food in the Last Year: Never true  Transportation Needs: No Transportation Needs (04/01/2023)   PRAPARE - Administrator, Civil Service (Medical): No    Lack of Transportation (Non-Medical): No  Physical Activity: Not on file  Stress: Not on file  Social Connections: Unknown (04/01/2023)   Social Connection and Isolation Panel [NHANES]    Frequency of Communication with Friends and Family: More than three times a week    Frequency of Social Gatherings with Friends and Family: More than three times a week    Attends Religious Services: More than 4 times per year    Active Member of [employer] or Organizations: No    Attends Banker Meetings: Never    Marital Status: Patient declined  Catering manager Violence: Not At Risk (04/01/2023)   Humiliation, Afraid, Rape, and Kick questionnaire    Fear of Current or Ex-Partner: No    Emotionally Abused: No    Physically Abused: No     Sexually Abused: No   FAMILY HISTORY: Family History  Problem Relation Age of Onset   Breast cancer Paternal Aunt    ALLERGIES:  is allergic to rituxan  [rituximab ] and solu-medrol  [methylprednisolone ].  MEDICATIONS:  Current Outpatient Medications  Medication Sig Dispense Refill   albuterol  (VENTOLIN  HFA) 108 (90 Base) MCG/ACT inhaler Inhale 2 puffs into the lungs every 6 (six) hours as needed for wheezing or shortness of breath. 8 g 2   azathioprine  (IMURAN ) 100 MG tablet Take 1 tablet (100 mg total) by mouth daily. 30 tablet 3   famotidine  (PEPCID ) 20 MG tablet Take 1 tablet (20 mg total) by mouth 2 (two) times daily. 60 tablet 0   folic acid  (FOLVITE ) 1 MG tablet Take 1 tablet (1 mg total) by mouth daily. 90 tablet 1   predniSONE  (DELTASONE ) 10 MG tablet 6 tabs daily until hematologist follow-up (Patient taking differently: 6 tabs daily until hematologist follow-up(pt has been taking 40 mg daily as of 05/05/23-aj)) 84 tablet 0   venlafaxine XR (EFFEXOR-XR) 37.5 MG 24 hr capsule Take 37.5 mg by mouth daily.     fostamatinib  disodium (TAVALISSE ) 100 MG tablet Take 1 tablet (100 mg total) by mouth 2 (two) times daily. (Patient not taking: Reported on 06/16/2023) 60 tablet 0   [Paused] metFORMIN (GLUCOPHAGE) 500 MG tablet  Take 500 mg by mouth 2 (two) times daily with a meal. (Patient not taking: Reported on 04/01/2023)     pantoprazole  (PROTONIX ) 40 MG tablet Take 1 tablet (40 mg total) by mouth daily. (Patient not taking: Reported on 06/16/2023) 30 tablet 1   [Paused] Semaglutide-Weight Management (WEGOVY) 0.25 MG/0.5ML SOAJ Inject 0.25 mg into the skin once a week. (Patient not taking: Reported on 06/16/2023)     No current facility-administered medications for this visit.   PHYSICAL EXAMINATION: Vitals:   06/16/23 0952  BP: 98/62  Pulse: 94  Resp: 20  Temp: 98.3 F (36.8 C)  SpO2: 100%   Filed Weights   06/16/23 0952  Weight: 210 lb 14.4 oz (95.7 kg)   Physical Exam HENT:      Head: Normocephalic and atraumatic.     Mouth/Throat:     Pharynx: No oropharyngeal exudate.  Eyes:     Pupils: Pupils are equal, round, and reactive to light.  Cardiovascular:     Rate and Rhythm: Normal rate and regular rhythm.  Pulmonary:     Effort: Pulmonary effort is normal. No respiratory distress.     Breath sounds: Normal breath sounds. No wheezing.  Abdominal:     General: Bowel sounds are normal. There is no distension.     Palpations: Abdomen is soft. There is no mass.     Tenderness: There is no abdominal tenderness. There is no guarding or rebound.  Musculoskeletal:        General: No tenderness. Normal range of motion.     Cervical back: Normal range of motion and neck supple.  Skin:    General: Skin is warm.  Neurological:     Mental Status: She is alert and oriented to person, place, and time.  Psychiatric:        Mood and Affect: Affect normal.    LABORATORY DATA:  I have reviewed the data as listed Lab Results  Component Value Date   WBC 3.3 (L) 06/16/2023   HGB 10.7 (L) 06/16/2023   HCT 30.1 (L) 06/16/2023   MCV 102.4 (H) 06/16/2023   PLT 149 (L) 06/16/2023   Recent Labs    04/09/23 0417 04/16/23 0821 05/05/23 1022 05/27/23 1011 06/03/23 1048 06/10/23 1042 06/16/23 0937  NA 139   < > 138 139 139  --   --   K 3.6   < > 4.4 4.0 4.1  --   --   CL 108   < > 104 106 107  --   --   CO2 23   < > 25 25 24   --   --   GLUCOSE 86   < > 89 108* 120*  --   --   BUN 10   < > 12 11 13   --   --   CREATININE 0.70   < > 0.70 0.53 0.62  --   --   CALCIUM 8.6*   < > 8.9 9.0 9.2  --   --   GFRNONAA >60   < > >60 >60 >60  --   --   PROT 5.1*  --  7.4 7.2 7.2 6.5 7.3  ALBUMIN 2.9*  --  3.9 3.9 3.9 3.6 3.9  AST 15  --  19 457* 20 17 19   ALT 18  --  63* 251* 28 16 16   ALKPHOS 32*  --  119 103 80 65 68  BILITOT 1.6*  --  1.4* 1.7* 1.3* 1.1 1.3*  BILIDIR 0.2  --   --   --   --  0.2 0.2  IBILI 1.4*  --   --   --   --  0.9 1.1*   < > = values in this interval not  displayed.   Component Ref Range & Units (hover) 09:37 (06/16/23) 6 d ago (06/10/23) 13 d ago (06/03/23) 2 wk ago (05/27/23) 1 mo ago (05/05/23) 2 mo ago (04/16/23) 2 mo ago (04/08/23)  LDH 242 High  202 High  CM 189 CM 575 High  CM 165 CM 278 High  CM 729 High    No results found.  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 G-4 anaphylactic reaction to Rituximab .    # Patient to continue prednisone  20 mg a day [not stop until further directions.] Continue  Imuran  100 mg/day. HOLD Tavalisse  100 mg a day [see below re: elevated LFTs.]. Continue folic acid .    # Elevated LFTs: x10 upper limit- sec to Tavalisse . LFTs improved. Continue to hold tavallise. Check labs weekly.    # Abdominal pain-epigastric underwent endoscopy no obvious etiology noted.  Patient also had HIDA scan to rule out gallbladder pathology.  MRI of the back was done no acute process- Improved.    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  Stable.    # Vaccinations: Flu host [oct 2024].    # DISPOSITION: Cancel blood tomorrow 1 week- lab (cbc, ldh, lfts) 2 week- lab (cbc, ldh, lfts) 3 weeks- lab (cbc, ldh, lfts, retic count, haptoglobin), Dr Valentine Gasmen D2- possible blood- la  No problem-specific Assessment & Plan notes found for this encounter.  All questions were answered. The patient knows to call the clinic with any problems, questions or concerns.   Nelda Balsam, NP 06/16/2023

## 2023-06-17 ENCOUNTER — Telehealth: Payer: Self-pay | Admitting: Internal Medicine

## 2023-06-17 ENCOUNTER — Inpatient Hospital Stay

## 2023-06-17 NOTE — Telephone Encounter (Signed)
 I reviewed the labs-hemoglobin trending down LDH is rising concerning for hemolysis.  LFTs-normalized.  Recommend starting Tavalisse  at 100 mg/day-continue cbc/LFTs checks and weekly basis.  [name]- Staff informed.

## 2023-06-18 DIAGNOSIS — K2930 Chronic superficial gastritis without bleeding: Secondary | ICD-10-CM | POA: Insufficient documentation

## 2023-06-18 DIAGNOSIS — R1013 Epigastric pain: Secondary | ICD-10-CM | POA: Insufficient documentation

## 2023-06-18 DIAGNOSIS — K219 Gastro-esophageal reflux disease without esophagitis: Secondary | ICD-10-CM | POA: Insufficient documentation

## 2023-06-23 ENCOUNTER — Inpatient Hospital Stay

## 2023-06-25 ENCOUNTER — Other Ambulatory Visit (HOSPITAL_COMMUNITY): Payer: Self-pay

## 2023-06-29 ENCOUNTER — Other Ambulatory Visit: Payer: Self-pay

## 2023-06-30 ENCOUNTER — Inpatient Hospital Stay: Attending: Internal Medicine

## 2023-06-30 ENCOUNTER — Inpatient Hospital Stay: Admitting: Nurse Practitioner

## 2023-06-30 DIAGNOSIS — R197 Diarrhea, unspecified: Secondary | ICD-10-CM | POA: Diagnosis not present

## 2023-06-30 DIAGNOSIS — R112 Nausea with vomiting, unspecified: Secondary | ICD-10-CM | POA: Insufficient documentation

## 2023-06-30 DIAGNOSIS — D472 Monoclonal gammopathy: Secondary | ICD-10-CM | POA: Diagnosis present

## 2023-06-30 DIAGNOSIS — E869 Volume depletion, unspecified: Secondary | ICD-10-CM | POA: Insufficient documentation

## 2023-06-30 DIAGNOSIS — D591 Autoimmune hemolytic anemia, unspecified: Secondary | ICD-10-CM | POA: Diagnosis present

## 2023-06-30 LAB — CBC WITH DIFFERENTIAL (CANCER CENTER ONLY)
Abs Immature Granulocytes: 0.02 10*3/uL (ref 0.00–0.07)
Basophils Absolute: 0 10*3/uL (ref 0.0–0.1)
Basophils Relative: 0 %
Eosinophils Absolute: 0.1 10*3/uL (ref 0.0–0.5)
Eosinophils Relative: 3 %
HCT: 27.3 % — ABNORMAL LOW (ref 36.0–46.0)
Hemoglobin: 9.6 g/dL — ABNORMAL LOW (ref 12.0–15.0)
Immature Granulocytes: 1 %
Lymphocytes Relative: 34 %
Lymphs Abs: 1.3 10*3/uL (ref 0.7–4.0)
MCH: 37.1 pg — ABNORMAL HIGH (ref 26.0–34.0)
MCHC: 35.2 g/dL (ref 30.0–36.0)
MCV: 105.4 fL — ABNORMAL HIGH (ref 80.0–100.0)
Monocytes Absolute: 0.2 10*3/uL (ref 0.1–1.0)
Monocytes Relative: 4 %
Neutro Abs: 2.2 10*3/uL (ref 1.7–7.7)
Neutrophils Relative %: 58 %
Platelet Count: 152 10*3/uL (ref 150–400)
RBC: 2.59 MIL/uL — ABNORMAL LOW (ref 3.87–5.11)
RDW: 21.4 % — ABNORMAL HIGH (ref 11.5–15.5)
WBC Count: 3.8 10*3/uL — ABNORMAL LOW (ref 4.0–10.5)
nRBC: 0 % (ref 0.0–0.2)

## 2023-06-30 LAB — HEPATIC FUNCTION PANEL
ALT: 19 U/L (ref 0–44)
AST: 21 U/L (ref 15–41)
Albumin: 3.7 g/dL (ref 3.5–5.0)
Alkaline Phosphatase: 70 U/L (ref 38–126)
Bilirubin, Direct: 0.2 mg/dL (ref 0.0–0.2)
Indirect Bilirubin: 1.1 mg/dL — ABNORMAL HIGH (ref 0.3–0.9)
Total Bilirubin: 1.3 mg/dL — ABNORMAL HIGH (ref 0.0–1.2)
Total Protein: 6.6 g/dL (ref 6.5–8.1)

## 2023-06-30 LAB — RETIC PANEL
Immature Retic Fract: 25.2 % — ABNORMAL HIGH (ref 2.3–15.9)
RBC.: 2.56 MIL/uL — ABNORMAL LOW (ref 3.87–5.11)
Retic Count, Absolute: 103.7 10*3/uL (ref 19.0–186.0)
Retic Ct Pct: 4.1 % — ABNORMAL HIGH (ref 0.4–3.1)
Reticulocyte Hemoglobin: 43.7 pg (ref >27.9)

## 2023-06-30 LAB — SAMPLE TO BLOOD BANK

## 2023-06-30 LAB — LACTATE DEHYDROGENASE: LDH: 343 U/L — ABNORMAL HIGH (ref 98–192)

## 2023-06-30 NOTE — Progress Notes (Signed)
 Virtual Visit Progress Note  Symptom Management Clinic  [facility] Department of the [place]. [facility] [address], [address] [place], [place] [address] [phone] (phone) [phone] (fax)  I connected with Danielle [name] on 06/30/23 at  2:45 PM EDT by telephone visit and verified that I am speaking with the correct person using two identifiers.   I discussed the limitations, risks, security and privacy concerns of performing an evaluation and management service by telemedicine and the availability of in-person appointments. I also discussed with the patient that there may be a patient responsible charge related to this service. The patient expressed understanding and agreed to proceed.   Other persons participating in the visit and their role in the encounter: none   Patient's location: home  Provider's location: clinic   Chief Complaint: AIHA    Patient Care Team: Sharyne Degree, FNP as PCP - General (Family Medicine) Gwyn Leos, MD as Consulting Physician (Oncology)   Name of the patient: Danielle [name]  [id number]  09/30/1975   Date of visit: 06/30/23  Chief complaint/ Reason for visit- lab results  Heme/Onc history:  Oncology History   No history exists.    Interval history- Danielle [name] is a 48 y.o. female who agrees to telephone visit to discuss lab results. She generally is feeling fatigued and unwell. Similar to when her hemogloin has dropped int he past. Compliant with medications.   Review of systems- Review of Systems  Constitutional:  Positive for malaise/fatigue. Negative for chills.  Respiratory:  Negative for shortness of breath.   Cardiovascular:  Negative for chest pain.  Gastrointestinal:  Negative for abdominal pain, constipation, diarrhea, nausea and vomiting.    Allergies  Allergen Reactions   Rituxan  [Rituximab ] Anaphylaxis   Solu-Medrol  [Methylprednisolone ]  Anaphylaxis    Resolved after EpiPen  on 04/02/2023 (After Solu-medrol  250 mg iv, but no reaction after Solu-medrol  100 mg on 04/01/23 weird)   Past Medical History:  Diagnosis Date   Anemia    Depression    Hepatic steatosis 04/01/2023   Past Surgical History:  Procedure Laterality Date   ABDOMINAL HYSTERECTOMY     COLONOSCOPY WITH PROPOFOL  N/A 05/12/2022   Procedure: COLONOSCOPY WITH PROPOFOL ;  Surgeon: Marnee Sink, MD;  Location: [facility] ENDOSCOPY;  Service: Endoscopy;  Laterality: N/A;   ESOPHAGOGASTRODUODENOSCOPY N/A 04/08/2023   Procedure: EGD (ESOPHAGOGASTRODUODENOSCOPY);  Surgeon: Toledo, Alphonsus Jeans, MD;  Location: [facility] ENDOSCOPY;  Service: Gastroenterology;  Laterality: N/A;   ESOPHAGOGASTRODUODENOSCOPY (EGD) WITH PROPOFOL  N/A 05/12/2022   Procedure: ESOPHAGOGASTRODUODENOSCOPY (EGD) WITH PROPOFOL ;  Surgeon: Marnee Sink, MD;  Location: [facility] ENDOSCOPY;  Service: Endoscopy;  Laterality: N/A;   IR BONE MARROW BIOPSY & ASPIRATION  05/28/2022   TONSILLECTOMY     Social History   Socioeconomic History   Marital status: Single    Spouse name: Not on file   Number of children: Not on file   Years of education: Not on file   Highest education level: Not on file  Occupational History   Not on file  Tobacco Use   Smoking status: Former    Current packs/day: 0.00    Average packs/day: 0.5 packs/day for 25.0 years (12.5 ttl pk-yrs)    Types: Cigarettes    Start date: 05/09/1997    Quit date: 05/10/2022    Years since quitting: 1.1   Smokeless tobacco: Never  Vaping Use   Vaping status: Never Used  Substance and Sexual Activity   Alcohol  use: Not Currently   Drug use: Never   Sexual activity: Not Currently  Other Topics Concern   Not on file  Social History Narrative   Lives in [place] with 4 kids; stay at home [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    Social Drivers of Corporate investment banker Strain: Not on file  Food Insecurity: Food Insecurity Present (06/18/2023)    Received from [facility]   Hunger Vital Sign    Worried About Running Out of Food in the Last Year: Sometimes true    Ran Out of Food in the Last Year: Sometimes true  Transportation Needs: No Transportation Needs (06/18/2023)   Received from [facility] - Transportation    In the past 12 months, has lack of transportation kept you from medical appointments or from getting medications?: No    Lack of Transportation (Non-Medical): No  Physical Activity: Not on file  Stress: Not on file  Social Connections: Unknown (04/01/2023)   Social Connection and Isolation Panel [NHANES]    Frequency of Communication with Friends and Family: More than three times a week    Frequency of Social Gatherings with Friends and Family: More than three times a week    Attends Religious Services: More than 4 times per year    Active Member of [employer] or Organizations: No    Attends Banker Meetings: Never    Marital Status: Patient declined  Catering manager Violence: Not At Risk (04/01/2023)   Humiliation, Afraid, Rape, and Kick questionnaire    Fear of Current or Ex-Partner: No    Emotionally Abused: No    Physically Abused: No    Sexually Abused: No   Family History  Problem Relation Age of Onset   Breast cancer Paternal Aunt    Current Outpatient Medications:    albuterol  (VENTOLIN  HFA) 108 (90 Base) MCG/ACT inhaler, Inhale 2 puffs into the lungs every 6 (six) hours as needed for wheezing or shortness of breath., Disp: 8 g, Rfl: 2   azathioprine  (IMURAN ) 100 MG tablet, Take 1 tablet (100 mg total) by mouth daily., Disp: 30 tablet, Rfl: 3   famotidine  (PEPCID ) 20 MG tablet, Take 1 tablet (20 mg total) by mouth 2 (two) times daily., Disp: 60 tablet, Rfl: 0   folic acid  (FOLVITE ) 1 MG tablet, Take 1 tablet (1 mg total) by mouth daily., Disp: 90 tablet, Rfl: 1   fostamatinib  disodium (TAVALISSE ) 100 MG tablet, Take 1 tablet (100 mg total) by mouth 2  (two) times daily. (Patient not taking: Reported on 06/16/2023), Disp: 60 tablet, Rfl: 0   [Paused] metFORMIN (GLUCOPHAGE) 500 MG tablet, Take 500 mg by mouth 2 (two) times daily with a meal. (Patient not taking: Reported on 04/01/2023), Disp: , Rfl:    pantoprazole  (PROTONIX ) 40 MG tablet, Take 1 tablet (40 mg total) by mouth daily. (Patient not taking: Reported on 06/16/2023), Disp: 30 tablet, Rfl: 1   predniSONE  (DELTASONE ) 10 MG tablet, 6 tabs daily until hematologist follow-up (Patient taking differently: 6 tabs daily until hematologist follow-up(pt has been taking 40 mg daily as of 05/05/23-aj)), Disp: 84 tablet, Rfl: 0   [Paused] Semaglutide-Weight Management (WEGOVY) 0.25 MG/0.5ML SOAJ, Inject 0.25 mg into the skin once a week. (Patient not taking: Reported on 06/16/2023), Disp: , Rfl:    venlafaxine XR (EFFEXOR-XR) 37.5 MG 24 hr capsule, Take 37.5 mg by mouth daily., Disp: , Rfl:   Physical exam: Exam limited due to  telemedicine  There were no vitals filed for this visit. Physical Exam Pulmonary:     Effort: No respiratory distress.  Neurological:     Mental Status: She is alert and oriented to person, place, and time.        Latest Ref Rng & Units 06/30/2023    9:58 AM  CMP  Total Protein 6.5 - 8.1 g/dL 6.6   Total Bilirubin 0.0 - 1.2 mg/dL 1.3   Alkaline Phos 38 - 126 U/L 70   AST 15 - 41 U/L 21   ALT 0 - 44 U/L 19       Latest Ref Rng & Units 06/30/2023    9:58 AM  CBC  WBC 4.0 - 10.5 K/uL 3.8   Hemoglobin 12.0 - 15.0 g/dL 9.6   Hematocrit 16.1 - 46.0 % 27.3   Platelets 150 - 400 K/uL 152    Component Ref Range & Units (hover) 09:58 (06/30/23) 2 wk ago (06/16/23) 2 wk ago (06/10/23) 3 wk ago (06/03/23) 1 mo ago (05/27/23) 1 mo ago (05/05/23) 2 mo ago (04/16/23)  LDH 343 High  242 High  CM 202 High  CM 189 CM 575 High  CM 165 CM 278 High  C   No results found.  Assessment and plan- Patient is a 48 y.o. 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 LDH rising, now 343 consistent with ongoing hemolysis. Hmg has dropped to 9.6. Discussed with Dr. Valentine Gasmen who recommends increasing prednisone  to 40 mg/day and tavalisse  to 100 mg BID. We will continue to monitor her counts weekly with cbc, ldh, lft.   Disposition:  1 week lab only F/u as scheduled otherwise- la  Visit Diagnosis 1. Autoimmune hemolytic anemia, unspecified (HCC)     Patient expressed understanding and was in agreement with this plan. She also understands that She can call clinic at any time with any questions, concerns, or complaints.   I discussed the assessment and treatment plan with the patient. The patient was provided an opportunity to ask questions and all were answered. The patient agreed with the plan and demonstrated an understanding of the instructions.   The patient was advised to call back or seek an in-person evaluation if the symptoms worsen or if the condition fails to improve as anticipated.   I spent 15 minutes on this telephone encounter.   Thank you for allowing me to participate in the care of this very pleasant patient.   Kenney Peacemaker, DNP, AGNP-C [facility]

## 2023-07-01 LAB — HAPTOGLOBIN: Haptoglobin: <10 mg/dL — ABNORMAL LOW (ref 42–296)

## 2023-07-06 ENCOUNTER — Telehealth: Payer: Self-pay | Admitting: *Deleted

## 2023-07-06 ENCOUNTER — Other Ambulatory Visit: Payer: Self-pay | Admitting: *Deleted

## 2023-07-06 ENCOUNTER — Other Ambulatory Visit: Payer: Self-pay

## 2023-07-06 DIAGNOSIS — D649 Anemia, unspecified: Secondary | ICD-10-CM

## 2023-07-06 DIAGNOSIS — D591 Autoimmune hemolytic anemia, unspecified: Secondary | ICD-10-CM

## 2023-07-06 MED ORDER — PREDNISONE 10 MG PO TABS: ORAL_TABLET | ORAL | 0 refills | Status: DC

## 2023-07-06 NOTE — Telephone Encounter (Signed)
 ERROR duplicate and did not need 2nd telephone call

## 2023-07-06 NOTE — Telephone Encounter (Signed)
 Patient called for refill of prednisone  and Danielle [name] is going to speak to Dr. Valentine Gasmen how much she needs and then let pt. know

## 2023-07-07 ENCOUNTER — Inpatient Hospital Stay

## 2023-07-07 DIAGNOSIS — D591 Autoimmune hemolytic anemia, unspecified: Secondary | ICD-10-CM

## 2023-07-07 DIAGNOSIS — D649 Anemia, unspecified: Secondary | ICD-10-CM

## 2023-07-07 LAB — SAMPLE TO BLOOD BANK

## 2023-07-07 LAB — CBC WITH DIFFERENTIAL (CANCER CENTER ONLY)
Abs Immature Granulocytes: 0.01 10*3/uL (ref 0.00–0.07)
Basophils Absolute: 0 10*3/uL (ref 0.0–0.1)
Basophils Relative: 0 %
Eosinophils Absolute: 0.1 10*3/uL (ref 0.0–0.5)
Eosinophils Relative: 3 %
HCT: 25 % — ABNORMAL LOW (ref 36.0–46.0)
Hemoglobin: 8.8 g/dL — ABNORMAL LOW (ref 12.0–15.0)
Immature Granulocytes: 0 %
Lymphocytes Relative: 38 %
Lymphs Abs: 1.2 10*3/uL (ref 0.7–4.0)
MCH: 37.3 pg — ABNORMAL HIGH (ref 26.0–34.0)
MCHC: 35.2 g/dL (ref 30.0–36.0)
MCV: 105.9 fL — ABNORMAL HIGH (ref 80.0–100.0)
Monocytes Absolute: 0.1 10*3/uL (ref 0.1–1.0)
Monocytes Relative: 4 %
Neutro Abs: 1.8 10*3/uL (ref 1.7–7.7)
Neutrophils Relative %: 55 %
Platelet Count: 147 10*3/uL — ABNORMAL LOW (ref 150–400)
RBC: 2.36 MIL/uL — ABNORMAL LOW (ref 3.87–5.11)
RDW: 22.2 % — ABNORMAL HIGH (ref 11.5–15.5)
WBC Count: 3.3 10*3/uL — ABNORMAL LOW (ref 4.0–10.5)
nRBC: 0 % (ref 0.0–0.2)

## 2023-07-07 LAB — HEPATIC FUNCTION PANEL
ALT: 19 U/L (ref 0–44)
AST: 26 U/L (ref 15–41)
Albumin: 3.6 g/dL (ref 3.5–5.0)
Alkaline Phosphatase: 59 U/L (ref 38–126)
Bilirubin, Direct: 0.2 mg/dL (ref 0.0–0.2)
Indirect Bilirubin: 1.1 mg/dL — ABNORMAL HIGH (ref 0.3–0.9)
Total Bilirubin: 1.3 mg/dL — ABNORMAL HIGH (ref 0.0–1.2)
Total Protein: 6.6 g/dL (ref 6.5–8.1)

## 2023-07-07 LAB — LACTATE DEHYDROGENASE: LDH: 488 U/L — ABNORMAL HIGH (ref 98–192)

## 2023-07-09 LAB — HAPTOGLOBIN: Haptoglobin: <10 mg/dL — ABNORMAL LOW (ref 42–296)

## 2023-07-15 ENCOUNTER — Inpatient Hospital Stay

## 2023-07-15 ENCOUNTER — Encounter: Payer: Self-pay | Admitting: Internal Medicine

## 2023-07-15 ENCOUNTER — Other Ambulatory Visit

## 2023-07-15 ENCOUNTER — Inpatient Hospital Stay (HOSPITAL_BASED_OUTPATIENT_CLINIC_OR_DEPARTMENT_OTHER): Admitting: Internal Medicine

## 2023-07-15 ENCOUNTER — Ambulatory Visit: Admitting: Internal Medicine

## 2023-07-15 DIAGNOSIS — D649 Anemia, unspecified: Secondary | ICD-10-CM | POA: Diagnosis not present

## 2023-07-15 DIAGNOSIS — D591 Autoimmune hemolytic anemia, unspecified: Secondary | ICD-10-CM | POA: Diagnosis not present

## 2023-07-15 LAB — CMP (CANCER CENTER ONLY)
ALT: 23 U/L (ref 0–44)
AST: 32 U/L (ref 15–41)
Albumin: 3.8 g/dL (ref 3.5–5.0)
Alkaline Phosphatase: 61 U/L (ref 38–126)
Anion gap: 8 (ref 5–15)
BUN: 11 mg/dL (ref 6–20)
CO2: 26 mmol/L (ref 22–32)
Calcium: 8.9 mg/dL (ref 8.9–10.3)
Chloride: 106 mmol/L (ref 98–111)
Creatinine: 0.47 mg/dL (ref 0.44–1.00)
GFR, Estimated: >60 mL/min (ref >60)
Glucose, Bld: 108 mg/dL — ABNORMAL HIGH (ref 70–99)
Potassium: 4.1 mmol/L (ref 3.5–5.1)
Sodium: 140 mmol/L (ref 135–145)
Total Bilirubin: 2.1 mg/dL — ABNORMAL HIGH (ref 0.0–1.2)
Total Protein: 6.8 g/dL (ref 6.5–8.1)

## 2023-07-15 LAB — RETIC PANEL
Immature Retic Fract: 16 % — ABNORMAL HIGH (ref 2.3–15.9)
RBC.: 2.16 MIL/uL — ABNORMAL LOW (ref 3.87–5.11)
Retic Count, Absolute: 96.6 10*3/uL (ref 19.0–186.0)
Retic Ct Pct: 4.5 % — ABNORMAL HIGH (ref 0.4–3.1)
Reticulocyte Hemoglobin: 43.5 pg (ref >27.9)

## 2023-07-15 LAB — HEPATIC FUNCTION PANEL
ALT: 22 U/L (ref 0–44)
AST: 31 U/L (ref 15–41)
Albumin: 3.8 g/dL (ref 3.5–5.0)
Alkaline Phosphatase: 60 U/L (ref 38–126)
Bilirubin, Direct: 0.3 mg/dL — ABNORMAL HIGH (ref 0.0–0.2)
Indirect Bilirubin: 1.9 mg/dL — ABNORMAL HIGH (ref 0.3–0.9)
Total Bilirubin: 2.2 mg/dL — ABNORMAL HIGH (ref 0.0–1.2)
Total Protein: 6.7 g/dL (ref 6.5–8.1)

## 2023-07-15 LAB — CBC WITH DIFFERENTIAL (CANCER CENTER ONLY)
Abs Immature Granulocytes: 0.01 10*3/uL (ref 0.00–0.07)
Basophils Absolute: 0 10*3/uL (ref 0.0–0.1)
Basophils Relative: 0 %
Eosinophils Absolute: 0.1 10*3/uL (ref 0.0–0.5)
Eosinophils Relative: 2 %
HCT: 23.2 % — ABNORMAL LOW (ref 36.0–46.0)
Hemoglobin: 8.3 g/dL — ABNORMAL LOW (ref 12.0–15.0)
Immature Granulocytes: 0 %
Lymphocytes Relative: 34 %
Lymphs Abs: 1 10*3/uL (ref 0.7–4.0)
MCH: 38.2 pg — ABNORMAL HIGH (ref 26.0–34.0)
MCHC: 35.8 g/dL (ref 30.0–36.0)
MCV: 106.9 fL — ABNORMAL HIGH (ref 80.0–100.0)
Monocytes Absolute: 0.1 10*3/uL (ref 0.1–1.0)
Monocytes Relative: 3 %
Neutro Abs: 1.8 10*3/uL (ref 1.7–7.7)
Neutrophils Relative %: 61 %
Platelet Count: 129 10*3/uL — ABNORMAL LOW (ref 150–400)
RBC: 2.17 MIL/uL — ABNORMAL LOW (ref 3.87–5.11)
RDW: 22.9 % — ABNORMAL HIGH (ref 11.5–15.5)
WBC Count: 2.9 10*3/uL — ABNORMAL LOW (ref 4.0–10.5)
nRBC: 0 % (ref 0.0–0.2)

## 2023-07-15 LAB — LACTATE DEHYDROGENASE: LDH: 671 U/L — ABNORMAL HIGH (ref 98–192)

## 2023-07-15 LAB — SAMPLE TO BLOOD BANK

## 2023-07-15 MED ORDER — PREDNISONE 20 MG PO TABS: ORAL_TABLET | ORAL | 0 refills | Status: DC

## 2023-07-15 NOTE — Progress Notes (Signed)
 Fatigue/weakness: YES Dyspena: YES Light headedness: NO Blood in stool: NO

## 2023-07-15 NOTE — Progress Notes (Signed)
 [facility] CONSULT NOTE  Patient Care Team: Sharyne Degree, FNP as PCP - General (Family Medicine) Gwyn Leos, MD as Consulting Physician (Oncology)  CHIEF COMPLAINTS/PURPOSE OF CONSULTATION: ANEMIA  HEMATOLOGY HISTORY  # IRON  DEFICIENCY ANEMIA CHRONIC [since 2019] AUG 2021- hb-9; MCV- 63; Iron  sat- 3%; EGD > 7 years ago [[place]]; colonoscopy/capsule-NA; PO iron  constipates.   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  JAK2 testing negative.  However bone marrow biopsy- [MAY 2024-negative for any myelophthisis process; suggestive of hemolysis] HOLD off metformin; and detromethomorphan- Bupropion. PNH testing-NEGATIVE; complement levels.;  Hemoglobin cascade; G6PD levels-WNL.  April 2024 abdominal ultrasound showed mild splenomegaly.    # 11/07/2022-RELAPSE-start  Rituximab  X1 [OCT 11th, 2024- ]G-4-anaphylactic reaction needing EpiPen  transporting to the emergency room.-Discontinue further rituximab . JAN 1st week, 2025- .500 mg BID; mycophenolate -DC:  Imuran + tavasslise [mid March 2025]   # MARCH 2025- OFF prednisone  [ stopped/tapered OFF 10 mg a day] x2 months.  mycophenolate  500 mg twice daily-acute hemolytic anemia- complicated nstay in the hospital/ICU-anaphylactic reaction to IV Solu-Medrol -improved with EpiPen  although she tolerated Solu-Medrol  injection well earlier in the hospital stay.  Started on prednisone  60 to 80 mg a day- # Start Imuran  100 mg/day; # DC cellcept .   #History of heavy menstrual cycles  HISTORY OF PRESENTING ILLNESS: Alone. Ambulating.  Danielle [name] 48 y.o.  female with relapsed autoimmune hemolytic anemia steroid responsive -but DAT negative [ [suspect IgA] currently oral steroids + Imuran + tavasslise [mid  March 2025] is here for follow-up.  Notes to have improvement of abodminal pain. Notes to be more tired now. No blood in stools or black colored stools.   Review of Systems  Constitutional:  Negative for chills, diaphoresis, fever and weight loss.  HENT:  Negative for nosebleeds and sore throat.   Eyes:  Negative for double vision.  Respiratory:  Negative for hemoptysis and sputum production.   Cardiovascular:  Negative for chest pain, palpitations and orthopnea.  Gastrointestinal:  Negative for abdominal pain, blood in stool, constipation, diarrhea, heartburn and melena.  Genitourinary:  Negative for dysuria, frequency and urgency.  Musculoskeletal:  Negative for back pain and joint pain.  Skin: Negative.  Negative for itching and rash.  Neurological:  Negative for focal weakness, weakness and headaches.  Endo/Heme/Allergies:  Does not bruise/bleed easily.  Psychiatric/Behavioral:  Negative for depression. The patient is not nervous/anxious.     MEDICAL HISTORY:  Past Medical History:  Diagnosis Date   Anemia    Depression    Hepatic steatosis 04/01/2023    SURGICAL HISTORY: Past Surgical History:  Procedure Laterality Date   ABDOMINAL HYSTERECTOMY     COLONOSCOPY WITH PROPOFOL  N/A 05/12/2022   Procedure: COLONOSCOPY WITH PROPOFOL ;  Surgeon: Marnee Sink, MD;  Location: [facility] ENDOSCOPY;  Service: Endoscopy;  Laterality: N/A;   ESOPHAGOGASTRODUODENOSCOPY N/A 04/08/2023   Procedure: EGD (ESOPHAGOGASTRODUODENOSCOPY);  Surgeon: Toledo, Alphonsus Jeans, MD;  Location: [facility] ENDOSCOPY;  Service: Gastroenterology;  Laterality: N/A;   ESOPHAGOGASTRODUODENOSCOPY (EGD) WITH PROPOFOL  N/A 05/12/2022   Procedure: ESOPHAGOGASTRODUODENOSCOPY (EGD) WITH PROPOFOL ;  Surgeon: Marnee Sink, MD;  Location: [facility] ENDOSCOPY;  Service: Endoscopy;  Laterality: N/A;   IR BONE MARROW BIOPSY & ASPIRATION  05/28/2022   TONSILLECTOMY      SOCIAL HISTORY: Social History   Socioeconomic History   Marital status:  Single  Spouse name: Not on file   Number of children: Not on file   Years of education: Not on file   Highest education level: Not on file  Occupational History   Not on file  Tobacco Use   Smoking status: Former    Current packs/day: 0.00    Average packs/day: 0.5 packs/day for 25.0 years (12.5 ttl pk-yrs)    Types: Cigarettes    Start date: 05/09/1997    Quit date: 05/10/2022    Years since quitting: 1.1   Smokeless tobacco: Never  Vaping Use   Vaping status: Never Used  Substance and Sexual Activity   Alcohol use: Not Currently   Drug use: Never   Sexual activity: Not Currently  Other Topics Concern   Not on file  Social History Narrative   Lives in [place] with 4 kids; stay at home [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    Social Drivers of Corporate investment banker Strain: Not on file  Food Insecurity: Food Insecurity Present (06/18/2023)   Received from [facility]   Hunger Vital Sign    Within the past 12 months, you worried that your food would run out before you got the money to buy more.: Sometimes true    Within the past 12 months, the food you bought just didn't last and you didn't have money to get more.: Sometimes true  Transportation Needs: No Transportation Needs (06/18/2023)   Received from [facility] - Transportation    In the past 12 months, has lack of transportation kept you from medical appointments or from getting medications?: No    Lack of Transportation (Non-Medical): No  Physical Activity: Not on file  Stress: Not on file  Social Connections: Unknown (04/01/2023)   Social Connection and Isolation Panel    Frequency of Communication with Friends and Family: More than three times a week    Frequency of Social Gatherings with Friends and Family: More than three times a week    Attends Religious Services: More than 4 times per year    Active Member of [employer] or Organizations: No    Attends  Banker Meetings: Never    Marital Status: Patient declined  Catering manager Violence: Not At Risk (04/01/2023)   Humiliation, Afraid, Rape, and Kick questionnaire    Fear of Current or Ex-Partner: No    Emotionally Abused: No    Physically Abused: No    Sexually Abused: No    FAMILY HISTORY: Family History  Problem Relation Age of Onset   Breast cancer Paternal Aunt     ALLERGIES:  is allergic to rituxan  [rituximab ] and solu-medrol  [methylprednisolone ].  MEDICATIONS:  Current Outpatient Medications  Medication Sig Dispense Refill   albuterol  (VENTOLIN  HFA) 108 (90 Base) MCG/ACT inhaler Inhale 2 puffs into the lungs every 6 (six) hours as needed for wheezing or shortness of breath. 8 g 2   azathioprine  (IMURAN ) 100 MG tablet Take 1 tablet (100 mg total) by mouth daily. 30 tablet 3   famotidine  (PEPCID ) 20 MG tablet Take 1 tablet (20 mg total) by mouth 2 (two) times daily. 60 tablet 0   folic acid  (FOLVITE ) 1 MG tablet Take 1 tablet (1 mg total) by mouth daily. 90 tablet 1   fostamatinib  disodium (TAVALISSE ) 100 MG tablet Take 1 tablet (100 mg total) by mouth 2 (two) times daily. 60 tablet 0   predniSONE  (DELTASONE ) 10 MG tablet Take 4 tablets daily for  daily dose of 40 mg 120 tablet 0   predniSONE  (DELTASONE ) 20 MG tablet Once a day with food- in AM-  Prednisone  80 mg for 1 week; and then 60 mg-do not stop until recommended. 100 tablet 0   venlafaxine XR (EFFEXOR-XR) 37.5 MG 24 hr capsule Take 37.5 mg by mouth daily.     [Paused] metFORMIN (GLUCOPHAGE) 500 MG tablet Take 500 mg by mouth 2 (two) times daily with a meal. (Patient not taking: Reported on 07/15/2023)     pantoprazole  (PROTONIX ) 40 MG tablet Take 1 tablet (40 mg total) by mouth daily. (Patient not taking: Reported on 07/15/2023) 30 tablet 1   [Paused] Semaglutide-Weight Management (WEGOVY) 0.25 MG/0.5ML SOAJ Inject 0.25 mg into the skin once a week. (Patient not taking: Reported on 07/15/2023)     No current  facility-administered medications for this visit.      PHYSICAL EXAMINATION:   Vitals:   07/15/23 0838  BP: 100/67  Pulse: 89  Resp: 20  Temp: (!) 96.7 F (35.9 C)  SpO2: 99%         Filed Weights   07/15/23 0838  Weight: 214 lb 11.2 oz (97.4 kg)         Physical Exam HENT:     Head: Normocephalic and atraumatic.     Mouth/Throat:     Pharynx: No oropharyngeal exudate.   Eyes:     Pupils: Pupils are equal, round, and reactive to light.    Cardiovascular:     Rate and Rhythm: Normal rate and regular rhythm.  Pulmonary:     Effort: Pulmonary effort is normal. No respiratory distress.     Breath sounds: Normal breath sounds. No wheezing.  Abdominal:     General: Bowel sounds are normal. There is no distension.     Palpations: Abdomen is soft. There is no mass.     Tenderness: There is no abdominal tenderness. There is no guarding or rebound.   Musculoskeletal:        General: No tenderness. Normal range of motion.     Cervical back: Normal range of motion and neck supple.   Skin:    General: Skin is warm.   Neurological:     Mental Status: She is alert and oriented to person, place, and time.   Psychiatric:        Mood and Affect: Affect normal.     LABORATORY DATA:  I have reviewed the data as listed Lab Results  Component Value Date   WBC 2.9 (L) 07/15/2023   HGB 8.3 (L) 07/15/2023   HCT 23.2 (L) 07/15/2023   MCV 106.9 (H) 07/15/2023   PLT 129 (L) 07/15/2023   Recent Labs    05/27/23 1011 06/03/23 1048 06/10/23 1042 06/30/23 0958 07/07/23 0938 07/15/23 0845  NA 139 139  --   --   --  140  K 4.0 4.1  --   --   --  4.1  CL 106 107  --   --   --  106  CO2 25 24  --   --   --  26  GLUCOSE 108* 120*  --   --   --  108*  BUN 11 13  --   --   --  11  CREATININE 0.53 0.62  --   --   --  0.47  CALCIUM 9.0 9.2  --   --   --  8.9  GFRNONAA >60 >60  --   --   --  >  60  PROT 7.2 7.2   < > 6.6 6.6 6.8  6.7  ALBUMIN 3.9 3.9   < > 3.7 3.6  3.8  3.8  AST 457* 20   < > 21 26 31   32  ALT 251* 28   < > 19 19 22  23   ALKPHOS 103 80   < > 70 59 61  60  BILITOT 1.7* 1.3*   < > 1.3* 1.3* 2.2*  2.1*  BILIDIR  --   --    < > 0.2 0.2 0.3*  IBILI  --   --    < > 1.1* 1.1* 1.9*   < > = values in this interval not displayed.     No results found.   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 G-4 anaphylactic reaction to Rituximab .   # Patient currently prednisone  40 mg a day x1 week- but hemoglobin trending down- # Prednisone  80 mg for 1 week; and then 60 mg-do not stop until recommended.   Continue  Imuran  100 mg/day; also on  Tavvalisse 100 mg a day [improved  LFTs. ]Continue folic acid .  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  Stable.   # Vaccinations: Flu host [oct 2024].   # DISPOSITION: # cancel Blood tomorrow # weekly cbc/LDH/LFTs x 3 #  follow up in 3  weeks [Wed/Thurs]- MD  ; labs- cbc/cmp; LDH; retic count; haptoglobin; D-2 possible Blood-Dr.B     All questions were answered. The patient knows to call the clinic with any problems, questions or concerns.    Gwyn Leos, MD 07/15/2023 11:01 AM

## 2023-07-15 NOTE — Addendum Note (Signed)
 Addended by: Dominique Frieze A on: 07/15/2023 11:36 AM   Modules accepted: Orders

## 2023-07-15 NOTE — Patient Instructions (Signed)
#   Prednisone  80 mg for 1 week; and then 60 mg-do not stop until recommended.

## 2023-07-15 NOTE — Assessment & Plan Note (Addendum)
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 G-4 anaphylactic reaction to Rituximab .   # Patient currently prednisone  40 mg a day x1 week- but hemoglobin trending down- # Prednisone  80 mg for 1 week; and then 60 mg-do not stop until recommended.   Continue  Imuran  100 mg/day; also on  Tavvalisse 100 mg a day [improved  LFTs. ]Continue folic acid .  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  Stable.   # Vaccinations: Flu host [oct 2024].   # DISPOSITION: # cancel Blood tomorrow # weekly cbc/LDH/LFTs x 3 #  follow up in 3  weeks [Wed/Thurs]- MD  ; labs- cbc/cmp; LDH; retic count; haptoglobin; D-2 possible Blood-Dr.B

## 2023-07-16 ENCOUNTER — Inpatient Hospital Stay

## 2023-07-16 ENCOUNTER — Ambulatory Visit

## 2023-07-17 ENCOUNTER — Encounter: Payer: Self-pay | Admitting: Internal Medicine

## 2023-07-17 ENCOUNTER — Telehealth: Payer: Self-pay | Admitting: *Deleted

## 2023-07-17 ENCOUNTER — Inpatient Hospital Stay: Admitting: Nurse Practitioner

## 2023-07-17 DIAGNOSIS — R112 Nausea with vomiting, unspecified: Secondary | ICD-10-CM | POA: Diagnosis not present

## 2023-07-17 DIAGNOSIS — R197 Diarrhea, unspecified: Secondary | ICD-10-CM

## 2023-07-17 LAB — HAPTOGLOBIN: Haptoglobin: <10 mg/dL — ABNORMAL LOW (ref 42–296)

## 2023-07-17 MED ORDER — PROCHLORPERAZINE MALEATE 10 MG PO TABS: 10.0000 mg | ORAL_TABLET | Freq: Four times a day (QID) | ORAL | 0 refills | Status: DC | PRN

## 2023-07-17 MED ORDER — ONDANSETRON HCL 8 MG PO TABS: 8.0000 mg | ORAL_TABLET | Freq: Three times a day (TID) | ORAL | 0 refills | Status: DC | PRN

## 2023-07-17 MED ORDER — DIPHENOXYLATE-ATROPINE 2.5-0.025 MG PO TABS: 2.0000 | ORAL_TABLET | Freq: Four times a day (QID) | ORAL | 0 refills | Status: DC | PRN

## 2023-07-17 NOTE — Telephone Encounter (Signed)
 I talked to patient. She said she has been sick. Everything she eats she throws up. Diarrhea is better today than it was yesterday. Stomach hurts.This all started around [facility]. She is taking 80mg  prednisone  now for 1 week. She is taking Tavalisse . I asked if this could be a stomach virus, she doesn't think so. I will ask [name] to call her back.

## 2023-07-17 NOTE — Progress Notes (Signed)
 Virtual Visit Progress Note  Symptom Management Clinic  [facility] at [facility] A Department of the [place]. [facility] [address] [place], [place] [address] [phone] (phone) [phone] (fax)  I connected with Danielle [name] on 07/17/23 at  3:30 PM EDT by telephone visit and verified that I am speaking with the correct person using two identifiers.   I discussed the limitations, risks, security and privacy concerns of performing an evaluation and management service by telemedicine and the availability of in-person appointments. I also discussed with the patient that there may be a patient responsible charge related to this service. The patient expressed understanding and agreed to proceed.   Other persons participating in the visit and their role in the encounter: none   Patient's location: home  Provider's location: clinic   Chief Complaint: Nausea, vomiting, diarrhea    Patient Care Team: Sharyne Degree, FNP as PCP - General (Family Medicine) Gwyn Leos, MD as Consulting Physician (Oncology)   Name of the patient: Danielle [name]  [id number]  23-Jan-1976   Date of visit: 07/17/23  Diagnosis- Hemolytic Anemia  Heme/Onc history:  Oncology History   No history exists.   Interval history- Danielle [name] is a 48 y.o. female with above history of hemolytic anemia, on tavalisse  and prednisone  who agrees to telephone visit to discuss symptoms. She has nausea, vomiting, diarrhea since increasing her dose of prednisone  and tavalisse  earlier this week. Says she feels terrible, tired. Tried taking mylanta without improvement in her symptoms. No fevers. Denies abdominal pain. Denies sick contacts. No persons with similar symptoms.   Review of systems- Review of Systems  Constitutional:  Positive for malaise/fatigue. Negative for chills, fever and weight loss.  HENT:  Negative for hearing loss, nosebleeds, sore  throat and tinnitus.   Eyes:  Negative for blurred vision and double vision.  Respiratory:  Negative for cough, hemoptysis, shortness of breath and wheezing.   Cardiovascular:  Negative for chest pain, palpitations and leg swelling.  Gastrointestinal:  Positive for diarrhea, nausea and vomiting. Negative for abdominal pain, blood in stool, constipation and melena.  Genitourinary:  Negative for dysuria and urgency.  Musculoskeletal:  Negative for back pain, falls, joint pain and myalgias.  Skin:  Negative for itching and rash.  Neurological:  Negative for dizziness, tingling, sensory change, loss of consciousness, weakness and headaches.  Endo/Heme/Allergies:  Negative for environmental allergies. Does not bruise/bleed easily.  Psychiatric/Behavioral:  Negative for depression. The patient is not nervous/anxious and does not have insomnia.     Allergies  Allergen Reactions   Rituxan  [Rituximab ] Anaphylaxis   Solu-Medrol  [Methylprednisolone ] Anaphylaxis    Resolved after EpiPen  on 04/02/2023 (After Solu-medrol  250 mg iv, but no reaction after Solu-medrol  100 mg on 04/01/23 weird)   Past Medical History:  Diagnosis Date   Anemia    Depression    Hepatic steatosis 04/01/2023   Past Surgical History:  Procedure Laterality Date   ABDOMINAL HYSTERECTOMY     COLONOSCOPY WITH PROPOFOL  N/A 05/12/2022   Procedure: COLONOSCOPY WITH PROPOFOL ;  Surgeon: Marnee Sink, MD;  Location: [facility] ENDOSCOPY;  Service: Endoscopy;  Laterality: N/A;   ESOPHAGOGASTRODUODENOSCOPY N/A 04/08/2023   Procedure: EGD (ESOPHAGOGASTRODUODENOSCOPY);  Surgeon: Toledo, Alphonsus Jeans, MD;  Location: [facility] ENDOSCOPY;  Service: Gastroenterology;  Laterality: N/A;   ESOPHAGOGASTRODUODENOSCOPY (EGD) WITH PROPOFOL  N/A 05/12/2022   Procedure: ESOPHAGOGASTRODUODENOSCOPY (EGD) WITH PROPOFOL ;  Surgeon: Marnee Sink, MD;  Location: [facility] ENDOSCOPY;  Service: Endoscopy;  Laterality: N/A;  IR BONE MARROW BIOPSY & ASPIRATION  05/28/2022    TONSILLECTOMY      Current Outpatient Medications:    albuterol  (VENTOLIN  HFA) 108 (90 Base) MCG/ACT inhaler, Inhale 2 puffs into the lungs every 6 (six) hours as needed for wheezing or shortness of breath., Disp: 8 g, Rfl: 2   azathioprine  (IMURAN ) 100 MG tablet, Take 1 tablet (100 mg total) by mouth daily., Disp: 30 tablet, Rfl: 3   famotidine  (PEPCID ) 20 MG tablet, Take 1 tablet (20 mg total) by mouth 2 (two) times daily., Disp: 60 tablet, Rfl: 0   folic acid  (FOLVITE ) 1 MG tablet, Take 1 tablet (1 mg total) by mouth daily., Disp: 90 tablet, Rfl: 1   fostamatinib  disodium (TAVALISSE ) 100 MG tablet, Take 1 tablet (100 mg total) by mouth 2 (two) times daily., Disp: 60 tablet, Rfl: 0   [Paused] metFORMIN (GLUCOPHAGE) 500 MG tablet, Take 500 mg by mouth 2 (two) times daily with a meal. (Patient not taking: Reported on 07/15/2023), Disp: , Rfl:    pantoprazole  (PROTONIX ) 40 MG tablet, Take 1 tablet (40 mg total) by mouth daily. (Patient not taking: Reported on 07/15/2023), Disp: 30 tablet, Rfl: 1   predniSONE  (DELTASONE ) 20 MG tablet, Once a day with food- in AM-  Prednisone  80 mg for 1 week; and then 60 mg-do not stop until recommended., Disp: 100 tablet, Rfl: 0   [Paused] Semaglutide-Weight Management (WEGOVY) 0.25 MG/0.5ML SOAJ, Inject 0.25 mg into the skin once a week. (Patient not taking: Reported on 07/15/2023), Disp: , Rfl:    venlafaxine XR (EFFEXOR-XR) 37.5 MG 24 hr capsule, Take 37.5 mg by mouth daily., Disp: , Rfl:   Physical exam: Exam limited due to telemedicine  There were no vitals filed for this visit. Physical Exam Pulmonary:     Effort: No respiratory distress.   Neurological:     Mental Status: She is oriented to person, place, and time.        Latest Ref Rng & Units 07/15/2023    8:45 AM  CMP  Glucose 70 - 99 mg/dL 161   BUN 6 - 20 mg/dL 11   Creatinine 0.96 - 1.00 mg/dL 0.45   Sodium 409 - 811 mmol/L 140   Potassium 3.5 - 5.1 mmol/L 4.1   Chloride 98 - 111 mmol/L 106    CO2 22 - 32 mmol/L 26   Calcium 8.9 - 10.3 mg/dL 8.9   Total Protein 6.5 - 8.1 g/dL 6.5 - 8.1 g/dL 6.8    6.7   Total Bilirubin 0.0 - 1.2 mg/dL 0.0 - 1.2 mg/dL 2.1    2.2   Alkaline Phos 38 - 126 U/L 38 - 126 U/L 61    60   AST 15 - 41 U/L 15 - 41 U/L 32    31   ALT 0 - 44 U/L 0 - 44 U/L 23    22       Latest Ref Rng & Units 07/15/2023    8:45 AM  CBC  WBC 4.0 - 10.5 K/uL 2.9   Hemoglobin 12.0 - 15.0 g/dL 8.3   Hematocrit 91.4 - 46.0 % 23.2   Platelets 150 - 400 K/uL 129     No images are attached to the encounter.  No results found.  Assessment and plan- Patient is a 48 y.o. female who agrees to telephone vsiit for management of:    Autoimmune Hemolytic Anemia- recently worsening ldh. Tavalisse  and prednisone  were increased. Hesitant to consider any dose reductions  given unclear relationship and her worsening hemolysis. For now, continue current doses.  Nausea, vomiting, diarrhea- unclear if related to tavalisse  and increased prednisone  vs infectious etiology? Recommend symptom based care for now. Start zofran  8 mg q8h as needed with compazine 10 mg for refractory symptoms. Recommend imodium per package instructions for diarrhea and will send prescription for lomotil for refractory symptoms.   Disposition:  Monday- lab (cbc, cmp, ldh), see me or Josh, +/- fluids- la   Visit Diagnosis 1. Nausea and vomiting, unspecified vomiting type   2. Diarrhea, unspecified type    Patient expressed understanding and was in agreement with this plan. She also understands that She can call clinic at any time with any questions, concerns, or complaints.   I discussed the assessment and treatment plan with the patient. The patient was provided an opportunity to ask questions and all were answered. The patient agreed with the plan and demonstrated an understanding of the instructions.   The patient was advised to call back or seek an in-person evaluation if the symptoms worsen or if  the condition fails to improve as anticipated.   I spent 20 minutes on this telephone encounter.   Thank you for allowing me to participate in the care of this very pleasant patient.   Kenney Peacemaker, DNP, AGNP-C [facility]  CC: Dr Valentine Gasmen

## 2023-07-20 ENCOUNTER — Other Ambulatory Visit

## 2023-07-20 ENCOUNTER — Inpatient Hospital Stay

## 2023-07-20 ENCOUNTER — Ambulatory Visit

## 2023-07-20 ENCOUNTER — Ambulatory Visit: Admitting: Nurse Practitioner

## 2023-07-20 ENCOUNTER — Inpatient Hospital Stay (HOSPITAL_BASED_OUTPATIENT_CLINIC_OR_DEPARTMENT_OTHER): Admitting: Nurse Practitioner

## 2023-07-20 ENCOUNTER — Encounter: Payer: Self-pay | Admitting: Nurse Practitioner

## 2023-07-20 VITALS — BP 92/59 | HR 98 | Temp 97.1°F | Resp 16 | Wt 207.0 lb

## 2023-07-20 DIAGNOSIS — D591 Autoimmune hemolytic anemia, unspecified: Secondary | ICD-10-CM

## 2023-07-20 DIAGNOSIS — R197 Diarrhea, unspecified: Secondary | ICD-10-CM

## 2023-07-20 DIAGNOSIS — R112 Nausea with vomiting, unspecified: Secondary | ICD-10-CM

## 2023-07-20 DIAGNOSIS — D649 Anemia, unspecified: Secondary | ICD-10-CM

## 2023-07-20 DIAGNOSIS — T50905A Adverse effect of unspecified drugs, medicaments and biological substances, initial encounter: Secondary | ICD-10-CM

## 2023-07-20 DIAGNOSIS — E860 Dehydration: Secondary | ICD-10-CM

## 2023-07-20 LAB — CBC WITH DIFFERENTIAL (CANCER CENTER ONLY)
Abs Immature Granulocytes: 0.07 10*3/uL (ref 0.00–0.07)
Basophils Absolute: 0 10*3/uL (ref 0.0–0.1)
Basophils Relative: 0 %
Eosinophils Absolute: 0.1 10*3/uL (ref 0.0–0.5)
Eosinophils Relative: 2 %
HCT: 22.1 % — ABNORMAL LOW (ref 36.0–46.0)
Hemoglobin: 8 g/dL — ABNORMAL LOW (ref 12.0–15.0)
Immature Granulocytes: 1 %
Lymphocytes Relative: 28 %
Lymphs Abs: 1.5 10*3/uL (ref 0.7–4.0)
MCH: 37.7 pg — ABNORMAL HIGH (ref 26.0–34.0)
MCHC: 36.2 g/dL — ABNORMAL HIGH (ref 30.0–36.0)
MCV: 104.2 fL — ABNORMAL HIGH (ref 80.0–100.0)
Monocytes Absolute: 0.2 10*3/uL (ref 0.1–1.0)
Monocytes Relative: 3 %
Neutro Abs: 3.6 10*3/uL (ref 1.7–7.7)
Neutrophils Relative %: 66 %
Platelet Count: 147 10*3/uL — ABNORMAL LOW (ref 150–400)
RBC: 2.12 MIL/uL — ABNORMAL LOW (ref 3.87–5.11)
RDW: 22.5 % — ABNORMAL HIGH (ref 11.5–15.5)
WBC Count: 5.4 10*3/uL (ref 4.0–10.5)
nRBC: 0.6 % — ABNORMAL HIGH (ref 0.0–0.2)

## 2023-07-20 LAB — HEPATIC FUNCTION PANEL
ALT: 70 U/L — ABNORMAL HIGH (ref 0–44)
AST: 51 U/L — ABNORMAL HIGH (ref 15–41)
Albumin: 4 g/dL (ref 3.5–5.0)
Alkaline Phosphatase: 71 U/L (ref 38–126)
Bilirubin, Direct: 0.4 mg/dL — ABNORMAL HIGH (ref 0.0–0.2)
Indirect Bilirubin: 1.7 mg/dL — ABNORMAL HIGH (ref 0.3–0.9)
Total Bilirubin: 2.1 mg/dL — ABNORMAL HIGH (ref 0.0–1.2)
Total Protein: 6.8 g/dL (ref 6.5–8.1)

## 2023-07-20 LAB — SAMPLE TO BLOOD BANK

## 2023-07-20 LAB — LACTATE DEHYDROGENASE: LDH: 637 U/L — ABNORMAL HIGH (ref 98–192)

## 2023-07-20 MED ORDER — SODIUM CHLORIDE 0.9 % IV SOLN
INTRAVENOUS | Status: DC
  Filled 2023-07-20 (×2): qty 250

## 2023-07-20 NOTE — Progress Notes (Signed)
 Symptom Management Clinic  [facility] at [facility] A Department of the [place]. [facility] [address] [place], [place] [address] [phone] (phone) [phone] (fax)  Patient Care Team: Donal Channing SQUIBB, FNP as PCP - General (Family Medicine) Rennie Cindy SAUNDERS, MD as Consulting Physician (Oncology)   Name of the patient: Danielle [name]  [id number]  12/01/1975   Date of visit: 07/20/23  Diagnosis- hemolytic anemia  Chief complaint/ Reason for visit- nausea, vomiting, diarrhea  Heme/Onc History:  # IRON  DEFICIENCY ANEMIA CHRONIC [since 2019] AUG 2021- hb-9; MCV- 63; Iron  sat- 3%; EGD > 7 years ago [[place]]; colonoscopy/capsule-NA; PO iron  constipates.    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  JAK2 testing negative.  However bone marrow biopsy- [MAY 2024-negative for any myelophthisis process; suggestive of hemolysis] HOLD off metformin; and detromethomorphan- Bupropion. PNH testing-NEGATIVE; complement levels.;  Hemoglobin cascade; G6PD levels-WNL.  April 2024 abdominal ultrasound showed mild splenomegaly.     # 11/07/2022-RELAPSE-start  Rituximab  X1 [OCT 11th, 2024- ]G-4-anaphylactic reaction needing EpiPen  transporting to the emergency room.-Discontinue further rituximab . JAN 1st week, 2025- .500 mg BID; mycophenolate -DC:  Imuran + tavasslise [mid March 2025]    # MARCH 2025- OFF prednisone  [ stopped/tapered OFF 10 mg a day] x2 months.  mycophenolate  500 mg twice daily-acute hemolytic anemia- complicated nstay in the hospital/ICU-anaphylactic reaction to IV Solu-Medrol -improved with EpiPen  although she tolerated Solu-Medrol  injection well earlier in the hospital stay.  Started on prednisone  60 to 80  mg a day- # Start Imuran  100 mg/day; # DC cellcept .    #History of heavy menstrual cycles Oncology History   No history exists.    Interval history- Danielle [name] is a 48 y.o. female with above history of hemolytic anemia, currently on prednisone  and tavalisse , who presents to symptom management clinic for complaints of nausea, vomiting, diarrhea.  Symptoms have been ongoing over the past few days and not relieved by supportive care.  She reports abdominal cramping 7 of 10.  She feels weak and tired and dizzy when changing positions.  She was seen by Dr. Rennie on 07/15/2023.  Hemoglobin was downtrending at that time.  Prednisone  was increased to 80 mg with plan to reduce to 60 mg after 1 week, imuran  100 mg/day and tavalisse  100 mg a day.  She denies sick contacts.  No fevers or chills.  Review of systems- Review of Systems  Constitutional:  Positive for malaise/fatigue and weight loss. Negative for chills and fever.  HENT:  Negative for hearing loss, nosebleeds, sore throat and tinnitus.   Eyes:  Negative for blurred vision and double vision.  Respiratory:  Positive for shortness of breath. Negative for cough, hemoptysis and wheezing.   Cardiovascular:  Negative for chest pain, palpitations and leg swelling.  Gastrointestinal:  Positive for abdominal pain, diarrhea, nausea and vomiting. Negative for blood in stool, constipation and melena.  Genitourinary:  Negative for dysuria and urgency.  Musculoskeletal:  Negative for back pain, falls, joint pain and myalgias.  Skin:  Negative for itching and rash.  Neurological:  Positive for weakness and headaches. Negative for dizziness, tingling, sensory change and loss of consciousness.  Endo/Heme/Allergies:  Negative for environmental allergies. Does not bruise/bleed easily.  Psychiatric/Behavioral:  Negative for depression. The patient is not nervous/anxious and does not have insomnia.  Allergies  Allergen Reactions   Rituxan  Aquilina.Barrett ]  Anaphylaxis   Solu-Medrol  [Methylprednisolone ] Anaphylaxis    Resolved after EpiPen  on 04/02/2023 (After Solu-medrol  250 mg iv, but no reaction after Solu-medrol  100 mg on 04/01/23 weird)   Past Medical History:  Diagnosis Date   Anemia    Depression    Hepatic steatosis 04/01/2023   Past Surgical History:  Procedure Laterality Date   ABDOMINAL HYSTERECTOMY     COLONOSCOPY WITH PROPOFOL  N/A 05/12/2022   Procedure: COLONOSCOPY WITH PROPOFOL ;  Surgeon: Jinny Carmine, MD;  Location: [facility] ENDOSCOPY;  Service: Endoscopy;  Laterality: N/A;   ESOPHAGOGASTRODUODENOSCOPY N/A 04/08/2023   Procedure: EGD (ESOPHAGOGASTRODUODENOSCOPY);  Surgeon: Toledo, Ladell POUR, MD;  Location: [facility] ENDOSCOPY;  Service: Gastroenterology;  Laterality: N/A;   ESOPHAGOGASTRODUODENOSCOPY (EGD) WITH PROPOFOL  N/A 05/12/2022   Procedure: ESOPHAGOGASTRODUODENOSCOPY (EGD) WITH PROPOFOL ;  Surgeon: Jinny Carmine, MD;  Location: [facility] ENDOSCOPY;  Service: Endoscopy;  Laterality: N/A;   IR BONE MARROW BIOPSY & ASPIRATION  05/28/2022   TONSILLECTOMY     Social History   Socioeconomic History   Marital status: Single    Spouse name: Not on file   Number of children: Not on file   Years of education: Not on file   Highest education level: Not on file  Occupational History   Not on file  Tobacco Use   Smoking status: Former    Current packs/day: 0.00    Average packs/day: 0.5 packs/day for 25.0 years (12.5 ttl pk-yrs)    Types: Cigarettes    Start date: 05/09/1997    Quit date: 05/10/2022    Years since quitting: 1.1   Smokeless tobacco: Never  Vaping Use   Vaping status: Never Used  Substance and Sexual Activity   Alcohol use: Not Currently   Drug use: Never   Sexual activity: Not Currently  Other Topics Concern   Not on file  Social History Narrative   Lives in [place] with 4 kids; stay at home [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    Social Drivers of Corporate investment banker Strain: Not on file  Food  Insecurity: Food Insecurity Present (06/18/2023)   [facility]   Hunger Vital Sign    Within the past 12 months, you worried that your food would run out before you got the money to buy more.: Sometimes true    Within the past 12 months, the food you bought just didn't last and you didn't have money to get more.: Sometimes true  Transportation Needs: No Transportation Needs (06/18/2023)   Received from [facility] - Transportation    In the past 12 months, has lack of transportation kept you from medical appointments or from getting medications?: No    Lack of Transportation (Non-Medical): No  Physical Activity: Not on file  Stress: Not on file  Social Connections: Unknown (04/01/2023)   Social Connection and Isolation Panel    Frequency of Communication with Friends and Family: More than three times a week    Frequency of Social Gatherings with Friends and Family: More than three times a week    Attends Religious Services: More than 4 times per year    Active Member of [employer] or Organizations: No    Attends Banker Meetings: Never    Marital Status: Patient declined  Intimate Partner Violence: Not At Risk (04/01/2023)   Humiliation, Afraid, Rape, and Kick questionnaire    Fear of Current or Ex-Partner: No  Emotionally Abused: No    Physically Abused: No    Sexually Abused: No   Family History  Problem Relation Age of Onset   Breast cancer Paternal Aunt     Current Outpatient Medications:    albuterol  (VENTOLIN  HFA) 108 (90 Base) MCG/ACT inhaler, Inhale 2 puffs into the lungs every 6 (six) hours as needed for wheezing or shortness of breath., Disp: 8 g, Rfl: 2   azathioprine  (IMURAN ) 100 MG tablet, Take 1 tablet (100 mg total) by mouth daily., Disp: 30 tablet, Rfl: 3   diphenoxylate -atropine  (LOMOTIL ) 2.5-0.025 MG tablet, Take 2 tablets by mouth 4 (four) times daily as needed for diarrhea or loose stools (diarrhea  unrelieved by imodium)., Disp: 60 tablet, Rfl: 0   famotidine  (PEPCID ) 20 MG tablet, Take 1 tablet (20 mg total) by mouth 2 (two) times daily., Disp: 60 tablet, Rfl: 0   folic acid  (FOLVITE ) 1 MG tablet, Take 1 tablet (1 mg total) by mouth daily., Disp: 90 tablet, Rfl: 1   fostamatinib  disodium (TAVALISSE ) 100 MG tablet, Take 1 tablet (100 mg total) by mouth 2 (two) times daily., Disp: 60 tablet, Rfl: 0   [Paused] metFORMIN (GLUCOPHAGE) 500 MG tablet, Take 500 mg by mouth 2 (two) times daily with a meal., Disp: , Rfl:    ondansetron  (ZOFRAN ) 8 MG tablet, Take 1 tablet (8 mg total) by mouth every 8 (eight) hours as needed for nausea or vomiting., Disp: 60 tablet, Rfl: 0   predniSONE  (DELTASONE ) 20 MG tablet, Once a day with food- in AM-  Prednisone  80 mg for 1 week; and then 60 mg-do not stop until recommended., Disp: 100 tablet, Rfl: 0   prochlorperazine  (COMPAZINE ) 10 MG tablet, Take 1 tablet (10 mg total) by mouth every 6 (six) hours as needed for refractory nausea / vomiting., Disp: 60 tablet, Rfl: 0   venlafaxine XR (EFFEXOR-XR) 37.5 MG 24 hr capsule, Take 37.5 mg by mouth daily., Disp: , Rfl:    pantoprazole  (PROTONIX ) 40 MG tablet, Take 1 tablet (40 mg total) by mouth daily. (Patient not taking: Reported on 07/20/2023), Disp: 30 tablet, Rfl: 1   [Paused] Semaglutide-Weight Management (WEGOVY) 0.25 MG/0.5ML SOAJ, Inject 0.25 mg into the skin once a week. (Patient not taking: Reported on 07/20/2023), Disp: , Rfl:   Physical exam:  Vitals:   07/20/23 1423  BP: (!) 92/59  Pulse: 98  Resp: 16  Temp: (!) 97.1 F (36.2 C)  TempSrc: Tympanic  SpO2: 100%  Weight: 207 lb (93.9 kg)   Physical Exam Vitals reviewed.  Constitutional:      General: She is not in acute distress.    Appearance: She is well-developed. She is ill-appearing.  HENT:     Mouth/Throat:     Pharynx: No oropharyngeal exudate or posterior oropharyngeal erythema.   Eyes:     General: No scleral icterus.     Conjunctiva/sclera: Conjunctivae normal.    Cardiovascular:     Rate and Rhythm: Normal rate and regular rhythm.  Pulmonary:     Effort: Pulmonary effort is normal.     Breath sounds: Normal breath sounds.  Abdominal:     General: Bowel sounds are normal. There is no distension.     Palpations: Abdomen is soft.     Tenderness: There is no abdominal tenderness.   Musculoskeletal:        General: No tenderness or deformity. Normal range of motion.     Cervical back: Normal range of motion and neck supple.   Skin:  General: Skin is warm and dry.     Coloration: Skin is not jaundiced or pale.   Neurological:     Mental Status: She is alert and oriented to person, place, and time.   Psychiatric:        Mood and Affect: Mood normal.        Behavior: Behavior normal.        Latest Ref Rng & Units 07/28/2023    9:35 AM  CMP  Total Protein 6.5 - 8.1 g/dL 6.5   Total Bilirubin 0.0 - 1.2 mg/dL 1.5   Alkaline Phos 38 - 126 U/L 95   AST 15 - 41 U/L 25   ALT 0 - 44 U/L 78       Latest Ref Rng & Units 07/20/2023    2:05 PM  CBC  WBC 4.0 - 10.5 K/uL 5.4   Hemoglobin 12.0 - 15.0 g/dL 8.0   Hematocrit 63.9 - 46.0 % 22.1   Platelets 150 - 400 K/uL 147    Component Ref Range & Units (hover) 8 d ago (07/20/23) 13 d ago (07/15/23) 3 wk ago (07/07/23) 4 wk ago (06/30/23) 1 mo ago (06/16/23) 1 mo ago (06/10/23) 1 mo ago (06/03/23)  LDH 637 High  671 High  CM 488 High  CM 343 High  CM 242 High  CM 202 High  CM 189 CM   No results found.  Assessment and plan- Patient is a 48 y.o. female    Nausea, vomiting, diarrhea-due to tavalisse . Hold. Supportive care today with IV fluids, antiemetics, antidiarrheals. Monitor over weekend to see if symptoms improve. If worsening despite holding tavalisse , recommend reevaluation.  Hypotension -due to fluid volume deficit.  IV fluids today. Hemolytic anemia-severe autoimmune hemolytic anemia.  DAT negative however, steroid responsive.  Grade 4  anaphylactic reaction to rituximab .  Currently on prednisone  80 mg/week.  Tavalisse  was previously held due to abnormal LFTs but was restarted on 07/15/2023.  However, symptoms concerning for side effects of medication and therefore recommend holding tavalisse .  However, LDH is persistently elevated at 637, hemoglobin 8.  She will need to continue prednisone  to try to minimize hemolysis.  Disposition:  Cancel 6/25 labs Change 7/2 appt to 7/1 and see me- la   Visit Diagnosis 1. Nausea and vomiting, unspecified vomiting type   2. Diarrhea, unspecified type    Patient expressed understanding and was in agreement with this plan. She also understands that She can call clinic at any time with any questions, concerns, or complaints.   Thank you for allowing me to participate in the care of this very pleasant patient.   Tinnie Dawn, DNP, AGNP-C, AOCNP [facility] [phone]  CC:

## 2023-07-22 ENCOUNTER — Inpatient Hospital Stay

## 2023-07-28 ENCOUNTER — Encounter: Payer: Self-pay | Admitting: Nurse Practitioner

## 2023-07-28 ENCOUNTER — Inpatient Hospital Stay: Admitting: Nurse Practitioner

## 2023-07-28 ENCOUNTER — Encounter: Payer: Self-pay | Admitting: Internal Medicine

## 2023-07-28 ENCOUNTER — Inpatient Hospital Stay: Attending: Internal Medicine

## 2023-07-28 VITALS — BP 108/64 | HR 87 | Temp 98.9°F | Resp 20 | Wt 214.6 lb

## 2023-07-28 DIAGNOSIS — D591 Autoimmune hemolytic anemia, unspecified: Secondary | ICD-10-CM

## 2023-07-28 DIAGNOSIS — R197 Diarrhea, unspecified: Secondary | ICD-10-CM | POA: Diagnosis not present

## 2023-07-28 DIAGNOSIS — D509 Iron deficiency anemia, unspecified: Secondary | ICD-10-CM | POA: Diagnosis not present

## 2023-07-28 DIAGNOSIS — T50905D Adverse effect of unspecified drugs, medicaments and biological substances, subsequent encounter: Secondary | ICD-10-CM

## 2023-07-28 LAB — SAMPLE TO BLOOD BANK

## 2023-07-28 LAB — CBC WITH DIFFERENTIAL (CANCER CENTER ONLY)
Abs Immature Granulocytes: 0.02 10*3/uL (ref 0.00–0.07)
Basophils Absolute: 0 10*3/uL (ref 0.0–0.1)
Basophils Relative: 0 %
Eosinophils Absolute: 0.1 10*3/uL (ref 0.0–0.5)
Eosinophils Relative: 2 %
HCT: 24.1 % — ABNORMAL LOW (ref 36.0–46.0)
Hemoglobin: 8.4 g/dL — ABNORMAL LOW (ref 12.0–15.0)
Immature Granulocytes: 1 %
Lymphocytes Relative: 36 %
Lymphs Abs: 1.3 10*3/uL (ref 0.7–4.0)
MCH: 39.6 pg — ABNORMAL HIGH (ref 26.0–34.0)
MCHC: 34.9 g/dL (ref 30.0–36.0)
MCV: 113.7 fL — ABNORMAL HIGH (ref 80.0–100.0)
Monocytes Absolute: 0.2 10*3/uL (ref 0.1–1.0)
Monocytes Relative: 5 %
Neutro Abs: 2.1 10*3/uL (ref 1.7–7.7)
Neutrophils Relative %: 56 %
Platelet Count: 172 10*3/uL (ref 150–400)
RBC: 2.12 MIL/uL — ABNORMAL LOW (ref 3.87–5.11)
RDW: 26.8 % — ABNORMAL HIGH (ref 11.5–15.5)
WBC Count: 3.7 10*3/uL — ABNORMAL LOW (ref 4.0–10.5)
nRBC: 0 % (ref 0.0–0.2)

## 2023-07-28 LAB — HEPATIC FUNCTION PANEL
ALT: 78 U/L — ABNORMAL HIGH (ref 0–44)
AST: 25 U/L (ref 15–41)
Albumin: 3.7 g/dL (ref 3.5–5.0)
Alkaline Phosphatase: 95 U/L (ref 38–126)
Bilirubin, Direct: 0.3 mg/dL — ABNORMAL HIGH (ref 0.0–0.2)
Indirect Bilirubin: 1.2 mg/dL — ABNORMAL HIGH (ref 0.3–0.9)
Total Bilirubin: 1.5 mg/dL — ABNORMAL HIGH (ref 0.0–1.2)
Total Protein: 6.5 g/dL (ref 6.5–8.1)

## 2023-07-28 LAB — LACTATE DEHYDROGENASE: LDH: 458 U/L — ABNORMAL HIGH (ref 98–192)

## 2023-07-28 NOTE — Progress Notes (Signed)
 Symptom Management Clinic  [facility] A Department of the [place]. [facility] [address], [address] [place], [place] [address] [phone] (phone) [phone] (fax)  Patient Care Team: Donal Channing SQUIBB, FNP as PCP - General (Family Medicine) Rennie Cindy SAUNDERS, MD as Consulting Physician (Oncology)   Name of the patient: Danielle [name]  [id number]  09-Nov-1975   Date of visit: 07/28/23  Diagnosis- Autoimmune hemolytic anemia  Chief complaint/ Reason for visit- diarrhea, vomiting  Heme/Onc History:   # IRON  DEFICIENCY ANEMIA CHRONIC [since 2019] AUG 2021- hb-9; MCV- 63; Iron  sat- 3%; EGD > 7 years ago [[place]]; colonoscopy/capsule-NA; PO iron  constipates.    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  JAK2 testing negative.  However bone marrow biopsy- [MAY 2024-negative for any myelophthisis process; suggestive of hemolysis] HOLD off metformin; and detromethomorphan- Bupropion. PNH testing-NEGATIVE; complement levels.;  Hemoglobin cascade; G6PD levels-WNL.  April 2024 abdominal ultrasound showed mild splenomegaly.     # 11/07/2022-RELAPSE-start  Rituximab  X1 [OCT 11th, 2024- ]G-4-anaphylactic reaction needing EpiPen  transporting to the emergency room.-Discontinue further rituximab . JAN 1st week, 2025- .500 mg BID; mycophenolate -DC:  Imuran + tavasslise [mid March 2025]    # MARCH 2025- OFF prednisone  [stopped/tapered OFF 10 mg a day] x2 months. Mycophenolate  500 mg twice daily-acute hemolytic anemia- complicated nstay in the hospital/ICU-anaphylactic reaction to IV Solu-Medrol -improved with EpiPen  although she tolerated Solu-Medrol  injection well earlier in the hospital stay.  Started on prednisone  60 to  80 mg a day- # Start Imuran  100 mg/day; # DC cellcept .    # History of heavy menstrual cycles   Interval history- patient is 48 year old female with above history of autoimmune hemolytic anemia who returns to clinic for follow-up.  She was seen on 07/20/2023 for nausea, vomiting, diarrhea.  Tavalisse  has been held.  She continues steroids.  Nausea and vomiting have resolved.  Her energy remains reduced but overall better. She's worried about gaining weight.   Review of systems- Review of Systems  Constitutional:  Positive for malaise/fatigue. Negative for chills, fever and weight loss.  HENT:  Negative for hearing loss, nosebleeds, sore throat and tinnitus.   Eyes:  Negative for blurred vision and double vision.  Respiratory:  Negative for cough, hemoptysis, shortness of breath and wheezing.   Cardiovascular:  Negative for chest pain, palpitations and leg swelling.  Gastrointestinal:  Negative for abdominal pain, blood in stool, constipation, diarrhea, melena, nausea and vomiting.  Genitourinary:  Negative for dysuria and urgency.  Musculoskeletal:  Negative for back pain, falls, joint pain and myalgias.  Skin:  Negative for itching and rash.  Neurological:  Negative for dizziness, tingling, sensory change, loss of consciousness, weakness and headaches.  Endo/Heme/Allergies:  Negative for environmental allergies. Does not bruise/bleed easily.  Psychiatric/Behavioral:  Negative for depression. The patient is not nervous/anxious and does not have insomnia.     Allergies  Allergen Reactions   Rituxan  [Rituximab ] Anaphylaxis   Solu-Medrol  [Methylprednisolone ] Anaphylaxis    Resolved after EpiPen  on 04/02/2023 (After Solu-medrol  250 mg iv, but no reaction after Solu-medrol  100 mg on 04/01/23 weird)   Past Medical History:  Diagnosis Date   Anemia    Depression    Hepatic steatosis 04/01/2023   Past Surgical History:  Procedure Laterality Date   ABDOMINAL HYSTERECTOMY  COLONOSCOPY WITH  PROPOFOL  N/A 05/12/2022   Procedure: COLONOSCOPY WITH PROPOFOL ;  Surgeon: Jinny Carmine, MD;  Location: [facility] ENDOSCOPY;  Service: Endoscopy;  Laterality: N/A;   ESOPHAGOGASTRODUODENOSCOPY N/A 04/08/2023   Procedure: EGD (ESOPHAGOGASTRODUODENOSCOPY);  Surgeon: Toledo, Ladell POUR, MD;  Location: [facility] ENDOSCOPY;  Service: Gastroenterology;  Laterality: N/A;   ESOPHAGOGASTRODUODENOSCOPY (EGD) WITH PROPOFOL  N/A 05/12/2022   Procedure: ESOPHAGOGASTRODUODENOSCOPY (EGD) WITH PROPOFOL ;  Surgeon: Jinny Carmine, MD;  Location: [facility] ENDOSCOPY;  Service: Endoscopy;  Laterality: N/A;   IR BONE MARROW BIOPSY & ASPIRATION  05/28/2022   TONSILLECTOMY     Social History   Socioeconomic History   Marital status: Single    Spouse name: Not on file   Number of children: Not on file   Years of education: Not on file   Highest education level: Not on file  Occupational History   Not on file  Tobacco Use   Smoking status: Former    Current packs/day: 0.00    Average packs/day: 0.5 packs/day for 25.0 years (12.5 ttl pk-yrs)    Types: Cigarettes    Start date: 05/09/1997    Quit date: 05/10/2022    Years since quitting: 1.2   Smokeless tobacco: Never  Vaping Use   Vaping status: Never Used  Substance and Sexual Activity   Alcohol use: Not Currently   Drug use: Never   Sexual activity: Not Currently  Other Topics Concern   Not on file  Social History Narrative   Lives in [place] with 4 kids; stay at home [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    Social Drivers of Corporate investment banker Strain: Not on file  Food Insecurity: Food Insecurity Present (06/18/2023)   Received from [facility]   Hunger Vital Sign    Within the past 12 months, you worried that your food would run out before you got the money to buy more.: Sometimes true    Within the past 12 months, the food you bought just didn't last and you didn't have money to get more.: Sometimes true  Transportation Needs:  No Transportation Needs (06/18/2023)   Received from [facility] - Transportation    In the past 12 months, has lack of transportation kept you from medical appointments or from getting medications?: No    Lack of Transportation (Non-Medical): No  Physical Activity: Not on file  Stress: Not on file  Social Connections: Unknown (04/01/2023)   Social Connection and Isolation Panel    Frequency of Communication with Friends and Family: More than three times a week    Frequency of Social Gatherings with Friends and Family: More than three times a week    Attends Religious Services: More than 4 times per year    Active Member of [employer] or Organizations: No    Attends Banker Meetings: Never    Marital Status: Patient declined  Catering manager Violence: Not At Risk (04/01/2023)   Humiliation, Afraid, Rape, and Kick questionnaire    Fear of Current or Ex-Partner: No    Emotionally Abused: No    Physically Abused: No    Sexually Abused: No   Family History  Problem Relation Age of Onset   Breast cancer Paternal Aunt    Current Outpatient Medications:    albuterol  (VENTOLIN  HFA) 108 (90 Base) MCG/ACT inhaler, Inhale 2 puffs into the lungs every 6 (six) hours as needed for wheezing or shortness of breath., Disp: 8 g, Rfl: 2  azathioprine  (IMURAN ) 100 MG tablet, Take 1 tablet (100 mg total) by mouth daily., Disp: 30 tablet, Rfl: 3   diphenoxylate -atropine  (LOMOTIL ) 2.5-0.025 MG tablet, Take 2 tablets by mouth 4 (four) times daily as needed for diarrhea or loose stools (diarrhea unrelieved by imodium)., Disp: 60 tablet, Rfl: 0   famotidine  (PEPCID ) 20 MG tablet, Take 1 tablet (20 mg total) by mouth 2 (two) times daily., Disp: 60 tablet, Rfl: 0   folic acid  (FOLVITE ) 1 MG tablet, Take 1 tablet (1 mg total) by mouth daily., Disp: 90 tablet, Rfl: 1   fostamatinib  disodium (TAVALISSE ) 100 MG tablet, Take 1 tablet (100 mg total) by mouth 2 (two) times daily., Disp:  60 tablet, Rfl: 0   [Paused] metFORMIN (GLUCOPHAGE) 500 MG tablet, Take 500 mg by mouth 2 (two) times daily with a meal., Disp: , Rfl:    ondansetron  (ZOFRAN ) 8 MG tablet, Take 1 tablet (8 mg total) by mouth every 8 (eight) hours as needed for nausea or vomiting., Disp: 60 tablet, Rfl: 0   pantoprazole  (PROTONIX ) 40 MG tablet, Take 1 tablet (40 mg total) by mouth daily. (Patient not taking: Reported on 07/20/2023), Disp: 30 tablet, Rfl: 1   predniSONE  (DELTASONE ) 20 MG tablet, Once a day with food- in AM-  Prednisone  80 mg for 1 week; and then 60 mg-do not stop until recommended., Disp: 100 tablet, Rfl: 0   prochlorperazine  (COMPAZINE ) 10 MG tablet, Take 1 tablet (10 mg total) by mouth every 6 (six) hours as needed for refractory nausea / vomiting., Disp: 60 tablet, Rfl: 0   [Paused] Semaglutide-Weight Management (WEGOVY) 0.25 MG/0.5ML SOAJ, Inject 0.25 mg into the skin once a week. (Patient not taking: Reported on 07/20/2023), Disp: , Rfl:    venlafaxine XR (EFFEXOR-XR) 37.5 MG 24 hr capsule, Take 37.5 mg by mouth daily., Disp: , Rfl:   Physical exam:  Vitals:   07/28/23 0955  BP: 108/64  Pulse: 87  Resp: 20  Temp: 98.9 F (37.2 C)  SpO2: 100%  Weight: 214 lb 9.6 oz (97.3 kg)    Physical Exam Constitutional:      General: She is not in acute distress.    Appearance: She is well-developed. She is not ill-appearing.  HENT:     Head: Atraumatic.     Nose: Nose normal.     Mouth/Throat:     Pharynx: No oropharyngeal exudate.   Eyes:     General: No scleral icterus.    Conjunctiva/sclera: Conjunctivae normal.    Cardiovascular:     Rate and Rhythm: Normal rate and regular rhythm.  Pulmonary:     Effort: Pulmonary effort is normal.     Breath sounds: Normal breath sounds.  Abdominal:     General: Bowel sounds are normal. There is no distension.     Palpations: Abdomen is soft.     Tenderness: There is no abdominal tenderness.   Musculoskeletal:        General: No tenderness or  deformity. Normal range of motion.     Cervical back: Normal range of motion and neck supple.   Skin:    General: Skin is warm and dry.     Coloration: Skin is not pale.   Neurological:     General: No focal deficit present.     Mental Status: She is alert and oriented to person, place, and time.   Psychiatric:        Mood and Affect: Mood normal.        Behavior:  Behavior normal.       Latest Ref Rng & Units 07/20/2023    2:05 PM  CMP  Total Protein 6.5 - 8.1 g/dL 6.8   Total Bilirubin 0.0 - 1.2 mg/dL 2.1   Alkaline Phos 38 - 126 U/L 71   AST 15 - 41 U/L 51   ALT 0 - 44 U/L 70       Latest Ref Rng & Units 07/28/2023    9:35 AM  CBC  WBC 4.0 - 10.5 K/uL 3.7   Hemoglobin 12.0 - 15.0 g/dL 8.4   Hematocrit 63.9 - 46.0 % 24.1   Platelets 150 - 400 K/uL 172    Component Ref Range & Units (hover) 09:35 (07/28/23) 8 d ago (07/20/23) 13 d ago (07/15/23) 3 wk ago (07/07/23) 4 wk ago (06/30/23) 1 mo ago (06/16/23) 1 mo ago (06/10/23)  LDH 458 High  637 High  CM 671 High  CM 488 High  CM 343 High  CM 242 High  CM 202 High      No results found.  Assessment and plan- Patient is a 48 y.o. female who presents to symptom management clinic for:    Nausea, vomiting, diarrhea- likely d/t tavalisse . Improved since stopping. Plan to discontinue.  Hypotension - Resolved.  Hemolytic anemia- hold tavalisse . LDH improving. Hmg 8.4. Improved. Continue steroids. Plan to check her counts weekly and follow up with Dr Rennie as scheduled.   Disposition:  F/u as scheduled- la  Visit Diagnosis 1. Autoimmune hemolytic anemia, unspecified (HCC)   2. Medication side effect, subsequent encounter     Patient expressed understanding and was in agreement with this plan. She also understands that She can call clinic at any time with any questions, concerns, or complaints.   Thank you for allowing me to participate in the care of this very pleasant patient.   Tinnie Dawn, DNP, AGNP-C,  AOCNP [facility] [phone]

## 2023-07-29 ENCOUNTER — Inpatient Hospital Stay: Attending: Internal Medicine

## 2023-07-29 ENCOUNTER — Other Ambulatory Visit: Payer: Self-pay

## 2023-08-05 ENCOUNTER — Other Ambulatory Visit: Payer: Self-pay

## 2023-08-05 ENCOUNTER — Inpatient Hospital Stay

## 2023-08-05 DIAGNOSIS — D591 Autoimmune hemolytic anemia, unspecified: Secondary | ICD-10-CM

## 2023-08-05 LAB — HEPATIC FUNCTION PANEL
ALT: 17 U/L (ref 0–44)
AST: 18 U/L (ref 15–41)
Albumin: 3.8 g/dL (ref 3.5–5.0)
Alkaline Phosphatase: 70 U/L (ref 38–126)
Bilirubin, Direct: 0.3 mg/dL — ABNORMAL HIGH (ref 0.0–0.2)
Indirect Bilirubin: 1.5 mg/dL — ABNORMAL HIGH (ref 0.3–0.9)
Total Bilirubin: 1.8 mg/dL — ABNORMAL HIGH (ref 0.0–1.2)
Total Protein: 6.5 g/dL (ref 6.5–8.1)

## 2023-08-05 LAB — CBC WITH DIFFERENTIAL (CANCER CENTER ONLY)
Abs Immature Granulocytes: 0.01 K/uL (ref 0.00–0.07)
Basophils Absolute: 0 K/uL (ref 0.0–0.1)
Basophils Relative: 0 %
Eosinophils Absolute: 0.1 K/uL (ref 0.0–0.5)
Eosinophils Relative: 2 %
HCT: 26.1 % — ABNORMAL LOW (ref 36.0–46.0)
Hemoglobin: 9.2 g/dL — ABNORMAL LOW (ref 12.0–15.0)
Immature Granulocytes: 0 %
Lymphocytes Relative: 34 %
Lymphs Abs: 1 K/uL (ref 0.7–4.0)
MCH: 40.7 pg — ABNORMAL HIGH (ref 26.0–34.0)
MCHC: 35.2 g/dL (ref 30.0–36.0)
MCV: 115.5 fL — ABNORMAL HIGH (ref 80.0–100.0)
Monocytes Absolute: 0.2 K/uL (ref 0.1–1.0)
Monocytes Relative: 6 %
Neutro Abs: 1.7 K/uL (ref 1.7–7.7)
Neutrophils Relative %: 58 %
Platelet Count: 220 K/uL (ref 150–400)
RBC: 2.26 MIL/uL — ABNORMAL LOW (ref 3.87–5.11)
RDW: 24.6 % — ABNORMAL HIGH (ref 11.5–15.5)
WBC Count: 3 K/uL — ABNORMAL LOW (ref 4.0–10.5)
nRBC: 0 % (ref 0.0–0.2)

## 2023-08-05 LAB — SAMPLE TO BLOOD BANK

## 2023-08-05 LAB — LACTATE DEHYDROGENASE: LDH: 311 U/L — ABNORMAL HIGH (ref 98–192)

## 2023-08-12 ENCOUNTER — Other Ambulatory Visit: Payer: Self-pay | Admitting: Internal Medicine

## 2023-08-12 DIAGNOSIS — D649 Anemia, unspecified: Secondary | ICD-10-CM

## 2023-08-18 ENCOUNTER — Telehealth: Payer: Self-pay | Admitting: *Deleted

## 2023-08-18 ENCOUNTER — Telehealth: Payer: Self-pay | Admitting: Internal Medicine

## 2023-08-18 NOTE — Telephone Encounter (Signed)
 Pt has a cold and needs to r/s appts for 7/23 & 7/24.  I tried to r/s pt to the next opening on 7/30. Pt stated she could not do an afternoon appt and it had to be a morning appt.  The next morning appt is 8/25. Pt stated she could not do that as she is to be seen every two weeks.  Pt requested appts to be canceled and the phone call to be sent to the nurse.   Appts canceled and phone call transferred to triage.

## 2023-08-18 NOTE — Telephone Encounter (Signed)
 The patient says that she has a cold and when I was talking to her she has sounds like a hoarseness to her voice and she says she does not want to go out anywhere because she thinks it is a cold but she is not sure.  The patient called to me stating that after Rennie wants him her to get labs on a regular standing .  The patient says that Dr. Rennie wants her labs every 2 weeks.  Heron is going to work on that and let her know

## 2023-08-19 ENCOUNTER — Inpatient Hospital Stay

## 2023-08-19 ENCOUNTER — Inpatient Hospital Stay: Admitting: Internal Medicine

## 2023-08-20 ENCOUNTER — Inpatient Hospital Stay

## 2023-08-21 ENCOUNTER — Other Ambulatory Visit (HOSPITAL_COMMUNITY): Payer: Self-pay

## 2023-08-21 ENCOUNTER — Other Ambulatory Visit: Payer: Self-pay

## 2023-08-24 ENCOUNTER — Other Ambulatory Visit: Payer: Self-pay

## 2023-08-24 ENCOUNTER — Other Ambulatory Visit: Payer: Self-pay | Admitting: *Deleted

## 2023-08-24 DIAGNOSIS — D649 Anemia, unspecified: Secondary | ICD-10-CM

## 2023-08-24 DIAGNOSIS — D591 Autoimmune hemolytic anemia, unspecified: Secondary | ICD-10-CM

## 2023-08-25 ENCOUNTER — Inpatient Hospital Stay

## 2023-08-25 ENCOUNTER — Other Ambulatory Visit: Payer: Self-pay

## 2023-08-25 ENCOUNTER — Inpatient Hospital Stay (HOSPITAL_BASED_OUTPATIENT_CLINIC_OR_DEPARTMENT_OTHER): Admitting: Nurse Practitioner

## 2023-08-25 ENCOUNTER — Encounter: Payer: Self-pay | Admitting: Nurse Practitioner

## 2023-08-25 VITALS — BP 114/65 | HR 93 | Temp 98.2°F | Resp 16 | Wt 205.0 lb

## 2023-08-25 DIAGNOSIS — D591 Autoimmune hemolytic anemia, unspecified: Secondary | ICD-10-CM | POA: Diagnosis not present

## 2023-08-25 DIAGNOSIS — D649 Anemia, unspecified: Secondary | ICD-10-CM

## 2023-08-25 LAB — CBC WITH DIFFERENTIAL (CANCER CENTER ONLY)
Abs Immature Granulocytes: 0.03 K/uL (ref 0.00–0.07)
Basophils Absolute: 0 K/uL (ref 0.0–0.1)
Basophils Relative: 0 %
Eosinophils Absolute: 0.1 K/uL (ref 0.0–0.5)
Eosinophils Relative: 2 %
HCT: 24.7 % — ABNORMAL LOW (ref 36.0–46.0)
Hemoglobin: 8.9 g/dL — ABNORMAL LOW (ref 12.0–15.0)
Immature Granulocytes: 1 %
Lymphocytes Relative: 28 %
Lymphs Abs: 1.3 K/uL (ref 0.7–4.0)
MCH: 40.5 pg — ABNORMAL HIGH (ref 26.0–34.0)
MCHC: 36 g/dL (ref 30.0–36.0)
MCV: 112.3 fL — ABNORMAL HIGH (ref 80.0–100.0)
Monocytes Absolute: 0.1 K/uL (ref 0.1–1.0)
Monocytes Relative: 2 %
Neutro Abs: 3.2 K/uL (ref 1.7–7.7)
Neutrophils Relative %: 67 %
Platelet Count: 174 K/uL (ref 150–400)
RBC: 2.2 MIL/uL — ABNORMAL LOW (ref 3.87–5.11)
RDW: 19.8 % — ABNORMAL HIGH (ref 11.5–15.5)
WBC Count: 4.7 K/uL (ref 4.0–10.5)
nRBC: 0 % (ref 0.0–0.2)

## 2023-08-25 LAB — SAMPLE TO BLOOD BANK

## 2023-08-25 LAB — CMP (CANCER CENTER ONLY)
ALT: 20 U/L (ref 0–44)
AST: 30 U/L (ref 15–41)
Albumin: 3.9 g/dL (ref 3.5–5.0)
Alkaline Phosphatase: 68 U/L (ref 38–126)
Anion gap: 6 (ref 5–15)
BUN: 12 mg/dL (ref 6–20)
CO2: 27 mmol/L (ref 22–32)
Calcium: 9 mg/dL (ref 8.9–10.3)
Chloride: 105 mmol/L (ref 98–111)
Creatinine: 0.62 mg/dL (ref 0.44–1.00)
GFR, Estimated: >60 mL/min (ref >60)
Glucose, Bld: 107 mg/dL — ABNORMAL HIGH (ref 70–99)
Potassium: 4.1 mmol/L (ref 3.5–5.1)
Sodium: 138 mmol/L (ref 135–145)
Total Bilirubin: 1.6 mg/dL — ABNORMAL HIGH (ref 0.0–1.2)
Total Protein: 6.9 g/dL (ref 6.5–8.1)

## 2023-08-25 LAB — RETIC PANEL
Immature Retic Fract: 28.9 % — ABNORMAL HIGH (ref 2.3–15.9)
RBC.: 2.16 MIL/uL — ABNORMAL LOW (ref 3.87–5.11)
Retic Count, Absolute: 54.4 K/uL (ref 19.0–186.0)
Retic Ct Pct: 2.5 % (ref 0.4–3.1)
Reticulocyte Hemoglobin: 47.5 pg (ref >27.9)

## 2023-08-25 LAB — LACTATE DEHYDROGENASE: LDH: 659 U/L — ABNORMAL HIGH (ref 98–192)

## 2023-08-25 NOTE — Progress Notes (Signed)
 Symptom Management Clinic  [facility] at [facility] A Department of the [place]. [facility] [address], [address] [place], [place] [address] [phone] (phone) [phone] (fax)  Patient Care Team: Donal Channing SQUIBB, FNP as PCP - General (Family Medicine) Rennie Cindy SAUNDERS, MD as Consulting Physician (Oncology)   Name of the patient: Danielle [name]  [id number]  1975-03-13   Date of visit: 08/25/23  Diagnosis- Autoimmune hemolytic anemia  Chief complaint/ Reason for visit- follow up  Heme/Onc History:   # IRON  DEFICIENCY ANEMIA CHRONIC [since 2019] AUG 2021- hb-9; MCV- 63; Iron  sat- 3%; EGD > 7 years ago [[place]]; colonoscopy/capsule-NA; PO iron  constipates.    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  JAK2 testing negative.  However bone marrow biopsy- [MAY 2024-negative for any myelophthisis process; suggestive of hemolysis] HOLD off metformin; and detromethomorphan- Bupropion. PNH testing-NEGATIVE; complement levels.;  Hemoglobin cascade; G6PD levels-WNL.  April 2024 abdominal ultrasound showed mild splenomegaly.     # 11/07/2022-RELAPSE-start  Rituximab  X1 [OCT 11th, 2024- ]G-4-anaphylactic reaction needing EpiPen  transporting to the emergency room.-Discontinue further rituximab . JAN 1st week, 2025- .500 mg BID; mycophenolate -DC:  Imuran + tavasslise [mid March 2025]    # MARCH 2025- OFF prednisone  [stopped/tapered OFF 10 mg a day] x2 months. Mycophenolate  500 mg twice daily-acute hemolytic anemia- complicated nstay in the hospital/ICU-anaphylactic reaction to IV Solu-Medrol -improved with EpiPen  although she tolerated Solu-Medrol  injection well earlier in the hospital stay.  Started on prednisone  60 to 80 mg a  day- # Start Imuran  100 mg/day; # DC cellcept .    # History of heavy menstrual cycles   Interval history- patient is 48 year old female with above history of autoimmune hemolytic anemia who returns to clinic for follow-up. Is tired. Stressed at home and with frequent appointments. She is worried about her counts being low.   Review of systems- Review of Systems  Constitutional:  Positive for malaise/fatigue. Negative for chills, fever and weight loss.  HENT:  Negative for hearing loss, nosebleeds, sore throat and tinnitus.   Eyes:  Negative for blurred vision and double vision.  Respiratory:  Negative for cough, hemoptysis, shortness of breath and wheezing.   Cardiovascular:  Negative for chest pain, palpitations and leg swelling.  Gastrointestinal:  Negative for abdominal pain, blood in stool, constipation, diarrhea, melena, nausea and vomiting.  Genitourinary:  Negative for dysuria and urgency.  Musculoskeletal:  Negative for back pain, falls, joint pain and myalgias.  Skin:  Negative for itching and rash.  Neurological:  Negative for dizziness, tingling, sensory change, loss of consciousness, weakness and headaches.  Endo/Heme/Allergies:  Negative for environmental allergies. Does not bruise/bleed easily.  Psychiatric/Behavioral:  Negative for depression. The patient is not nervous/anxious and does not have insomnia.     Allergies  Allergen Reactions   Rituxan  [Rituximab ] Anaphylaxis   Solu-Medrol  [Methylprednisolone ] Anaphylaxis    Resolved after EpiPen  on 04/02/2023 (After Solu-medrol  250 mg iv, but no reaction after Solu-medrol  100 mg on 04/01/23 weird)   Past Medical History:  Diagnosis Date   Anemia    Depression    Hepatic steatosis 04/01/2023   Past Surgical History:  Procedure Laterality Date   ABDOMINAL HYSTERECTOMY     COLONOSCOPY WITH PROPOFOL  N/A 05/12/2022   Procedure: COLONOSCOPY WITH PROPOFOL ;  Surgeon: Jinny Carmine, MD;  Location: [facility] ENDOSCOPY;  Service:  Endoscopy;  Laterality: N/A;   ESOPHAGOGASTRODUODENOSCOPY N/A 04/08/2023   Procedure: EGD (ESOPHAGOGASTRODUODENOSCOPY);  Surgeon: Toledo, Ladell POUR, MD;  Location: [facility] ENDOSCOPY;  Service: Gastroenterology;  Laterality: N/A;   ESOPHAGOGASTRODUODENOSCOPY (EGD) WITH PROPOFOL  N/A 05/12/2022   Procedure: ESOPHAGOGASTRODUODENOSCOPY (EGD) WITH PROPOFOL ;  Surgeon: Jinny Carmine, MD;  Location: [facility] ENDOSCOPY;  Service: Endoscopy;  Laterality: N/A;   IR BONE MARROW BIOPSY & ASPIRATION  05/28/2022   TONSILLECTOMY     Social History   Socioeconomic History   Marital status: Single    Spouse name: Not on file   Number of children: Not on file   Years of education: Not on file   Highest education level: Not on file  Occupational History   Not on file  Tobacco Use   Smoking status: Former    Current packs/day: 0.00    Average packs/day: 0.5 packs/day for 25.0 years (12.5 ttl pk-yrs)    Types: Cigarettes    Start date: 05/09/1997    Quit date: 05/10/2022    Years since quitting: 1.2   Smokeless tobacco: Never  Vaping Use   Vaping status: Never Used  Substance and Sexual Activity   Alcohol use: Not Currently   Drug use: Never   Sexual activity: Not Currently  Other Topics Concern   Not on file  Social History Narrative   Lives in [place] with 4 kids; stay at home [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    Social Drivers of Corporate investment banker Strain: Not on file  Food Insecurity: Food Insecurity Present (06/18/2023)   Received from [facility]   Hunger Vital Sign    Within the past 12 months, you worried that your food would run out before you got the money to buy more.: Sometimes true    Within the past 12 months, the food you bought just didn't last and you didn't have money to get more.: Sometimes true  Transportation Needs: No Transportation Needs (06/18/2023)   Received from [facility] - Transportation    In the past 12  months, has lack of transportation kept you from medical appointments or from getting medications?: No    Lack of Transportation (Non-Medical): No  Physical Activity: Not on file  Stress: Not on file  Social Connections: Unknown (04/01/2023)   Social Connection and Isolation Panel    Frequency of Communication with Friends and Family: More than three times a week    Frequency of Social Gatherings with Friends and Family: More than three times a week    Attends Religious Services: More than 4 times per year    Active Member of [employer] or Organizations: No    Attends Banker Meetings: Never    Marital Status: Patient declined  Catering manager Violence: Not At Risk (04/01/2023)   Humiliation, Afraid, Rape, and Kick questionnaire    Fear of Current or Ex-Partner: No    Emotionally Abused: No    Physically Abused: No    Sexually Abused: No   Family History  Problem Relation Age of Onset   Breast cancer Paternal Aunt    Current Outpatient Medications:    albuterol  (VENTOLIN  HFA) 108 (90 Base) MCG/ACT inhaler, Inhale 2 puffs into the lungs every 6 (six) hours as needed for wheezing or shortness of breath., Disp: 8 g, Rfl: 2   azathioprine  (IMURAN ) 100 MG tablet, Take 1 tablet (100 mg total) by mouth daily., Disp: 30 tablet, Rfl: 3  diphenoxylate -atropine  (LOMOTIL ) 2.5-0.025 MG tablet, Take 2 tablets by mouth 4 (four) times daily as needed for diarrhea or loose stools (diarrhea unrelieved by imodium)., Disp: 60 tablet, Rfl: 0   famotidine  (PEPCID ) 20 MG tablet, Take 1 tablet (20 mg total) by mouth 2 (two) times daily., Disp: 60 tablet, Rfl: 0   folic acid  (FOLVITE ) 1 MG tablet, Take 1 tablet (1 mg total) by mouth daily., Disp: 90 tablet, Rfl: 1   fostamatinib  disodium (TAVALISSE ) 100 MG tablet, Take 1 tablet (100 mg total) by mouth 2 (two) times daily. (Patient not taking: Reported on 08/25/2023), Disp: 60 tablet, Rfl: 0   [Paused] metFORMIN (GLUCOPHAGE) 500 MG tablet, Take 500 mg by  mouth 2 (two) times daily with a meal., Disp: , Rfl:    ondansetron  (ZOFRAN ) 8 MG tablet, Take 1 tablet (8 mg total) by mouth every 8 (eight) hours as needed for nausea or vomiting., Disp: 60 tablet, Rfl: 0   pantoprazole  (PROTONIX ) 40 MG tablet, Take 1 tablet (40 mg total) by mouth daily. (Patient not taking: Reported on 08/25/2023), Disp: 30 tablet, Rfl: 1   predniSONE  (DELTASONE ) 20 MG tablet, Once a day with food- in AM-  Prednisone  80 mg for 1 week; and then 60 mg-do not stop until recommended., Disp: 100 tablet, Rfl: 0   prochlorperazine  (COMPAZINE ) 10 MG tablet, Take 1 tablet (10 mg total) by mouth every 6 (six) hours as needed for refractory nausea / vomiting., Disp: 60 tablet, Rfl: 0   [Paused] Semaglutide-Weight Management (WEGOVY) 0.25 MG/0.5ML SOAJ, Inject 0.25 mg into the skin once a week. (Patient not taking: Reported on 08/25/2023), Disp: , Rfl:    venlafaxine XR (EFFEXOR-XR) 37.5 MG 24 hr capsule, Take 37.5 mg by mouth daily., Disp: , Rfl:   Physical exam:  Vitals:   08/25/23 0839  BP: 114/65  Pulse: 93  Resp: 16  Temp: 98.2 F (36.8 C)  TempSrc: Tympanic  SpO2: 100%  Weight: 205 lb (93 kg)    Physical Exam Vitals reviewed.  Constitutional:      General: She is not in acute distress.    Appearance: She is well-developed.     Comments: Fatigued appearing  Eyes:     General: No scleral icterus. Pulmonary:     Effort: No respiratory distress.  Abdominal:     General: There is no distension.     Palpations: Abdomen is soft.     Tenderness: There is no abdominal tenderness.  Musculoskeletal:        General: No tenderness.  Skin:    General: Skin is warm and dry.     Coloration: Skin is not pale.  Neurological:     Mental Status: She is alert and oriented to person, place, and time.  Psychiatric:        Mood and Affect: Mood normal.        Behavior: Behavior normal.       Latest Ref Rng & Units 08/25/2023    8:26 AM  CMP  Glucose 70 - 99 mg/dL 892   BUN 6 -  20 mg/dL 12   Creatinine 9.55 - 1.00 mg/dL 9.37   Sodium 864 - 854 mmol/L 138   Potassium 3.5 - 5.1 mmol/L 4.1   Chloride 98 - 111 mmol/L 105   CO2 22 - 32 mmol/L 27   Calcium 8.9 - 10.3 mg/dL 9.0   Total Protein 6.5 - 8.1 g/dL 6.9   Total Bilirubin 0.0 - 1.2 mg/dL 1.6   Alkaline Phos  38 - 126 U/L 68   AST 15 - 41 U/L 30   ALT 0 - 44 U/L 20       Latest Ref Rng & Units 08/25/2023    8:26 AM  CBC  WBC 4.0 - 10.5 K/uL 4.7   Hemoglobin 12.0 - 15.0 g/dL 8.9   Hematocrit 63.9 - 46.0 % 24.7   Platelets 150 - 400 K/uL 174    Component Ref Range & Units (hover) 08:26 (08/25/23) 2 wk ago (08/05/23) 4 wk ago (07/28/23) 1 mo ago (07/20/23) 1 mo ago (07/15/23) 1 mo ago (07/07/23) 1 mo ago (06/30/23)  LDH 659 High  311 High  CM 458 High  CM 637 High  CM 671 High  CM 488 High  CM 343 High    No results found.  Assessment and plan- Patient is a 49 y.o. female who presents to symptom management clinic for:    Hemolytic anemia- s/p tavalisse  d/t intolerance. LDH worse despite perdnisone 80 mg daily. Discussed with Dr Rennie who recommends continuing current dose. Check weekly labs and follow up with Dr Rennie in 3 weeks.  Nausea, vomiting, diarrhea- likely d/t tavalisse . Improved since stopping. Discontinued Hypotension - Resolved.   Disposition:  1 week- lab (cbc, ldh, hold tube)- D2 poss 1 pRBCs 2 week- lab (cbc, ldh, hold tube) D2 poss 1 pRBCs 3 week- lab (cbc, cmp, ldh, haptoglobin, hold tube, retic panel), Dr Rennie D2 poss 1 pRBC- la  Visit Diagnosis 1. Autoimmune hemolytic anemia, unspecified (HCC)     Patient expressed understanding and was in agreement with this plan. She also understands that She can call clinic at any time with any questions, concerns, or complaints.   Thank you for allowing me to participate in the care of this very pleasant patient.   Tinnie Dawn, DNP, AGNP-C, AOCNP [facility] [phone]

## 2023-08-26 ENCOUNTER — Other Ambulatory Visit: Payer: Self-pay

## 2023-08-26 ENCOUNTER — Inpatient Hospital Stay

## 2023-08-26 NOTE — Progress Notes (Signed)
 Per office visit on 7/29, medication was discontinued due to intolerance. Disenrolled

## 2023-08-27 LAB — HAPTOGLOBIN: Haptoglobin: <10 mg/dL — ABNORMAL LOW (ref 42–296)

## 2023-08-29 ENCOUNTER — Other Ambulatory Visit: Payer: Self-pay | Admitting: *Deleted

## 2023-08-29 DIAGNOSIS — D649 Anemia, unspecified: Secondary | ICD-10-CM

## 2023-09-01 ENCOUNTER — Inpatient Hospital Stay

## 2023-09-01 ENCOUNTER — Other Ambulatory Visit: Payer: Self-pay | Admitting: *Deleted

## 2023-09-01 ENCOUNTER — Inpatient Hospital Stay: Attending: Internal Medicine

## 2023-09-01 DIAGNOSIS — D472 Monoclonal gammopathy: Secondary | ICD-10-CM | POA: Insufficient documentation

## 2023-09-01 DIAGNOSIS — D649 Anemia, unspecified: Secondary | ICD-10-CM

## 2023-09-01 DIAGNOSIS — D591 Autoimmune hemolytic anemia, unspecified: Secondary | ICD-10-CM | POA: Diagnosis present

## 2023-09-01 DIAGNOSIS — D509 Iron deficiency anemia, unspecified: Secondary | ICD-10-CM | POA: Diagnosis not present

## 2023-09-01 LAB — LACTATE DEHYDROGENASE: LDH: 1348 U/L — ABNORMAL HIGH (ref 98–192)

## 2023-09-01 LAB — CBC WITH DIFFERENTIAL (CANCER CENTER ONLY)
Abs Immature Granulocytes: 0.02 K/uL (ref 0.00–0.07)
Basophils Absolute: 0 K/uL (ref 0.0–0.1)
Basophils Relative: 0 %
Eosinophils Absolute: 0.1 K/uL (ref 0.0–0.5)
Eosinophils Relative: 2 %
HCT: 21.5 % — ABNORMAL LOW (ref 36.0–46.0)
Hemoglobin: 7.8 g/dL — ABNORMAL LOW (ref 12.0–15.0)
Immature Granulocytes: 1 %
Lymphocytes Relative: 28 %
Lymphs Abs: 1.2 K/uL (ref 0.7–4.0)
MCH: 41.3 pg — ABNORMAL HIGH (ref 26.0–34.0)
MCHC: 36.3 g/dL — ABNORMAL HIGH (ref 30.0–36.0)
MCV: 113.8 fL — ABNORMAL HIGH (ref 80.0–100.0)
Monocytes Absolute: 0.1 K/uL (ref 0.1–1.0)
Monocytes Relative: 2 %
Neutro Abs: 3 K/uL (ref 1.7–7.7)
Neutrophils Relative %: 67 %
Platelet Count: 158 K/uL (ref 150–400)
RBC: 1.89 MIL/uL — ABNORMAL LOW (ref 3.87–5.11)
RDW: 21.3 % — ABNORMAL HIGH (ref 11.5–15.5)
WBC Count: 4.4 K/uL (ref 4.0–10.5)
nRBC: 0.5 % — ABNORMAL HIGH (ref 0.0–0.2)

## 2023-09-01 LAB — SAMPLE TO BLOOD BANK

## 2023-09-02 ENCOUNTER — Inpatient Hospital Stay

## 2023-09-02 DIAGNOSIS — D591 Autoimmune hemolytic anemia, unspecified: Secondary | ICD-10-CM

## 2023-09-02 DIAGNOSIS — D649 Anemia, unspecified: Secondary | ICD-10-CM

## 2023-09-02 LAB — PREPARE RBC (CROSSMATCH)

## 2023-09-02 MED ORDER — DIPHENHYDRAMINE HCL 25 MG PO CAPS
25.0000 mg | ORAL_CAPSULE | Freq: Once | ORAL | Status: AC
  Administered 2023-09-02: 25 mg via ORAL
  Filled 2023-09-02: qty 1

## 2023-09-02 MED ORDER — ACETAMINOPHEN 325 MG PO TABS
650.0000 mg | ORAL_TABLET | Freq: Once | ORAL | Status: AC
  Administered 2023-09-02: 650 mg via ORAL
  Filled 2023-09-02: qty 2

## 2023-09-02 MED ORDER — SODIUM CHLORIDE 0.9% IV SOLUTION
250.0000 mL | INTRAVENOUS | Status: DC
  Administered 2023-09-02: 100 mL via INTRAVENOUS
  Filled 2023-09-02: qty 250

## 2023-09-02 NOTE — Patient Instructions (Signed)

## 2023-09-03 ENCOUNTER — Encounter: Payer: Self-pay | Admitting: Internal Medicine

## 2023-09-03 LAB — TYPE AND SCREEN
ABO/RH(D): B POS
Antibody Screen: POSITIVE
Donor AG Type: NEGATIVE
Unit division: 0

## 2023-09-03 LAB — BPAM RBC
Blood Product Expiration Date: 202509062359
ISSUE DATE / TIME: 202508061217
Unit Type and Rh: 7300

## 2023-09-07 ENCOUNTER — Other Ambulatory Visit: Payer: Self-pay | Admitting: *Deleted

## 2023-09-07 DIAGNOSIS — D649 Anemia, unspecified: Secondary | ICD-10-CM

## 2023-09-08 ENCOUNTER — Inpatient Hospital Stay

## 2023-09-08 ENCOUNTER — Other Ambulatory Visit: Payer: Self-pay | Admitting: Nurse Practitioner

## 2023-09-08 DIAGNOSIS — D649 Anemia, unspecified: Secondary | ICD-10-CM

## 2023-09-08 DIAGNOSIS — D591 Autoimmune hemolytic anemia, unspecified: Secondary | ICD-10-CM | POA: Diagnosis not present

## 2023-09-08 LAB — CBC WITH DIFFERENTIAL (CANCER CENTER ONLY)
Abs Immature Granulocytes: 0.01 K/uL (ref 0.00–0.07)
Basophils Absolute: 0 K/uL (ref 0.0–0.1)
Basophils Relative: 0 %
Eosinophils Absolute: 0 K/uL (ref 0.0–0.5)
Eosinophils Relative: 2 %
HCT: 20.4 % — ABNORMAL LOW (ref 36.0–46.0)
Hemoglobin: 7.3 g/dL — ABNORMAL LOW (ref 12.0–15.0)
Immature Granulocytes: 0 %
Lymphocytes Relative: 39 %
Lymphs Abs: 1.1 K/uL (ref 0.7–4.0)
MCH: 38 pg — ABNORMAL HIGH (ref 26.0–34.0)
MCHC: 35.8 g/dL (ref 30.0–36.0)
MCV: 106.3 fL — ABNORMAL HIGH (ref 80.0–100.0)
Monocytes Absolute: 0.1 K/uL (ref 0.1–1.0)
Monocytes Relative: 2 %
Neutro Abs: 1.5 K/uL — ABNORMAL LOW (ref 1.7–7.7)
Neutrophils Relative %: 57 %
Platelet Count: 84 K/uL — ABNORMAL LOW (ref 150–400)
RBC: 1.92 MIL/uL — ABNORMAL LOW (ref 3.87–5.11)
RDW: 21.4 % — ABNORMAL HIGH (ref 11.5–15.5)
WBC Count: 2.7 K/uL — ABNORMAL LOW (ref 4.0–10.5)
nRBC: 0 % (ref 0.0–0.2)

## 2023-09-08 LAB — LACTATE DEHYDROGENASE: LDH: 1841 U/L — ABNORMAL HIGH (ref 98–192)

## 2023-09-08 LAB — SAMPLE TO BLOOD BANK

## 2023-09-08 MED ORDER — PREDNISONE 20 MG PO TABS: ORAL_TABLET | ORAL | 0 refills | Status: DC

## 2023-09-08 NOTE — Progress Notes (Signed)
 Dr Rennie recommends increasing prednisone  to 120 mg daily. Spoke to patient by phone. She is compliant with 80 mg. Plan to increase to 120. New prescription sent. Transfusion and follow up as planned.

## 2023-09-09 ENCOUNTER — Inpatient Hospital Stay

## 2023-09-09 ENCOUNTER — Other Ambulatory Visit: Payer: Self-pay | Admitting: *Deleted

## 2023-09-09 DIAGNOSIS — D649 Anemia, unspecified: Secondary | ICD-10-CM

## 2023-09-09 DIAGNOSIS — D591 Autoimmune hemolytic anemia, unspecified: Secondary | ICD-10-CM

## 2023-09-09 LAB — PREPARE RBC (CROSSMATCH)

## 2023-09-10 ENCOUNTER — Telehealth: Payer: Self-pay | Admitting: Internal Medicine

## 2023-09-10 ENCOUNTER — Inpatient Hospital Stay

## 2023-09-10 DIAGNOSIS — D591 Autoimmune hemolytic anemia, unspecified: Secondary | ICD-10-CM | POA: Diagnosis not present

## 2023-09-10 DIAGNOSIS — D649 Anemia, unspecified: Secondary | ICD-10-CM

## 2023-09-10 MED ORDER — DIPHENHYDRAMINE HCL 25 MG PO CAPS: 25.0000 mg | ORAL_CAPSULE | Freq: Once | ORAL | Status: DC

## 2023-09-10 MED ORDER — ACETAMINOPHEN 325 MG PO TABS
650.0000 mg | ORAL_TABLET | Freq: Once | ORAL | Status: AC
  Administered 2023-09-10: 650 mg via ORAL
  Filled 2023-09-10: qty 2

## 2023-09-10 MED ORDER — SODIUM CHLORIDE 0.9% IV SOLUTION
250.0000 mL | INTRAVENOUS | Status: DC
  Administered 2023-09-10: 250 mL via INTRAVENOUS
  Filled 2023-09-10: qty 250

## 2023-09-10 NOTE — Progress Notes (Signed)
 Pt reports chest pressure/tightness on exertion upon arrival. MD aware, Ok to proceed with blood transfusion and to be evaluated in ER upon discharge. At time of d/c, pt states she feels better and has more energy and declines ER evaluation at this time but will go to ER if pressure comes back or gets worse.

## 2023-09-10 NOTE — Telephone Encounter (Signed)
 Pt seen infor blood transfusion. Reports chest pain/tightness when she moves around and shortness of breath. Duration 2-3 days. c/o headaches, denies dizziness, nausea.

## 2023-09-10 NOTE — Telephone Encounter (Signed)
 Clinically symptoms of-worsening anemia.  Proceed with blood transfusion.  Patient will go to the emergency room for further evaluation-including possible need for more blood transfusions and also need for steroids IV.  Discussed with patient she is agreeable.

## 2023-09-11 LAB — TYPE AND SCREEN
ABO/RH(D): B POS
Antibody Screen: POSITIVE
Donor AG Type: NEGATIVE
Unit division: 0

## 2023-09-11 LAB — BPAM RBC
Blood Product Expiration Date: 202509142359
ISSUE DATE / TIME: 202508140848
Unit Type and Rh: 5100

## 2023-09-14 ENCOUNTER — Telehealth: Payer: Self-pay | Admitting: *Deleted

## 2023-09-14 ENCOUNTER — Inpatient Hospital Stay

## 2023-09-14 ENCOUNTER — Other Ambulatory Visit: Payer: Self-pay | Admitting: *Deleted

## 2023-09-14 DIAGNOSIS — D591 Autoimmune hemolytic anemia, unspecified: Secondary | ICD-10-CM

## 2023-09-14 DIAGNOSIS — D649 Anemia, unspecified: Secondary | ICD-10-CM

## 2023-09-14 LAB — CBC WITH DIFFERENTIAL (CANCER CENTER ONLY)
Abs Immature Granulocytes: 0.01 K/uL (ref 0.00–0.07)
Basophils Absolute: 0 K/uL (ref 0.0–0.1)
Basophils Relative: 0 %
Eosinophils Absolute: 0 K/uL (ref 0.0–0.5)
Eosinophils Relative: 1 %
HCT: 21.2 % — ABNORMAL LOW (ref 36.0–46.0)
Hemoglobin: 7.4 g/dL — ABNORMAL LOW (ref 12.0–15.0)
Immature Granulocytes: 0 %
Lymphocytes Relative: 36 %
Lymphs Abs: 1.1 K/uL (ref 0.7–4.0)
MCH: 34.7 pg — ABNORMAL HIGH (ref 26.0–34.0)
MCHC: 34.9 g/dL (ref 30.0–36.0)
MCV: 99.5 fL (ref 80.0–100.0)
Monocytes Absolute: 0.1 K/uL (ref 0.1–1.0)
Monocytes Relative: 3 %
Neutro Abs: 1.8 K/uL (ref 1.7–7.7)
Neutrophils Relative %: 60 %
Platelet Count: 66 K/uL — ABNORMAL LOW (ref 150–400)
RBC: 2.13 MIL/uL — ABNORMAL LOW (ref 3.87–5.11)
RDW: 23.5 % — ABNORMAL HIGH (ref 11.5–15.5)
WBC Count: 3.1 K/uL — ABNORMAL LOW (ref 4.0–10.5)
nRBC: 0.6 % — ABNORMAL HIGH (ref 0.0–0.2)

## 2023-09-14 LAB — CMP (CANCER CENTER ONLY)
ALT: 42 U/L (ref 0–44)
AST: 73 U/L — ABNORMAL HIGH (ref 15–41)
Albumin: 4.1 g/dL (ref 3.5–5.0)
Alkaline Phosphatase: 62 U/L (ref 38–126)
Anion gap: 9 (ref 5–15)
BUN: 16 mg/dL (ref 6–20)
CO2: 24 mmol/L (ref 22–32)
Calcium: 9.4 mg/dL (ref 8.9–10.3)
Chloride: 102 mmol/L (ref 98–111)
Creatinine: 0.56 mg/dL (ref 0.44–1.00)
GFR, Estimated: >60 mL/min (ref >60)
Glucose, Bld: 103 mg/dL — ABNORMAL HIGH (ref 70–99)
Potassium: 3.8 mmol/L (ref 3.5–5.1)
Sodium: 135 mmol/L (ref 135–145)
Total Bilirubin: 2 mg/dL — ABNORMAL HIGH (ref 0.0–1.2)
Total Protein: 7 g/dL (ref 6.5–8.1)

## 2023-09-14 LAB — LACTATE DEHYDROGENASE: LDH: >2500 U/L — ABNORMAL HIGH (ref 98–192)

## 2023-09-14 LAB — RETIC PANEL
Immature Retic Fract: 22.2 % — ABNORMAL HIGH (ref 2.3–15.9)
RBC.: 2.15 MIL/uL — ABNORMAL LOW (ref 3.87–5.11)
Retic Count, Absolute: 56.1 K/uL (ref 19.0–186.0)
Retic Ct Pct: 2.6 % (ref 0.4–3.1)
Reticulocyte Hemoglobin: 45.4 pg (ref >27.9)

## 2023-09-14 LAB — PREPARE RBC (CROSSMATCH)

## 2023-09-14 NOTE — Progress Notes (Signed)
 Called to the lab to assess patient. Complaints of SOB and chest pain/tightness with activity. Symptoms are resolving with rest per report. BP 127/76, HR 101, O2 sat 100% on RA. She reports she is scheduled for blood transfusion tomorrow. I have ask her to wait until labs result so Dr. Rennie can review. She is worried because he has previously requested her to go to the ED and she is reluctant.

## 2023-09-14 NOTE — Telephone Encounter (Signed)
 Critical lab called ; LDH > 2500. Readback. Dr B notified. Pt has dyspnea and chest discomfort when she walks and exerts herself. I went down to lab to talk with patient. She adamantly does not want to go to the ER. She states I am ok when I sit down and rest.I walked pt to the vehicle, her aunt is driving. Pt will return tomorrow for blood transfusion. MD is aware of above. Patient was instructed to call 911 for worsening symptoms. Concern as pt is actively hemolyzing RBC's.

## 2023-09-15 ENCOUNTER — Inpatient Hospital Stay

## 2023-09-15 DIAGNOSIS — D591 Autoimmune hemolytic anemia, unspecified: Secondary | ICD-10-CM

## 2023-09-15 DIAGNOSIS — D649 Anemia, unspecified: Secondary | ICD-10-CM

## 2023-09-15 LAB — HAPTOGLOBIN: Haptoglobin: <10 mg/dL — ABNORMAL LOW (ref 42–296)

## 2023-09-15 MED ORDER — DIPHENHYDRAMINE HCL 25 MG PO CAPS: 25.0000 mg | ORAL_CAPSULE | Freq: Once | ORAL | Status: DC

## 2023-09-15 MED ORDER — ACETAMINOPHEN 325 MG PO TABS
650.0000 mg | ORAL_TABLET | Freq: Once | ORAL | Status: AC
  Administered 2023-09-15: 650 mg via ORAL
  Filled 2023-09-15: qty 2

## 2023-09-15 MED ORDER — SODIUM CHLORIDE 0.9% IV SOLUTION
250.0000 mL | INTRAVENOUS | Status: DC
  Administered 2023-09-15: 100 mL via INTRAVENOUS
  Filled 2023-09-15: qty 250

## 2023-09-15 NOTE — Patient Instructions (Signed)
 Blood Transfusion, Adult A blood transfusion is a procedure in which you receive blood or a type of blood cell (blood component) through an IV. You may need a blood transfusion when you have a low blood count, which is a low number of any blood cell. This may result from a bleeding disorder, illness, injury, or surgery. The blood may come from a donor, or you may be able to have your own blood collected and stored (autologous blood donation) before a planned surgery. The blood given in a transfusion may be made up of different blood components. You may receive: Red blood cells. These carry oxygen to the cells in the body. Platelets. These help your blood to clot. Plasma. This is the liquid part of your blood. It carries proteins and other substances throughout the body. White blood cells. These help you fight infections. If you have hemophilia or another clotting disorder, you may also receive other types of blood products. Depending on the type of blood product, this procedure may take 1-4 hours to complete. Tell a health care provider about: Any bleeding problems you have. Any previous reactions you have had during a blood transfusion. Any allergies you have. All medicines you are taking, including vitamins, herbs, eye drops, creams, and over-the-counter medicines. Any surgeries you have had. Any medical conditions you have. Whether you are pregnant or may be pregnant. What are the risks? Talk with your health care provider about risks. The most common problems include: A mild allergic reaction, such as red, swollen areas of skin (hives) and itching. Fever or chills. This may be the body's response to new blood cells received. This may occur during or up to 4 hours after the transfusion. More serious problems may include: A serious allergic reaction that causes difficulty breathing or swelling around the face and lips. Transfusion-associated circulatory overload (TACO), or too much fluid in  the lungs. This may cause breathing problems. Transfusion-related acute lung injury (TRALI), which causes breathing difficulty and low oxygen in the blood. This can occur within hours of the transfusion or several days later. Iron overload. This can happen after receiving many blood transfusions over a period of time. Infection or virus being transmitted. This is rare because donated blood is carefully tested before it is given. Hemolytic transfusion reaction. This is rare. It happens when the body's defense system (immune system)tries to attack the new blood cells. Symptoms may include fever, chills, nausea, low blood pressure, and low back or chest pain. Transfusion-associated graft-versus-host disease (TAGVHD). This is rare. It happens when donated cells attack the body's healthy tissues. What happens before the procedure? You will have a blood test to check your blood type. This test is done to know what kind of blood your body will accept and to match it to the donor blood. If you are going to have a planned surgery, you may be able to do an autologous blood donation. This may be done in case you need to have a transfusion. You will have your temperature, blood pressure, and pulse checked before the transfusion. If you have had an allergic reaction to a transfusion in the past, you may be given medicine to help prevent a reaction. This medicine may be given to you by mouth (orally) or through an IV. What happens during the procedure?  An IV will be inserted into one of your veins. The bag of blood will be attached to your IV. The blood will then enter through your vein. Your temperature, blood pressure,  and pulse will be monitored during the transfusion. This monitoring is done to detect early signs of a transfusion reaction. Tell your nurse right away if you have any of these symptoms during the transfusion: Shortness of breath or trouble breathing. Chest or back pain. Fever or  chills. Itching or hives. If you have any signs or symptoms of a reaction, your transfusion will be stopped and you may be given medicine. When the transfusion is complete, your IV will be removed. Pressure may be applied to the IV site for a few minutes. A bandage (dressing)will be applied. The procedure may vary among health care providers and hospitals. What happens after the procedure? Your temperature, blood pressure, pulse, breathing rate, and blood oxygen level will be monitored until you leave the hospital or clinic. Your blood may be tested to see how you have responded to the transfusion. You may be warmed with fluids or blankets to maintain a normal body temperature. If you receive your blood transfusion in an outpatient setting, you will be told whom to contact to report any reactions. Where to find more information Visit the American Red Cross: redcross.org Summary A blood transfusion is a procedure in which you receive blood or a type of blood cell (blood component) through an IV. The blood given in a transfusion may be made up of different blood components. You may receive red blood cells, platelets, plasma, or white blood cells depending on the condition treated. Your temperature, blood pressure, and pulse will be monitored before, during, and after the transfusion. After the transfusion, your blood may be tested to see how your body has responded. This information is not intended to replace advice given to you by your health care provider. Make sure you discuss any questions you have with your health care provider. Document Revised: 04/12/2021 Document Reviewed: 04/12/2021 Elsevier Patient Education  2024 [employer].

## 2023-09-17 ENCOUNTER — Encounter: Payer: Self-pay | Admitting: Internal Medicine

## 2023-09-19 LAB — BPAM RBC
Blood Product Expiration Date: 202509262359
ISSUE DATE / TIME: 202508191142
Unit Type and Rh: 7300

## 2023-09-19 LAB — TYPE AND SCREEN
ABO/RH(D): B POS
Antibody Screen: POSITIVE
Donor AG Type: NEGATIVE
Unit division: 0

## 2023-09-23 ENCOUNTER — Inpatient Hospital Stay

## 2023-09-23 ENCOUNTER — Inpatient Hospital Stay (HOSPITAL_BASED_OUTPATIENT_CLINIC_OR_DEPARTMENT_OTHER): Admitting: Internal Medicine

## 2023-09-23 ENCOUNTER — Inpatient Hospital Stay: Admitting: Internal Medicine

## 2023-09-23 ENCOUNTER — Encounter: Payer: Self-pay | Admitting: Internal Medicine

## 2023-09-23 ENCOUNTER — Other Ambulatory Visit: Payer: Self-pay | Admitting: *Deleted

## 2023-09-23 VITALS — BP 91/47 | HR 88 | Temp 98.1°F | Resp 20 | Ht 63.0 in | Wt 204.3 lb

## 2023-09-23 DIAGNOSIS — D649 Anemia, unspecified: Secondary | ICD-10-CM

## 2023-09-23 DIAGNOSIS — D591 Autoimmune hemolytic anemia, unspecified: Secondary | ICD-10-CM | POA: Diagnosis not present

## 2023-09-23 LAB — CBC WITH DIFFERENTIAL (CANCER CENTER ONLY)
Abs Immature Granulocytes: 0.02 K/uL (ref 0.00–0.07)
Basophils Absolute: 0 K/uL (ref 0.0–0.1)
Basophils Relative: 0 %
Eosinophils Absolute: 0 K/uL (ref 0.0–0.5)
Eosinophils Relative: 2 %
HCT: 20.1 % — ABNORMAL LOW (ref 36.0–46.0)
Hemoglobin: 6.8 g/dL — CL (ref 12.0–15.0)
Immature Granulocytes: 1 %
Lymphocytes Relative: 48 %
Lymphs Abs: 1 K/uL (ref 0.7–4.0)
MCH: 31.5 pg (ref 26.0–34.0)
MCHC: 33.8 g/dL (ref 30.0–36.0)
MCV: 93.1 fL (ref 80.0–100.0)
Monocytes Absolute: 0 K/uL — ABNORMAL LOW (ref 0.1–1.0)
Monocytes Relative: 2 %
Neutro Abs: 1 K/uL — ABNORMAL LOW (ref 1.7–7.7)
Neutrophils Relative %: 47 %
Platelet Count: 67 K/uL — ABNORMAL LOW (ref 150–400)
RBC: 2.16 MIL/uL — ABNORMAL LOW (ref 3.87–5.11)
RDW: 21.1 % — ABNORMAL HIGH (ref 11.5–15.5)
WBC Count: 2 K/uL — ABNORMAL LOW (ref 4.0–10.5)
nRBC: 0 % (ref 0.0–0.2)

## 2023-09-23 LAB — RETIC PANEL
Immature Retic Fract: 13.2 % (ref 2.3–15.9)
RBC.: 2.14 MIL/uL — ABNORMAL LOW (ref 3.87–5.11)
Retic Count, Absolute: 31.5 K/uL (ref 19.0–186.0)
Retic Ct Pct: 1.5 % (ref 0.4–3.1)
Reticulocyte Hemoglobin: 43.1 pg (ref >27.9)

## 2023-09-23 LAB — LACTATE DEHYDROGENASE: LDH: >2500 U/L — ABNORMAL HIGH (ref 98–192)

## 2023-09-23 LAB — SAMPLE TO BLOOD BANK

## 2023-09-23 LAB — PREPARE RBC (CROSSMATCH)

## 2023-09-23 NOTE — Progress Notes (Signed)
 Critical lab called by [facility] in the lab; Hemoglobin is 6.8 today. Read back.  Dr Rennie notified.

## 2023-09-23 NOTE — Assessment & Plan Note (Addendum)
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 G-4 anaphylactic reaction to Rituximab . Discontinued Tavvalisse in June secondary to elevated LFTs. 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   Continue folic acid .  Discontinue Imuran .   # Continue prednisone  80 mg a day.-Patient has poor tolerance/reflux to higher doses.  # Discussed the need for escalating the therapy given her ongoing -refractoriness to steroids and intolerance to higher dose.  Patient has had reaction to multiple drugs including possibly IV Solu-Medrol .  Recommend infusions like Solu-Medrol /Cytoxan in the hospital.  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   # re: heart burn-recommend Prilosec or Nexium over the counter- in AM prior to breakfast. 60 minutes prior to meal.  Sitting upright for 2 to 3 hours post a meal.  Also. Recommend TUMS [over the counter]  # DISPOSITION: #  Blood tomorrow # follow up next week- Tuesday/Thusrday APP- labs- cbc/cmp-LDH; retic count- D-2 1 unit of blood- .B

## 2023-09-23 NOTE — Progress Notes (Signed)
 Fatigue/weakness: YES Dyspena: YES Light headedness: YES, x1.5 weeks tylenol  not helping. She's not sure if she can take anti inflammatories. C/o blurred vision. Blood in stool: YES, RED, constipation, miralax . Sometimes only one BM per week, some weeks not at all.   Needs refill azathioprine , pended.  C/o when she swallows, it feels like something is stabbing her in the throat. Having chest tightness when tired.

## 2023-09-23 NOTE — Progress Notes (Signed)
 [facility] CONSULT NOTE  Patient Care Team: Donal Channing SQUIBB, FNP as PCP - General (Family Medicine) Rennie Cindy SAUNDERS, MD as Consulting Physician (Oncology)  CHIEF COMPLAINTS/PURPOSE OF CONSULTATION: ANEMIA  HEMATOLOGY HISTORY  # IRON  DEFICIENCY ANEMIA CHRONIC [since 2019] AUG 2021- hb-9; MCV- 63; Iron  sat- 3%; EGD > 7 years ago [[place]]; colonoscopy/capsule-NA; PO iron  constipates.   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  JAK2 testing negative.  However bone marrow biopsy- [MAY 2024-negative for any myelophthisis process; suggestive of hemolysis] HOLD off metformin; and detromethomorphan- Bupropion. PNH testing-NEGATIVE; complement levels.;  Hemoglobin cascade; G6PD levels-WNL.  April 2024 abdominal ultrasound showed mild splenomegaly.    # 11/07/2022-RELAPSE-start  Rituximab  X1 [OCT 11th, 2024- ]G-4-anaphylactic reaction needing EpiPen  transporting to the emergency room.-Discontinue further rituximab . JAN 1st week, 2025- .500 mg BID; mycophenolate -DC:  Imuran + tavasslise [mid March 2025]   # MARCH 2025- OFF prednisone  [ stopped/tapered OFF 10 mg a day] x2 months.  mycophenolate  500 mg twice daily-acute hemolytic anemia- complicated nstay in the hospital/ICU-anaphylactic reaction to IV Solu-Medrol -improved with EpiPen  although she tolerated Solu-Medrol  injection well earlier in the hospital stay.  Started on prednisone  60 to 80 mg a day- # Start Imuran  100 mg/day; # DC cellcept .   #History of heavy menstrual cycles  HISTORY OF PRESENTING ILLNESS: Alone. Ambulating.  Danielle [name] 48 y.o.  female with relapsed autoimmune hemolytic anemia steroid responsive -but DAT negative [ [suspect IgA] currently oral steroids + Imuran +  is here for  follow-up.  Complaints feeling poorly.  Extreme fatigue.  Shortness of breath on exertion.  Headaches.  Intermittent blood in stools because of constipation.  Complains of reflux-like symptoms with high-dose of steroids.  Review of Systems  Constitutional:  Negative for chills, diaphoresis, fever and weight loss.  HENT:  Negative for nosebleeds and sore throat.   Eyes:  Negative for double vision.  Respiratory:  Negative for hemoptysis and sputum production.   Cardiovascular:  Negative for chest pain, palpitations and orthopnea.  Gastrointestinal:  Positive for blood in stool and constipation. Negative for abdominal pain, diarrhea, heartburn and melena.  Genitourinary:  Negative for dysuria, frequency and urgency.  Musculoskeletal:  Negative for back pain and joint pain.  Skin: Negative.  Negative for itching and rash.  Neurological:  Positive for dizziness, weakness and headaches. Negative for focal weakness.  Endo/Heme/Allergies:  Does not bruise/bleed easily.  Psychiatric/Behavioral:  Negative for depression. The patient is not nervous/anxious.     MEDICAL HISTORY:  Past Medical History:  Diagnosis Date   Anemia    Depression    Hepatic steatosis 04/01/2023    SURGICAL HISTORY: Past Surgical History:  Procedure Laterality Date   ABDOMINAL HYSTERECTOMY     COLONOSCOPY WITH PROPOFOL  N/A 05/12/2022   Procedure: COLONOSCOPY WITH PROPOFOL ;  Surgeon: Jinny Carmine, MD;  Location: [facility] ENDOSCOPY;  Service: Endoscopy;  Laterality: N/A;   ESOPHAGOGASTRODUODENOSCOPY N/A 04/08/2023   Procedure: EGD (ESOPHAGOGASTRODUODENOSCOPY);  Surgeon: Toledo, Ladell POUR, MD;  Location: [facility] ENDOSCOPY;  Service: Gastroenterology;  Laterality: N/A;   ESOPHAGOGASTRODUODENOSCOPY (EGD) WITH PROPOFOL  N/A 05/12/2022   Procedure: ESOPHAGOGASTRODUODENOSCOPY (EGD) WITH PROPOFOL ;  Surgeon: Jinny Carmine, MD;  Location: [facility] ENDOSCOPY;  Service: Endoscopy;  Laterality: N/A;   IR BONE MARROW BIOPSY & ASPIRATION   05/28/2022   TONSILLECTOMY      SOCIAL HISTORY: Social  History   Socioeconomic History   Marital status: Single    Spouse name: Not on file   Number of children: Not on file   Years of education: Not on file   Highest education level: Not on file  Occupational History   Not on file  Tobacco Use   Smoking status: Former    Current packs/day: 0.00    Average packs/day: 0.5 packs/day for 25.0 years (12.5 ttl pk-yrs)    Types: Cigarettes    Start date: 05/09/1997    Quit date: 05/10/2022    Years since quitting: 1.3   Smokeless tobacco: Never  Vaping Use   Vaping status: Never Used  Substance and Sexual Activity   Alcohol use: Not Currently   Drug use: Never   Sexual activity: Not Currently  Other Topics Concern   Not on file  Social History Narrative   Lives in [place] with 4 kids; stay at home [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    Social Drivers of Corporate investment banker Strain: Not on file  Food Insecurity: Food Insecurity Present (06/18/2023)   Received from [facility]   Hunger Vital Sign    Within the past 12 months, you worried that your food would run out before you got the money to buy more.: Sometimes true    Within the past 12 months, the food you bought just didn't last and you didn't have money to get more.: Sometimes true  Transportation Needs: No Transportation Needs (06/18/2023)   Received from [facility] - Transportation    In the past 12 months, has lack of transportation kept you from medical appointments or from getting medications?: No    Lack of Transportation (Non-Medical): No  Physical Activity: Not on file  Stress: Not on file  Social Connections: Unknown (04/01/2023)   Social Connection and Isolation Panel    Frequency of Communication with Friends and Family: More than three times a week    Frequency of Social Gatherings with Friends and Family: More than three times a week     Attends Religious Services: More than 4 times per year    Active Member of [employer] or Organizations: No    Attends Banker Meetings: Never    Marital Status: Patient declined  Catering manager Violence: Not At Risk (04/01/2023)   Humiliation, Afraid, Rape, and Kick questionnaire    Fear of Current or Ex-Partner: No    Emotionally Abused: No    Physically Abused: No    Sexually Abused: No    FAMILY HISTORY: Family History  Problem Relation Age of Onset   Breast cancer Paternal Aunt     ALLERGIES:  is allergic to rituxan  [rituximab ] and solu-medrol  [methylprednisolone ].  MEDICATIONS:  Current Outpatient Medications  Medication Sig Dispense Refill   albuterol  (VENTOLIN  HFA) 108 (90 Base) MCG/ACT inhaler Inhale 2 puffs into the lungs every 6 (six) hours as needed for wheezing or shortness of breath. 8 g 2   azathioprine  (IMURAN ) 100 MG tablet Take 1 tablet (100 mg total) by mouth daily. 30 tablet 3   diphenoxylate -atropine  (LOMOTIL ) 2.5-0.025 MG tablet Take 2 tablets by mouth 4 (four) times daily as needed for diarrhea or loose stools (diarrhea unrelieved by imodium). 60 tablet 0   folic acid  (FOLVITE ) 1 MG tablet Take 1 tablet (1 mg total) by mouth daily. 90 tablet 1   predniSONE  (DELTASONE ) 20 MG tablet Do not stop until recommended. (Patient taking  differently: Take 80 mg by mouth daily with breakfast. Do not stop until recommended.) 100 tablet 0   Semaglutide-Weight Management (WEGOVY) 0.25 MG/0.5ML SOAJ Inject 0.25 mg into the skin once a week.     venlafaxine XR (EFFEXOR-XR) 37.5 MG 24 hr capsule Take 37.5 mg by mouth daily.     No current facility-administered medications for this visit.      PHYSICAL EXAMINATION:   Vitals:   09/23/23 1028  BP: (!) 91/47  Pulse: 88  Resp: 20  Temp: 98.1 F (36.7 C)  SpO2: 100%         Filed Weights   09/23/23 1028  Weight: 204 lb 4.8 oz (92.7 kg)         Physical Exam HENT:     Head: Normocephalic and  atraumatic.     Mouth/Throat:     Pharynx: No oropharyngeal exudate.  Eyes:     Pupils: Pupils are equal, round, and reactive to light.  Cardiovascular:     Rate and Rhythm: Normal rate and regular rhythm.  Pulmonary:     Effort: Pulmonary effort is normal. No respiratory distress.     Breath sounds: Normal breath sounds. No wheezing.  Abdominal:     General: Bowel sounds are normal. There is no distension.     Palpations: Abdomen is soft. There is no mass.     Tenderness: There is no abdominal tenderness. There is no guarding or rebound.  Musculoskeletal:        General: No tenderness. Normal range of motion.     Cervical back: Normal range of motion and neck supple.  Skin:    General: Skin is warm.  Neurological:     Mental Status: She is alert and oriented to person, place, and time.  Psychiatric:        Mood and Affect: Affect normal.     LABORATORY DATA:  I have reviewed the data as listed Lab Results  Component Value Date   WBC 2.0 (L) 09/23/2023   HGB 6.8 (LL) 09/23/2023   HCT 20.1 (L) 09/23/2023   MCV 93.1 09/23/2023   PLT 67 (L) 09/23/2023   Recent Labs    07/15/23 0845 07/20/23 1405 07/28/23 0935 08/05/23 0846 08/25/23 0826 09/14/23 1334  NA 140  --   --   --  138 135  K 4.1  --   --   --  4.1 3.8  CL 106  --   --   --  105 102  CO2 26  --   --   --  27 24  GLUCOSE 108*  --   --   --  107* 103*  BUN 11  --   --   --  12 16  CREATININE 0.47  --   --   --  0.62 0.56  CALCIUM 8.9  --   --   --  9.0 9.4  GFRNONAA >60  --   --   --  >60 >60  PROT 6.8  6.7 6.8 6.5 6.5 6.9 7.0  ALBUMIN 3.8  3.8 4.0 3.7 3.8 3.9 4.1  AST 31  32 51* 25 18 30  73*  ALT 22  23 70* 78* 17 20 42  ALKPHOS 61  60 71 95 70 68 62  BILITOT 2.2*  2.1* 2.1* 1.5* 1.8* 1.6* 2.0*  BILIDIR 0.3* 0.4* 0.3* 0.3*  --   --   IBILI 1.9* 1.7* 1.2* 1.5*  --   --  No results found.   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 G-4 anaphylactic reaction to Rituximab . Discontinued Tavvalisse in June secondary to elevated LFTs. 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   Continue folic acid .  Discontinue Imuran .   # Continue prednisone  80 mg a day.-Patient has poor tolerance/reflux to higher doses.  # Discussed the need for escalating the therapy given her ongoing -refractoriness to steroids and intolerance to higher dose.  Patient has had reaction to multiple drugs including possibly IV Solu-Medrol .  Recommend infusions like Solu-Medrol /Cytoxan in the hospital.  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   # re: heart burn-recommend Prilosec or Nexium over the counter- in AM prior to breakfast. 60 minutes prior to meal.  Sitting upright for 2 to 3 hours post a meal.  Also. Recommend TUMS [over the counter]  # DISPOSITION: #  Blood tomorrow # follow up next week- Tuesday/Thusrday APP- labs- cbc/cmp-LDH; retic count- D-2 1 unit of blood- .B     All questions were answered. The patient knows to call the clinic with any problems, questions or concerns.    Cindy JONELLE Joe, MD 09/23/2023 12:23 PM

## 2023-09-23 NOTE — Patient Instructions (Signed)
 Recommend TWICE A DAY- Prilosec or Nexium over the counter- in AM prior to breakfast. 60 minutes prior to meal.  Sitting upright for 2 to 3 hours post a meal.  Also. Recommend TUMS [over the counter]

## 2023-09-24 ENCOUNTER — Inpatient Hospital Stay

## 2023-09-24 DIAGNOSIS — D591 Autoimmune hemolytic anemia, unspecified: Secondary | ICD-10-CM

## 2023-09-24 DIAGNOSIS — D649 Anemia, unspecified: Secondary | ICD-10-CM

## 2023-09-24 LAB — HAPTOGLOBIN: Haptoglobin: <10 mg/dL — ABNORMAL LOW (ref 42–296)

## 2023-09-24 MED ORDER — ACETAMINOPHEN 325 MG PO TABS
650.0000 mg | ORAL_TABLET | Freq: Once | ORAL | Status: AC
  Administered 2023-09-24: 650 mg via ORAL
  Filled 2023-09-24: qty 2

## 2023-09-24 MED ORDER — SODIUM CHLORIDE 0.9% IV SOLUTION
250.0000 mL | INTRAVENOUS | Status: DC
  Administered 2023-09-24: 100 mL via INTRAVENOUS
  Filled 2023-09-24: qty 250

## 2023-09-24 MED ORDER — DIPHENHYDRAMINE HCL 25 MG PO CAPS: 25.0000 mg | ORAL_CAPSULE | Freq: Once | ORAL | Status: DC

## 2023-09-25 LAB — BPAM RBC
Blood Product Expiration Date: 202509242359
ISSUE DATE / TIME: 202508281048
PRODUCT CODE: 202509242359
Unit Type and Rh: 7300
Unit Type and Rh: 7300

## 2023-09-25 LAB — TYPE AND SCREEN
ABO/RH(D): B POS
Antibody Screen: POSITIVE
Donor AG Type: NEGATIVE
Unit division: 0

## 2023-09-29 ENCOUNTER — Inpatient Hospital Stay

## 2023-09-29 ENCOUNTER — Encounter: Payer: Self-pay | Admitting: Internal Medicine

## 2023-09-29 ENCOUNTER — Inpatient Hospital Stay: Admitting: Nurse Practitioner

## 2023-09-30 ENCOUNTER — Inpatient Hospital Stay: Attending: Internal Medicine

## 2023-09-30 ENCOUNTER — Encounter: Payer: Self-pay | Admitting: *Deleted

## 2023-09-30 ENCOUNTER — Other Ambulatory Visit: Payer: Self-pay | Admitting: *Deleted

## 2023-09-30 DIAGNOSIS — D591 Autoimmune hemolytic anemia, unspecified: Secondary | ICD-10-CM | POA: Diagnosis present

## 2023-09-30 DIAGNOSIS — Z79899 Other long term (current) drug therapy: Secondary | ICD-10-CM | POA: Diagnosis not present

## 2023-09-30 DIAGNOSIS — D649 Anemia, unspecified: Secondary | ICD-10-CM

## 2023-09-30 LAB — CBC WITH DIFFERENTIAL (CANCER CENTER ONLY)
Abs Immature Granulocytes: 0.01 K/uL (ref 0.00–0.07)
Basophils Absolute: 0 K/uL (ref 0.0–0.1)
Basophils Relative: 0 %
Eosinophils Absolute: 0 K/uL (ref 0.0–0.5)
Eosinophils Relative: 2 %
HCT: 22.6 % — ABNORMAL LOW (ref 36.0–46.0)
Hemoglobin: 7.7 g/dL — ABNORMAL LOW (ref 12.0–15.0)
Immature Granulocytes: 1 %
Lymphocytes Relative: 43 %
Lymphs Abs: 0.9 K/uL (ref 0.7–4.0)
MCH: 31 pg (ref 26.0–34.0)
MCHC: 34.1 g/dL (ref 30.0–36.0)
MCV: 91.1 fL (ref 80.0–100.0)
Monocytes Absolute: 0.1 K/uL (ref 0.1–1.0)
Monocytes Relative: 3 %
Neutro Abs: 1 K/uL — ABNORMAL LOW (ref 1.7–7.7)
Neutrophils Relative %: 51 %
Platelet Count: 61 K/uL — ABNORMAL LOW (ref 150–400)
RBC: 2.48 MIL/uL — ABNORMAL LOW (ref 3.87–5.11)
RDW: 18.6 % — ABNORMAL HIGH (ref 11.5–15.5)
WBC Count: 2 K/uL — ABNORMAL LOW (ref 4.0–10.5)
nRBC: 0 % (ref 0.0–0.2)

## 2023-09-30 LAB — CMP (CANCER CENTER ONLY)
ALT: 56 U/L — ABNORMAL HIGH (ref 0–44)
AST: 86 U/L — ABNORMAL HIGH (ref 15–41)
Albumin: 4.2 g/dL (ref 3.5–5.0)
Alkaline Phosphatase: 54 U/L (ref 38–126)
Anion gap: 10 (ref 5–15)
BUN: 15 mg/dL (ref 6–20)
CO2: 26 mmol/L (ref 22–32)
Calcium: 9.3 mg/dL (ref 8.9–10.3)
Chloride: 100 mmol/L (ref 98–111)
Creatinine: 0.52 mg/dL (ref 0.44–1.00)
GFR, Estimated: >60 mL/min (ref >60)
Glucose, Bld: 106 mg/dL — ABNORMAL HIGH (ref 70–99)
Potassium: 4.2 mmol/L (ref 3.5–5.1)
Sodium: 136 mmol/L (ref 135–145)
Total Bilirubin: 2 mg/dL — ABNORMAL HIGH (ref 0.0–1.2)
Total Protein: 7 g/dL (ref 6.5–8.1)

## 2023-09-30 LAB — LACTATE DEHYDROGENASE: LDH: >2500 U/L — ABNORMAL HIGH (ref 98–192)

## 2023-09-30 LAB — RETIC PANEL
Immature Retic Fract: 11.2 % (ref 2.3–15.9)
RBC.: 2.5 MIL/uL — ABNORMAL LOW (ref 3.87–5.11)
Retic Count, Absolute: 32.5 K/uL (ref 19.0–186.0)
Retic Ct Pct: 1.3 % (ref 0.4–3.1)
Reticulocyte Hemoglobin: 43.3 pg (ref >27.9)

## 2023-09-30 LAB — PREPARE RBC (CROSSMATCH)

## 2023-10-01 ENCOUNTER — Other Ambulatory Visit

## 2023-10-01 ENCOUNTER — Inpatient Hospital Stay: Admitting: Nurse Practitioner

## 2023-10-01 ENCOUNTER — Inpatient Hospital Stay

## 2023-10-01 DIAGNOSIS — D591 Autoimmune hemolytic anemia, unspecified: Secondary | ICD-10-CM

## 2023-10-01 DIAGNOSIS — D649 Anemia, unspecified: Secondary | ICD-10-CM

## 2023-10-01 MED ORDER — SODIUM CHLORIDE 0.9% IV SOLUTION
250.0000 mL | INTRAVENOUS | Status: DC
  Administered 2023-10-01: 100 mL via INTRAVENOUS
  Filled 2023-10-01: qty 250

## 2023-10-01 MED ORDER — ACETAMINOPHEN 325 MG PO TABS
650.0000 mg | ORAL_TABLET | Freq: Once | ORAL | Status: AC
  Administered 2023-10-01: 650 mg via ORAL
  Filled 2023-10-01: qty 2

## 2023-10-01 MED ORDER — DIPHENHYDRAMINE HCL 25 MG PO CAPS: 25.0000 mg | ORAL_CAPSULE | Freq: Once | ORAL | Status: DC

## 2023-10-01 NOTE — Patient Instructions (Signed)

## 2023-10-02 LAB — TYPE AND SCREEN
ABO/RH(D): B POS
Antibody Screen: POSITIVE
Donor AG Type: NEGATIVE
Unit division: 0

## 2023-10-02 LAB — BPAM RBC
Blood Product Expiration Date: 202509282359
ISSUE DATE / TIME: 202509041010
Unit Type and Rh: 202509282359
Unit Type and Rh: 7300

## 2023-10-06 ENCOUNTER — Other Ambulatory Visit: Payer: Self-pay | Admitting: *Deleted

## 2023-10-06 DIAGNOSIS — D591 Autoimmune hemolytic anemia, unspecified: Secondary | ICD-10-CM

## 2023-10-06 DIAGNOSIS — D649 Anemia, unspecified: Secondary | ICD-10-CM

## 2023-10-08 ENCOUNTER — Encounter: Payer: Self-pay | Admitting: Nurse Practitioner

## 2023-10-08 ENCOUNTER — Inpatient Hospital Stay (HOSPITAL_BASED_OUTPATIENT_CLINIC_OR_DEPARTMENT_OTHER): Admitting: Nurse Practitioner

## 2023-10-08 ENCOUNTER — Inpatient Hospital Stay

## 2023-10-08 VITALS — BP 108/59 | HR 85 | Temp 97.5°F | Resp 16 | Wt 205.7 lb

## 2023-10-08 DIAGNOSIS — D649 Anemia, unspecified: Secondary | ICD-10-CM

## 2023-10-08 DIAGNOSIS — D591 Autoimmune hemolytic anemia, unspecified: Secondary | ICD-10-CM

## 2023-10-08 LAB — CBC WITH DIFFERENTIAL (CANCER CENTER ONLY)
Abs Immature Granulocytes: 0.01 K/uL (ref 0.00–0.07)
Basophils Absolute: 0 K/uL (ref 0.0–0.1)
Basophils Relative: 0 %
Eosinophils Absolute: 0 K/uL (ref 0.0–0.5)
Eosinophils Relative: 2 %
HCT: 21.5 % — ABNORMAL LOW (ref 36.0–46.0)
Hemoglobin: 7.2 g/dL — ABNORMAL LOW (ref 12.0–15.0)
Immature Granulocytes: 0 %
Lymphocytes Relative: 50 %
Lymphs Abs: 1.2 K/uL (ref 0.7–4.0)
MCH: 30.8 pg (ref 26.0–34.0)
MCHC: 33.5 g/dL (ref 30.0–36.0)
MCV: 91.9 fL (ref 80.0–100.0)
Monocytes Absolute: 0.1 K/uL (ref 0.1–1.0)
Monocytes Relative: 4 %
Neutro Abs: 1.1 K/uL — ABNORMAL LOW (ref 1.7–7.7)
Neutrophils Relative %: 44 %
Platelet Count: 76 K/uL — ABNORMAL LOW (ref 150–400)
RBC: 2.34 MIL/uL — ABNORMAL LOW (ref 3.87–5.11)
RDW: 17.7 % — ABNORMAL HIGH (ref 11.5–15.5)
WBC Count: 2.4 K/uL — ABNORMAL LOW (ref 4.0–10.5)
nRBC: 1.2 % — ABNORMAL HIGH (ref 0.0–0.2)

## 2023-10-08 LAB — CMP (CANCER CENTER ONLY)
ALT: 20 U/L (ref 0–44)
AST: 24 U/L (ref 15–41)
Albumin: 3.7 g/dL (ref 3.5–5.0)
Alkaline Phosphatase: 42 U/L (ref 38–126)
Anion gap: 8 (ref 5–15)
BUN: 13 mg/dL (ref 6–20)
CO2: 26 mmol/L (ref 22–32)
Calcium: 8.9 mg/dL (ref 8.9–10.3)
Chloride: 104 mmol/L (ref 98–111)
Creatinine: 0.53 mg/dL (ref 0.44–1.00)
GFR, Estimated: >60 mL/min (ref >60)
Glucose, Bld: 74 mg/dL (ref 70–99)
Potassium: 3.9 mmol/L (ref 3.5–5.1)
Sodium: 138 mmol/L (ref 135–145)
Total Bilirubin: 1.8 mg/dL — ABNORMAL HIGH (ref 0.0–1.2)
Total Protein: 6 g/dL — ABNORMAL LOW (ref 6.5–8.1)

## 2023-10-08 LAB — LACTATE DEHYDROGENASE: LDH: 1498 U/L — ABNORMAL HIGH (ref 98–192)

## 2023-10-08 LAB — SAMPLE TO BLOOD BANK

## 2023-10-08 MED ORDER — PREDNISONE 20 MG PO TABS: 100.0000 mg | ORAL_TABLET | Freq: Every day | ORAL | 1 refills | Status: DC

## 2023-10-08 MED ORDER — PANTOPRAZOLE SODIUM 40 MG PO TBEC: 40.0000 mg | DELAYED_RELEASE_TABLET | Freq: Every day | ORAL | 1 refills | Status: DC

## 2023-10-08 NOTE — Progress Notes (Signed)
 Pt in for follow up with NP today. Reports she continues to have pounding headache that is not relieved by tylenol . Pt states she is still having dizziness at times.   Pt reports no good BM for several weeks, reports using miralax  without results.  Pt states she is short of breath on minimal exertion and feels tired all the time.

## 2023-10-08 NOTE — Progress Notes (Signed)
 [facility] CONSULT NOTE  Patient Care Team: Danielle Channing SQUIBB, FNP as PCP - General (Family Medicine) Rennie Danielle SAUNDERS, MD as Consulting Physician (Oncology)  CHIEF COMPLAINTS/PURPOSE OF CONSULTATION: ANEMIA  HEMATOLOGY HISTORY  # IRON  DEFICIENCY ANEMIA CHRONIC [since 2019] AUG 2021- hb-9; MCV- 63; Iron  sat- 3%; EGD > 7 years ago [[place]]; colonoscopy/capsule-NA; PO iron  constipates.   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  JAK2 testing negative.  However bone marrow biopsy- [MAY 2024-negative for any myelophthisis process; suggestive of hemolysis] HOLD off metformin; and detromethomorphan- Bupropion. PNH testing-NEGATIVE; complement levels.;  Hemoglobin cascade; G6PD levels-WNL.  April 2024 abdominal ultrasound showed mild splenomegaly.    # 11/07/2022-RELAPSE-start  Rituximab  X1 [OCT 11th, 2024- ]G-4-anaphylactic reaction needing EpiPen  transporting to the emergency room.-Discontinue further rituximab . JAN 1st week, 2025- .500 mg BID; mycophenolate -DC:  Imuran + tavasslise [mid March 2025]   # MARCH 2025- OFF prednisone  [ stopped/tapered OFF 10 mg a day] x2 months.  mycophenolate  500 mg twice daily-acute hemolytic anemia- complicated nstay in the hospital/ICU-anaphylactic reaction to IV Solu-Medrol -improved with EpiPen  although she tolerated Solu-Medrol  injection well earlier in the hospital stay.  Started on prednisone  60 to 80 mg a day- # Start Imuran  100 mg/day; # DC cellcept .   #History of heavy menstrual cycles  HISTORY OF PRESENTING ILLNESS: Alone. Ambulating. Accompanied by mother  Danielle [name] 48 y.o. 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 Energy is somewhat improved but persistent shortness of breath and chest pressure when exerting herself. Chronic headaches. Ongoing acid reflux with steroids. Was unable to tolerate 120 mg prednisone  due to acid reflux. Not on protonix . Her mother has come from [place] to assist with caring for patient and her son.   Review of Systems  Constitutional:  Negative for chills, diaphoresis, fever and weight loss.  HENT:  Negative for nosebleeds and sore throat.   Eyes:  Negative for double vision.  Respiratory:  Negative for hemoptysis and sputum production.   Cardiovascular:  Negative for chest pain, palpitations and orthopnea.  Gastrointestinal:  Positive for blood in stool and constipation. Negative for abdominal pain, diarrhea, heartburn and melena.  Genitourinary:  Negative for dysuria, frequency and urgency.  Musculoskeletal:  Negative for back pain and joint pain.  Skin: Negative.  Negative for itching and rash.  Neurological:  Positive for dizziness, weakness and headaches. Negative for focal weakness.  Endo/Heme/Allergies:  Does not bruise/bleed easily.  Psychiatric/Behavioral:  Negative for depression. The patient is not nervous/anxious.     MEDICAL HISTORY:  Past Medical History:  Diagnosis Date   Anemia    Depression    Hepatic steatosis 04/01/2023    SURGICAL HISTORY: Past Surgical History:  Procedure Laterality Date   ABDOMINAL HYSTERECTOMY     COLONOSCOPY WITH PROPOFOL  N/A 05/12/2022   Procedure: COLONOSCOPY WITH PROPOFOL ;  Surgeon: Jinny Carmine, MD;  Location: [facility] ENDOSCOPY;  Service: Endoscopy;  Laterality: N/A;   ESOPHAGOGASTRODUODENOSCOPY N/A 04/08/2023   Procedure: EGD (ESOPHAGOGASTRODUODENOSCOPY);  Surgeon: Toledo, Ladell POUR, MD;  Location: [facility] ENDOSCOPY;  Service: Gastroenterology;  Laterality: N/A;   ESOPHAGOGASTRODUODENOSCOPY (EGD) WITH PROPOFOL  N/A 05/12/2022   Procedure: ESOPHAGOGASTRODUODENOSCOPY (EGD) WITH  PROPOFOL ;  Surgeon: Jinny Carmine, MD;  Location: [facility] ENDOSCOPY;  Service: Endoscopy;  Laterality: N/A;   IR BONE MARROW BIOPSY & ASPIRATION  05/28/2022   TONSILLECTOMY      SOCIAL HISTORY: Social History   Socioeconomic History   Marital status: Single    Spouse name: Not on file   Number of children: Not on file   Years of education: Not on file   Highest education level: Not on file  Occupational History   Not on file  Tobacco Use   Smoking status: Former    Current packs/day: 0.00    Average packs/day: 0.5 packs/day for 25.0 years (12.5 ttl pk-yrs)    Types: Cigarettes    Start date: 05/09/1997    Quit date: 05/10/2022    Years since quitting: 1.4   Smokeless tobacco: Never  Vaping Use   Vaping status: Never Used  Substance and Sexual Activity   Alcohol use: Not Currently   Drug use: Never   Sexual activity: Not Currently  Other Topics Concern   Not on file  Social History Narrative   Lives in [place] with 4 kids; stay at home [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    Social Drivers of Corporate investment banker Strain: Not on file  Food Insecurity: Food Insecurity Present (06/18/2023)   Received from [facility]   Hunger Vital Sign    Within the past 12 months, you worried that your food would run out before you got the money to buy more.: Sometimes true    Within the past 12 months, the food you bought just didn't last and you didn't have money to get more.: Sometimes true  Transportation Needs: No Transportation Needs (06/18/2023)   Received from [facility] - Transportation    In the past 12 months, has lack of transportation kept you from medical appointments or from getting medications?: No    Lack of Transportation (Non-Medical): No  Physical Activity: Not on file  Stress: Not on file  Social Connections: Unknown (04/01/2023)   Social Connection and Isolation Panel    Frequency of Communication with  Friends and Family: More than three times a week    Frequency of Social Gatherings with Friends and Family: More than three times a week    Attends Religious Services: More than 4 times per year    Active Member of [employer] or Organizations: No    Attends Banker Meetings: Never    Marital Status: Patient declined  Catering manager Violence: Not At Risk (04/01/2023)   Humiliation, Afraid, Rape, and Kick questionnaire    Fear of Current or Ex-Partner: No    Emotionally Abused: No    Physically Abused: No    Sexually Abused: No    FAMILY HISTORY: Family History  Problem Relation Age of Onset   Breast cancer Paternal Aunt     ALLERGIES:  is allergic to rituxan  [rituximab ] and solu-medrol  [methylprednisolone ].  MEDICATIONS:  Current Outpatient Medications  Medication Sig Dispense Refill   acetaminophen  (TYLENOL ) 500 MG tablet Take 500 mg by mouth every 6 (six) hours as needed.     albuterol  (VENTOLIN  HFA) 108 (90 Base) MCG/ACT inhaler Inhale 2 puffs into the lungs every 6 (six) hours as needed for wheezing or shortness of breath. 8 g 2   folic acid  (FOLVITE ) 1 MG tablet Take 1 tablet (1 mg total) by mouth daily. 90 tablet 1   pantoprazole  (PROTONIX ) 40 MG tablet Take 1 tablet (40 mg total) by mouth daily. Do not stop medication  abruptly. 90 tablet 1   polyethylene glycol powder (GLYCOLAX /MIRALAX ) 17 GM/SCOOP powder Take 17 g by mouth daily. Dissolve 1 capful (17g) in 4-8 ounces of liquid and take by mouth daily.     Semaglutide-Weight Management (WEGOVY) 0.25 MG/0.5ML SOAJ Inject 0.25 mg into the skin once a week.     venlafaxine XR (EFFEXOR-XR) 37.5 MG 24 hr capsule Take 37.5 mg by mouth daily.     diphenoxylate -atropine  (LOMOTIL ) 2.5-0.025 MG tablet Take 2 tablets by mouth 4 (four) times daily as needed for diarrhea or loose stools (diarrhea unrelieved by imodium). (Patient not taking: Reported on 10/08/2023) 60 tablet 0   predniSONE  (DELTASONE ) 20 MG tablet Take 5 tablets (100  mg total) by mouth daily with breakfast. Do not stop until recommended. 150 tablet 1   No current facility-administered medications for this visit.    PHYSICAL EXAMINATION: Vitals:   10/08/23 1007  BP: (!) 108/59  Pulse: 85  Resp: 16  Temp: (!) 97.5 F (36.4 C)  SpO2: 100%   Filed Weights   10/08/23 1007  Weight: 205 lb 11.2 oz (93.3 kg)   Physical Exam Vitals reviewed.  Constitutional:      Appearance: She is not ill-appearing.  HENT:     Head: Normocephalic and atraumatic.     Mouth/Throat:     Pharynx: No oropharyngeal exudate.  Cardiovascular:     Rate and Rhythm: Normal rate and regular rhythm.  Pulmonary:     Effort: Pulmonary effort is normal. No respiratory distress.     Breath sounds: No wheezing.  Abdominal:     General: There is no distension.     Palpations: Abdomen is soft.     Tenderness: There is no abdominal tenderness.  Musculoskeletal:        General: No tenderness.  Skin:    General: Skin is warm.  Neurological:     Mental Status: She is alert and oriented to person, place, and time.  Psychiatric:        Mood and Affect: Mood and affect normal.        Behavior: Behavior normal.     LABORATORY DATA:  I have reviewed the data as listed Lab Results  Component Value Date   WBC 2.4 (L) 10/08/2023   HGB 7.2 (L) 10/08/2023   HCT 21.5 (L) 10/08/2023   MCV 91.9 10/08/2023   PLT 76 (L) 10/08/2023   Recent Labs    07/20/23 1405 07/28/23 0935 08/05/23 0846 08/25/23 0826 09/14/23 1334 09/30/23 1018 10/08/23 0938  NA  --   --   --    < > 135 136 138  K  --   --   --    < > 3.8 4.2 3.9  CL  --   --   --    < > 102 100 104  CO2  --   --   --    < > 24 26 26   GLUCOSE  --   --   --    < > 103* 106* 74  BUN  --   --   --    < > 16 15 13   CREATININE  --   --   --    < > 0.56 0.52 0.53  CALCIUM  --   --   --    < > 9.4 9.3 8.9  GFRNONAA  --   --   --    < > >60 >60 >60  PROT 6.8 6.5 6.5   < >  7.0 7.0 6.0*  ALBUMIN 4.0 3.7 3.8   < > 4.1 4.2  3.7  AST 51* 25 18   < > 73* 86* 24  ALT 70* 78* 17   < > 42 56* 20  ALKPHOS 71 95 70   < > 62 54 42  BILITOT 2.1* 1.5* 1.8*   < > 2.0* 2.0* 1.8*  BILIDIR 0.4* 0.3* 0.3*  --   --   --   --   IBILI 1.7* 1.2* 1.5*  --   --   --   --    < > = values in this interval not displayed.   Component 09:38 (10/08/23) 8 d ago (09/30/23) 2 wk ago (09/23/23) 3 wk ago (09/14/23) 1 mo ago (09/08/23)  LDH 1,498 High  >2,500 High  CM >2,500 High  CM >2,500 High  CM 1,841 High  CM   No results found.  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 G-4 anaphylactic reaction to Rituximab . Discontinued Tavvalisse in June secondary to elevated LFTs. AUG hide he denies use for refractory hemolytic anemia hemolytic anemia seen 2025-Discontinued Imuran    # Autoimmune hemolytic anemia-relapsed-currently on prednisone  80 mg a day not responsive to steroids/or Imuran . Continue folic acid .  Discontinued Imuran .   # Unable to tolerate prednsione 120 mg a day d/t reflux/vomiting. Is agreeable to trial 100 mg a day and   # recommend she restart protonix  40 mg daily. She'll take prednisone  and protonix  an hour apart. Can use tums and/or pepcid  over the counter for breakthrough symptoms. Reviewed action of protonix  and advised to take daily as opposed to as needed.    # Hmg today 7.2. Decreased. LDH improved to 1498 however. Hold off on transfusion. Goal to keep hemoglobin > 7.    # Discussed the need for escalating the therapy given her ongoing -refractoriness to steroids and intolerance to higher dose.  Patient has had reaction to multiple drugs including possibly IV Solu-Medrol .  Recommend infusions like Solu-Medrol /Cytoxan in the hospital.  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 Mother is here now and we will  monitor her counts closely.    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    DISPOSITION: Cancel blood tomorrow 1 weeks- lab (cbc, ldh, hold tube) D2 possible 1 unit pRBCs 2 weeks- lab (cbc, ldh, hold tube), Dr Rennie D2 possible 1 unit pRBCs- la  No problem-specific Assessment & Plan notes found for this encounter.  All questions were answered. The patient knows to call the clinic with any problems, questions or concerns.   Tinnie Danielle Dawn, NP 10/08/2023

## 2023-10-09 ENCOUNTER — Encounter

## 2023-10-15 ENCOUNTER — Other Ambulatory Visit

## 2023-10-15 ENCOUNTER — Other Ambulatory Visit: Payer: Self-pay | Admitting: *Deleted

## 2023-10-15 ENCOUNTER — Inpatient Hospital Stay

## 2023-10-15 DIAGNOSIS — D649 Anemia, unspecified: Secondary | ICD-10-CM

## 2023-10-15 DIAGNOSIS — D591 Autoimmune hemolytic anemia, unspecified: Secondary | ICD-10-CM | POA: Diagnosis not present

## 2023-10-15 LAB — CBC WITH DIFFERENTIAL (CANCER CENTER ONLY)
Abs Immature Granulocytes: 0.03 K/uL (ref 0.00–0.07)
Basophils Absolute: 0 K/uL (ref 0.0–0.1)
Basophils Relative: 0 %
Eosinophils Absolute: 0 K/uL (ref 0.0–0.5)
Eosinophils Relative: 1 %
HCT: 23.4 % — ABNORMAL LOW (ref 36.0–46.0)
Hemoglobin: 7.9 g/dL — ABNORMAL LOW (ref 12.0–15.0)
Immature Granulocytes: 1 %
Lymphocytes Relative: 41 %
Lymphs Abs: 1.5 K/uL (ref 0.7–4.0)
MCH: 32.9 pg (ref 26.0–34.0)
MCHC: 33.8 g/dL (ref 30.0–36.0)
MCV: 97.5 fL (ref 80.0–100.0)
Monocytes Absolute: 0.1 K/uL (ref 0.1–1.0)
Monocytes Relative: 3 %
Neutro Abs: 2 K/uL (ref 1.7–7.7)
Neutrophils Relative %: 54 %
Platelet Count: 150 K/uL (ref 150–400)
RBC: 2.4 MIL/uL — ABNORMAL LOW (ref 3.87–5.11)
RDW: 24.7 % — ABNORMAL HIGH (ref 11.5–15.5)
WBC Count: 3.6 K/uL — ABNORMAL LOW (ref 4.0–10.5)
nRBC: 0.8 % — ABNORMAL HIGH (ref 0.0–0.2)

## 2023-10-15 LAB — RETICULOCYTES
Immature Retic Fract: 24.2 % — ABNORMAL HIGH (ref 2.3–15.9)
RBC.: 2.36 MIL/uL — ABNORMAL LOW (ref 3.87–5.11)
Retic Count, Absolute: 141.1 K/uL (ref 19.0–186.0)
Retic Ct Pct: 6 % — ABNORMAL HIGH (ref 0.4–3.1)

## 2023-10-15 LAB — PREPARE RBC (CROSSMATCH)

## 2023-10-15 LAB — LACTATE DEHYDROGENASE: LDH: 684 U/L — ABNORMAL HIGH (ref 98–192)

## 2023-10-16 ENCOUNTER — Inpatient Hospital Stay

## 2023-10-16 ENCOUNTER — Encounter

## 2023-10-16 DIAGNOSIS — D591 Autoimmune hemolytic anemia, unspecified: Secondary | ICD-10-CM

## 2023-10-16 MED ORDER — ACETAMINOPHEN 325 MG PO TABS
650.0000 mg | ORAL_TABLET | Freq: Once | ORAL | Status: AC
  Administered 2023-10-16: 650 mg via ORAL
  Filled 2023-10-16: qty 2

## 2023-10-16 MED ORDER — SODIUM CHLORIDE 0.9% IV SOLUTION
250.0000 mL | INTRAVENOUS | Status: DC
  Administered 2023-10-16: 100 mL via INTRAVENOUS
  Filled 2023-10-16: qty 250

## 2023-10-17 LAB — TYPE AND SCREEN
ABO/RH(D): B POS
Antibody Screen: POSITIVE
Donor AG Type: NEGATIVE
Unit division: 0

## 2023-10-17 LAB — BPAM RBC
Blood Product Expiration Date: 202509192359
ISSUE DATE / TIME: 202509190835
Unit Type and Rh: 1700

## 2023-10-22 ENCOUNTER — Inpatient Hospital Stay (HOSPITAL_BASED_OUTPATIENT_CLINIC_OR_DEPARTMENT_OTHER): Admitting: Nurse Practitioner

## 2023-10-22 ENCOUNTER — Inpatient Hospital Stay

## 2023-10-22 ENCOUNTER — Encounter: Payer: Self-pay | Admitting: Nurse Practitioner

## 2023-10-22 VITALS — BP 102/59 | HR 93 | Resp 18 | Wt 205.0 lb

## 2023-10-22 DIAGNOSIS — D591 Autoimmune hemolytic anemia, unspecified: Secondary | ICD-10-CM

## 2023-10-22 DIAGNOSIS — D649 Anemia, unspecified: Secondary | ICD-10-CM

## 2023-10-22 LAB — CBC WITH DIFFERENTIAL (CANCER CENTER ONLY)
Abs Immature Granulocytes: 0.02 K/uL (ref 0.00–0.07)
Basophils Absolute: 0 K/uL (ref 0.0–0.1)
Basophils Relative: 0 %
Eosinophils Absolute: 0.1 K/uL (ref 0.0–0.5)
Eosinophils Relative: 2 %
HCT: 30 % — ABNORMAL LOW (ref 36.0–46.0)
Hemoglobin: 10.3 g/dL — ABNORMAL LOW (ref 12.0–15.0)
Immature Granulocytes: 1 %
Lymphocytes Relative: 30 %
Lymphs Abs: 1 K/uL (ref 0.7–4.0)
MCH: 35 pg — ABNORMAL HIGH (ref 26.0–34.0)
MCHC: 34.3 g/dL (ref 30.0–36.0)
MCV: 102 fL — ABNORMAL HIGH (ref 80.0–100.0)
Monocytes Absolute: 0.2 K/uL (ref 0.1–1.0)
Monocytes Relative: 5 %
Neutro Abs: 2.1 K/uL (ref 1.7–7.7)
Neutrophils Relative %: 62 %
Platelet Count: 163 K/uL (ref 150–400)
RBC: 2.94 MIL/uL — ABNORMAL LOW (ref 3.87–5.11)
WBC Count: 3.4 K/uL — ABNORMAL LOW (ref 4.0–10.5)
nRBC: 0 % (ref 0.0–0.2)

## 2023-10-22 LAB — SAMPLE TO BLOOD BANK

## 2023-10-22 LAB — RETICULOCYTES
Immature Retic Fract: 9.1 % (ref 2.3–15.9)
RBC.: 2.65 MIL/uL — ABNORMAL LOW (ref 3.87–5.11)
Retic Count, Absolute: 213 K/uL — ABNORMAL HIGH (ref 19.0–186.0)
Retic Ct Pct: 8 % — ABNORMAL HIGH (ref 0.4–3.1)

## 2023-10-22 LAB — LACTATE DEHYDROGENASE: LDH: 353 U/L — ABNORMAL HIGH (ref 98–192)

## 2023-10-22 NOTE — Progress Notes (Signed)
 Headache, right hip pain, feeling a little better though, nausea, constipation is getting a little better,

## 2023-10-22 NOTE — Progress Notes (Signed)
 [facility] CONSULT NOTE  Patient Care Team: Donal Channing SQUIBB, FNP as PCP - General (Family Medicine) Rennie Cindy SAUNDERS, MD as Consulting Physician (Oncology)  CHIEF COMPLAINTS/PURPOSE OF CONSULTATION: ANEMIA  HEMATOLOGY HISTORY  # IRON  DEFICIENCY ANEMIA CHRONIC [since 2019] AUG 2021- hb-9; MCV- 63; Iron  sat- 3%; EGD > 7 years ago [[place]]; colonoscopy/capsule-NA; PO iron  constipates.   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  JAK2 testing negative.  However bone marrow biopsy- [MAY 2024-negative for any myelophthisis process; suggestive of hemolysis] HOLD off metformin; and detromethomorphan- Bupropion. PNH testing-NEGATIVE; complement levels.;  Hemoglobin cascade; G6PD levels-WNL.  April 2024 abdominal ultrasound showed mild splenomegaly.    # 11/07/2022-RELAPSE-start  Rituximab  X1 [OCT 11th, 2024- ]G-4-anaphylactic reaction needing EpiPen  transporting to the emergency room.-Discontinue further rituximab . JAN 1st week, 2025- .500 mg BID; mycophenolate -DC:  Imuran + tavasslise [mid March 2025]   # MARCH 2025- OFF prednisone  [ stopped/tapered OFF 10 mg a day] x2 months.  mycophenolate  500 mg twice daily-acute hemolytic anemia- complicated nstay in the hospital/ICU-anaphylactic reaction to IV Solu-Medrol -improved with EpiPen  although she tolerated Solu-Medrol  injection well earlier in the hospital stay.  Started on prednisone  60 to 80 mg a day- # Start Imuran  100 mg/day; # DC cellcept .   # History of heavy menstrual cycles  HISTORY OF PRESENTING ILLNESS: Alone. Ambulating. Accompanied by mother  Danielle [name] 48 y.o. 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  She previously stopped Imuran  on.  She returns to clinic today for routine follow-up.  Her energy has improved and her exertional dyspnea has improved.  She is tolerating the prednisone  well.  Some acid reflux but overall improved.  Some abdominal pain that is mild and not bothersome.  Her mother continues to stay in town to assist with caring for her and her son.  Review of Systems  Constitutional:  Negative for chills, diaphoresis, fever and weight loss.  HENT:  Negative for nosebleeds and sore throat.   Eyes:  Negative for double vision.  Respiratory:  Negative for hemoptysis and sputum production.   Cardiovascular:  Negative for chest pain, palpitations and orthopnea.  Gastrointestinal:  Positive for blood in stool and constipation. Negative for abdominal pain, diarrhea, heartburn and melena.  Genitourinary:  Negative for dysuria, frequency and urgency.  Musculoskeletal:  Negative for back pain and joint pain.  Skin: Negative.  Negative for itching and rash.  Neurological:  Negative for dizziness, focal weakness, weakness and headaches.  Endo/Heme/Allergies:  Does not bruise/bleed easily.  Psychiatric/Behavioral:  Negative for depression. The patient is not nervous/anxious.     MEDICAL HISTORY:  Past Medical History:  Diagnosis Date   Anemia    Depression    Hepatic steatosis 04/01/2023    SURGICAL HISTORY: Past Surgical History:  Procedure Laterality Date   ABDOMINAL HYSTERECTOMY     COLONOSCOPY WITH PROPOFOL  N/A 05/12/2022   Procedure: COLONOSCOPY WITH PROPOFOL ;  Surgeon: Jinny Carmine, MD;  Location: [facility] ENDOSCOPY;  Service: Endoscopy;  Laterality: N/A;   ESOPHAGOGASTRODUODENOSCOPY N/A 04/08/2023   Procedure: EGD (ESOPHAGOGASTRODUODENOSCOPY);  Surgeon: Toledo, Ladell POUR, MD;  Location: [facility] ENDOSCOPY;  Service: Gastroenterology;  Laterality: N/A;   ESOPHAGOGASTRODUODENOSCOPY (EGD) WITH PROPOFOL  N/A 05/12/2022   Procedure: ESOPHAGOGASTRODUODENOSCOPY (EGD) WITH  PROPOFOL ;  Surgeon: Jinny Carmine,  MD;  Location: [facility] ENDOSCOPY;  Service: Endoscopy;  Laterality: N/A;   IR BONE MARROW BIOPSY & ASPIRATION  05/28/2022   TONSILLECTOMY      SOCIAL HISTORY: Social History   Socioeconomic History   Marital status: Single    Spouse name: Not on file   Number of children: Not on file   Years of education: Not on file   Highest education level: Not on file  Occupational History   Not on file  Tobacco Use   Smoking status: Former    Current packs/day: 0.00    Average packs/day: 0.5 packs/day for 25.0 years (12.5 ttl pk-yrs)    Types: Cigarettes    Start date: 05/09/1997    Quit date: 05/10/2022    Years since quitting: 1.4   Smokeless tobacco: Never  Vaping Use   Vaping status: Never Used  Substance and Sexual Activity   Alcohol use: Not Currently   Drug use: Never   Sexual activity: Not Currently  Other Topics Concern   Not on file  Social History Narrative   Lives in [place] with 4 kids; stay at home [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    Social Drivers of Corporate investment banker Strain: Not on file  Food Insecurity: Food Insecurity Present (06/18/2023)   [facility]   Hunger Vital Sign    Within the past 12 months, you worried that your food would run out before you got the money to buy more.: Sometimes true    Within the past 12 months, the food you bought just didn't last and you didn't have money to get more.: Sometimes true  Transportation Needs: No Transportation Needs (06/18/2023)   Received from [facility] - Transportation    In the past 12 months, has lack of transportation kept you from medical appointments or from getting medications?: No    Lack of Transportation (Non-Medical): No  Physical Activity: Not on file  Stress: Not on file  Social Connections: Unknown (04/01/2023)   Social Connection and Isolation Panel    Frequency of Communication with  Friends and Family: More than three times a week    Frequency of Social Gatherings with Friends and Family: More than three times a week    Attends Religious Services: More than 4 times per year    Active Member of [employer] or Organizations: No    Attends Banker Meetings: Never    Marital Status: Patient declined  Catering manager Violence: Not At Risk (04/01/2023)   Humiliation, Afraid, Rape, and Kick questionnaire    Fear of Current or Ex-Partner: No    Emotionally Abused: No    Physically Abused: No    Sexually Abused: No    FAMILY HISTORY: Family History  Problem Relation Age of Onset   Breast cancer Paternal Aunt     ALLERGIES:  is allergic to rituxan  [rituximab ] and solu-medrol  [methylprednisolone ].  MEDICATIONS:  Current Outpatient Medications  Medication Sig Dispense Refill   acetaminophen  (TYLENOL ) 500 MG tablet Take 500 mg by mouth every 6 (six) hours as needed.     albuterol  (VENTOLIN  HFA) 108 (90 Base) MCG/ACT inhaler Inhale 2 puffs into the lungs every 6 (six) hours as needed for wheezing or shortness of breath. 8 g 2   diphenoxylate -atropine  (LOMOTIL ) 2.5-0.025 MG tablet Take 2 tablets by mouth 4 (four) times daily as needed for diarrhea or loose stools (diarrhea unrelieved by imodium). 60 tablet 0  folic acid  (FOLVITE ) 1 MG tablet Take 1 tablet (1 mg total) by mouth daily. 90 tablet 1   pantoprazole  (PROTONIX ) 40 MG tablet Take 1 tablet (40 mg total) by mouth daily. Do not stop medication abruptly. 90 tablet 1   polyethylene glycol powder (GLYCOLAX /MIRALAX ) 17 GM/SCOOP powder Take 17 g by mouth daily. Dissolve 1 capful (17g) in 4-8 ounces of liquid and take by mouth daily.     predniSONE  (DELTASONE ) 20 MG tablet Take 5 tablets (100 mg total) by mouth daily with breakfast. Do not stop until recommended. 150 tablet 1   Semaglutide-Weight Management (WEGOVY) 0.25 MG/0.5ML SOAJ Inject 0.25 mg into the skin once a week.     venlafaxine XR (EFFEXOR-XR) 37.5 MG 24  hr capsule Take 37.5 mg by mouth daily.     No current facility-administered medications for this visit.    PHYSICAL EXAMINATION: Vitals:   10/22/23 0951  BP: (!) 102/59  Pulse: 93  Resp: 18  SpO2: 100%   Filed Weights   10/22/23 0946  Weight: 205 lb (93 kg)   Physical Exam Vitals reviewed.  Constitutional:      Appearance: She is not ill-appearing.  HENT:     Head: Normocephalic and atraumatic.     Mouth/Throat:     Pharynx: No oropharyngeal exudate.  Cardiovascular:     Rate and Rhythm: Normal rate and regular rhythm.  Pulmonary:     Effort: Pulmonary effort is normal. No respiratory distress.     Breath sounds: No wheezing.  Abdominal:     General: There is no distension.     Palpations: Abdomen is soft.     Tenderness: There is no abdominal tenderness.  Musculoskeletal:        General: No tenderness.  Skin:    General: Skin is warm.  Neurological:     Mental Status: She is alert and oriented to person, place, and time.  Psychiatric:        Mood and Affect: Mood and affect normal.        Behavior: Behavior normal.     LABORATORY DATA:  I have reviewed the data as listed Lab Results  Component Value Date   WBC 3.6 (L) 10/15/2023   HGB 7.9 (L) 10/15/2023   HCT 23.4 (L) 10/15/2023   MCV 97.5 10/15/2023   PLT 150 10/15/2023   Recent Labs    07/20/23 1405 07/28/23 0935 08/05/23 0846 08/25/23 0826 09/14/23 1334 09/30/23 1018 10/08/23 0938  NA  --   --   --    < > 135 136 138  K  --   --   --    < > 3.8 4.2 3.9  CL  --   --   --    < > 102 100 104  CO2  --   --   --    < > 24 26 26   GLUCOSE  --   --   --    < > 103* 106* 74  BUN  --   --   --    < > 16 15 13   CREATININE  --   --   --    < > 0.56 0.52 0.53  CALCIUM  --   --   --    < > 9.4 9.3 8.9  GFRNONAA  --   --   --    < > >60 >60 >60  PROT 6.8 6.5 6.5   < > 7.0 7.0 6.0*  ALBUMIN 4.0  3.7 3.8   < > 4.1 4.2 3.7  AST 51* 25 18   < > 73* 86* 24  ALT 70* 78* 17   < > 42 56* 20  ALKPHOS 71 95 70    < > 62 54 42  BILITOT 2.1* 1.5* 1.8*   < > 2.0* 2.0* 1.8*  BILIDIR 0.4* 0.3* 0.3*  --   --   --   --   IBILI 1.7* 1.2* 1.5*  --   --   --   --    < > = values in this interval not displayed.   Component Ref Range & Units (hover) 09:28 (10/22/23) 7 d ago (10/15/23) 2 wk ago (10/08/23) 3 wk ago (09/30/23) 4 wk ago (09/23/23) 1 mo ago (09/14/23) 1 mo ago (09/08/23)  LDH 353 High  684 High  CM 1,498 High  CM >2,500 High  CM >2,500 High  CM >2,500 High  CM 1,841 High     No results found.  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 G-4 anaphylactic reaction to Rituximab . Discontinued Tavvalisse in June secondary to elevated LFTs. AUG hide he denies use for refractory hemolytic anemia hemolytic anemia seen 2025-Discontinued Imuran    # Autoimmune hemolytic anemia-relapsed-currently on prednisone  100 mg/day. Continue folic acid .  Discontinued Imuran . Tolerating well and LDH has improved, now 353. Hmg improved to 10.9. Symptomatically improved.  No need for transfusion.  Goal hemoglobin greater than 7.  For now I want to continue steroids at 100 mg a day.  Once her LDH normalizes a bit more then I will consider weaning to 80 mg daily.  Will have to reescalate steroids if LDH rises.  Advised not to stop steroids abruptly.  # Acid reflux-continue Protonix  40 mg daily.  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    DISPOSITION: Cancel blood tomorrow 2 weeks- lab (cbc, ldh, hold tube), Dr Sherre D2 possible 1 unit pRBCs- la   No problem-specific Assessment & Plan notes found for this encounter.  All questions were answered. The patient  knows to call the clinic with any problems, questions or concerns.   Tinnie KANDICE Dawn, NP 10/22/2023

## 2023-10-23 ENCOUNTER — Other Ambulatory Visit: Payer: Self-pay

## 2023-10-23 ENCOUNTER — Emergency Department

## 2023-10-23 ENCOUNTER — Observation Stay

## 2023-10-23 ENCOUNTER — Encounter
Admission: EM | Disposition: A | Discharge status: Home / Self Care | Payer: Self-pay | Source: Home / Self Care | Attending: Emergency Medicine

## 2023-10-23 ENCOUNTER — Encounter: Payer: Self-pay | Admitting: Surgery

## 2023-10-23 ENCOUNTER — Inpatient Hospital Stay

## 2023-10-23 ENCOUNTER — Observation Stay
Admission: EM | Admit: 2023-10-23 | Discharge: 2023-10-25 | Disposition: A | Discharge status: Home / Self Care | Attending: Surgery | Admitting: Surgery

## 2023-10-23 DIAGNOSIS — K219 Gastro-esophageal reflux disease without esophagitis: Secondary | ICD-10-CM | POA: Insufficient documentation

## 2023-10-23 DIAGNOSIS — K810 Acute cholecystitis: Secondary | ICD-10-CM | POA: Diagnosis present

## 2023-10-23 DIAGNOSIS — K8012 Calculus of gallbladder with acute and chronic cholecystitis without obstruction: Secondary | ICD-10-CM | POA: Diagnosis not present

## 2023-10-23 DIAGNOSIS — Z87891 Personal history of nicotine dependence: Secondary | ICD-10-CM | POA: Diagnosis not present

## 2023-10-23 DIAGNOSIS — K8000 Calculus of gallbladder with acute cholecystitis without obstruction: Secondary | ICD-10-CM | POA: Diagnosis not present

## 2023-10-23 DIAGNOSIS — D591 Autoimmune hemolytic anemia, unspecified: Secondary | ICD-10-CM | POA: Diagnosis not present

## 2023-10-23 DIAGNOSIS — R1011 Right upper quadrant pain: Secondary | ICD-10-CM | POA: Diagnosis present

## 2023-10-23 LAB — COMPREHENSIVE METABOLIC PANEL WITH GFR
ALT: 62 U/L — ABNORMAL HIGH (ref 0–44)
AST: 143 U/L — ABNORMAL HIGH (ref 15–41)
Albumin: 4.1 g/dL (ref 3.5–5.0)
Alkaline Phosphatase: 58 U/L (ref 38–126)
Anion gap: 12 (ref 5–15)
BUN: 14 mg/dL (ref 6–20)
CO2: 23 mmol/L (ref 22–32)
Calcium: 8.9 mg/dL (ref 8.9–10.3)
Chloride: 107 mmol/L (ref 98–111)
Creatinine, Ser: 0.78 mg/dL (ref 0.44–1.00)
GFR, Estimated: >60 mL/min (ref >60)
Glucose, Bld: 119 mg/dL — ABNORMAL HIGH (ref 70–99)
Potassium: 3.8 mmol/L (ref 3.5–5.1)
Sodium: 142 mmol/L (ref 135–145)
Total Bilirubin: 2.2 mg/dL — ABNORMAL HIGH (ref 0.0–1.2)
Total Protein: 7 g/dL (ref 6.5–8.1)

## 2023-10-23 LAB — CBC WITH DIFFERENTIAL/PLATELET
Abs Immature Granulocytes: 0.03 K/uL (ref 0.00–0.07)
Basophils Absolute: 0 K/uL (ref 0.0–0.1)
Basophils Relative: 0 %
Eosinophils Absolute: 0.1 K/uL (ref 0.0–0.5)
Eosinophils Relative: 1 %
HCT: 30.8 % — ABNORMAL LOW (ref 36.0–46.0)
Hemoglobin: 10.5 g/dL — ABNORMAL LOW (ref 12.0–15.0)
Immature Granulocytes: 0 %
Lymphocytes Relative: 21 %
Lymphs Abs: 1.4 K/uL (ref 0.7–4.0)
MCH: 35.2 pg — ABNORMAL HIGH (ref 26.0–34.0)
MCHC: 34.1 g/dL (ref 30.0–36.0)
MCV: 103.4 fL — ABNORMAL HIGH (ref 80.0–100.0)
Monocytes Absolute: 0.2 K/uL (ref 0.1–1.0)
Monocytes Relative: 3 %
Neutro Abs: 5.1 K/uL (ref 1.7–7.7)
Neutrophils Relative %: 75 %
Platelets: 166 K/uL (ref 150–400)
RBC: 2.98 MIL/uL — ABNORMAL LOW (ref 3.87–5.11)
Smear Review: NORMAL
WBC: 6.9 K/uL (ref 4.0–10.5)
nRBC: 0 % (ref 0.0–0.2)

## 2023-10-23 LAB — LACTIC ACID, PLASMA
Lactic Acid, Venous: 3.9 mmol/L (ref 0.5–1.9)
Lactic Acid, Venous: 2.1 mmol/L (ref 0.5–1.9)

## 2023-10-23 LAB — LIPASE, BLOOD: Lipase: 76 U/L — ABNORMAL HIGH (ref 11–51)

## 2023-10-23 SURGERY — CHOLECYSTECTOMY, ROBOT-ASSISTED, LAPAROSCOPIC
Anesthesia: General

## 2023-10-23 MED ORDER — OXYCODONE HCL 5 MG PO TABS
5.0000 mg | ORAL_TABLET | ORAL | Status: DC | PRN
  Administered 2023-10-23 – 2023-10-24 (×2): 10 mg via ORAL
  Filled 2023-10-23 (×2): qty 2

## 2023-10-23 MED ORDER — HYDROMORPHONE HCL 1 MG/ML IJ SOLN
INTRAMUSCULAR | Status: AC
  Filled 2023-10-23: qty 1

## 2023-10-23 MED ORDER — HYDROCORTISONE SOD SUC (PF) 100 MG IJ SOLR
INTRAMUSCULAR | Status: AC
  Filled 2023-10-23: qty 2

## 2023-10-23 MED ORDER — ONDANSETRON HCL 4 MG/2ML IJ SOLN
4.0000 mg | Freq: Once | INTRAMUSCULAR | Status: AC
  Administered 2023-10-23: 4 mg via INTRAVENOUS
  Filled 2023-10-23: qty 2

## 2023-10-23 MED ORDER — ONDANSETRON HCL 4 MG/2ML IJ SOLN
4.0000 mg | Freq: Four times a day (QID) | INTRAMUSCULAR | Status: DC | PRN
  Administered 2023-10-23: 4 mg via INTRAVENOUS

## 2023-10-23 MED ORDER — BUPIVACAINE-EPINEPHRINE (PF) 0.25% -1:200000 IJ SOLN
INTRAMUSCULAR | Status: AC
  Filled 2023-10-23: qty 30

## 2023-10-23 MED ORDER — PROPOFOL 10 MG/ML IV BOLUS
INTRAVENOUS | Status: DC | PRN
  Administered 2023-10-23: 150 mg via INTRAVENOUS

## 2023-10-23 MED ORDER — PIPERACILLIN-TAZOBACTAM 3.375 G IVPB
3.3750 g | Freq: Three times a day (TID) | INTRAVENOUS | Status: DC
  Administered 2023-10-23 – 2023-10-25 (×7): 3.375 g via INTRAVENOUS
  Filled 2023-10-23 (×7): qty 50

## 2023-10-23 MED ORDER — LACTATED RINGERS IV SOLN: INTRAVENOUS | Status: DC | PRN

## 2023-10-23 MED ORDER — CHLORHEXIDINE GLUCONATE CLOTH 2 % EX PADS
6.0000 | MEDICATED_PAD | Freq: Every day | CUTANEOUS | Status: DC
  Administered 2023-10-24 – 2023-10-25 (×2): 6 via TOPICAL

## 2023-10-23 MED ORDER — PHENYLEPHRINE 80 MCG/ML (10ML) SYRINGE FOR IV PUSH (FOR BLOOD PRESSURE SUPPORT)
PREFILLED_SYRINGE | INTRAVENOUS | Status: DC | PRN
  Administered 2023-10-23 (×2): 160 ug via INTRAVENOUS
  Administered 2023-10-23: 80 ug via INTRAVENOUS

## 2023-10-23 MED ORDER — OXYCODONE HCL 5 MG PO TABS
5.0000 mg | ORAL_TABLET | Freq: Once | ORAL | Status: AC | PRN
  Administered 2023-10-23: 5 mg via ORAL

## 2023-10-23 MED ORDER — INDOCYANINE GREEN 25 MG IV SOLR
1.2500 mg | INTRAVENOUS | Status: AC
  Administered 2023-10-23: 1.25 mg via INTRAVENOUS

## 2023-10-23 MED ORDER — 0.9 % SODIUM CHLORIDE (POUR BTL) OPTIME
TOPICAL | Status: DC | PRN
  Administered 2023-10-23: 500 mL

## 2023-10-23 MED ORDER — MIDAZOLAM HCL 2 MG/2ML IJ SOLN
INTRAMUSCULAR | Status: DC | PRN
  Administered 2023-10-23: 2 mg via INTRAVENOUS

## 2023-10-23 MED ORDER — DEXMEDETOMIDINE HCL IN NACL 80 MCG/20ML IV SOLN
INTRAVENOUS | Status: DC | PRN
  Administered 2023-10-23 (×2): 4 ug via INTRAVENOUS
  Administered 2023-10-23: 8 ug via INTRAVENOUS

## 2023-10-23 MED ORDER — FENTANYL CITRATE (PF) 100 MCG/2ML IJ SOLN
INTRAMUSCULAR | Status: DC | PRN
  Administered 2023-10-23 (×3): 50 ug via INTRAVENOUS

## 2023-10-23 MED ORDER — OXYCODONE HCL 5 MG PO TABS
ORAL_TABLET | ORAL | Status: AC
  Filled 2023-10-23: qty 1

## 2023-10-23 MED ORDER — ONDANSETRON HCL 4 MG/2ML IJ SOLN
INTRAMUSCULAR | Status: AC
  Filled 2023-10-23: qty 2

## 2023-10-23 MED ORDER — HYDROMORPHONE HCL 1 MG/ML IJ SOLN
1.0000 mg | Freq: Once | INTRAMUSCULAR | Status: AC
  Administered 2023-10-23: 1 mg via INTRAVENOUS
  Filled 2023-10-23: qty 1

## 2023-10-23 MED ORDER — BUPIVACAINE-EPINEPHRINE (PF) 0.25% -1:200000 IJ SOLN
INTRAMUSCULAR | Status: DC | PRN
  Administered 2023-10-23: 20 mL

## 2023-10-23 MED ORDER — LIDOCAINE HCL (CARDIAC) PF 100 MG/5ML IV SOSY
PREFILLED_SYRINGE | INTRAVENOUS | Status: DC | PRN
  Administered 2023-10-23: 80 mg via INTRAVENOUS

## 2023-10-23 MED ORDER — SODIUM CHLORIDE 0.9% FLUSH
10.0000 mL | Freq: Two times a day (BID) | INTRAVENOUS | Status: DC
  Administered 2023-10-23 – 2023-10-24 (×2): 10 mL
  Administered 2023-10-24 – 2023-10-25 (×2): 20 mL

## 2023-10-23 MED ORDER — GADOBUTROL 1 MMOL/ML IV SOLN
9.0000 mL | Freq: Once | INTRAVENOUS | Status: AC | PRN
  Administered 2023-10-23: 9 mL via INTRAVENOUS

## 2023-10-23 MED ORDER — FENTANYL CITRATE PF 50 MCG/ML IJ SOSY
50.0000 ug | PREFILLED_SYRINGE | Freq: Once | INTRAMUSCULAR | Status: AC
  Administered 2023-10-23: 50 ug via INTRAVENOUS
  Filled 2023-10-23: qty 1

## 2023-10-23 MED ORDER — ROCURONIUM BROMIDE 100 MG/10ML IV SOLN
INTRAVENOUS | Status: DC | PRN
  Administered 2023-10-23: 10 mg via INTRAVENOUS
  Administered 2023-10-23: 50 mg via INTRAVENOUS

## 2023-10-23 MED ORDER — ACETAMINOPHEN 10 MG/ML IV SOLN
INTRAVENOUS | Status: DC | PRN
  Administered 2023-10-23: 1000 mg via INTRAVENOUS

## 2023-10-23 MED ORDER — ACETAMINOPHEN 10 MG/ML IV SOLN
INTRAVENOUS | Status: AC
  Filled 2023-10-23: qty 100

## 2023-10-23 MED ORDER — MORPHINE SULFATE (PF) 4 MG/ML IV SOLN
4.0000 mg | INTRAVENOUS | Status: DC | PRN
  Administered 2023-10-23 – 2023-10-24 (×4): 4 mg via INTRAVENOUS
  Filled 2023-10-23 (×4): qty 1

## 2023-10-23 MED ORDER — HYDROCORTISONE SOD SUC (PF) 100 MG IJ SOLR
100.0000 mg | Freq: Two times a day (BID) | INTRAMUSCULAR | Status: AC
  Administered 2023-10-23 – 2023-10-25 (×4): 100 mg via INTRAVENOUS
  Filled 2023-10-23 (×5): qty 2

## 2023-10-23 MED ORDER — SODIUM CHLORIDE 0.9 % IV BOLUS
500.0000 mL | Freq: Once | INTRAVENOUS | Status: AC
  Administered 2023-10-23: 500 mL via INTRAVENOUS

## 2023-10-23 MED ORDER — FENTANYL CITRATE (PF) 100 MCG/2ML IJ SOLN
INTRAMUSCULAR | Status: AC
  Filled 2023-10-23: qty 2

## 2023-10-23 MED ORDER — PANTOPRAZOLE SODIUM 40 MG IV SOLR
40.0000 mg | Freq: Two times a day (BID) | INTRAVENOUS | Status: DC
  Administered 2023-10-23 – 2023-10-25 (×5): 40 mg via INTRAVENOUS
  Filled 2023-10-23 (×5): qty 10

## 2023-10-23 MED ORDER — SUCCINYLCHOLINE CHLORIDE 200 MG/10ML IV SOSY
PREFILLED_SYRINGE | INTRAVENOUS | Status: DC | PRN
  Administered 2023-10-23: 100 mg via INTRAVENOUS

## 2023-10-23 MED ORDER — SUCCINYLCHOLINE CHLORIDE 200 MG/10ML IV SOSY
PREFILLED_SYRINGE | INTRAVENOUS | Status: AC
Start: 2023-10-23 — End: 2023-10-23
  Filled 2023-10-23: qty 10

## 2023-10-23 MED ORDER — OXYCODONE HCL 5 MG/5ML PO SOLN: 5.0000 mg | Freq: Once | ORAL | Status: AC | PRN

## 2023-10-23 MED ORDER — FENTANYL CITRATE (PF) 100 MCG/2ML IJ SOLN
25.0000 ug | INTRAMUSCULAR | Status: DC | PRN
  Administered 2023-10-23: 50 ug via INTRAVENOUS
  Administered 2023-10-23 (×2): 25 ug via INTRAVENOUS
  Administered 2023-10-23 (×2): 50 ug via INTRAVENOUS

## 2023-10-23 MED ORDER — LIDOCAINE HCL (PF) 2 % IJ SOLN
INTRAMUSCULAR | Status: AC
  Filled 2023-10-23: qty 5

## 2023-10-23 MED ORDER — PANTOPRAZOLE SODIUM 40 MG IV SOLR: 40.0000 mg | Freq: Every day | INTRAVENOUS | Status: DC

## 2023-10-23 MED ORDER — ROCURONIUM BROMIDE 10 MG/ML (PF) SYRINGE
PREFILLED_SYRINGE | INTRAVENOUS | Status: AC
  Filled 2023-10-23: qty 10

## 2023-10-23 MED ORDER — SUGAMMADEX SODIUM 200 MG/2ML IV SOLN
INTRAVENOUS | Status: DC | PRN
  Administered 2023-10-23: 200 mg via INTRAVENOUS

## 2023-10-23 MED ORDER — IOHEXOL 300 MG/ML  SOLN: 100.0000 mL | Freq: Once | INTRAMUSCULAR | Status: DC | PRN

## 2023-10-23 MED ORDER — SODIUM CHLORIDE 0.9 % IV SOLN: INTRAVENOUS | Status: AC

## 2023-10-23 MED ORDER — ACETAMINOPHEN 500 MG PO TABS
1000.0000 mg | ORAL_TABLET | Freq: Four times a day (QID) | ORAL | Status: DC
  Administered 2023-10-23 – 2023-10-25 (×6): 1000 mg via ORAL
  Filled 2023-10-23 (×5): qty 2

## 2023-10-23 MED ORDER — ONDANSETRON 4 MG PO TBDP
4.0000 mg | ORAL_TABLET | Freq: Four times a day (QID) | ORAL | Status: DC | PRN
  Administered 2023-10-24: 4 mg via ORAL
  Filled 2023-10-23: qty 1

## 2023-10-23 MED ORDER — MORPHINE SULFATE (PF) 4 MG/ML IV SOLN
4.0000 mg | Freq: Once | INTRAVENOUS | Status: AC
  Administered 2023-10-23: 4 mg via INTRAVENOUS
  Filled 2023-10-23: qty 1

## 2023-10-23 SURGICAL SUPPLY — 40 items
BAG PRESSURE INF REUSE 3000 (BAG) IMPLANT
CLIP LIGATING HEM O LOK PURPLE (MISCELLANEOUS) ×2 IMPLANT
COVER TIP SHEARS 8 DVNC (MISCELLANEOUS) ×2 IMPLANT
DEFOGGER SCOPE WARM SEASHARP (MISCELLANEOUS) ×2 IMPLANT
DERMABOND ADVANCED .7 DNX12 (GAUZE/BANDAGES/DRESSINGS) ×2 IMPLANT
DRAPE ARM DVNC X/XI (DISPOSABLE) ×8 IMPLANT
DRAPE COLUMN DVNC XI (DISPOSABLE) ×2 IMPLANT
ELECTRODE REM PT RTRN 9FT ADLT (ELECTROSURGICAL) ×2 IMPLANT
FORCEPS BPLR R/ABLATION 8 DVNC (INSTRUMENTS) ×2 IMPLANT
FORCEPS PROGRASP DVNC XI (FORCEP) ×2 IMPLANT
GLOVE ORTHO TXT STRL SZ7.5 (GLOVE) ×4 IMPLANT
GOWN STRL REUS W/ TWL LRG LVL3 (GOWN DISPOSABLE) ×4 IMPLANT
GOWN STRL REUS W/ TWL XL LVL3 (GOWN DISPOSABLE) ×4 IMPLANT
GRASPER SUT TROCAR 14GX15 (MISCELLANEOUS) ×2 IMPLANT
IRRIGATION STRYKERFLOW (MISCELLANEOUS) IMPLANT
IRRIGATOR SUCT 8 DISP DVNC XI (IRRIGATION / IRRIGATOR) IMPLANT
KIT PINK PAD W/HEAD ARM REST (MISCELLANEOUS) ×2 IMPLANT
LABEL OR SOLS (LABEL) ×2 IMPLANT
MANIFOLD NEPTUNE II (INSTRUMENTS) ×2 IMPLANT
NDL HYPO 22X1.5 SAFETY MO (MISCELLANEOUS) ×2 IMPLANT
NDL INSUFFLATION 14GA 120MM (NEEDLE) ×2 IMPLANT
NEEDLE HYPO 22X1.5 SAFETY MO (MISCELLANEOUS) ×1 IMPLANT
NEEDLE INSUFFLATION 14GA 120MM (NEEDLE) ×1 IMPLANT
NS IRRIG 500ML POUR BTL (IV SOLUTION) ×2 IMPLANT
PACK LAP CHOLECYSTECTOMY (MISCELLANEOUS) ×2 IMPLANT
SCISSORS MNPLR CVD DVNC XI (INSTRUMENTS) ×2 IMPLANT
SEAL UNIV 5-12 XI (MISCELLANEOUS) ×8 IMPLANT
SET TUBE SMOKE EVAC HIGH FLOW (TUBING) ×2 IMPLANT
SOL .9 NS 3000ML IRR UROMATIC (IV SOLUTION) IMPLANT
SOLN 0.9% NACL 1000 ML (IV SOLUTION) ×1 IMPLANT
SOLN 0.9% NACL POUR BTL 1000ML (IV SOLUTION) IMPLANT
SOLUTION ELECTROSURG ANTI STCK (MISCELLANEOUS) ×2 IMPLANT
SPIKE FLUID TRANSFER (MISCELLANEOUS) ×2 IMPLANT
SUT VICRYL 0 UR6 27IN ABS (SUTURE) ×2 IMPLANT
SUTURE MNCRL 4-0 27XMF (SUTURE) ×2 IMPLANT
SYRINGE TOOMEY IRRIG 70ML (MISCELLANEOUS) IMPLANT
SYSTEM BAG RETRIEVAL 10MM (BASKET) ×2 IMPLANT
TRAP FLUID SMOKE EVACUATOR (MISCELLANEOUS) ×2 IMPLANT
TROCAR Z-THREAD FIOS 11X100 BL (TROCAR) ×2 IMPLANT
WATER STERILE IRR 500ML POUR (IV SOLUTION) ×2 IMPLANT

## 2023-10-23 NOTE — Interval H&P Note (Signed)
 History and Physical Interval Note:  10/23/2023 3:35 PM  Danielle [name]  has presented today for surgery, with the diagnosis of acute calculus cholecystitis.  The various methods of treatment have been discussed with the patient and family. After consideration of risks, benefits and other options for treatment, the patient has consented to  Procedure(s): CHOLECYSTECTOMY, ROBOT-ASSISTED, LAPAROSCOPIC (N/A) as a surgical intervention.  The patient's history has been reviewed, patient examined, no change in status, stable for surgery.  I have reviewed the patient's chart and labs.  Questions were answered to the patient's satisfaction.     Honor Leghorn

## 2023-10-23 NOTE — Consult Note (Addendum)
 History and Physical    Danielle [name] FMW:[id number] DOB: 1975-09-03 DOA: 10/23/2023  DOS: the patient was seen and examined on 10/23/2023  PCP: Donal Channing SQUIBB, FNP   Patient coming from: Home  I have personally briefly reviewed patient's old medical records in [facility]  Chief Complaint: Abdominal pain  HPI: Danielle [name] is a pleasant 48 y.o. 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  Patient stated that she has a right upper quadrant abdominal pain which was 7/10 in intensity started around 2:30 AM this morning.  Pain is there since it is started this morning, sharp, radiating to the back, relieved by pain medications in the ED.  She denies any fever, chills, vomiting, diarrhea.  However she complains some nausea.  Denies any hematemesis or melena  ED Course: Upon arrival to the ED, patient is found to be leukopenic at 3.4, hemoglobin 10.5, platelet 166, creatinine 0.78, bilirubin 2.2 which appears to be baseline.  She had a lactate of 3.9 improved to 2.1.  She did have a RUQ ultrasound concerning for cholelithiasis.  MRCP was done which showed negative for choledocholithiasis.  General surgery consult was done in the emergency room.  Surgical team admitting the patient for laparoscopic cholecystectomy.  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  Review of Systems:  ROS  All other systems negative except as noted in the HPI.  Past Medical History:  Diagnosis Date   Anemia    Depression    Hepatic steatosis 04/01/2023    Past Surgical History:  Procedure Laterality Date   ABDOMINAL HYSTERECTOMY     COLONOSCOPY WITH PROPOFOL  N/A 05/12/2022   Procedure: COLONOSCOPY WITH  PROPOFOL ;  Surgeon: Jinny Carmine, MD;  Location: [facility] ENDOSCOPY;  Service: Endoscopy;  Laterality: N/A;   ESOPHAGOGASTRODUODENOSCOPY N/A 04/08/2023   Procedure: EGD (ESOPHAGOGASTRODUODENOSCOPY);  Surgeon: Toledo, Ladell POUR, MD;  Location: [facility] ENDOSCOPY;  Service: Gastroenterology;  Laterality: N/A;   ESOPHAGOGASTRODUODENOSCOPY (EGD) WITH PROPOFOL  N/A 05/12/2022   Procedure: ESOPHAGOGASTRODUODENOSCOPY (EGD) WITH PROPOFOL ;  Surgeon: Jinny Carmine, MD;  Location: [facility] ENDOSCOPY;  Service: Endoscopy;  Laterality: N/A;   IR BONE MARROW BIOPSY & ASPIRATION  05/28/2022   TONSILLECTOMY       reports that she quit smoking about 17 months ago. Her smoking use included cigarettes. She started smoking about 26 years ago. She has a 12.5 pack-year smoking history. She has never used smokeless tobacco. She reports that she does not currently use alcohol. She reports that she does not use drugs.  Allergies  Allergen Reactions   Dilaudid  [Hydromorphone  Hcl] Anxiety   Rituxan  [Rituximab ] Anaphylaxis   Solu-Medrol  [Methylprednisolone ] Anaphylaxis    Resolved after EpiPen  on 04/02/2023 (After Solu-medrol  250 mg iv, but no reaction after Solu-medrol  100 mg on 04/01/23 weird)    Family History  Problem Relation Age of Onset   Breast cancer Paternal Aunt     Prior to Admission medications   Medication Sig Start Date End Date Taking? Authorizing Provider  acetaminophen  (TYLENOL ) 500 MG tablet Take 500 mg by mouth every 6 (six) hours as needed.   Yes [provider]  albuterol  (VENTOLIN  HFA) 108 (90 Base) MCG/ACT inhaler Inhale 2 puffs into the lungs every 6 (six) hours as needed for  wheezing or shortness of breath. 06/10/22   Brahmanday, Govinda R, MD  diphenoxylate -atropine  (LOMOTIL ) 2.5-0.025 MG tablet Take 2 tablets by mouth 4 (four) times daily as needed for diarrhea or loose stools (diarrhea unrelieved by imodium). 07/17/23   Dasie Tinnie MATSU, NP  folic acid  (FOLVITE ) 1 MG tablet Take 1 tablet (1 mg total)  by mouth daily. 11/07/22   Brahmanday, Govinda R, MD  pantoprazole  (PROTONIX ) 40 MG tablet Take 1 tablet (40 mg total) by mouth daily. Do not stop medication abruptly. 10/08/23   Dasie Tinnie MATSU, NP  polyethylene glycol powder (GLYCOLAX /MIRALAX ) 17 GM/SCOOP powder Take 17 g by mouth daily. Dissolve 1 capful (17g) in 4-8 ounces of liquid and take by mouth daily.    [provider]  predniSONE  (DELTASONE ) 20 MG tablet Take 5 tablets (100 mg total) by mouth daily with breakfast. Do not stop until recommended. 10/08/23 11/07/23  Dasie Tinnie MATSU, NP  Semaglutide-Weight Management (WEGOVY) 0.25 MG/0.5ML SOAJ Inject 0.25 mg into the skin once a week.    [provider]  venlafaxine XR (EFFEXOR-XR) 37.5 MG 24 hr capsule Take 37.5 mg by mouth daily. 08/07/22   [provider]    Physical Exam: Vitals:   10/23/23 0600 10/23/23 0815 10/23/23 0818 10/23/23 1037  BP: 103/74 117/65  100/66  Pulse: 90 95  76  Resp: 18 19  15   Temp: 98 F (36.7 C)  97.6 F (36.4 C) 98.3 F (36.8 C)  TempSrc: Oral  Oral Oral  SpO2: 99% 100%    Weight:      Height:        Physical Exam   Constitutional: Alert, awake, calm, comfortable HEENT: Neck supple Respiratory: Clear to auscultation B/L, no wheezing, no rales.  Cardiovascular: Regular rate and rhythm, no murmurs / rubs / gallops. No extremity edema. 2+ pedal pulses. No carotid bruits.  Abdomen: Soft, mild to moderate RUQ tenderness, Bowel sounds positive.  Musculoskeletal: no clubbing / cyanosis. Good ROM, no contractures. Normal muscle tone.  Skin: no rashes, lesions, ulcers. Neurologic: CN 2-12 grossly intact. Sensation intact, No focal deficit identified Psychiatric: Alert and oriented x 3. Normal mood.    Labs on Admission: I have personally reviewed following labs and imaging studies  CBC: Recent Labs  Lab 10/22/23 0928 10/23/23 0420  WBC 3.4* 6.9  NEUTROABS 2.1 5.1  HGB 10.3* 10.5*  HCT 30.0* 30.8*  MCV 102.0* 103.4*   PLT 163 166   Basic Metabolic Panel: Recent Labs  Lab 10/23/23 0420  NA 142  K 3.8  CL 107  CO2 23  GLUCOSE 119*  BUN 14  CREATININE 0.78  CALCIUM 8.9   GFR: Estimated Creatinine Clearance: 95.5 mL/min (by C-G formula based on SCr of 0.78 mg/dL). Liver Function Tests: Recent Labs  Lab 10/23/23 0420  AST 143*  ALT 62*  ALKPHOS 58  BILITOT 2.2*  PROT 7.0  ALBUMIN 4.1   Recent Labs  Lab 10/23/23 0420  LIPASE 76*   No results for input(s): AMMONIA in the last 168 hours. Coagulation Profile: No results for input(s): INR, PROTIME in the last 168 hours. Cardiac Enzymes: No results for input(s): CKTOTAL, CKMB, CKMBINDEX, TROPONINI, TROPONINIHS in the last 168 hours. BNP (last 3 results) No results for input(s): BNP in the last 8760 hours. HbA1C: No results for input(s): HGBA1C in the last 72 hours. CBG: No results for input(s): GLUCAP in the last 168 hours. Lipid Profile: No results for input(s): CHOL, HDL, LDLCALC, TRIG, CHOLHDL, LDLDIRECT in the  last 72 hours. Thyroid Function Tests: No results for input(s): TSH, T4TOTAL, FREET4, T3FREE, THYROIDAB in the last 72 hours. Anemia Panel: Recent Labs    10/22/23 0928  RETICCTPCT 8.0*   Urine analysis:    Component Value Date/Time   COLORURINE YELLOW (A) 05/11/2022 0232   APPEARANCEUR CLEAR (A) 05/11/2022 0232   LABSPEC 1.024 05/11/2022 0232   PHURINE 5.0 05/11/2022 0232   GLUCOSEU NEGATIVE 05/11/2022 0232   HGBUR SMALL (A) 05/11/2022 0232   BILIRUBINUR NEGATIVE 05/11/2022 0232   KETONESUR NEGATIVE 05/11/2022 0232   PROTEINUR NEGATIVE 05/11/2022 0232   UROBILINOGEN 0.2 08/28/2008 0014   NITRITE NEGATIVE 05/11/2022 0232   LEUKOCYTESUR NEGATIVE 05/11/2022 0232    Radiological Exams on Admission: I have personally reviewed images MR ABDOMEN MRCP W WO CONTAST Result Date: 10/23/2023 CLINICAL DATA:  Right upper quadrant abdominal pain associated with nausea EXAM: MRI  ABDOMEN WITHOUT AND WITH CONTRAST (INCLUDING MRCP) TECHNIQUE: Multiplanar multisequence MR imaging of the abdomen was performed both before and after the administration of intravenous contrast. Heavily T2-weighted images of the biliary and pancreatic ducts were obtained, and three-dimensional MRCP images were rendered by post processing. CONTRAST:  9mL GADAVIST  GADOBUTROL  1 MMOL/ML IV SOLN COMPARISON:  Same day abdominal ultrasound examination, CT abdomen pelvis dated 04/06/2023 FINDINGS: Lower chest: No acute findings. Hepatobiliary: No mass or other parenchymal abnormality identified. No bile duct dilation. Cholelithiasis. Pancreas: There is prominent curvature of the main pancreatic duct near the pancreatic head/neck (16:9, 17:5). No main ductal dilation. 1.1 cm cystic focus in communication with the main pancreatic duct in the pancreatic body (14:40, 17:5). Spleen:  Within normal limits in size and appearance. Adrenals/Urinary Tract: No adrenal nodules. No suspicious renal masses identified. No evidence of hydronephrosis. Stomach/Bowel: Visualized portions within the abdomen are unremarkable. Vascular/Lymphatic: No pathologically enlarged lymph nodes identified. No abdominal aortic aneurysm demonstrated. Other:  None. Musculoskeletal: No suspicious bone lesions identified. IMPRESSION: 1. Cholelithiasis without evidence of acute cholecystitis. 2. No bile duct dilation or choledocholithiasis. 3. 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 4. A 1.1 cm cystic focus in communication with the main pancreatic duct in the pancreatic body, likely a side branch IPMN. Recommend follow-up contrast-enhanced MRI/MRCP in 1 year. Electronically Signed   By: Limin  Xu M.D.   On: 10/23/2023 08:40   US  Abdomen Limited RUQ (LIVER/GB) Result Date: 10/23/2023 CLINICAL DATA:  Initial evaluation for acute right upper quadrant pain. EXAM: ULTRASOUND ABDOMEN LIMITED RIGHT UPPER  QUADRANT COMPARISON:  Prior ultrasound from 04/08/2023 FINDINGS: Gallbladder: Sludge with a few scattered superimposed small stones seen within the gallbladder lumen. Largest discrete stone measures 7 mm. Gallbladder wall measures within normal limits at 2.4 mm in thickness. No free pericholecystic fluid. No sonographic Murphy sign elicited on exam. Common bile duct: Diameter: 5.9 mm Liver: No focal lesion identified. Within normal limits in parenchymal echogenicity. Portal vein is patent on color Doppler imaging with normal direction of blood flow towards the liver. Other: None. IMPRESSION: 1. Stones and sludge within the gallbladder lumen, but with no sonographic features to suggest acute cholecystitis. 2. No biliary dilatation. Electronically Signed   By: Morene Hoard M.D.   On: 10/23/2023 05:50    EKG: N/A    Assessment/Plan Principal Problem:   Acute cholecystitis Active Problems:   Autoimmune hemolytic anemia, unspecified (HCC)   Gastroesophageal reflux disease without esophagitis   Calculus of gallbladder with acute cholecystitis without obstruction    Assessment and Plan: 48 year old female who has a  history of autoimmune hemolytic anemia on high-dose steroid for the last 1 month or so, GERD, who presented to ED at [facility] today for right upper quadrant abdominal pain due to acute cholecystitis.  1.  Acute cholecystitis - Patient is admitted under surgical service. - Plan per primary team  2.  Autoimmune hemolytic anemia on high-dose steroid - Will hold her prednisone  - Will give her stress dose hydrocortisone  100 mg IV BID for 2 days -She has a history of anaphylaxis with Solu-Medrol , I am not sure if that was true.  She is taking prednisone  100 mg daily.  Will have to watch and be careful if she has any of those symptoms. - Will place her on Protonix  due to high-dose steroid - Continue to monitor hemoglobin hematocrit - She will have a increased risk of infection.  Need to  give prophylactic antibiotics perioperatively.  I see she is already on Zosyn .  I agree with that. - Type and cross and prepare 2 units of PRBC - DVT prophylaxis after surgery as they are prone to DVT due to high-dose steroid - I did discuss with Dr. Amey, the patient's primary hematologist and he was agreeable with the plan.   Thank you for the opportunity to be involved in the care of this patient.   Nena Rebel, MD [facility] 10/23/2023, 12:16 PM

## 2023-10-23 NOTE — Anesthesia Postprocedure Evaluation (Signed)
 Anesthesia Post Note  Patient: Danielle [name]  Procedure(s) Performed: CHOLECYSTECTOMY, ROBOT-ASSISTED, LAPAROSCOPIC  Patient location during evaluation: PACU Anesthesia Type: General Level of consciousness: awake and alert Pain management: pain level controlled Vital Signs Assessment: post-procedure vital signs reviewed and stable Respiratory status: spontaneous breathing, nonlabored ventilation and respiratory function stable Cardiovascular status: blood pressure returned to baseline and stable Postop Assessment: no apparent nausea or vomiting Anesthetic complications: no   No notable events documented.   Last Vitals:  Vitals:   10/23/23 1750 10/23/23 1800  BP:  115/63  Pulse:  94  Resp: 19 (!) 22  Temp:  37 C  SpO2:  100%    Last Pain:  Vitals:   10/23/23 1800  TempSrc:   PainSc: Asleep                 Camellia Merilee Louder

## 2023-10-23 NOTE — Plan of Care (Signed)

## 2023-10-23 NOTE — Op Note (Signed)
 Robotic cholecystectomy with Indocyamine Green Ductal Imaging.   Pre-operative Diagnosis: Acute calculus cholecystitis  Post-operative Diagnosis:  Same.  Procedure: Robotic assisted laparoscopic cholecystectomy with Indocyamine Green Ductal Imaging.   Surgeon: Honor Leghorn, M.D., FACS  Anesthesia: General. with endotracheal tube  Findings: As expected  Estimated Blood Loss: 15 mL         Drains: None         Specimens: Gallbladder           Complications: none  Procedure Details  The patient was seen again in the Holding Room.  1.25 mg dose of ICG was administered intravenously.   The benefits, complications, treatment options, risks and expected outcomes were again reviewed with the patient. The likelihood of improving the patient's symptoms with return to their baseline status is good.  The patient and/or family concurred with the proposed plan, giving informed consent, again alternatives reviewed.  The patient was taken to Operating Room, identified, and the procedure verified as robotic assisted laparoscopic cholecystectomy.  Prior to the induction of general anesthesia, antibiotic prophylaxis was administered. VTE prophylaxis was in place. General endotracheal anesthesia was then administered and tolerated well. The patient was positioned in the supine position.  After the induction, the abdomen was prepped with Chloraprep and draped in the sterile fashion.  A Time Out was held and the above information confirmed.  After local infiltration of quarter percent Marcaine  with epinephrine , stab incision was made left upper quadrant.  Just below the costal margin at Palmer's point, approximately midclavicular line the Veres needle is passed with sensation of the layers to penetrate the abdominal wall and into the peritoneum.  Saline drop test is confirmed peritoneal placement.  Insufflation is initiated with carbon dioxide to pressures of 15 mmHg.  Local infiltration with 0.25%  Marcaine  with epinephrine  is utilized for all skin incisions.  Made a 12 mm incision on the right periumbilical site, I advanced an optical 11mm port under direct visualization into the peritoneal cavity.  Once the peritoneum was penetrated, insufflation was initiated.  The trocar was then advanced into the abdominal cavity under direct visualization. Pneumoperitoneum was then continued utilizing CO2 at 15 mmHg or less and tolerated well without any adverse changes in the patient's vital signs.  Two 8.5-mm ports were placed in the left lower quadrant and laterally, and one to the right lower quadrant, all under direct vision. Local infiltration with a mixture of Exparel  and 0.25% Marcaine  with epinephrine  is utilized for all port sites with deep infiltration under visualization.   The patient was positioned  in reverse Trendelenburg, tilted the patient's left side down.  Da Vinci XI robot was then positioned on to the patient's left side, and docked.  The gallbladder was identified, the fundus grasped via the arm 4 Prograsp and retracted cephalad. Adhesions were lysed with scissors and cautery.  The infundibulum was identified grasped and retracted laterally, exposing the peritoneum overlying the triangle of Calot. This was then opened and dissected using cautery & scissors.  We did achieve the critical view of safety with 2 and only 2 structures entering the gallbladder and we were able to see the liver plate posterior to this from both sides;with the extended critical view of the cystic duct and cystic artery obtained, aided by the ICG via FireFly which improved localization of the major ductal anatomy.    The cystic duct was clearly identified and dissected to isolation.   Artery well isolated and clipped, and the cystic duct was triple  clipped and divided with scissors, as close to the gallbladder neck as feasible, thus leaving two on the remaining stump.  The specimen side of the artery is sealed with  bipolar and divided with monopolar scissors.   The gallbladder was taken from the gallbladder fossa in a retrograde fashion with the electrocautery. The gallbladder was removed and placed in an Endocatch bag.  The liver bed is inspected. Hemostasis was confirmed.  The robot was undocked and moved away from the operative field. 2 L normal saline irrigation was utilized and was aspirated clear.  The gallbladder and Endocatch sac were then removed through the paraumbilical port site.   Inspection of the right upper quadrant was performed. No bleeding, bile duct injury or leak, or bowel injury was noted. The infra-umbilical port site fascia was closed with interrumpted 0 Vicryl sutures using PMI/cone under direct visualization. Pneumoperitoneum was released and ports removed.  4-0 subcuticular Monocryl was used to close the skin. Dermabond was  applied.  The patient was then extubated and brought to the recovery room in stable condition. Sponge, lap, and needle counts were correct at closure and at the conclusion of the case.               Honor Leghorn, M.D., [facility] 10/23/2023 6:17 PM

## 2023-10-23 NOTE — ED Provider Notes (Signed)
 MRCP with cholelithiasis without signs of cholecystitis or choledocholithiasis.  She still has significant pain despite multiple doses of IV narcotics.  I consult with general surgery who sees the patient and agrees with cholecystectomy.  They also requested I consult with medicine to see the patient in consultation due to her coexisting medical history.  I discussed with medicine on the phone who agrees to consult.  Admit to surgery   Claudene Rover, MD 10/23/23 1140

## 2023-10-23 NOTE — Anesthesia Preprocedure Evaluation (Signed)
 Anesthesia Evaluation  Patient identified by MRN, date of birth, ID band Patient awake    Reviewed: Allergy & Precautions, NPO status , Patient's Chart, lab work & pertinent test results  History of Anesthesia Complications Negative for: history of anesthetic complications  Airway Mallampati: III  TM Distance: >3 FB Neck ROM: full    Dental  (+) Chipped   Pulmonary neg shortness of breath, former smoker   Pulmonary exam normal        Cardiovascular Exercise Tolerance: Good (-) angina (-) Past MI negative cardio ROS Normal cardiovascular exam     Neuro/Psych  PSYCHIATRIC DISORDERS       Neuromuscular disease    GI/Hepatic Neg liver ROS,GERD  Controlled,,  Endo/Other  negative endocrine ROS    Renal/GU      Musculoskeletal   Abdominal   Peds  Hematology negative hematology ROS (+)   Anesthesia Other Findings Past Medical History: No date: Anemia No date: Depression 04/01/2023: Hepatic steatosis  Past Surgical History: No date: ABDOMINAL HYSTERECTOMY 05/12/2022: COLONOSCOPY WITH PROPOFOL ; N/A     Comment:  Procedure: COLONOSCOPY WITH PROPOFOL ;  Surgeon: Jinny Carmine, MD;  Location: [facility] ENDOSCOPY;  Service:               Endoscopy;  Laterality: N/A; 04/08/2023: ESOPHAGOGASTRODUODENOSCOPY; N/A     Comment:  Procedure: EGD (ESOPHAGOGASTRODUODENOSCOPY);  Surgeon:               Toledo, Ladell POUR, MD;  Location: [facility] ENDOSCOPY;                Service: Gastroenterology;  Laterality: N/A; 05/12/2022: ESOPHAGOGASTRODUODENOSCOPY (EGD) WITH PROPOFOL ; N/A     Comment:  Procedure: ESOPHAGOGASTRODUODENOSCOPY (EGD) WITH               PROPOFOL ;  Surgeon: Jinny Carmine, MD;  Location: [facility]               ENDOSCOPY;  Service: Endoscopy;  Laterality: N/A; 05/28/2022: IR BONE MARROW BIOPSY & ASPIRATION No date: TONSILLECTOMY  BMI    Body Mass Index: 37.20 kg/m      Reproductive/Obstetrics negative OB ROS                               Anesthesia Physical Anesthesia Plan  ASA: 2  Anesthesia Plan: General ETT   Post-op Pain Management:    Induction: Intravenous  PONV Risk Score and Plan: Ondansetron , Dexamethasone , Midazolam  and Treatment may vary due to age or medical condition  Airway Management Planned: Oral ETT  Additional Equipment:   Intra-op Plan:   Post-operative Plan: Extubation in OR  Informed Consent: I have reviewed the patients History and Physical, chart, labs and discussed the procedure including the risks, benefits and alternatives for the proposed anesthesia with the patient or authorized representative who has indicated his/her understanding and acceptance.     Dental Advisory Given  Plan Discussed with: Anesthesiologist, CRNA and Surgeon  Anesthesia Plan Comments: (Patient consented for risks of anesthesia including but not limited to:  - adverse reactions to medications - damage to eyes, teeth, lips or other oral mucosa - nerve damage due to positioning  - sore throat or hoarseness - Damage to heart, brain, nerves, lungs, other parts of body or loss of life  Patient voiced understanding and assent.)        Anesthesia Quick Evaluation

## 2023-10-23 NOTE — Transfer of Care (Signed)
 Immediate Anesthesia Transfer of Care Note  Patient: Danielle [name]  Procedure(s) Performed: CHOLECYSTECTOMY, ROBOT-ASSISTED, LAPAROSCOPIC  Patient Location: PACU  Anesthesia Type:General  Level of Consciousness: awake, alert , and oriented  Airway & Oxygen Therapy: Patient Spontanous Breathing and Patient connected to face mask oxygen  Post-op Assessment: Report given to RN and Post -op Vital signs reviewed and stable  Post vital signs: Reviewed and stable  Last Vitals:  Vitals Value Taken Time  BP 112/54 10/23/2023 1717  Temp    Pulse 92   Resp 17 10/23/23 17:16  SpO2 98   Vitals shown include unfiled device data.  Last Pain:  Vitals:   10/23/23 1433  TempSrc: Temporal  PainSc: 4          Complications: No notable events documented.

## 2023-10-23 NOTE — Anesthesia Procedure Notes (Signed)
 Procedure Name: Intubation Date/Time: 10/23/2023 3:59 PM  Performed by: Lacretia Camelia NOVAK, CRNAPre-anesthesia Checklist: Patient identified, Emergency Drugs available, Suction available and Patient being monitored Patient Re-evaluated:Patient Re-evaluated prior to induction Oxygen Delivery Method: Circle system utilized Preoxygenation: Pre-oxygenation with 100% oxygen Induction Type: IV induction and Rapid sequence Laryngoscope Size: McGrath and 3 Grade View: Grade I Tube type: Oral Number of attempts: 1 Airway Equipment and Method: Stylet and Video-laryngoscopy Placement Confirmation: ETT inserted through vocal cords under direct vision, positive ETCO2 and breath sounds checked- equal and bilateral Secured at: 21 cm Tube secured with: Tape Dental Injury: Teeth and Oropharynx as per pre-operative assessment

## 2023-10-23 NOTE — H&P (Signed)
 [facility] SURGICAL HISTORY & PHYSICAL (cpt [phone])  HISTORY OF PRESENT ILLNESS (HPI):  48 y.o. female presented to [facility] ED today for abdominal pain. Patient reports the acute onset of severe RUQ and epigastric abdominal pain around 0230 this AM. She has ahd no relief from this since the onset. No fever, chills. She does endorse nausea and emesis with the pain. No known history of gallbladder disease in the past. Previous abdominal surgeries positive for hysterectomy and C-section. 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 Work up in the ED revealed leukopenia to 3.4 (now 6.9K), Hgb to 10.5; stable, PLT 166, sCr - 0.78, bilirubin 2.2 but this is baseline, initial venous lactate 3.9 (now 2.1). She did have RUQ US  concerning for cholelithiasis. MRCP was also obtained and negative for choledocholithiasis. She continues to have intractable abdominal pain.  General surgery is consulted by emergency medicine physician Dr Ester Sharps, MD for evaluation and management of acute cholecystitis  PAST MEDICAL HISTORY (PMH):  Past Medical History:  Diagnosis Date   Anemia    Depression    Hepatic steatosis 04/01/2023    Reviewed. Otherwise negative.   PAST SURGICAL HISTORY (PSH):  Past Surgical History:  Procedure Laterality Date   ABDOMINAL HYSTERECTOMY     COLONOSCOPY WITH PROPOFOL  N/A 05/12/2022   Procedure: COLONOSCOPY WITH PROPOFOL ;  Surgeon: Jinny Carmine, MD;  Location: [facility] ENDOSCOPY;  Service: Endoscopy;  Laterality: N/A;   ESOPHAGOGASTRODUODENOSCOPY N/A 04/08/2023   Procedure: EGD (ESOPHAGOGASTRODUODENOSCOPY);  Surgeon: Toledo, Ladell POUR, MD;  Location: [facility] ENDOSCOPY;  Service: Gastroenterology;  Laterality: N/A;   ESOPHAGOGASTRODUODENOSCOPY (EGD) WITH PROPOFOL  N/A 05/12/2022   Procedure: ESOPHAGOGASTRODUODENOSCOPY (EGD) WITH PROPOFOL ;  Surgeon: Jinny Carmine, MD;  Location: [facility] ENDOSCOPY;  Service: Endoscopy;  Laterality:  N/A;   IR BONE MARROW BIOPSY & ASPIRATION  05/28/2022   TONSILLECTOMY      Reviewed. Otherwise negative.   MEDICATIONS:  Prior to Admission medications   Medication Sig Start Date End Date Taking? Authorizing Provider  acetaminophen  (TYLENOL ) 500 MG tablet Take 500 mg by mouth every 6 (six) hours as needed.    [provider]  albuterol  (VENTOLIN  HFA) 108 (90 Base) MCG/ACT inhaler Inhale 2 puffs into the lungs every 6 (six) hours as needed for wheezing or shortness of breath. 06/10/22   Brahmanday, Govinda R, MD  diphenoxylate -atropine  (LOMOTIL ) 2.5-0.025 MG tablet Take 2 tablets by mouth 4 (four) times daily as needed for diarrhea or loose stools (diarrhea unrelieved by imodium). 07/17/23   Dasie Tinnie MATSU, NP  folic acid  (FOLVITE ) 1 MG tablet Take 1 tablet (1 mg total) by mouth daily. 11/07/22   Brahmanday, Govinda R, MD  pantoprazole  (PROTONIX ) 40 MG tablet Take 1 tablet (40 mg total) by mouth daily. Do not stop medication abruptly. 10/08/23   Dasie Tinnie MATSU, NP  polyethylene glycol powder (GLYCOLAX /MIRALAX ) 17 GM/SCOOP powder Take 17 g by mouth daily. Dissolve 1 capful (17g) in 4-8 ounces of liquid and take by mouth daily.    [provider]  predniSONE  (DELTASONE ) 20 MG tablet Take 5 tablets (100 mg total) by mouth daily with breakfast. Do not stop until recommended. 10/08/23 11/07/23  Dasie Tinnie MATSU, NP  Semaglutide-Weight Management (WEGOVY) 0.25 MG/0.5ML SOAJ Inject 0.25 mg into the skin once a week.    [provider]  venlafaxine XR (EFFEXOR-XR) 37.5 MG 24 hr capsule Take 37.5 mg by mouth daily. 08/07/22   [provider]  ALLERGIES:  Allergies  Allergen Reactions   Dilaudid  [Hydromorphone  Hcl] Anxiety   Rituxan  [Rituximab ] Anaphylaxis   Solu-Medrol  [Methylprednisolone ] Anaphylaxis    Resolved after EpiPen  on 04/02/2023 (After Solu-medrol  250 mg iv, but no reaction after Solu-medrol  100 mg on 04/01/23 weird)     SOCIAL HISTORY:  Social History    Socioeconomic History   Marital status: Single    Spouse name: Not on file   Number of children: Not on file   Years of education: Not on file   Highest education level: Not on file  Occupational History   Not on file  Tobacco Use   Smoking status: Former    Current packs/day: 0.00    Average packs/day: 0.5 packs/day for 25.0 years (12.5 ttl pk-yrs)    Types: Cigarettes    Start date: 05/09/1997    Quit date: 05/10/2022    Years since quitting: 1.4   Smokeless tobacco: Never  Vaping Use   Vaping status: Never Used  Substance and Sexual Activity   Alcohol use: Not Currently   Drug use: Never   Sexual activity: Not Currently  Other Topics Concern   Not on file  Social History Narrative   Lives in [place] with 4 kids; stay at home [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    Social Drivers of Corporate investment banker Strain: Not on file  Food Insecurity: Food Insecurity Present (06/18/2023)   Received from [facility]   Hunger Vital Sign    Within the past 12 months, you worried that your food would run out before you got the money to buy more.: Sometimes true    Within the past 12 months, the food you bought just didn't last and you didn't have money to get more.: Sometimes true  Transportation Needs: No Transportation Needs (06/18/2023)   Received from [facility] - Transportation    In the past 12 months, has lack of transportation kept you from medical appointments or from getting medications?: No    Lack of Transportation (Non-Medical): No  Physical Activity: Not on file  Stress: Not on file  Social Connections: Unknown (04/01/2023)   Social Connection and Isolation Panel    Frequency of Communication with Friends and Family: More than three times a week    Frequency of Social Gatherings with Friends and Family: More than three times a week    Attends Religious Services: More than 4 times per year    Active  Member of [employer] or Organizations: No    Attends Banker Meetings: Never    Marital Status: Patient declined  Catering manager Violence: Not At Risk (04/01/2023)   Humiliation, Afraid, Rape, and Kick questionnaire    Fear of Current or Ex-Partner: No    Emotionally Abused: No    Physically Abused: No    Sexually Abused: No     FAMILY HISTORY:  Family History  Problem Relation Age of Onset   Breast cancer Paternal Aunt     Otherwise negative.   REVIEW OF SYSTEMS:  Review of Systems  Constitutional:  Negative for chills and fever.  Respiratory:  Negative for cough and shortness of breath.   Cardiovascular:  Negative for chest pain and palpitations.  Gastrointestinal:  Positive for abdominal pain, nausea and vomiting. Negative for constipation and diarrhea.  Genitourinary:  Negative for dysuria and urgency.  All other systems reviewed and are negative.   VITAL SIGNS:  Temp:  [97.6 F (  36.4 C)-98.3 F (36.8 C)] 97.6 F (36.4 C) (09/26 0818) Pulse Rate:  [64-95] 95 (09/26 0815) Resp:  [18-19] 19 (09/26 0815) BP: (102-117)/(59-74) 117/65 (09/26 0815) SpO2:  [99 %-100 %] 100 % (09/26 0815) Weight:  [95.3 kg] 95.3 kg (09/26 0347)     Height: 5' 3 (160 cm) Weight: 95.3 kg BMI (Calculated): 37.21   PHYSICAL EXAM:  Physical Exam Vitals and nursing note reviewed. Exam conducted with a chaperone present.  Constitutional:      General: She is not in acute distress.    Appearance: She is obese. She is not ill-appearing.     Comments: Resting in bed; appears uncomfortable. Family at bedside   HENT:     Head: Normocephalic and atraumatic.  Eyes:     General: No scleral icterus.    Extraocular Movements: Extraocular movements intact.  Cardiovascular:     Rate and Rhythm: Normal rate.     Heart sounds: Normal heart sounds. No murmur heard. Pulmonary:     Effort: Pulmonary effort is normal. No respiratory distress.  Abdominal:     General: There is no distension.      Palpations: Abdomen is soft.     Tenderness: There is abdominal tenderness in the right upper quadrant and epigastric area. There is no guarding or rebound.  Skin:    General: Skin is warm and dry.     Coloration: Skin is not jaundiced.  Neurological:     General: No focal deficit present.     Mental Status: She is alert and oriented to person, place, and time.  Psychiatric:        Mood and Affect: Mood normal.        Behavior: Behavior normal.     INTAKE/OUTPUT:  This shift: No intake/output data recorded.  Last 2 shifts: @IOLAST2SHIFTS @  Labs:     Latest Ref Rng & Units 10/23/2023    4:20 AM 10/22/2023    9:28 AM 10/15/2023    9:54 AM  CBC  WBC 4.0 - 10.5 K/uL 6.9  3.4  3.6   Hemoglobin 12.0 - 15.0 g/dL 89.4  89.6  7.9   Hematocrit 36.0 - 46.0 % 30.8  30.0  23.4   Platelets 150 - 400 K/uL 166  163  150       Latest Ref Rng & Units 10/23/2023    4:20 AM 10/08/2023    9:38 AM 09/30/2023   10:18 AM  CMP  Glucose 70 - 99 mg/dL 880  74  893   BUN 6 - 20 mg/dL 14  13  15    Creatinine 0.44 - 1.00 mg/dL 9.21  9.46  9.47   Sodium 135 - 145 mmol/L 142  138  136   Potassium 3.5 - 5.1 mmol/L 3.8  3.9  4.2   Chloride 98 - 111 mmol/L 107  104  100   CO2 22 - 32 mmol/L 23  26  26    Calcium 8.9 - 10.3 mg/dL 8.9  8.9  9.3   Total Protein 6.5 - 8.1 g/dL 7.0  6.0  7.0   Total Bilirubin 0.0 - 1.2 mg/dL 2.2  1.8  2.0   Alkaline Phos 38 - 126 U/L 58  42  54   AST 15 - 41 U/L 143  24  86   ALT 0 - 44 U/L 62  20  56      Imaging studies:   RUQ US  (10/23/2023) personally reviewed with cholelithiasis without gross changes of cholecystitis,  and radiologist report reviewed below:   IMPRESSION: 1. Stones and sludge within the gallbladder lumen, but with no sonographic features to suggest acute cholecystitis. 2. No biliary dilatation.   MRCP (10/23/2023) personally reviewed with cholelithiasis without choledocholithiasis, and radiologist report reviewed below:  IMPRESSION: 1.  Cholelithiasis without evidence of acute cholecystitis. 2. No bile duct dilation or choledocholithiasis. 3. 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 4. A 1.1 cm cystic focus in communication with the main pancreatic duct in the pancreatic body, likely a side branch IPMN. Recommend follow-up contrast-enhanced MRI/MRCP in 1 year.    Assessment/Plan:  48 y.o. female with acute cholecystitis, complicated by need for prednisone  100 mg daily    - I would appreciate medicine admission or at minimal consultation given her comorbidities   - We will plan for robotic assisted laparoscopic cholecystectomy this afternoon with Dr Lane pending OR/Anesthesia availability.    - I did have a lengthy discussion with her regarding her increased risk of complication given her relatively significant prednisone  use daily. She understands.   - All risks, benefits, and alternatives to above procedure(s) were discussed with the patient, all of her questions were answered to her expressed satisfaction, patient expresses she wishes to proceed, and informed consent was obtained.   - NPO for planned surgery; IVF support - IV Abx - ICG on call to OR   - Monitor abdominal examination; on-going bowel function   - Pain control prn; antiemetics prn  - Will hold home medications for now; restart post-operatively   All of the above findings and recommendations were discussed with the patient and her family, and all of their questions were answered to their expressed satisfaction.  -- Arthea Platt, PA-C [facility] 10/23/2023, 10:23 AM M-F: 7am - 4pm

## 2023-10-23 NOTE — Progress Notes (Signed)
 CHG done. Patient in bed. NPO. Fluids and IV ABX running. Family at bedside.

## 2023-10-23 NOTE — ED Provider Notes (Signed)
 [facility] Provider Note    Event Date/Time   First MD Initiated Contact with Patient 10/23/23 [phone]     (approximate)   History   Abdominal Pain (C/O sudden onset upper abdominal pain that woke her form sleep tonight.  Pt has had one episode of vomiting, no diarrhea)   HPI  Danielle [name] is a 48 y.o. female with a history of autoimmune hemolytic anemia who presents with right upper quadrant abdominal pain, acute onset within the last couple of hours, severe intensity, and associated with nausea but no diarrhea.  It awoke her from sleep.  The patient denies any prior history of this pain.  She has not had any abdominal surgeries in the past.  I reviewed the past medical records.  The patient was just seen in the hematology clinic yesterday for follow-up of her anemia.  She was being given daily steroid.   Physical Exam   Triage Vital Signs: ED Triage Vitals  Encounter Vitals Group     BP 10/23/23 0345 (!) 102/59     Girls Systolic BP Percentile --      Girls Diastolic BP Percentile --      Boys Systolic BP Percentile --      Boys Diastolic BP Percentile --      Pulse Rate 10/23/23 0345 64     Resp --      Temp 10/23/23 0345 98.3 F (36.8 C)     Temp Source 10/23/23 0345 Oral     SpO2 10/23/23 0340 100 %     Weight 10/23/23 0347 210 lb (95.3 kg)     Height 10/23/23 0347 5' 3 (1.6 m)     Head Circumference --      Peak Flow --      Pain Score 10/23/23 0346 10     Pain Loc --      Pain Education --      Exclude from Growth Chart --     Most recent vital signs: Vitals:   10/23/23 0340 10/23/23 0345  BP:  (!) 102/59  Pulse:  64  Temp:  98.3 F (36.8 C)  SpO2: 100% 100%     General: Alert, uncomfortable appearing, no distress.  CV:  Good peripheral perfusion.  Resp:  Normal effort.  Abd:  No distention.  Soft with moderate right upper quadrant tenderness. Other:  No jaundice or scleral icterus.   ED Results / Procedures /  Treatments   Labs (all labs ordered are listed, but only abnormal results are displayed) Labs Reviewed  COMPREHENSIVE METABOLIC PANEL WITH GFR - Abnormal; Notable for the following components:      Result Value   Glucose, Bld 119 (*)    AST 143 (*)    ALT 62 (*)    Total Bilirubin 2.2 (*)    All other components within normal limits  CBC WITH DIFFERENTIAL/PLATELET - Abnormal; Notable for the following components:   RBC 2.98 (*)    Hemoglobin 10.5 (*)    HCT 30.8 (*)    MCV 103.4 (*)    MCH 35.2 (*)    All other components within normal limits  LACTIC ACID, PLASMA - Abnormal; Notable for the following components:   Lactic Acid, Venous 3.9 (*)    All other components within normal limits  LACTIC ACID, PLASMA - Abnormal; Notable for the following components:   Lactic Acid, Venous 2.1 (*)    All other components within normal limits  LIPASE, BLOOD  EKG     RADIOLOGY  US  abdomen RUQ: I independently viewed and interpreted the images; there are gallstones and sludge with no wall thickening or CBD dilation.  PROCEDURES:  Critical Care performed: No  Procedures   MEDICATIONS ORDERED IN ED: Medications  iohexol  (OMNIPAQUE ) 300 MG/ML solution 100 mL (has no administration in time range)  ondansetron  (ZOFRAN ) injection 4 mg (4 mg Intravenous Given 10/23/23 0403)  HYDROmorphone  (DILAUDID ) injection 1 mg (1 mg Intravenous Given 10/23/23 0403)  sodium chloride  0.9 % bolus 500 mL (0 mLs Intravenous Stopped 10/23/23 0435)  sodium chloride  0.9 % bolus 500 mL (0 mLs Intravenous Stopped 10/23/23 0605)  morphine  (PF) 4 MG/ML injection 4 mg (4 mg Intravenous Given 10/23/23 9394)     IMPRESSION / MDM / ASSESSMENT AND PLAN / ED COURSE  I reviewed the triage vital signs and the nursing notes.  48 year old female with PMH as noted above presents with right upper quadrant abdominal pain acute onset tonight.  On exam her vital signs are normal.  She has right upper quadrant  tenderness.  Differential diagnosis includes, but is not limited to, biliary colic, acute cholecystitis, choledocholithiasis, pancreatitis, other hepatobiliary cause, gastritis, gastroenteritis, colitis, diverticulitis.  We will obtain lab workup, right upper quadrant ultrasound, give fluids and analgesia and reassess.  If the ultrasound is nondiagnostic patient may need additional imaging.  Patient's presentation is most consistent with acute complicated illness / injury requiring diagnostic workup.  ----------------------------------------- 7:24 AM on 10/23/2023 -----------------------------------------  CMP shows mild LFT and bilirubin elevations.  CBC is unremarkable.  Lipase is still pending.  Ultrasound shows small gallstones and sludge but no obvious signs of acute cholecystitis.  I have ordered a CT as well as an MRCP for further evaluation.  I have signed the patient out to the oncoming ED physician Dr. Claudene.   FINAL CLINICAL IMPRESSION(S) / ED DIAGNOSES   Final diagnoses:  Right upper quadrant abdominal pain     Rx / DC Orders   ED Discharge Orders     None        Note:  This document was prepared using Dragon voice recognition software and may include unintentional dictation errors.    Jacolyn Pae, MD 10/23/23 [phone]

## 2023-10-24 DIAGNOSIS — K810 Acute cholecystitis: Secondary | ICD-10-CM | POA: Diagnosis not present

## 2023-10-24 LAB — COMPREHENSIVE METABOLIC PANEL WITH GFR
ALT: 396 U/L — ABNORMAL HIGH (ref 0–44)
AST: 252 U/L — ABNORMAL HIGH (ref 15–41)
Albumin: 3.8 g/dL (ref 3.5–5.0)
Alkaline Phosphatase: 91 U/L (ref 38–126)
Anion gap: 7 (ref 5–15)
BUN: 11 mg/dL (ref 6–20)
CO2: 24 mmol/L (ref 22–32)
Calcium: 8.8 mg/dL — ABNORMAL LOW (ref 8.9–10.3)
Chloride: 107 mmol/L (ref 98–111)
Creatinine, Ser: 0.61 mg/dL (ref 0.44–1.00)
GFR, Estimated: >60 mL/min (ref >60)
Glucose, Bld: 129 mg/dL — ABNORMAL HIGH (ref 70–99)
Potassium: 3.9 mmol/L (ref 3.5–5.1)
Sodium: 138 mmol/L (ref 135–145)
Total Bilirubin: 3.7 mg/dL — ABNORMAL HIGH (ref 0.0–1.2)
Total Protein: 6.5 g/dL (ref 6.5–8.1)

## 2023-10-24 LAB — CBC
HCT: 27.1 % — ABNORMAL LOW (ref 36.0–46.0)
Hemoglobin: 9.3 g/dL — ABNORMAL LOW (ref 12.0–15.0)
MCH: 35.2 pg — ABNORMAL HIGH (ref 26.0–34.0)
MCHC: 34.3 g/dL (ref 30.0–36.0)
MCV: 102.7 fL — ABNORMAL HIGH (ref 80.0–100.0)
Platelets: 157 K/uL (ref 150–400)
RBC: 2.64 MIL/uL — ABNORMAL LOW (ref 3.87–5.11)
WBC: 4.4 K/uL (ref 4.0–10.5)
nRBC: 0 % (ref 0.0–0.2)

## 2023-10-24 MED ORDER — FOLIC ACID 1 MG PO TABS
1.0000 mg | ORAL_TABLET | Freq: Every day | ORAL | Status: DC
  Administered 2023-10-24 – 2023-10-25 (×2): 1 mg via ORAL
  Filled 2023-10-24 (×2): qty 1

## 2023-10-24 MED ORDER — HYDROXYZINE HCL 10 MG PO TABS
10.0000 mg | ORAL_TABLET | Freq: Three times a day (TID) | ORAL | Status: DC | PRN
  Administered 2023-10-24: 10 mg via ORAL
  Filled 2023-10-24 (×2): qty 1

## 2023-10-24 MED ORDER — MORPHINE SULFATE (PF) 4 MG/ML IV SOLN
4.0000 mg | INTRAVENOUS | Status: DC | PRN
  Administered 2023-10-24 (×3): 4 mg via INTRAVENOUS
  Filled 2023-10-24 (×3): qty 1

## 2023-10-24 MED ORDER — ENOXAPARIN SODIUM 40 MG/0.4ML IJ SOSY
40.0000 mg | PREFILLED_SYRINGE | Freq: Every day | INTRAMUSCULAR | Status: DC
  Administered 2023-10-24 – 2023-10-25 (×2): 40 mg via SUBCUTANEOUS
  Filled 2023-10-24 (×2): qty 0.4

## 2023-10-24 MED ORDER — ALUM & MAG HYDROXIDE-SIMETH 200-200-20 MG/5ML PO SUSP
30.0000 mL | Freq: Four times a day (QID) | ORAL | Status: DC | PRN
  Administered 2023-10-24 – 2023-10-25 (×2): 30 mL via ORAL
  Filled 2023-10-24 (×2): qty 30

## 2023-10-24 MED ORDER — KETOROLAC TROMETHAMINE 30 MG/ML IJ SOLN
30.0000 mg | Freq: Once | INTRAMUSCULAR | Status: AC
Start: 2023-10-24 — End: 2023-10-24
  Administered 2023-10-24: 30 mg via INTRAVENOUS
  Filled 2023-10-24: qty 1

## 2023-10-24 MED ORDER — OXYCODONE HCL 5 MG PO TABS
5.0000 mg | ORAL_TABLET | ORAL | Status: DC | PRN
  Administered 2023-10-24 – 2023-10-25 (×3): 10 mg via ORAL
  Filled 2023-10-24 (×3): qty 2

## 2023-10-24 NOTE — Plan of Care (Signed)

## 2023-10-24 NOTE — Progress Notes (Addendum)
 Progress Note    Danielle [name]  FMW:[id number] DOB: [birth year]  DOA: 10/23/2023 PCP: Donal Channing SQUIBB, FNP      Brief Narrative:    Medical records reviewed and are as summarized below:  Danielle [name] is a 48 y.o. 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  She was found to have acute cholecystitis.  She was admitted to the surgical service and the hospitalist team was consulted to assist with medical management.      Assessment/Plan:   Principal Problem:   Acute cholecystitis Active Problems:   Autoimmune hemolytic anemia, unspecified (HCC)   Gastroesophageal reflux disease without esophagitis   Calculus of gallbladder with acute cholecystitis without obstruction   Right upper quadrant abdominal pain   Body mass index is 36.31 kg/m.  (Class II obesity)   Acute cholecystitis, cholelithiasis, elevated liver enzymes: S/p robotic assisted laparoscopic cholecystectomy on 10/23/2023.  Analgesics as needed for pain.  She is on full liquid diet.  Follow-up with general surgeon   Autoimmune hemolytic anemia: Continue IV hydrocortisone .  Plan to switch to prednisone  on discharge.  Continue Protonix  for GI prophylaxis   Diet Order             Diet full liquid Room service appropriate? Yes; Fluid consistency: Thin  Diet effective now                                  Consultants: General Surgeon  Procedures: Laparoscopic cholecystectomy    Medications:    acetaminophen   1,000 mg Oral Q6H   Chlorhexidine  Gluconate Cloth  6 each Topical Daily   enoxaparin  (LOVENOX ) injection  40 mg Subcutaneous Daily   folic acid   1 mg Oral Daily   hydrocortisone  sod succinate (SOLU-CORTEF ) inj  100 mg Intravenous Q12H   pantoprazole  (PROTONIX ) IV  40 mg Intravenous Q12H   sodium chloride  flush  10-40 mL Intracatheter Q12H    Continuous Infusions:  piperacillin -tazobactam (ZOSYN )  IV 3.375 g (10/24/23 1324)     Anti-infectives (From admission, onward)    Start     Dose/Rate Route Frequency Ordered Stop   10/23/23 1200  piperacillin -tazobactam (ZOSYN ) IVPB 3.375 g        3.375 g 12.5 mL/hr over 240 Minutes Intravenous Every 8 hours 10/23/23 1120 10/30/23 1359              Family Communication/Anticipated D/C date and plan/Code Status   DVT prophylaxis: enoxaparin  (LOVENOX ) injection 40 mg Start: 10/24/23 1200 SCDs Start: 10/23/23 1121     Code Status: Full Code  Family Communication: Daughter at the bedside Disposition Plan: Plan to discharge home         Subjective:   Interval events noted..  She complains of abdominal pain.  Daughter was at the bedside  Objective:    Vitals:   10/23/23 1828 10/23/23 2001 10/24/23 0342 10/24/23 1031  BP: 113/68 (!) 103/54 99/65 107/71  Pulse: 81 89 69 67  Resp: 18 18 18 16   Temp: 98.2 F (36.8 C) 98.6 F (37 C) 98.1 F (36.7 C) 97.7 F (36.5 C)  TempSrc: Oral Oral Oral Oral  SpO2: 100% 99% 100% 100%  Weight:      Height:       No data found.   Intake/Output Summary (Last 24 hours) at 10/24/2023 1429  Last data filed at 10/24/2023 9386 Gross per 24 hour  Intake 1871.09 ml  Output 10 ml  Net 1861.09 ml   Filed Weights   10/23/23 0347 10/23/23 1433  Weight: 95.3 kg 93 kg    Exam:  GEN: NAD SKIN: Warm and dry EYES: No pallor or icterus ENT: MMM CV: RRR PULM: CTA B ABD: soft, obese, nonsurgical tenderness without rebound tenderness or guarding, +BS, multiple small incisions are clean, dry and intact CNS: AAO x 3, non focal EXT: No edema or tenderness        Data Reviewed:   I have personally reviewed following labs and imaging studies:  Labs: Labs show the following:   Basic Metabolic Panel: Recent Labs  Lab 10/23/23 0420 10/24/23 0445  NA 142 138  K 3.8 3.9  CL 107 107  CO2 23 24  GLUCOSE 119* 129*   BUN 14 11  CREATININE 0.78 0.61  CALCIUM 8.9 8.8*   GFR Estimated Creatinine Clearance: 94.1 mL/min (by C-G formula based on SCr of 0.61 mg/dL). Liver Function Tests: Recent Labs  Lab 10/23/23 0420 10/24/23 0445  AST 143* 252*  ALT 62* 396*  ALKPHOS 58 91  BILITOT 2.2* 3.7*  PROT 7.0 6.5  ALBUMIN 4.1 3.8   Recent Labs  Lab 10/23/23 0420  LIPASE 76*   No results for input(s): AMMONIA in the last 168 hours. Coagulation profile No results for input(s): INR, PROTIME in the last 168 hours.  CBC: Recent Labs  Lab 10/22/23 0928 10/23/23 0420 10/24/23 0445  WBC 3.4* 6.9 4.4  NEUTROABS 2.1 5.1  --   HGB 10.3* 10.5* 9.3*  HCT 30.0* 30.8* 27.1*  MCV 102.0* 103.4* 102.7*  PLT 163 166 157   Cardiac Enzymes: No results for input(s): CKTOTAL, CKMB, CKMBINDEX, TROPONINI in the last 168 hours. BNP (last 3 results) No results for input(s): PROBNP in the last 8760 hours. CBG: No results for input(s): GLUCAP in the last 168 hours. D-Dimer: No results for input(s): DDIMER in the last 72 hours. Hgb A1c: No results for input(s): HGBA1C in the last 72 hours. Lipid Profile: No results for input(s): CHOL, HDL, LDLCALC, TRIG, CHOLHDL, LDLDIRECT in the last 72 hours. Thyroid function studies: No results for input(s): TSH, T4TOTAL, T3FREE, THYROIDAB in the last 72 hours.  Invalid input(s): FREET3 Anemia work up: Recent Labs    10/22/23 0928  RETICCTPCT 8.0*   Sepsis Labs: Recent Labs  Lab 10/22/23 0928 10/23/23 0420 10/23/23 0616 10/24/23 0445  WBC 3.4* 6.9  --  4.4  LATICACIDVEN  --  3.9* 2.1*  --     Microbiology No results found for this or any previous visit (from the past 240 hours).  Procedures and diagnostic studies:  MR ABDOMEN MRCP W WO CONTAST Result Date: 10/23/2023 CLINICAL DATA:  Right upper quadrant abdominal pain associated with nausea EXAM: MRI ABDOMEN WITHOUT AND WITH CONTRAST (INCLUDING MRCP) TECHNIQUE:  Multiplanar multisequence MR imaging of the abdomen was performed both before and after the administration of intravenous contrast. Heavily T2-weighted images of the biliary and pancreatic ducts were obtained, and three-dimensional MRCP images were rendered by post processing. CONTRAST:  9mL GADAVIST  GADOBUTROL  1 MMOL/ML IV SOLN COMPARISON:  Same day abdominal ultrasound examination, CT abdomen pelvis dated 04/06/2023 FINDINGS: Lower chest: No acute findings. Hepatobiliary: No mass or other parenchymal abnormality identified. No bile duct dilation. Cholelithiasis. Pancreas: There is prominent curvature of the main pancreatic duct near the pancreatic head/neck (16:9, 17:5). No main ductal dilation. 1.1 cm cystic  focus in communication with the main pancreatic duct in the pancreatic body (14:40, 17:5). Spleen:  Within normal limits in size and appearance. Adrenals/Urinary Tract: No adrenal nodules. No suspicious renal masses identified. No evidence of hydronephrosis. Stomach/Bowel: Visualized portions within the abdomen are unremarkable. Vascular/Lymphatic: No pathologically enlarged lymph nodes identified. No abdominal aortic aneurysm demonstrated. Other:  None. Musculoskeletal: No suspicious bone lesions identified. IMPRESSION: 1. Cholelithiasis without evidence of acute cholecystitis. 2. No bile duct dilation or choledocholithiasis. 3. 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 4. A 1.1 cm cystic focus in communication with the main pancreatic duct in the pancreatic body, likely a side branch IPMN. Recommend follow-up contrast-enhanced MRI/MRCP in 1 year. Electronically Signed   By: Limin  Xu M.D.   On: 10/23/2023 08:40   US  Abdomen Limited RUQ (LIVER/GB) Result Date: 10/23/2023 CLINICAL DATA:  Initial evaluation for acute right upper quadrant pain. EXAM: ULTRASOUND ABDOMEN LIMITED RIGHT UPPER QUADRANT COMPARISON:  Prior ultrasound from 04/08/2023  FINDINGS: Gallbladder: Sludge with a few scattered superimposed small stones seen within the gallbladder lumen. Largest discrete stone measures 7 mm. Gallbladder wall measures within normal limits at 2.4 mm in thickness. No free pericholecystic fluid. No sonographic Murphy sign elicited on exam. Common bile duct: Diameter: 5.9 mm Liver: No focal lesion identified. Within normal limits in parenchymal echogenicity. Portal vein is patent on color Doppler imaging with normal direction of blood flow towards the liver. Other: None. IMPRESSION: 1. Stones and sludge within the gallbladder lumen, but with no sonographic features to suggest acute cholecystitis. 2. No biliary dilatation. Electronically Signed   By: Morene Hoard M.D.   On: 10/23/2023 05:50               LOS: 0 days   Aliza Moret  [facility]   Pager on [email or web address]. If 7PM-7AM, please contact night-coverage at [email or web address]     10/24/2023, 2:29 PM

## 2023-10-24 NOTE — Progress Notes (Signed)
 [facility] SURGICAL PROGRESS NOTE  Hospital Day(s): 0.   Post op day(s): 1 Day Post-Op.   Interval History: Patient seen and examined, more than expected pain persists this morning.  No acute events or new complaints overnight. Patient reports apparent incisional pain, has not been ambulating. Unanticipated elevation in total bilirubin at 3.7.  Expected elevations in transaminases, secondary to surgery.  Review of Systems:  Constitutional: denies fever, chills  Respiratory: denies any shortness of breath  Cardiovascular: denies chest pain or palpitations  Gastrointestinal: denies , N/V, or diarrhea/and bowel function as per interval history Musculoskeletal: denies pain, decreased motor or sensation Integumentary: denies any other rashes or skin discolorations  Vital signs in last 24 hours: [min-max] current  Temp:  [97.2 F (36.2 C)-98.6 F (37 C)] 98.1 F (36.7 C) (09/27 0342) Pulse Rate:  [69-96] 69 (09/27 0342) Resp:  [11-25] 18 (09/27 0342) BP: (99-121)/(54-72) 99/65 (09/27 0342) SpO2:  [98 %-100 %] 100 % (09/27 0342) Weight:  [93 kg] 93 kg (09/26 1433)     Height: 5' 3 (160 cm) Weight: 93 kg BMI (Calculated): 36.32   Intake/Output last 2 shifts:  09/26 0701 - 09/27 0700 In: 1871.1 [I.V.:1660.5; IV Piggyback:210.6] Out: 10 [Blood:10]   Physical Exam:  Constitutional: alert, cooperative and no distress  Respiratory: breathing non-labored at rest  Cardiovascular: regular rate and sinus rhythm  Gastrointestinal: soft, non-tender, obese Integumentary: Incisions clean dry and intact.  Labs:     Latest Ref Rng & Units 10/24/2023    4:45 AM 10/23/2023    4:20 AM 10/22/2023    9:28 AM  CBC  WBC 4.0 - 10.5 K/uL 4.4  6.9  3.4   Hemoglobin 12.0 - 15.0 g/dL 9.3  89.4  89.6   Hematocrit 36.0 - 46.0 % 27.1  30.8  30.0   Platelets 150 - 400 K/uL 157  166  163       Latest Ref Rng & Units 10/24/2023    4:45 AM 10/23/2023    4:20 AM 10/08/2023    9:38 AM  CMP   Glucose 70 - 99 mg/dL 870  880  74   BUN 6 - 20 mg/dL 11  14  13    Creatinine 0.44 - 1.00 mg/dL 9.38  9.21  9.46   Sodium 135 - 145 mmol/L 138  142  138   Potassium 3.5 - 5.1 mmol/L 3.9  3.8  3.9   Chloride 98 - 111 mmol/L 107  107  104   CO2 22 - 32 mmol/L 24  23  26    Calcium 8.9 - 10.3 mg/dL 8.8  8.9  8.9   Total Protein 6.5 - 8.1 g/dL 6.5  7.0  6.0   Total Bilirubin 0.0 - 1.2 mg/dL 3.7  2.2  1.8   Alkaline Phos 38 - 126 U/L 91  58  42   AST 15 - 41 U/L 252  143  24   ALT 0 - 44 U/L 396  62  20     Imaging studies: No new pertinent imaging studies   Assessment/Plan:  48 y.o. female with  1 Day Post-Op s/p robotic cholecystectomy for acute calculous cholecystitis, complicated by pertinent comorbidities including:  Patient Active Problem List   Diagnosis Date Noted   Calculus of gallbladder with acute cholecystitis without obstruction 10/23/2023   Acute cholecystitis 10/23/2023   Right upper quadrant abdominal pain 10/23/2023   Chronic superficial gastritis without bleeding 06/18/2023   Dyspepsia 06/18/2023   Gastroesophageal reflux disease  without esophagitis 06/18/2023   Acute gastritis without hemorrhage 04/04/2023   Hypokalemia 04/01/2023   Morbid obesity (HCC) 04/01/2023   Hepatic steatosis 04/01/2023   Autoimmune hemolytic anemia, unspecified (HCC) 04/01/2023   At risk for hyperglycemia 04/01/2023   Carpal tunnel syndrome of right wrist 10/14/2022   Paresthesia of both hands 09/15/2022   Macrocytic anemia 05/12/2022   Dizziness 05/10/2022   Chest pain 05/10/2022   DOE (dyspnea on exertion) 05/10/2022   Tobacco abuse 05/10/2022   Electrolyte abnormality 05/10/2022   SOB (shortness of breath) 05/10/2022   Obesity (BMI 30-39.9) 05/10/2022   Pulmonary HTN (HCC) 05/10/2022   Symptomatic anemia 11/28/2019   Iron  deficiency 11/28/2019    - Will definitely want to repeat CMP tomorrow, suspect bili elevation secondary to surgery, not to bile leak or obstruction, as  alkaline phosphatase remains normal.  - May advance diet as tolerated.  - I have taken the liberty to liberalize availability of pain medications, in addition to a trial of a single dose of Toradol .  - Will continue to follow with you.  Are additional stress steroids necessary postop or only with surgery?  All of the above findings and recommendations were discussed with the patient, and all of patient's questions were answered to their expressed satisfaction.  -- Honor Leghorn, M.D., DOYAL 10/24/2023

## 2023-10-25 DIAGNOSIS — K810 Acute cholecystitis: Secondary | ICD-10-CM | POA: Diagnosis not present

## 2023-10-25 LAB — CBC
HCT: 25.5 % — ABNORMAL LOW (ref 36.0–46.0)
Hemoglobin: 8.8 g/dL — ABNORMAL LOW (ref 12.0–15.0)
MCH: 35.6 pg — ABNORMAL HIGH (ref 26.0–34.0)
MCHC: 34.5 g/dL (ref 30.0–36.0)
MCV: 103.2 fL — ABNORMAL HIGH (ref 80.0–100.0)
Platelets: 154 K/uL (ref 150–400)
RBC: 2.47 MIL/uL — ABNORMAL LOW (ref 3.87–5.11)
WBC: 3 K/uL — ABNORMAL LOW (ref 4.0–10.5)
nRBC: 0 % (ref 0.0–0.2)

## 2023-10-25 LAB — COMPREHENSIVE METABOLIC PANEL WITH GFR
ALT: 222 U/L — ABNORMAL HIGH (ref 0–44)
AST: 72 U/L — ABNORMAL HIGH (ref 15–41)
Albumin: 3.5 g/dL (ref 3.5–5.0)
Alkaline Phosphatase: 71 U/L (ref 38–126)
Anion gap: 7 (ref 5–15)
BUN: 9 mg/dL (ref 6–20)
CO2: 25 mmol/L (ref 22–32)
Calcium: 8.6 mg/dL — ABNORMAL LOW (ref 8.9–10.3)
Chloride: 106 mmol/L (ref 98–111)
Creatinine, Ser: 0.55 mg/dL (ref 0.44–1.00)
GFR, Estimated: >60 mL/min (ref >60)
Glucose, Bld: 117 mg/dL — ABNORMAL HIGH (ref 70–99)
Potassium: 3.6 mmol/L (ref 3.5–5.1)
Sodium: 138 mmol/L (ref 135–145)
Total Bilirubin: 2.6 mg/dL — ABNORMAL HIGH (ref 0.0–1.2)
Total Protein: 6.3 g/dL — ABNORMAL LOW (ref 6.5–8.1)

## 2023-10-25 MED ORDER — ALUM & MAG HYDROXIDE-SIMETH 200-200-20 MG/5ML PO SUSP: 30.0000 mL | Freq: Four times a day (QID) | ORAL | 0 refills | Status: DC | PRN

## 2023-10-25 MED ORDER — OXYCODONE HCL 5 MG PO TABS
5.0000 mg | ORAL_TABLET | ORAL | 0 refills | Status: AC | PRN
Start: 2023-10-25 — End: ?

## 2023-10-25 MED ORDER — PREDNISONE 20 MG PO TABS
80.0000 mg | ORAL_TABLET | Freq: Every day | ORAL | Status: DC
Start: 2023-10-26 — End: 2023-10-25

## 2023-10-25 MED ORDER — PREDNISONE 20 MG PO TABS: 80.0000 mg | ORAL_TABLET | Freq: Every day | ORAL | Status: DC

## 2023-10-25 MED ORDER — PREDNISONE 50 MG PO TABS: 100.0000 mg | ORAL_TABLET | Freq: Every day | ORAL | Status: DC

## 2023-10-25 MED ORDER — ONDANSETRON 4 MG PO TBDP: 4.0000 mg | ORAL_TABLET | Freq: Four times a day (QID) | ORAL | 0 refills | Status: AC | PRN

## 2023-10-25 NOTE — Progress Notes (Signed)
 Progress Note    Danielle [name]  FMW:[id number] DOB: [birth year]  DOA: 10/23/2023 PCP: Donal Channing SQUIBB, FNP      Brief Narrative:    Medical records reviewed and are as summarized below:  Danielle [name] is a 48 y.o. 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  She was found to have acute cholecystitis.  She was admitted to the surgical service and the hospitalist team was consulted to assist with medical management.      Assessment/Plan:   Principal Problem:   Acute cholecystitis Active Problems:   Autoimmune hemolytic anemia, unspecified (HCC)   Gastroesophageal reflux disease without esophagitis   Calculus of gallbladder with acute cholecystitis without obstruction   Right upper quadrant abdominal pain   Body mass index is 36.31 kg/m.  (Class II obesity)   Acute cholecystitis, cholelithiasis, elevated liver enzymes: S/p robotic assisted laparoscopic cholecystectomy on 10/23/2023.  Liver enzymes are improving.  Analgesics as needed for pain.  She has tolerated full liquid diet thus far.  Follow-up with general surgeon   Autoimmune hemolytic anemia: Discontinue IV hydrocortisone .  Restart home dose of prednisone  100 mg daily tomorrow.   Continue Protonix  for GI prophylaxis   Diet Order             Diet full liquid Room service appropriate? Yes; Fluid consistency: Thin  Diet effective now                                  Consultants: General Surgeon  Procedures: Laparoscopic cholecystectomy    Medications:    acetaminophen   1,000 mg Oral Q6H   Chlorhexidine  Gluconate Cloth  6 each Topical Daily   enoxaparin  (LOVENOX ) injection  40 mg Subcutaneous Daily   folic acid   1 mg Oral Daily   pantoprazole  (PROTONIX ) IV  40 mg Intravenous Q12H   [START ON 10/26/2023] predniSONE   100 mg Oral Q breakfast    sodium chloride  flush  10-40 mL Intracatheter Q12H   Continuous Infusions:  piperacillin -tazobactam (ZOSYN )  IV 3.375 g (10/25/23 0505)     Anti-infectives (From admission, onward)    Start     Dose/Rate Route Frequency Ordered Stop   10/23/23 1200  piperacillin -tazobactam (ZOSYN ) IVPB 3.375 g        3.375 g 12.5 mL/hr over 240 Minutes Intravenous Every 8 hours 10/23/23 1120 10/30/23 1359              Family Communication/Anticipated D/C date and plan/Code Status   DVT prophylaxis: enoxaparin  (LOVENOX ) injection 40 mg Start: 10/24/23 1200 SCDs Start: 10/23/23 1121     Code Status: Full Code  Family Communication: Daughter at the bedside Disposition Plan: Plan to discharge home         Subjective:   Interval events noted.  No complaints.  She feels much better today.  Abdominal pain is better.  She confirmed that she takes prednisone  100 mg daily.  Objective:    Vitals:   10/25/23 0359 10/25/23 0805 10/25/23 0805 10/25/23 1112  BP: 118/68 (!) 98/59 (!) 98/59 (!) 101/56  Pulse: 89 61 (!) 59 66  Resp: 16 16 16 18   Temp: 97.9 F (36.6 C) 98.7 F (37.1 C) 98.7 F (37.1 C) 97.8 F (36.6 C)  TempSrc: Oral Oral Oral Oral  SpO2: 100% 99% 100% 100%  Weight:  Height:       No data found.   Intake/Output Summary (Last 24 hours) at 10/25/2023 1152 Last data filed at 10/25/2023 1049 Gross per 24 hour  Intake 384.56 ml  Output --  Net 384.56 ml   Filed Weights   10/23/23 0347 10/23/23 1433  Weight: 95.3 kg 93 kg    Exam:  GEN: NAD SKIN: Warm and dry EYES: No pallor or icterus ENT: MMM CV: RRR PULM: CTA B ABD: soft, obese, NT, +BS, multiple small incisions are clean, dry and intact CNS: AAO x 3, non focal EXT: No edema or tenderness      Data Reviewed:   I have personally reviewed following labs and imaging studies:  Labs: Labs show the following:   Basic Metabolic Panel: Recent Labs  Lab 10/23/23 0420 10/24/23 0445  10/25/23 0514  NA 142 138 138  K 3.8 3.9 3.6  CL 107 107 106  CO2 23 24 25   GLUCOSE 119* 129* 117*  BUN 14 11 9   CREATININE 0.78 0.61 0.55  CALCIUM 8.9 8.8* 8.6*   GFR Estimated Creatinine Clearance: 94.1 mL/min (by C-G formula based on SCr of 0.55 mg/dL). Liver Function Tests: Recent Labs  Lab 10/23/23 0420 10/24/23 0445 10/25/23 0514  AST 143* 252* 72*  ALT 62* 396* 222*  ALKPHOS 58 91 71  BILITOT 2.2* 3.7* 2.6*  PROT 7.0 6.5 6.3*  ALBUMIN 4.1 3.8 3.5   Recent Labs  Lab 10/23/23 0420  LIPASE 76*   No results for input(s): AMMONIA in the last 168 hours. Coagulation profile No results for input(s): INR, PROTIME in the last 168 hours.  CBC: Recent Labs  Lab 10/22/23 0928 10/23/23 0420 10/24/23 0445 10/25/23 0514  WBC 3.4* 6.9 4.4 3.0*  NEUTROABS 2.1 5.1  --   --   HGB 10.3* 10.5* 9.3* 8.8*  HCT 30.0* 30.8* 27.1* 25.5*  MCV 102.0* 103.4* 102.7* 103.2*  PLT 163 166 157 154   Cardiac Enzymes: No results for input(s): CKTOTAL, CKMB, CKMBINDEX, TROPONINI in the last 168 hours. BNP (last 3 results) No results for input(s): PROBNP in the last 8760 hours. CBG: No results for input(s): GLUCAP in the last 168 hours. D-Dimer: No results for input(s): DDIMER in the last 72 hours. Hgb A1c: No results for input(s): HGBA1C in the last 72 hours. Lipid Profile: No results for input(s): CHOL, HDL, LDLCALC, TRIG, CHOLHDL, LDLDIRECT in the last 72 hours. Thyroid function studies: No results for input(s): TSH, T4TOTAL, T3FREE, THYROIDAB in the last 72 hours.  Invalid input(s): FREET3 Anemia work up: No results for input(s): VITAMINB12, FOLATE, FERRITIN, TIBC, IRON , RETICCTPCT in the last 72 hours.  Sepsis Labs: Recent Labs  Lab 10/22/23 0928 10/23/23 0420 10/23/23 0616 10/24/23 0445 10/25/23 0514  WBC 3.4* 6.9  --  4.4 3.0*  LATICACIDVEN  --  3.9* 2.1*  --   --     Microbiology No results found for this  or any previous visit (from the past 240 hours).  Procedures and diagnostic studies:  No results found.              LOS: 0 days   Navi Ewton  [facility]   Pager on [email or web address]. If 7PM-7AM, please contact night-coverage at [email or web address]     10/25/2023, 11:52 AM

## 2023-10-25 NOTE — Discharge Summary (Signed)
 Physician Discharge Summary  Patient ID: Danielle [name] MRN: [id number] DOB/AGE: 04-14-1975 48 y.o.  Admit date: 10/23/2023 Discharge date: 10/25/2023  Admission Diagnoses: Acute calculous cholecystitis  Discharge Diagnoses:  Principal Problem:   Acute cholecystitis Active Problems:   Autoimmune hemolytic anemia, unspecified (HCC)   Gastroesophageal reflux disease without esophagitis   Calculus of gallbladder with acute cholecystitis without obstruction   Right upper quadrant abdominal pain   Discharged Condition: good  Hospital Course: Admitted, underwent robotic cholecystectomy for acute cholecystitis, received stress steroids with surgery, received IV antibiotics throughout her admission, no longer necessary on discharge.  Pain controlled, tolerating regular diet.  Ready for home.  Consults: Hospitalist to assist with steroid dosing, and other medical comorbidities.  Significant Diagnostic Studies: radiology: See reports of preoperative workup.  Treatments: antibiotics: Zosyn , analgesia: acetaminophen , Morphine , and oxycodone , steroids: solu-cortef , and surgery: Robotic cholecystectomy.  Discharge Exam: Blood pressure (!) 101/56, pulse 66, temperature 97.8 F (36.6 C), temperature source Oral, resp. rate 18, height 5' 3 (1.6 m), weight 93 kg, last menstrual period 11/21/2020, SpO2 100%. General appearance: alert, cooperative, and no distress Resp: clear to auscultation bilaterally Cardio: regular rate and rhythm GI: soft, non-tender; bowel sounds normal; no masses,  no organomegaly Incision/Wound: Incisions are clean dry and intact.  With Dermabond.  Disposition: Discharge disposition: 01-Home or Self Care       Discharge Instructions     Call MD for:  persistant nausea and vomiting   Complete by: As directed    Call MD for:  redness, tenderness, or signs of infection (pain, swelling, redness, odor or green/yellow discharge around incision site)   Complete by: As  directed    Call MD for:  severe uncontrolled pain   Complete by: As directed    Diet - low sodium heart healthy   Complete by: As directed    Discharge instructions   Complete by: As directed    May resume aspirin or other anticoagulants after 48 hours.   Discharge wound care:   Complete by: As directed    Your incision was closed with Dermabond.  It is best to keep it clean and dry, it will tolerate a brief shower, but do not soak it or apply any creams or lotions to the incisions.  The Dermabond should gradually flake off over time.  Keep it open to air so you can evaluate your incisions.  Dermabond assists the underlying sutures to keep your incision closed and protected from infection.  Should you develop some drainage from your incision, some drops of drainage would be okay but if it persists continue to put keep a dry dressing over it.   Driving Restrictions   Complete by: As directed    No driving until cleared after follow-up appointment.  Is not advised to drive while taking narcotic pain medications or in significant pain.   Increase activity slowly   Complete by: As directed    Lifting restrictions   Complete by: As directed    Strongly advised against any form of lifting greater than 15 pounds over the next 4 to 6 weeks.  This involves pushing/pulling movements as well.  After 4 weeks when may gradually engage in more activities remaining aware of any new pain/tenderness elicited, and avoiding those for the full duration of 6 weeks.  Walking is encouraged.  Climbing stairs with caution.      Allergies as of 10/25/2023       Reactions   Dilaudid  [hydromorphone  Hcl] Anxiety  Rituxan  [rituximab ] Anaphylaxis   Solu-medrol  [methylprednisolone ] Anaphylaxis   Resolved after EpiPen  on 04/02/2023 (After Solu-medrol  250 mg iv, but no reaction after Solu-medrol  100 mg on 04/01/23 weird)        Medication List     TAKE these medications    acetaminophen  500 MG tablet Commonly  known as: TYLENOL  Take 500 mg by mouth every 6 (six) hours as needed.   albuterol  108 (90 Base) MCG/ACT inhaler Commonly known as: VENTOLIN  HFA Inhale 2 puffs into the lungs every 6 (six) hours as needed for wheezing or shortness of breath.   alum & mag hydroxide-simeth 200-200-20 MG/5ML suspension Commonly known as: MAALOX/MYLANTA Take 30 mLs by mouth every 6 (six) hours as needed for flatulence.   diphenoxylate -atropine  2.5-0.025 MG tablet Commonly known as: LOMOTIL  Take 2 tablets by mouth 4 (four) times daily as needed for diarrhea or loose stools (diarrhea unrelieved by imodium).   folic acid  1 MG tablet Commonly known as: FOLVITE  Take 1 tablet (1 mg total) by mouth daily.   ondansetron  4 MG disintegrating tablet Commonly known as: ZOFRAN -ODT Take 1 tablet (4 mg total) by mouth every 6 (six) hours as needed for nausea.   oxyCODONE  5 MG immediate release tablet Commonly known as: Oxy IR/ROXICODONE  Take 1-2 tablets (5-10 mg total) by mouth every 3 (three) hours as needed for moderate pain (pain score 4-6) or breakthrough pain.   pantoprazole  40 MG tablet Commonly known as: Protonix  Take 1 tablet (40 mg total) by mouth daily. Do not stop medication abruptly.   polyethylene glycol powder 17 GM/SCOOP powder Commonly known as: GLYCOLAX /MIRALAX  Take 17 g by mouth daily. Dissolve 1 capful (17g) in 4-8 ounces of liquid and take by mouth daily.   predniSONE  20 MG tablet Commonly known as: DELTASONE  Take 5 tablets (100 mg total) by mouth daily with breakfast. Do not stop until recommended.   venlafaxine XR 37.5 MG 24 hr capsule Commonly known as: EFFEXOR-XR Take 37.5 mg by mouth daily.   Wegovy 0.25 MG/0.5ML Soaj SQ injection Generic drug: semaglutide-weight management Inject 0.25 mg into the skin once a week.               Discharge Care Instructions  (From admission, onward)           Start     Ordered   10/25/23 0000  Discharge wound care:       Comments:  Your incision was closed with Dermabond.  It is best to keep it clean and dry, it will tolerate a brief shower, but do not soak it or apply any creams or lotions to the incisions.  The Dermabond should gradually flake off over time.  Keep it open to air so you can evaluate your incisions.  Dermabond assists the underlying sutures to keep your incision closed and protected from infection.  Should you develop some drainage from your incision, some drops of drainage would be okay but if it persists continue to put keep a dry dressing over it.   10/25/23 1445            Follow-up Information     Schulz, Zachary R, PA-C. Schedule an appointment as soon as possible for a visit on 11/04/2023.   Specialty: Physician Assistant Contact information: [address] [place] [address] [phone]                 I personally spent a total of 30 minutes in the care of the patient today including preparing  to see the patient, performing a medically appropriate exam/evaluation, counseling and educating, placing orders, and documenting clinical information in the EHR.    Signed: Honor Leghorn, M.D., [facility] 10/25/2023, 2:45 PM

## 2023-10-25 NOTE — Progress Notes (Signed)
 [facility] SURGICAL PROGRESS NOTE  Hospital Day(s): 0.   Post op day(s): 2 Days Post-Op.   Interval History: Patient seen and examined, pain significantly improved, smiling today.  No acute events or new complaints overnight. Unanticipated elevation in total bilirubin now down to 2.6, trending toward normal.  Expected elevations in transaminases, secondary to surgery, are also trending toward normal with persistently normal alkaline phosphatase.  No leukocytosis, no fevers.  She is tolerating her full liquid diet at this point.  And is looking forward to some real food.  Review of Systems:  Constitutional: denies fever, chills  Respiratory: denies any shortness of breath  Cardiovascular: denies chest pain or palpitations  Gastrointestinal: denies , N/V, or diarrhea/and bowel function as per interval history Musculoskeletal: denies pain, decreased motor or sensation Integumentary: denies any other rashes or skin discolorations  Vital signs in last 24 hours: [min-max] current  Temp:  [97.8 F (36.6 C)-98.7 F (37.1 C)] 97.8 F (36.6 C) (09/28 1112) Pulse Rate:  [59-97] 66 (09/28 1112) Resp:  [16-20] 18 (09/28 1112) BP: (98-131)/(56-68) 101/56 (09/28 1112) SpO2:  [99 %-100 %] 100 % (09/28 1112)     Height: 5' 3 (160 cm) Weight: 93 kg BMI (Calculated): 36.32   Intake/Output last 2 shifts:  09/27 0701 - 09/28 0700 In: 144.6 [IV Piggyback:144.6] Out: -    Physical Exam:  Constitutional: alert, cooperative and no distress  Respiratory: breathing non-labored at rest  Cardiovascular: regular rate and sinus rhythm  Gastrointestinal: soft, non-tender, obese Integumentary: Incisions clean dry and intact.  Labs:     Latest Ref Rng & Units 10/25/2023    5:14 AM 10/24/2023    4:45 AM 10/23/2023    4:20 AM  CBC  WBC 4.0 - 10.5 K/uL 3.0  4.4  6.9   Hemoglobin 12.0 - 15.0 g/dL 8.8  9.3  89.4   Hematocrit 36.0 - 46.0 % 25.5  27.1  30.8   Platelets 150 - 400 K/uL 154  157   166       Latest Ref Rng & Units 10/25/2023    5:14 AM 10/24/2023    4:45 AM 10/23/2023    4:20 AM  CMP  Glucose 70 - 99 mg/dL 882  870  880   BUN 6 - 20 mg/dL 9  11  14    Creatinine 0.44 - 1.00 mg/dL 9.44  9.38  9.21   Sodium 135 - 145 mmol/L 138  138  142   Potassium 3.5 - 5.1 mmol/L 3.6  3.9  3.8   Chloride 98 - 111 mmol/L 106  107  107   CO2 22 - 32 mmol/L 25  24  23    Calcium 8.9 - 10.3 mg/dL 8.6  8.8  8.9   Total Protein 6.5 - 8.1 g/dL 6.3  6.5  7.0   Total Bilirubin 0.0 - 1.2 mg/dL 2.6  3.7  2.2   Alkaline Phos 38 - 126 U/L 71  91  58   AST 15 - 41 U/L 72  252  143   ALT 0 - 44 U/L 222  396  62     Imaging studies: No new pertinent imaging studies   Assessment/Plan:  48 y.o. female with  2 Days Post-Op s/p robotic cholecystectomy for acute calculous cholecystitis, complicated by pertinent comorbidities including:  Patient Active Problem List   Diagnosis Date Noted   Calculus of gallbladder with acute cholecystitis without obstruction 10/23/2023   Acute cholecystitis 10/23/2023   Right upper quadrant  abdominal pain 10/23/2023   Chronic superficial gastritis without bleeding 06/18/2023   Dyspepsia 06/18/2023   Gastroesophageal reflux disease without esophagitis 06/18/2023   Acute gastritis without hemorrhage 04/04/2023   Hypokalemia 04/01/2023   Morbid obesity (HCC) 04/01/2023   Hepatic steatosis 04/01/2023   Autoimmune hemolytic anemia, unspecified (HCC) 04/01/2023   At risk for hyperglycemia 04/01/2023   Carpal tunnel syndrome of right wrist 10/14/2022   Paresthesia of both hands 09/15/2022   Macrocytic anemia 05/12/2022   Dizziness 05/10/2022   Chest pain 05/10/2022   DOE (dyspnea on exertion) 05/10/2022   Tobacco abuse 05/10/2022   Electrolyte abnormality 05/10/2022   SOB (shortness of breath) 05/10/2022   Obesity (BMI 30-39.9) 05/10/2022   Pulmonary HTN (HCC) 05/10/2022   Symptomatic anemia 11/28/2019   Iron  deficiency 11/28/2019    - Suspect bili  elevation secondary to surgery, not to bile leak or obstruction, as alkaline phosphatase remains normal.  I expect this trend to continue as outpatient.  - May resume usual diet at home  - I believe it is safe and reasonable to discharge her home today.  Thank you very much for your assistance  All of the above findings and recommendations were discussed with the patient, and all of patient's questions were answered to their expressed satisfaction.  -- Honor Leghorn, M.D., [facility] 10/25/2023

## 2023-10-25 NOTE — Plan of Care (Signed)

## 2023-10-26 LAB — TYPE AND SCREEN
ABO/RH(D): B POS
Antibody Screen: POSITIVE
Unit division: 0
Unit division: 0

## 2023-10-26 LAB — BPAM RBC
Blood Product Expiration Date: 202510102359
Blood Product Expiration Date: 202510122359
Unit Type and Rh: 5100
Unit Type and Rh: 5100

## 2023-10-28 ENCOUNTER — Encounter: Payer: Self-pay | Admitting: Internal Medicine

## 2023-10-28 LAB — SURGICAL PATHOLOGY

## 2023-10-29 ENCOUNTER — Telehealth: Payer: Self-pay | Admitting: Internal Medicine

## 2023-10-29 ENCOUNTER — Other Ambulatory Visit: Payer: Self-pay | Admitting: *Deleted

## 2023-10-29 MED ORDER — CALCIUM CARBONATE ANTACID 500 MG PO CHEW: 1.0000 | CHEWABLE_TABLET | Freq: Three times a day (TID) | ORAL | 2 refills | Status: AC

## 2023-10-29 MED ORDER — PREDNISONE 20 MG PO TABS: 80.0000 mg | ORAL_TABLET | Freq: Every day | ORAL | 1 refills | Status: DC

## 2023-10-29 MED ORDER — PANTOPRAZOLE SODIUM 40 MG PO TBEC: 40.0000 mg | DELAYED_RELEASE_TABLET | Freq: Two times a day (BID) | ORAL | 1 refills | Status: DC

## 2023-10-29 NOTE — Telephone Encounter (Signed)
 Spoke to patient re: her concerns of abdominal discomfort/steroid dose  Recommend cut down the prednisone  to 80 mg a day; and increase Protonix  to twice a day; add Tums and Maalox prn  # Patient interested in more definitive therapy-options being Cytoxan vs- splenectomy.   Please move the pt from [name] schedule next week to my schedule- ealry next week- move one of the anemia/Iron  pt to  lauren schedule-   GB

## 2023-11-02 ENCOUNTER — Encounter: Payer: Self-pay | Admitting: Internal Medicine

## 2023-11-02 ENCOUNTER — Other Ambulatory Visit: Payer: Self-pay

## 2023-11-02 DIAGNOSIS — K2900 Acute gastritis without bleeding: Secondary | ICD-10-CM

## 2023-11-02 MED ORDER — PANTOPRAZOLE SODIUM 40 MG PO TBEC: 40.0000 mg | DELAYED_RELEASE_TABLET | Freq: Two times a day (BID) | ORAL | 1 refills | Status: AC

## 2023-11-05 ENCOUNTER — Encounter: Payer: Self-pay | Admitting: Internal Medicine

## 2023-11-05 ENCOUNTER — Inpatient Hospital Stay: Attending: Internal Medicine

## 2023-11-05 ENCOUNTER — Ambulatory Visit: Admitting: Nurse Practitioner

## 2023-11-05 ENCOUNTER — Inpatient Hospital Stay (HOSPITAL_BASED_OUTPATIENT_CLINIC_OR_DEPARTMENT_OTHER): Admitting: Internal Medicine

## 2023-11-05 ENCOUNTER — Other Ambulatory Visit

## 2023-11-05 VITALS — BP 95/64 | HR 81 | Temp 97.6°F | Resp 18 | Ht 63.0 in | Wt 206.0 lb

## 2023-11-05 DIAGNOSIS — F32A Depression, unspecified: Secondary | ICD-10-CM | POA: Insufficient documentation

## 2023-11-05 DIAGNOSIS — K862 Cyst of pancreas: Secondary | ICD-10-CM | POA: Insufficient documentation

## 2023-11-05 DIAGNOSIS — Z885 Allergy status to narcotic agent status: Secondary | ICD-10-CM | POA: Insufficient documentation

## 2023-11-05 DIAGNOSIS — D509 Iron deficiency anemia, unspecified: Secondary | ICD-10-CM | POA: Insufficient documentation

## 2023-11-05 DIAGNOSIS — Z803 Family history of malignant neoplasm of breast: Secondary | ICD-10-CM | POA: Insufficient documentation

## 2023-11-05 DIAGNOSIS — D591 Autoimmune hemolytic anemia, unspecified: Secondary | ICD-10-CM | POA: Insufficient documentation

## 2023-11-05 DIAGNOSIS — Z79624 Long term (current) use of inhibitors of nucleotide synthesis: Secondary | ICD-10-CM | POA: Diagnosis not present

## 2023-11-05 DIAGNOSIS — Z9049 Acquired absence of other specified parts of digestive tract: Secondary | ICD-10-CM | POA: Diagnosis not present

## 2023-11-05 DIAGNOSIS — D472 Monoclonal gammopathy: Secondary | ICD-10-CM | POA: Diagnosis not present

## 2023-11-05 DIAGNOSIS — M25559 Pain in unspecified hip: Secondary | ICD-10-CM | POA: Diagnosis not present

## 2023-11-05 DIAGNOSIS — Z90710 Acquired absence of both cervix and uterus: Secondary | ICD-10-CM | POA: Diagnosis not present

## 2023-11-05 DIAGNOSIS — R161 Splenomegaly, not elsewhere classified: Secondary | ICD-10-CM | POA: Diagnosis not present

## 2023-11-05 DIAGNOSIS — K921 Melena: Secondary | ICD-10-CM | POA: Insufficient documentation

## 2023-11-05 DIAGNOSIS — T451X5A Adverse effect of antineoplastic and immunosuppressive drugs, initial encounter: Secondary | ICD-10-CM | POA: Insufficient documentation

## 2023-11-05 DIAGNOSIS — R42 Dizziness and giddiness: Secondary | ICD-10-CM | POA: Insufficient documentation

## 2023-11-05 DIAGNOSIS — Z87891 Personal history of nicotine dependence: Secondary | ICD-10-CM | POA: Diagnosis not present

## 2023-11-05 DIAGNOSIS — Z7952 Long term (current) use of systemic steroids: Secondary | ICD-10-CM | POA: Insufficient documentation

## 2023-11-05 DIAGNOSIS — R519 Headache, unspecified: Secondary | ICD-10-CM | POA: Diagnosis not present

## 2023-11-05 DIAGNOSIS — Z79899 Other long term (current) drug therapy: Secondary | ICD-10-CM | POA: Diagnosis not present

## 2023-11-05 DIAGNOSIS — K8000 Calculus of gallbladder with acute cholecystitis without obstruction: Secondary | ICD-10-CM | POA: Diagnosis not present

## 2023-11-05 DIAGNOSIS — R269 Unspecified abnormalities of gait and mobility: Secondary | ICD-10-CM | POA: Diagnosis not present

## 2023-11-05 DIAGNOSIS — K760 Fatty (change of) liver, not elsewhere classified: Secondary | ICD-10-CM | POA: Insufficient documentation

## 2023-11-05 DIAGNOSIS — K5900 Constipation, unspecified: Secondary | ICD-10-CM | POA: Diagnosis not present

## 2023-11-05 DIAGNOSIS — R531 Weakness: Secondary | ICD-10-CM | POA: Diagnosis not present

## 2023-11-05 DIAGNOSIS — D649 Anemia, unspecified: Secondary | ICD-10-CM | POA: Diagnosis not present

## 2023-11-05 LAB — CBC WITH DIFFERENTIAL (CANCER CENTER ONLY)
Abs Immature Granulocytes: 0.01 K/uL (ref 0.00–0.07)
Basophils Absolute: 0 K/uL (ref 0.0–0.1)
Basophils Relative: 0 %
Eosinophils Absolute: 0.1 K/uL (ref 0.0–0.5)
Eosinophils Relative: 3 %
HCT: 30 % — ABNORMAL LOW (ref 36.0–46.0)
Hemoglobin: 10.5 g/dL — ABNORMAL LOW (ref 12.0–15.0)
Immature Granulocytes: 0 %
Lymphocytes Relative: 28 %
Lymphs Abs: 1.1 K/uL (ref 0.7–4.0)
MCH: 36.2 pg — ABNORMAL HIGH (ref 26.0–34.0)
MCHC: 35 g/dL (ref 30.0–36.0)
MCV: 103.4 fL — ABNORMAL HIGH (ref 80.0–100.0)
Monocytes Absolute: 0.2 K/uL (ref 0.1–1.0)
Monocytes Relative: 6 %
Neutro Abs: 2.4 K/uL (ref 1.7–7.7)
Neutrophils Relative %: 63 %
Platelet Count: 180 K/uL (ref 150–400)
RBC: 2.9 MIL/uL — ABNORMAL LOW (ref 3.87–5.11)
Smear Review: NORMAL
WBC Count: 3.8 K/uL — ABNORMAL LOW (ref 4.0–10.5)
nRBC: 0 % (ref 0.0–0.2)

## 2023-11-05 LAB — SAMPLE TO BLOOD BANK

## 2023-11-05 LAB — LACTATE DEHYDROGENASE: LDH: 188 U/L (ref 98–192)

## 2023-11-05 MED ORDER — LACTULOSE 10 GM/15ML PO SOLN: 30.0000 g | Freq: Three times a day (TID) | ORAL | 0 refills | Status: DC

## 2023-11-05 MED ORDER — PREDNISONE 20 MG PO TABS
60.0000 mg | ORAL_TABLET | Freq: Every day | ORAL | 0 refills | Status: DC
Start: 2023-11-05 — End: 2023-12-21

## 2023-11-05 NOTE — Progress Notes (Signed)
 Fatigue/weakness: YES-BETTER Dyspena: OCC. SOB Light headedness: NO Blood in stool: NO- CONSTIPATED, OTC RX NOT HELPING.  Cholecystectomy 10/25/23, Dr. Derrill.   Having pain in her hip, surgical pain and headache today, 5/10.  Needs refill on prednisone , pended.

## 2023-11-05 NOTE — Assessment & Plan Note (Addendum)
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 G-4 anaphylactic reaction to Rituximab . Discontinued Tavvalisse in June secondary to elevated LFTs. AUG hide he denies use for refractory hemolytic anemia hemolytic anemia seen 2025-Discontinue Imuran .  # Given the overall improvement on 80 mg of prednisone  improved - Hb 10.8/LDH normal-recommend proceeding with prednisone  60 mg a day x 4 weeks-  - Continue folic acid .    # Discussed the need for escalating the therapy given her ongoing -refractoriness to steroids and intolerance to higher dose.  Patient has had reaction to multiple drugs including possibly IV methylprednisolone /, however tolerated solucortef.  If her anemia worsens on taper -recommend strongly consider Cytoxan /and splenectomy.   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   # Contsipation-continue Dulcolax MiraLAX  and add; lactulose .  # DISPOSITION: # follow up in 2 weeks- labs- cbc/LDH # follow up in 4 weeks- APP Tuesday/Thusrday APP- labs- cbc/cmp-LDH; retic count- D-2 1 unit of blood- .B

## 2023-11-05 NOTE — Progress Notes (Signed)
 [facility] CONSULT NOTE  Patient Care Team: Donal Channing SQUIBB, FNP as PCP - General (Family Medicine) Rennie Cindy SAUNDERS, MD as Consulting Physician (Oncology)  CHIEF COMPLAINTS/PURPOSE OF CONSULTATION: ANEMIA  HEMATOLOGY HISTORY  # IRON  DEFICIENCY ANEMIA CHRONIC [since 2019] AUG 2021- hb-9; MCV- 63; Iron  sat- 3%; EGD > 7 years ago [[place]]; colonoscopy/capsule-NA; PO iron  constipates.   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  JAK2 testing negative.  However bone marrow biopsy- [MAY 2024-negative for any myelophthisis process; suggestive of hemolysis] HOLD off metformin; and detromethomorphan- Bupropion. PNH testing-NEGATIVE; complement levels.;  Hemoglobin cascade; G6PD levels-WNL.  April 2024 abdominal ultrasound showed mild splenomegaly.    # 11/07/2022-RELAPSE-start  Rituximab  X1 [OCT 11th, 2024- ]G-4-anaphylactic reaction needing EpiPen  transporting to the emergency room.-Discontinue further rituximab . JAN 1st week, 2025- .500 mg BID; mycophenolate -DC:  Imuran + tavasslise [mid March 2025]   # MARCH 2025- OFF prednisone  [ stopped/tapered OFF 10 mg a day] x2 months.  mycophenolate  500 mg twice daily-acute hemolytic anemia- complicated nstay in the hospital/ICU-anaphylactic reaction to IV Solu-Medrol -improved with EpiPen  although she tolerated Solu-Medrol  injection well earlier in the hospital stay.  Started on prednisone  60 to 80 mg a day- # Start Imuran  100 mg/day; # DC cellcept .   # September 2025-acute cholecystitis status postcholecystectomy.  #History of heavy menstrual cycles  HISTORY OF PRESENTING ILLNESS: with her mother.  Ambulating.  Danielle [name] 48 y.o.  female with relapsed autoimmune hemolytic anemia steroid responsive -but DAT  negative [ [suspect IgA] who presents for follow-up regarding her hemoglobin levels and prednisone  tapering.  In the interim patient was admitted to the hospital for cholecystitis acute-s/p cholecystectomy.  She has been on prednisone , starting at 100 mg and then reduced to 80 mg, for several weeks following gallbladder surgery. Her hemoglobin has been around 10.5.  She feels overall improved.  She experiences headaches and constipation. For constipation, she is taking Dulcolax and Miralax , but these have not fully resolved her symptoms. She has difficulty urinating, which she attributes to constipation.  She reports hip pain, described as strong enough to cause limping. She initially thought it might improve after her gallbladder surgery, but the pain persists.  She is stressed due to caring for her son but is reassured by her mother's support. She wants to visit [place] indicating a longing for travel and connection to her roots.   Review of Systems  Constitutional:  Negative for chills, diaphoresis, fever and weight loss.  HENT:  Negative for nosebleeds and sore throat.   Eyes:  Negative for double vision.  Respiratory:  Negative for hemoptysis and sputum production.   Cardiovascular:  Negative for chest pain, palpitations and orthopnea.  Gastrointestinal:  Positive for blood in stool and constipation. Negative for abdominal pain, diarrhea, heartburn and melena.  Genitourinary:  Negative for dysuria, frequency and urgency.  Musculoskeletal:  Negative for back pain and joint pain.  Skin: Negative.  Negative for itching and rash.  Neurological:  Positive for dizziness, weakness and headaches. Negative for focal weakness.  Endo/Heme/Allergies:  Does not bruise/bleed easily.  Psychiatric/Behavioral:  Negative for depression. The patient is not nervous/anxious.     MEDICAL HISTORY:  Past Medical History:  Diagnosis Date   Anemia    Depression    Hepatic steatosis 04/01/2023     SURGICAL HISTORY: Past  Surgical History:  Procedure Laterality Date   ABDOMINAL HYSTERECTOMY     COLONOSCOPY WITH PROPOFOL  N/A 05/12/2022   Procedure: COLONOSCOPY WITH PROPOFOL ;  Surgeon: Jinny Carmine, MD;  Location: [facility] ENDOSCOPY;  Service: Endoscopy;  Laterality: N/A;   ESOPHAGOGASTRODUODENOSCOPY N/A 04/08/2023   Procedure: EGD (ESOPHAGOGASTRODUODENOSCOPY);  Surgeon: Toledo, Ladell POUR, MD;  Location: [facility] ENDOSCOPY;  Service: Gastroenterology;  Laterality: N/A;   ESOPHAGOGASTRODUODENOSCOPY (EGD) WITH PROPOFOL  N/A 05/12/2022   Procedure: ESOPHAGOGASTRODUODENOSCOPY (EGD) WITH PROPOFOL ;  Surgeon: Jinny Carmine, MD;  Location: [facility] ENDOSCOPY;  Service: Endoscopy;  Laterality: N/A;   IR BONE MARROW BIOPSY & ASPIRATION  05/28/2022   TONSILLECTOMY      SOCIAL HISTORY: Social History   Socioeconomic History   Marital status: Single    Spouse name: Not on file   Number of children: Not on file   Years of education: Not on file   Highest education level: Not on file  Occupational History   Not on file  Tobacco Use   Smoking status: Former    Current packs/day: 0.00    Average packs/day: 0.5 packs/day for 25.0 years (12.5 ttl pk-yrs)    Types: Cigarettes    Start date: 05/09/1997    Quit date: 05/10/2022    Years since quitting: 1.4   Smokeless tobacco: Never  Vaping Use   Vaping status: Never Used  Substance and Sexual Activity   Alcohol use: Not Currently   Drug use: Never   Sexual activity: Not Currently  Other Topics Concern   Not on file  Social History Narrative   Lives in [place] with 4 kids; stay at home [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    Social Drivers of Corporate investment banker Strain: Not on file  Food Insecurity: No Food Insecurity (10/23/2023)   Hunger Vital Sign    Worried About Running Out of Food in the Last Year: Never true    Ran Out of Food in the Last Year: Never true  Transportation Needs: No Transportation Needs (10/23/2023)    PRAPARE - Administrator, Civil Service (Medical): No    Lack of Transportation (Non-Medical): No  Physical Activity: Not on file  Stress: Not on file  Social Connections: Unknown (04/01/2023)   Social Connection and Isolation Panel    Frequency of Communication with Friends and Family: More than three times a week    Frequency of Social Gatherings with Friends and Family: More than three times a week    Attends Religious Services: More than 4 times per year    Active Member of [employer] or Organizations: No    Attends Banker Meetings: Never    Marital Status: Patient declined  Catering manager Violence: Not At Risk (10/23/2023)   Humiliation, Afraid, Rape, and Kick questionnaire    Fear of Current or Ex-Partner: No    Emotionally Abused: No    Physically Abused: No    Sexually Abused: No    FAMILY HISTORY: Family History  Problem Relation Age of Onset   Breast cancer Paternal Aunt     ALLERGIES:  is allergic to dilaudid  [hydromorphone  hcl], rituxan  [rituximab ], and solu-medrol  [methylprednisolone ].  MEDICATIONS:  Current Outpatient Medications  Medication Sig Dispense Refill   acetaminophen  (TYLENOL ) 500 MG tablet Take 500 mg by mouth every 6 (six) hours as needed.     alum & mag hydroxide-simeth (MAALOX/MYLANTA) 200-200-20 MG/5ML suspension Take 30 mLs by mouth every 6 (six) hours as needed for flatulence. 355 mL 0  calcium carbonate (TUMS) 500 MG chewable tablet Chew 1 tablet (200 mg of elemental calcium total) by mouth 3 (three) times daily. 90 tablet 2   folic acid  (FOLVITE ) 1 MG tablet Take 1 tablet (1 mg total) by mouth daily. 90 tablet 1   lactulose  (CHRONULAC ) 10 GM/15ML solution Take 45 mLs (30 g total) by mouth 3 (three) times daily. As needed for constipation 450 mL 0   ondansetron  (ZOFRAN -ODT) 4 MG disintegrating tablet Take 1 tablet (4 mg total) by mouth every 6 (six) hours as needed for nausea. 20 tablet 0   pantoprazole  (PROTONIX ) 40 MG tablet  Take 1 tablet (40 mg total) by mouth 2 (two) times daily. Do not stop medication abruptly. 90 tablet 1   polyethylene glycol powder (GLYCOLAX /MIRALAX ) 17 GM/SCOOP powder Take 17 g by mouth daily. Dissolve 1 capful (17g) in 4-8 ounces of liquid and take by mouth daily.     Semaglutide-Weight Management (WEGOVY) 0.25 MG/0.5ML SOAJ Inject 0.25 mg into the skin once a week.     oxyCODONE  (OXY IR/ROXICODONE ) 5 MG immediate release tablet Take 1-2 tablets (5-10 mg total) by mouth every 3 (three) hours as needed for moderate pain (pain score 4-6) or breakthrough pain. (Patient not taking: Reported on 11/05/2023) 30 tablet 0   predniSONE  (DELTASONE ) 20 MG tablet Take 3 tablets (60 mg total) by mouth daily with breakfast. Do not stop until recommended. 150 tablet 0   No current facility-administered medications for this visit.      PHYSICAL EXAMINATION:   Vitals:   11/05/23 0922  BP: 95/64  Pulse: 81  Resp: 18  Temp: 97.6 F (36.4 C)  SpO2: 100%         Filed Weights   11/05/23 0922  Weight: 206 lb (93.4 kg)         Physical Exam HENT:     Head: Normocephalic and atraumatic.     Mouth/Throat:     Pharynx: No oropharyngeal exudate.  Eyes:     Pupils: Pupils are equal, round, and reactive to light.  Cardiovascular:     Rate and Rhythm: Normal rate and regular rhythm.  Pulmonary:     Effort: Pulmonary effort is normal. No respiratory distress.     Breath sounds: Normal breath sounds. No wheezing.  Abdominal:     General: Bowel sounds are normal. There is no distension.     Palpations: Abdomen is soft. There is no mass.     Tenderness: There is no abdominal tenderness. There is no guarding or rebound.  Musculoskeletal:        General: No tenderness. Normal range of motion.     Cervical back: Normal range of motion and neck supple.  Skin:    General: Skin is warm.  Neurological:     Mental Status: She is alert and oriented to person, place, and time.  Psychiatric:         Mood and Affect: Affect normal.     LABORATORY DATA:  I have reviewed the data as listed Lab Results  Component Value Date   WBC 3.8 (L) 11/05/2023   HGB 10.5 (L) 11/05/2023   HCT 30.0 (L) 11/05/2023   MCV 103.4 (H) 11/05/2023   PLT 180 11/05/2023   Recent Labs    07/20/23 1405 07/28/23 0935 08/05/23 0846 08/25/23 0826 10/23/23 0420 10/24/23 0445 10/25/23 0514  NA  --   --   --    < > 142 138 138  K  --   --   --    < >  3.8 3.9 3.6  CL  --   --   --    < > 107 107 106  CO2  --   --   --    < > 23 24 25   GLUCOSE  --   --   --    < > 119* 129* 117*  BUN  --   --   --    < > 14 11 9   CREATININE  --   --   --    < > 0.78 0.61 0.55  CALCIUM  --   --   --    < > 8.9 8.8* 8.6*  GFRNONAA  --   --   --    < > >60 >60 >60  PROT 6.8 6.5 6.5   < > 7.0 6.5 6.3*  ALBUMIN 4.0 3.7 3.8   < > 4.1 3.8 3.5  AST 51* 25 18   < > 143* 252* 72*  ALT 70* 78* 17   < > 62* 396* 222*  ALKPHOS 71 95 70   < > 58 91 71  BILITOT 2.1* 1.5* 1.8*   < > 2.2* 3.7* 2.6*  BILIDIR 0.4* 0.3* 0.3*  --   --   --   --   IBILI 1.7* 1.2* 1.5*  --   --   --   --    < > = values in this interval not displayed.     MR 3D Recon At Scanner Result Date: 10/23/2023 CLINICAL DATA:  Right upper quadrant abdominal pain associated with nausea EXAM: MRI ABDOMEN WITHOUT AND WITH CONTRAST (INCLUDING MRCP) TECHNIQUE: Multiplanar multisequence MR imaging of the abdomen was performed both before and after the administration of intravenous contrast. Heavily T2-weighted images of the biliary and pancreatic ducts were obtained, and three-dimensional MRCP images were rendered by post processing. CONTRAST:  9mL GADAVIST  GADOBUTROL  1 MMOL/ML IV SOLN COMPARISON:  Same day abdominal ultrasound examination, CT abdomen pelvis dated 04/06/2023 FINDINGS: Lower chest: No acute findings. Hepatobiliary: No mass or other parenchymal abnormality identified. No bile duct dilation. Cholelithiasis. Pancreas: There is prominent curvature of the main  pancreatic duct near the pancreatic head/neck (16:9, 17:5). No main ductal dilation. 1.1 cm cystic focus in communication with the main pancreatic duct in the pancreatic body (14:40, 17:5). Spleen:  Within normal limits in size and appearance. Adrenals/Urinary Tract: No adrenal nodules. No suspicious renal masses identified. No evidence of hydronephrosis. Stomach/Bowel: Visualized portions within the abdomen are unremarkable. Vascular/Lymphatic: No pathologically enlarged lymph nodes identified. No abdominal aortic aneurysm demonstrated. Other:  None. Musculoskeletal: No suspicious bone lesions identified. IMPRESSION: 1. Cholelithiasis without evidence of acute cholecystitis. 2. No bile duct dilation or choledocholithiasis. 3. 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 4. A 1.1 cm cystic focus in communication with the main pancreatic duct in the pancreatic body, likely a side branch IPMN. Recommend follow-up contrast-enhanced MRI/MRCP in 1 year. Electronically Signed   By: Limin  Xu M.D.   On: 10/23/2023 10:53   MR ABDOMEN MRCP W WO CONTAST Result Date: 10/23/2023 CLINICAL DATA:  Right upper quadrant abdominal pain associated with nausea EXAM: MRI ABDOMEN WITHOUT AND WITH CONTRAST (INCLUDING MRCP) TECHNIQUE: Multiplanar multisequence MR imaging of the abdomen was performed both before and after the administration of intravenous contrast. Heavily T2-weighted images of the biliary and pancreatic ducts were obtained, and three-dimensional MRCP images were rendered by post processing. CONTRAST:  9mL GADAVIST  GADOBUTROL  1 MMOL/ML IV SOLN COMPARISON:  Same  day abdominal ultrasound examination, CT abdomen pelvis dated 04/06/2023 FINDINGS: Lower chest: No acute findings. Hepatobiliary: No mass or other parenchymal abnormality identified. No bile duct dilation. Cholelithiasis. Pancreas: There is prominent curvature of the main pancreatic duct near the pancreatic  head/neck (16:9, 17:5). No main ductal dilation. 1.1 cm cystic focus in communication with the main pancreatic duct in the pancreatic body (14:40, 17:5). Spleen:  Within normal limits in size and appearance. Adrenals/Urinary Tract: No adrenal nodules. No suspicious renal masses identified. No evidence of hydronephrosis. Stomach/Bowel: Visualized portions within the abdomen are unremarkable. Vascular/Lymphatic: No pathologically enlarged lymph nodes identified. No abdominal aortic aneurysm demonstrated. Other:  None. Musculoskeletal: No suspicious bone lesions identified. IMPRESSION: 1. Cholelithiasis without evidence of acute cholecystitis. 2. No bile duct dilation or choledocholithiasis. 3. 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 4. A 1.1 cm cystic focus in communication with the main pancreatic duct in the pancreatic body, likely a side branch IPMN. Recommend follow-up contrast-enhanced MRI/MRCP in 1 year. Electronically Signed   By: Limin  Xu M.D.   On: 10/23/2023 08:40   US  Abdomen Limited RUQ (LIVER/GB) Result Date: 10/23/2023 CLINICAL DATA:  Initial evaluation for acute right upper quadrant pain. EXAM: ULTRASOUND ABDOMEN LIMITED RIGHT UPPER QUADRANT COMPARISON:  Prior ultrasound from 04/08/2023 FINDINGS: Gallbladder: Sludge with a few scattered superimposed small stones seen within the gallbladder lumen. Largest discrete stone measures 7 mm. Gallbladder wall measures within normal limits at 2.4 mm in thickness. No free pericholecystic fluid. No sonographic Murphy sign elicited on exam. Common bile duct: Diameter: 5.9 mm Liver: No focal lesion identified. Within normal limits in parenchymal echogenicity. Portal vein is patent on color Doppler imaging with normal direction of blood flow towards the liver. Other: None. IMPRESSION: 1. Stones and sludge within the gallbladder lumen, but with no sonographic features to suggest acute cholecystitis. 2. No  biliary dilatation. Electronically Signed   By: Morene Hoard M.D.   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 G-4 anaphylactic reaction to Rituximab . Discontinued Tavvalisse in June secondary to elevated LFTs. AUG hide he denies use for refractory hemolytic anemia hemolytic anemia seen 2025-Discontinue Imuran .  # Given the overall improvement on 80 mg of prednisone  improved - Hb 10.8/LDH normal-recommend proceeding with prednisone  60 mg a day x 4 weeks-  - Continue folic acid .    # Discussed the need for escalating the therapy given her ongoing -refractoriness to steroids and intolerance to higher dose.  Patient has had reaction to multiple drugs including possibly IV methylprednisolone /, however tolerated solucortef.  If her anemia worsens on taper -recommend strongly consider Cytoxan /and splenectomy.   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   # Contsipation-continue Dulcolax MiraLAX  and add; lactulose .  # DISPOSITION: # follow up in 2 weeks- labs- cbc/LDH # follow up in 4 weeks- APP Tuesday/Thusrday APP- labs- cbc/cmp-LDH; retic count- D-2 1 unit of blood- .B   All questions were answered. The patient knows to call the clinic with any problems, questions or concerns.    Cindy JONELLE Joe, MD 11/05/2023 1:32 PM

## 2023-11-06 ENCOUNTER — Inpatient Hospital Stay

## 2023-11-10 ENCOUNTER — Ambulatory Visit: Admitting: Surgery

## 2023-11-10 ENCOUNTER — Encounter: Payer: Self-pay | Admitting: Surgery

## 2023-11-10 VITALS — BP 93/66 | HR 83 | Temp 98.3°F | Ht 63.0 in | Wt 202.0 lb

## 2023-11-10 DIAGNOSIS — Z9049 Acquired absence of other specified parts of digestive tract: Secondary | ICD-10-CM

## 2023-11-10 DIAGNOSIS — K8000 Calculus of gallbladder with acute cholecystitis without obstruction: Secondary | ICD-10-CM

## 2023-11-10 DIAGNOSIS — Z09 Encounter for follow-up examination after completed treatment for conditions other than malignant neoplasm: Secondary | ICD-10-CM

## 2023-11-10 NOTE — Progress Notes (Unsigned)
 [facility] POST-OP OFFICE VISIT  11/10/2023  HPI: Danielle [name] is a 48 y.o. female had surgery on October 23, 2023, now s/p robotic cholecystectomy for mild chronic cholecystitis with cholelithiasis.  Only complaints of some mild abdominal wall tenderness involving her right inferior quadrant scar area.  Bowels are now moving better, she denies any vomiting, nausea, fevers or chills.  She reports her stools are are of normal color.  Vital signs: Ht 5' 3 (1.6 m)   LMP 11/21/2020   BMI 36.49 kg/m    Physical Exam: Constitutional: She appears well, the best I have seen her.  Sclera are clear. Abdomen: Soft flaccid nontender. Skin: Incisions are all clean dry and intact with minimal flaking Dermabond.  No evidence of ecchymosis, no evidence of peri-incisional mass or induration.  Assessment/Plan: This is a 48 y.o. female s/p robotic cholecystectomy for mild chronic cholecystitis with cholelithiasis.   Patient Active Problem List   Diagnosis Date Noted   Calculus of gallbladder with acute cholecystitis without obstruction 10/23/2023   Acute cholecystitis 10/23/2023   Right upper quadrant abdominal pain 10/23/2023   Chronic superficial gastritis without bleeding 06/18/2023   Dyspepsia 06/18/2023   Gastroesophageal reflux disease without esophagitis 06/18/2023   Acute gastritis without hemorrhage 04/04/2023   Hypokalemia 04/01/2023   Morbid obesity (HCC) 04/01/2023   Hepatic steatosis 04/01/2023   Autoimmune hemolytic anemia, unspecified (HCC) 04/01/2023   At risk for hyperglycemia 04/01/2023   Carpal tunnel syndrome of right wrist 10/14/2022   Paresthesia of both hands 09/15/2022   Macrocytic anemia 05/12/2022   Dizziness 05/10/2022   Chest pain 05/10/2022   DOE (dyspnea on exertion) 05/10/2022   Tobacco abuse 05/10/2022   Electrolyte abnormality 05/10/2022   SOB (shortness of breath) 05/10/2022   Obesity (BMI 30-39.9) 05/10/2022   Pulmonary HTN (HCC)  05/10/2022   Symptomatic anemia 11/28/2019   Iron  deficiency 11/28/2019    - She appears to be progressing well after cholecystectomy, we will allow her to proceed with driving again.  And may follow-up with us  on an as-needed basis.   Honor Leghorn M.D., FACS 11/10/2023, 9:36 AM

## 2023-11-10 NOTE — Patient Instructions (Signed)

## 2023-11-19 ENCOUNTER — Inpatient Hospital Stay

## 2023-11-19 DIAGNOSIS — D591 Autoimmune hemolytic anemia, unspecified: Secondary | ICD-10-CM

## 2023-11-19 LAB — CBC WITH DIFFERENTIAL (CANCER CENTER ONLY)
Abs Immature Granulocytes: 0.01 K/uL (ref 0.00–0.07)
Basophils Absolute: 0 K/uL (ref 0.0–0.1)
Basophils Relative: 0 %
Eosinophils Absolute: 0.1 K/uL (ref 0.0–0.5)
Eosinophils Relative: 2 %
HCT: 31.1 % — ABNORMAL LOW (ref 36.0–46.0)
Hemoglobin: 11 g/dL — ABNORMAL LOW (ref 12.0–15.0)
Immature Granulocytes: 0 %
Lymphocytes Relative: 37 %
Lymphs Abs: 1.4 K/uL (ref 0.7–4.0)
MCH: 36.5 pg — ABNORMAL HIGH (ref 26.0–34.0)
MCHC: 35.4 g/dL (ref 30.0–36.0)
MCV: 103.3 fL — ABNORMAL HIGH (ref 80.0–100.0)
Monocytes Absolute: 0.3 K/uL (ref 0.1–1.0)
Monocytes Relative: 7 %
Neutro Abs: 2 K/uL (ref 1.7–7.7)
Neutrophils Relative %: 54 %
Platelet Count: 169 K/uL (ref 150–400)
RBC: 3.01 MIL/uL — ABNORMAL LOW (ref 3.87–5.11)
RDW: 19.2 % — ABNORMAL HIGH (ref 11.5–15.5)
WBC Count: 3.8 K/uL — ABNORMAL LOW (ref 4.0–10.5)
nRBC: 0 % (ref 0.0–0.2)

## 2023-11-19 LAB — LACTATE DEHYDROGENASE: LDH: 199 U/L — ABNORMAL HIGH (ref 98–192)

## 2023-11-19 LAB — SAMPLE TO BLOOD BANK

## 2023-11-27 ENCOUNTER — Encounter: Payer: Self-pay | Admitting: Internal Medicine

## 2023-12-02 ENCOUNTER — Encounter: Payer: Self-pay | Admitting: Internal Medicine

## 2023-12-02 ENCOUNTER — Inpatient Hospital Stay: Admitting: Nurse Practitioner

## 2023-12-02 ENCOUNTER — Inpatient Hospital Stay

## 2023-12-03 ENCOUNTER — Inpatient Hospital Stay

## 2023-12-07 ENCOUNTER — Inpatient Hospital Stay: Attending: Internal Medicine

## 2023-12-07 ENCOUNTER — Inpatient Hospital Stay (HOSPITAL_BASED_OUTPATIENT_CLINIC_OR_DEPARTMENT_OTHER): Admitting: Nurse Practitioner

## 2023-12-07 VITALS — BP 109/69 | HR 90 | Temp 97.5°F | Resp 18 | Wt 199.2 lb

## 2023-12-07 DIAGNOSIS — D591 Autoimmune hemolytic anemia, unspecified: Secondary | ICD-10-CM

## 2023-12-07 DIAGNOSIS — D472 Monoclonal gammopathy: Secondary | ICD-10-CM | POA: Insufficient documentation

## 2023-12-07 DIAGNOSIS — D649 Anemia, unspecified: Secondary | ICD-10-CM

## 2023-12-07 DIAGNOSIS — Z7952 Long term (current) use of systemic steroids: Secondary | ICD-10-CM | POA: Diagnosis not present

## 2023-12-07 LAB — CBC WITH DIFFERENTIAL (CANCER CENTER ONLY)
Abs Immature Granulocytes: 0.02 K/uL (ref 0.00–0.07)
Basophils Absolute: 0 K/uL (ref 0.0–0.1)
Basophils Relative: 0 %
Eosinophils Absolute: 0.1 K/uL (ref 0.0–0.5)
Eosinophils Relative: 2 %
HCT: 26.3 % — ABNORMAL LOW (ref 36.0–46.0)
Hemoglobin: 9.5 g/dL — ABNORMAL LOW (ref 12.0–15.0)
Immature Granulocytes: 1 %
Lymphocytes Relative: 35 %
Lymphs Abs: 1 K/uL (ref 0.7–4.0)
MCH: 37.8 pg — ABNORMAL HIGH (ref 26.0–34.0)
MCHC: 36.1 g/dL — ABNORMAL HIGH (ref 30.0–36.0)
MCV: 104.8 fL — ABNORMAL HIGH (ref 80.0–100.0)
Monocytes Absolute: 0.1 K/uL (ref 0.1–1.0)
Monocytes Relative: 3 %
Neutro Abs: 1.7 K/uL (ref 1.7–7.7)
Neutrophils Relative %: 59 %
Platelet Count: 136 K/uL — ABNORMAL LOW (ref 150–400)
RBC: 2.51 MIL/uL — ABNORMAL LOW (ref 3.87–5.11)
RDW: 19.5 % — ABNORMAL HIGH (ref 11.5–15.5)
WBC Count: 2.8 K/uL — ABNORMAL LOW (ref 4.0–10.5)
nRBC: 0.7 % — ABNORMAL HIGH (ref 0.0–0.2)

## 2023-12-07 LAB — RETIC PANEL
Immature Retic Fract: 25.2 % — ABNORMAL HIGH (ref 2.3–15.9)
RBC.: 2.5 MIL/uL — ABNORMAL LOW (ref 3.87–5.11)
Retic Count, Absolute: 55.3 K/uL (ref 19.0–186.0)
Retic Ct Pct: 2.2 % (ref 0.4–3.1)
Reticulocyte Hemoglobin: 47.1 pg (ref >27.9)

## 2023-12-07 LAB — LACTATE DEHYDROGENASE: LDH: 319 U/L — ABNORMAL HIGH (ref 98–192)

## 2023-12-07 LAB — SAMPLE TO BLOOD BANK

## 2023-12-07 NOTE — Progress Notes (Signed)
 [facility] CONSULT NOTE  Patient Care Team: Donal Channing SQUIBB, FNP as PCP - General (Family Medicine) Rennie Cindy SAUNDERS, MD as Consulting Physician (Oncology)  CHIEF COMPLAINTS/PURPOSE OF CONSULTATION: ANEMIA  HEMATOLOGY HISTORY  # IRON  DEFICIENCY ANEMIA CHRONIC [since 2019] AUG 2021- hb-9; MCV- 63; Iron  sat- 3%; EGD > 7 years ago [[place]]; colonoscopy/capsule-NA; PO iron  constipates.   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  JAK2 testing negative.  However bone marrow biopsy- [MAY 2024-negative for any myelophthisis process; suggestive of hemolysis] HOLD off metformin; and detromethomorphan- Bupropion. PNH testing-NEGATIVE; complement levels.;  Hemoglobin cascade; G6PD levels-WNL.  April 2024 abdominal ultrasound showed mild splenomegaly.    # 11/07/2022-RELAPSE-start  Rituximab  X1 [OCT 11th, 2024- ]G-4-anaphylactic reaction needing EpiPen  transporting to the emergency room.-Discontinue further rituximab . JAN 1st week, 2025- .500 mg BID; mycophenolate -DC:  Imuran + tavasslise [mid March 2025]   # MARCH 2025- OFF prednisone  [ stopped/tapered OFF 10 mg a day] x2 months.  mycophenolate  500 mg twice daily-acute hemolytic anemia- complicated nstay in the hospital/ICU-anaphylactic reaction to IV Solu-Medrol -improved with EpiPen  although she tolerated Solu-Medrol  injection well earlier in the hospital stay.  Started on prednisone  60 to 80 mg a day- # Start Imuran  100 mg/day; # DC cellcept .   # History of heavy menstrual cycles  HISTORY OF PRESENTING ILLNESS: Alone. Ambulating.   Danielle [name] 48 y.o. 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  She last saw Dr. Rennie on 11/05/2023.  Was previously taking prednisone  80 mg a day and due to improvement in her numbers it was reduced to 60 mg daily.  She denies complaints today.  Some exertional dyspnea.  Her sister had a stroke and her mother has left to take care of her.  Review of Systems  Constitutional:  Negative for chills, diaphoresis, fever and weight loss.  HENT:  Negative for nosebleeds and sore throat.   Respiratory:  Positive for shortness of breath. Negative for cough, hemoptysis and sputum production.   Cardiovascular:  Negative for chest pain, palpitations and orthopnea.  Gastrointestinal:  Negative for abdominal pain, blood in stool, constipation, diarrhea, heartburn, melena and nausea.  Genitourinary:  Negative for hematuria.  Musculoskeletal:  Negative for back pain, falls and joint pain.  Skin: Negative.  Negative for itching and rash.  Neurological:  Negative for dizziness, focal weakness, weakness and headaches.  Endo/Heme/Allergies:  Does not bruise/bleed easily.  Psychiatric/Behavioral:  Negative for depression. The patient is not nervous/anxious.     MEDICAL HISTORY:  Past Medical History:  Diagnosis Date   Anemia    Depression    Hepatic steatosis 04/01/2023    SURGICAL HISTORY: Past Surgical History:  Procedure Laterality Date   ABDOMINAL HYSTERECTOMY     COLONOSCOPY WITH PROPOFOL  N/A 05/12/2022   Procedure: COLONOSCOPY WITH PROPOFOL ;  Surgeon: Jinny Carmine, MD;  Location: [facility] ENDOSCOPY;  Service: Endoscopy;  Laterality: N/A;   ESOPHAGOGASTRODUODENOSCOPY N/A 04/08/2023   Procedure: EGD (ESOPHAGOGASTRODUODENOSCOPY);  Surgeon: Toledo, Ladell POUR, MD;  Location: [facility] ENDOSCOPY;  Service: Gastroenterology;  Laterality: N/A;   ESOPHAGOGASTRODUODENOSCOPY (EGD) WITH PROPOFOL  N/A 05/12/2022   Procedure: ESOPHAGOGASTRODUODENOSCOPY (EGD) WITH PROPOFOL ;  Surgeon: Jinny Carmine, MD;  Location: [facility] ENDOSCOPY;  Service: Endoscopy;   Laterality: N/A;  IR BONE MARROW BIOPSY & ASPIRATION  05/28/2022   TONSILLECTOMY      SOCIAL HISTORY: Social History   Socioeconomic History   Marital status: Single    Spouse name: Not on file   Number of children: Not on file   Years of education: Not on file   Highest education level: Not on file  Occupational History   Not on file  Tobacco Use   Smoking status: Former    Current packs/day: 0.00    Average packs/day: 0.5 packs/day for 25.0 years (12.5 ttl pk-yrs)    Types: Cigarettes    Start date: 05/09/1997    Quit date: 05/10/2022    Years since quitting: 1.5   Smokeless tobacco: Never  Vaping Use   Vaping status: Never Used  Substance and Sexual Activity   Alcohol use: Not Currently   Drug use: Never   Sexual activity: Not Currently  Other Topics Concern   Not on file  Social History Narrative   Lives in  with 4 kids; stay at home [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    Social Drivers of Corporate Investment Banker Strain: Not on file  Food Insecurity: No Food Insecurity (10/23/2023)   Hunger Vital Sign    Worried About Running Out of Food in the Last Year: Never true    Ran Out of Food in the Last Year: Never true  Transportation Needs: No Transportation Needs (10/23/2023)   PRAPARE - Administrator, Civil Service (Medical): No    Lack of Transportation (Non-Medical): No  Physical Activity: Not on file  Stress: Not on file  Social Connections: Unknown (04/01/2023)   Social Connection and Isolation Panel    Frequency of Communication with Friends and Family: More than three times a week    Frequency of Social Gatherings with Friends and Family: More than three times a week    Attends Religious Services: More than 4 times per year    Active Member of [employer] or Organizations: No    Attends Banker Meetings: Never    Marital Status: Patient declined  Catering Manager Violence: Not At Risk (10/23/2023)   Humiliation,  Afraid, Rape, and Kick questionnaire    Fear of Current or Ex-Partner: No    Emotionally Abused: No    Physically Abused: No    Sexually Abused: No   FAMILY HISTORY: Family History  Problem Relation Age of Onset   Breast cancer Paternal Aunt    ALLERGIES:  is allergic to dilaudid  [hydromorphone  hcl], rituxan  [rituximab ], and solu-medrol  [methylprednisolone ].  MEDICATIONS:  Current Outpatient Medications  Medication Sig Dispense Refill   acetaminophen  (TYLENOL ) 500 MG tablet Take 500 mg by mouth every 6 (six) hours as needed.     alum & mag hydroxide-simeth (MAALOX/MYLANTA) 200-200-20 MG/5ML suspension Take 30 mLs by mouth every 6 (six) hours as needed for flatulence. 355 mL 0   calcium carbonate (TUMS) 500 MG chewable tablet Chew 1 tablet (200 mg of elemental calcium total) by mouth 3 (three) times daily. 90 tablet 2   folic acid  (FOLVITE ) 1 MG tablet Take 1 tablet (1 mg total) by mouth daily. 90 tablet 1   lactulose  (CHRONULAC ) 10 GM/15ML solution Take 45 mLs (30 g total) by mouth 3 (three) times daily. As needed for constipation 450 mL 0   ondansetron  (ZOFRAN -ODT) 4 MG disintegrating tablet Take 1 tablet (4 mg total) by mouth every 6 (six) hours as needed for nausea. 20 tablet 0  oxyCODONE  (OXY IR/ROXICODONE ) 5 MG immediate release tablet Take 1-2 tablets (5-10 mg total) by mouth every 3 (three) hours as needed for moderate pain (pain score 4-6) or breakthrough pain. (Patient not taking: Reported on 11/05/2023) 30 tablet 0   pantoprazole  (PROTONIX ) 40 MG tablet Take 1 tablet (40 mg total) by mouth 2 (two) times daily. Do not stop medication abruptly. 90 tablet 1   polyethylene glycol powder (GLYCOLAX /MIRALAX ) 17 GM/SCOOP powder Take 17 g by mouth daily. Dissolve 1 capful (17g) in 4-8 ounces of liquid and take by mouth daily.     predniSONE  (DELTASONE ) 20 MG tablet Take 3 tablets (60 mg total) by mouth daily with breakfast. Do not stop until recommended. 150 tablet 0   Semaglutide-Weight  Management (WEGOVY) 0.25 MG/0.5ML SOAJ Inject 0.25 mg into the skin once a week.     No current facility-administered medications for this visit.   PHYSICAL EXAMINATION: Vitals:   12/07/23 0948  BP: 109/69  Pulse: 90  Resp: 18  Temp: (!) 97.5 F (36.4 C)  SpO2: 100%   Filed Weights   12/07/23 0948  Weight: 199 lb 3.2 oz (90.4 kg)   Physical Exam Vitals reviewed.  Constitutional:      Appearance: She is not ill-appearing.  HENT:     Head: Normocephalic and atraumatic.  Cardiovascular:     Rate and Rhythm: Normal rate and regular rhythm.  Pulmonary:     Effort: Pulmonary effort is normal. No respiratory distress.  Abdominal:     General: There is no distension.     Palpations: Abdomen is soft.     Tenderness: There is no abdominal tenderness.  Musculoskeletal:        General: No tenderness.  Skin:    General: Skin is warm.     Coloration: Skin is not pale.     Findings: No bruising or rash.  Neurological:     Mental Status: She is alert and oriented to person, place, and time.  Psychiatric:        Mood and Affect: Mood and affect normal.        Behavior: Behavior normal.     LABORATORY DATA:  I have reviewed the data as listed Lab Results  Component Value Date   WBC 2.8 (L) 12/07/2023   HGB 9.5 (L) 12/07/2023   HCT 26.3 (L) 12/07/2023   MCV 104.8 (H) 12/07/2023   PLT 136 (L) 12/07/2023   Recent Labs    07/20/23 1405 07/28/23 0935 08/05/23 0846 08/25/23 0826 10/23/23 0420 10/24/23 0445 10/25/23 0514  NA  --   --   --    < > 142 138 138  K  --   --   --    < > 3.8 3.9 3.6  CL  --   --   --    < > 107 107 106  CO2  --   --   --    < > 23 24 25   GLUCOSE  --   --   --    < > 119* 129* 117*  BUN  --   --   --    < > 14 11 9   CREATININE  --   --   --    < > 0.78 0.61 0.55  CALCIUM  --   --   --    < > 8.9 8.8* 8.6*  GFRNONAA  --   --   --    < > >60 >60 >60  PROT  6.8 6.5 6.5   < > 7.0 6.5 6.3*  ALBUMIN 4.0 3.7 3.8   < > 4.1 3.8 3.5  AST 51* 25 18   <  > 143* 252* 72*  ALT 70* 78* 17   < > 62* 396* 222*  ALKPHOS 71 95 70   < > 58 91 71  BILITOT 2.1* 1.5* 1.8*   < > 2.2* 3.7* 2.6*  BILIDIR 0.4* 0.3* 0.3*  --   --   --   --   IBILI 1.7* 1.2* 1.5*  --   --   --   --    < > = values in this interval not displayed.   Component 09:22 (12/07/23) 2 wk ago (11/19/23) 1 mo ago (11/05/23) 1 mo ago (10/22/23) 1 mo ago (10/15/23) 2 mo ago (10/08/23) 2 mo ago (09/30/23)  LDH 319 High  199 High  CM 188 CM 353 High  CM 684 High  CM 1,498 High   >2,500 High      No results found.  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 G-4 anaphylactic reaction to Rituximab . Discontinued Tavvalisse in June secondary to elevated LFTs. AUG 2025-Discontinue Imuran .   # improved on 80 mg of prednisone .  Hemoglobin 10.8, LDH normal in October 2025.  Reduce to prednisone  60 mg a day.   # Today, hemoglobin has dipped to 9.5, LDH risen to 319.  I recommend escalating prednisone  to 80 mg daily.  Continue folic acid .   # She previously discussed the need for escalating the therapy given her ongoing refractoriness to steroids and intolerance to higher doses.  Patient has had reaction to multiple drugs including possibly IV methylprednisolone /, however tolerated solucortef.  If her anemia worsens on taper, recommend strongly consider Cytoxan and splenectomy.    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    # Contsipation- continue Dulcolax MiraLAX  and add; lactulose .   # DISPOSITION: # follow up in 2 weeks- labs- cbc/LDH # follow up in 4 weeks- lab (cbc, cmp, ldh, retic), Dr Rennie, - D-2- 1 unit of blood- la  No problem-specific Assessment & Plan notes found for this encounter.  All questions were answered. The patient knows to call the clinic with any problems,  questions or concerns.   Tinnie KANDICE Dawn, NP 12/07/2023   CC: Dr Rennie

## 2023-12-08 ENCOUNTER — Inpatient Hospital Stay

## 2023-12-21 ENCOUNTER — Ambulatory Visit: Payer: Self-pay | Admitting: Nurse Practitioner

## 2023-12-21 ENCOUNTER — Other Ambulatory Visit: Payer: Self-pay | Admitting: *Deleted

## 2023-12-21 ENCOUNTER — Inpatient Hospital Stay

## 2023-12-21 DIAGNOSIS — D591 Autoimmune hemolytic anemia, unspecified: Secondary | ICD-10-CM

## 2023-12-21 DIAGNOSIS — D649 Anemia, unspecified: Secondary | ICD-10-CM

## 2023-12-21 LAB — CBC WITH DIFFERENTIAL (CANCER CENTER ONLY)
Abs Immature Granulocytes: 0.02 K/uL (ref 0.00–0.07)
Basophils Absolute: 0 K/uL (ref 0.0–0.1)
Basophils Relative: 0 %
Eosinophils Absolute: 0.1 K/uL (ref 0.0–0.5)
Eosinophils Relative: 2 %
HCT: 21.2 % — ABNORMAL LOW (ref 36.0–46.0)
Hemoglobin: 7.6 g/dL — ABNORMAL LOW (ref 12.0–15.0)
Immature Granulocytes: 1 %
Lymphocytes Relative: 33 %
Lymphs Abs: 0.9 K/uL (ref 0.7–4.0)
MCH: 39.2 pg — ABNORMAL HIGH (ref 26.0–34.0)
MCHC: 35.8 g/dL (ref 30.0–36.0)
MCV: 109.3 fL — ABNORMAL HIGH (ref 80.0–100.0)
Monocytes Absolute: 0.1 K/uL (ref 0.1–1.0)
Monocytes Relative: 2 %
Neutro Abs: 1.8 K/uL (ref 1.7–7.7)
Neutrophils Relative %: 62 %
Platelet Count: 143 K/uL — ABNORMAL LOW (ref 150–400)
RBC: 1.94 MIL/uL — ABNORMAL LOW (ref 3.87–5.11)
RDW: 22.4 % — ABNORMAL HIGH (ref 11.5–15.5)
WBC Count: 2.9 K/uL — ABNORMAL LOW (ref 4.0–10.5)
nRBC: 0.7 % — ABNORMAL HIGH (ref 0.0–0.2)

## 2023-12-21 LAB — LACTATE DEHYDROGENASE: LDH: 635 U/L — ABNORMAL HIGH (ref 105–235)

## 2023-12-21 LAB — PREPARE RBC (CROSSMATCH)

## 2023-12-21 MED ORDER — FOLIC ACID 1 MG PO TABS: 1.0000 mg | ORAL_TABLET | Freq: Every day | ORAL | 1 refills | Status: AC

## 2023-12-21 MED ORDER — PREDNISONE 20 MG PO TABS
ORAL_TABLET | ORAL | 1 refills | Status: DC
Start: 2023-12-21 — End: 2023-12-22

## 2023-12-22 ENCOUNTER — Inpatient Hospital Stay

## 2023-12-22 ENCOUNTER — Other Ambulatory Visit: Payer: Self-pay | Admitting: *Deleted

## 2023-12-22 DIAGNOSIS — D649 Anemia, unspecified: Secondary | ICD-10-CM

## 2023-12-22 DIAGNOSIS — D591 Autoimmune hemolytic anemia, unspecified: Secondary | ICD-10-CM

## 2023-12-22 MED ORDER — PREDNISONE 20 MG PO TABS: ORAL_TABLET | ORAL | 1 refills | Status: DC

## 2023-12-22 MED ORDER — DIPHENHYDRAMINE HCL 25 MG PO TABS: 25.0000 mg | ORAL_TABLET | Freq: Once | ORAL | Status: DC

## 2023-12-22 MED ORDER — ACETAMINOPHEN 325 MG PO TABS
650.0000 mg | ORAL_TABLET | Freq: Once | ORAL | Status: AC
  Administered 2023-12-22: 650 mg via ORAL
  Filled 2023-12-22: qty 2

## 2023-12-22 MED ORDER — SODIUM CHLORIDE 0.9% IV SOLUTION
250.0000 mL | Freq: Once | INTRAVENOUS | Status: AC
  Administered 2023-12-22: 100 mL via INTRAVENOUS
  Filled 2023-12-22: qty 250

## 2023-12-22 NOTE — Progress Notes (Signed)
 Spoke with patient this morning regarding Increasing prednisone  to 100mg  daily. She verbalized having such a hard time taking 100 mg daily. I instructed her to take as tolerated best she can.

## 2023-12-23 LAB — BPAM RBC
Blood Product Expiration Date: 202512122359
ISSUE DATE / TIME: 202511250821
Unit Type and Rh: 202512022359
Unit Type and Rh: 5100

## 2023-12-23 LAB — TYPE AND SCREEN
ABO/RH(D): B POS
Antibody Screen: POSITIVE
Donor AG Type: NEGATIVE
Unit division: 0

## 2024-01-06 ENCOUNTER — Encounter: Payer: Self-pay | Admitting: Internal Medicine

## 2024-01-06 ENCOUNTER — Other Ambulatory Visit: Payer: Self-pay

## 2024-01-06 ENCOUNTER — Telehealth: Payer: Self-pay

## 2024-01-06 ENCOUNTER — Inpatient Hospital Stay: Attending: Internal Medicine

## 2024-01-06 ENCOUNTER — Other Ambulatory Visit: Payer: Self-pay | Admitting: *Deleted

## 2024-01-06 ENCOUNTER — Inpatient Hospital Stay (HOSPITAL_BASED_OUTPATIENT_CLINIC_OR_DEPARTMENT_OTHER): Admitting: Internal Medicine

## 2024-01-06 VITALS — BP 105/65 | HR 95 | Temp 98.1°F | Resp 18 | Ht 63.0 in | Wt 198.7 lb

## 2024-01-06 DIAGNOSIS — T8092XA Unspecified transfusion reaction, initial encounter: Secondary | ICD-10-CM | POA: Diagnosis not present

## 2024-01-06 DIAGNOSIS — R0602 Shortness of breath: Secondary | ICD-10-CM | POA: Diagnosis not present

## 2024-01-06 DIAGNOSIS — D591 Autoimmune hemolytic anemia, unspecified: Secondary | ICD-10-CM

## 2024-01-06 DIAGNOSIS — R7989 Other specified abnormal findings of blood chemistry: Secondary | ICD-10-CM | POA: Insufficient documentation

## 2024-01-06 DIAGNOSIS — D5911 Warm autoimmune hemolytic anemia: Secondary | ICD-10-CM | POA: Diagnosis present

## 2024-01-06 DIAGNOSIS — Z5111 Encounter for antineoplastic chemotherapy: Secondary | ICD-10-CM | POA: Diagnosis present

## 2024-01-06 DIAGNOSIS — D649 Anemia, unspecified: Secondary | ICD-10-CM

## 2024-01-06 DIAGNOSIS — R197 Diarrhea, unspecified: Secondary | ICD-10-CM | POA: Diagnosis not present

## 2024-01-06 DIAGNOSIS — R5383 Other fatigue: Secondary | ICD-10-CM | POA: Diagnosis not present

## 2024-01-06 DIAGNOSIS — D472 Monoclonal gammopathy: Secondary | ICD-10-CM | POA: Diagnosis present

## 2024-01-06 DIAGNOSIS — Z5189 Encounter for other specified aftercare: Secondary | ICD-10-CM | POA: Insufficient documentation

## 2024-01-06 DIAGNOSIS — R519 Headache, unspecified: Secondary | ICD-10-CM | POA: Diagnosis not present

## 2024-01-06 DIAGNOSIS — K921 Melena: Secondary | ICD-10-CM | POA: Diagnosis not present

## 2024-01-06 LAB — CMP (CANCER CENTER ONLY)
ALT: 62 U/L — ABNORMAL HIGH (ref 0–44)
AST: 152 U/L — ABNORMAL HIGH (ref 15–41)
Albumin: 4.3 g/dL (ref 3.5–5.0)
Alkaline Phosphatase: 72 U/L (ref 38–126)
Anion gap: 12 (ref 5–15)
BUN: 11 mg/dL (ref 6–20)
CO2: 25 mmol/L (ref 22–32)
Calcium: 9.5 mg/dL (ref 8.9–10.3)
Chloride: 103 mmol/L (ref 98–111)
Creatinine: 0.64 mg/dL (ref 0.44–1.00)
GFR, Estimated: >60 mL/min (ref >60)
Glucose, Bld: 109 mg/dL — ABNORMAL HIGH (ref 70–99)
Potassium: 4.4 mmol/L (ref 3.5–5.1)
Sodium: 139 mmol/L (ref 135–145)
Total Bilirubin: 1.5 mg/dL — ABNORMAL HIGH (ref 0.0–1.2)
Total Protein: 6.8 g/dL (ref 6.5–8.1)

## 2024-01-06 LAB — CBC WITH DIFFERENTIAL (CANCER CENTER ONLY)
Abs Immature Granulocytes: 0.02 K/uL (ref 0.00–0.07)
Basophils Absolute: 0 K/uL (ref 0.0–0.1)
Basophils Relative: 0 %
Eosinophils Absolute: 0 K/uL (ref 0.0–0.5)
Eosinophils Relative: 2 %
HCT: 19.1 % — ABNORMAL LOW (ref 36.0–46.0)
Hemoglobin: 6.7 g/dL — CL (ref 12.0–15.0)
Immature Granulocytes: 1 %
Lymphocytes Relative: 42 %
Lymphs Abs: 0.9 K/uL (ref 0.7–4.0)
MCH: 36.2 pg — ABNORMAL HIGH (ref 26.0–34.0)
MCHC: 35.1 g/dL (ref 30.0–36.0)
MCV: 103.2 fL — ABNORMAL HIGH (ref 80.0–100.0)
Monocytes Absolute: 0.1 K/uL (ref 0.1–1.0)
Monocytes Relative: 3 %
Neutro Abs: 1.1 K/uL — ABNORMAL LOW (ref 1.7–7.7)
Neutrophils Relative %: 52 %
Platelet Count: 90 K/uL — ABNORMAL LOW (ref 150–400)
RBC: 1.85 MIL/uL — ABNORMAL LOW (ref 3.87–5.11)
RDW: 20.2 % — ABNORMAL HIGH (ref 11.5–15.5)
WBC Count: 2.1 K/uL — ABNORMAL LOW (ref 4.0–10.5)
nRBC: 1 % — ABNORMAL HIGH (ref 0.0–0.2)

## 2024-01-06 LAB — RETICULOCYTES
Immature Retic Fract: 17.1 % — ABNORMAL HIGH (ref 2.3–15.9)
RBC.: 1.82 MIL/uL — ABNORMAL LOW (ref 3.87–5.11)
Retic Count, Absolute: 32.6 K/uL (ref 19.0–186.0)
Retic Ct Pct: 1.8 % (ref 0.4–3.1)

## 2024-01-06 LAB — LACTATE DEHYDROGENASE: LDH: >2500 U/L — ABNORMAL HIGH (ref 105–235)

## 2024-01-06 LAB — PREPARE RBC (CROSSMATCH)

## 2024-01-06 MED ORDER — PROCHLORPERAZINE MALEATE 10 MG PO TABS: 10.0000 mg | ORAL_TABLET | Freq: Four times a day (QID) | ORAL | 1 refills | Status: AC | PRN

## 2024-01-06 MED ORDER — ONDANSETRON HCL 8 MG PO TABS: 8.0000 mg | ORAL_TABLET | Freq: Three times a day (TID) | ORAL | 1 refills | Status: AC | PRN

## 2024-01-06 NOTE — Telephone Encounter (Signed)
 CRITICAL VALUE STICKER  CRITICAL VALUE:  hgb 6.7  RECEIVER (on-site recipient of call): Juliet, RN  DATE & TIME NOTIFIED: 12/10 @ 1050  MESSENGER (representative from lab):  MD NOTIFIED: Brahmanday  TIME OF NOTIFICATION: 1052  RESPONSE:  Awaiting

## 2024-01-06 NOTE — Progress Notes (Signed)
 DISCONTINUE OFF PATHWAY REGIMEN - Other   [id number]:Rituximab  375 mg/m2 IV D1 q7 Days:   A cycle is every 7 days:     Rituximab -xxxx   **Always confirm dose/schedule in your pharmacy ordering system**  PRIOR TREATMENT: Rituximab  375 mg/m2 IV D1 q7 Days  START ON PATHWAY REGIMEN - Other    Patient Characteristics:

## 2024-01-06 NOTE — Progress Notes (Signed)
 Fatigue/weakness: YES Dyspena: SOB Light headedness: NO  Blood in stool: NO

## 2024-01-06 NOTE — Assessment & Plan Note (Addendum)
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 G-4 anaphylactic reaction to Rituximab . Discontinued Tavvalisse in June secondary to elevated LFTs. AUG hide he denies use for refractory hemolytic anemia hemolytic anemia seen 2025-Discontinue Imuran .  # Given the overall improvement on 80 mg of prednisone  improved - Hb 10.8/LDH normal-recommend proceeding with prednisone  60 mg a day x 4 weeks-  - Continue folic acid .    # Discussed the need for escalating the therapy given her ongoing -refractoriness to steroids and intolerance to higher dose.  Patient has had reaction to multiple drugs including possibly IV methylprednisolone /, however tolerated solucortef.    # Discussed option of splenectomy versus chemotherapy.  Patient wants to hold off surgery-would recommend Cytoxan single agent 750 mg/m times 1 infusion I also discussed the potential side effects including but not limited to-increasing fatigue, nausea vomiting, diarrhea, hair loss, sores in the mouth, increase risk of infection and also neuropathy.  Also reviewed the multiple strategies to avoid/mitigate similar side effects including preemptive medications-for nausea vomiting.  Also discussed at length regarding good oral hygiene/hydration and in general precautions regarding duration of infections.    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   # Contsipation-continue Dulcolax MiraLAX  and add; lactulose .  # DISPOSITION: # Blood transfusion tomorrow- 1 unit please order.  # chemo education re: cytoxan ASAP # follow up on 12/18- MD; labs- cbc/cmp; HOLD tube- chemo; D-2 blood transfusion--Dr.B

## 2024-01-06 NOTE — Progress Notes (Signed)
 [facility] CONSULT NOTE  Patient Care Team: Donal Channing SQUIBB, FNP as PCP - General (Family Medicine) Rennie Cindy SAUNDERS, MD as Consulting Physician (Oncology)  CHIEF COMPLAINTS/PURPOSE OF CONSULTATION: ANEMIA  HEMATOLOGY HISTORY  # IRON  DEFICIENCY ANEMIA CHRONIC [since 2019] AUG 2021- hb-9; MCV- 63; Iron  sat- 3%; EGD > 7 years ago [[place]]; colonoscopy/capsule-NA; PO iron  constipates.   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  JAK2 testing negative.  However bone marrow biopsy- [MAY 2024-negative for any myelophthisis process; suggestive of hemolysis] HOLD off metformin; and detromethomorphan- Bupropion. PNH testing-NEGATIVE; complement levels.;  Hemoglobin cascade; G6PD levels-WNL.  April 2024 abdominal ultrasound showed mild splenomegaly.    # 11/07/2022-RELAPSE-start  Rituximab  X1 [OCT 11th, 2024- ]G-4-anaphylactic reaction needing EpiPen  transporting to the emergency room.-Discontinue further rituximab . JAN 1st week, 2025- .500 mg BID; mycophenolate -DC:  Imuran + tavasslise [mid March 2025]   # MARCH 2025- OFF prednisone  [ stopped/tapered OFF 10 mg a day] x2 months.  mycophenolate  500 mg twice daily-acute hemolytic anemia- complicated nstay in the hospital/ICU-anaphylactic reaction to IV Solu-Medrol -improved with EpiPen  although she tolerated Solu-Medrol  injection well earlier in the hospital stay.  Started on prednisone  60 to 80 mg a day- # Start Imuran  100 mg/day; # DC cellcept .   # September 2025-acute cholecystitis status postcholecystectomy.  #History of heavy menstrual cycles  HISTORY OF PRESENTING ILLNESS: with her mother.  Ambulating.  Danielle [name] 48 y.o.  female with relapsed autoimmune hemolytic anemia steroid responsive -but DAT  negative [ [suspect IgA] who presents for follow-up.   Discussed the use of AI scribe software for clinical note transcription with the patient, who gave verbal consent to proceed.  History of Present Illness   Danielle [name] is a 48 year old female with refractory warm autoimmune hemolytic anemia who presents with worsening symptomatic anemia and persistent fatigue.  Despite ongoing therapy with high-dose prednisone  (100 mg daily), she reports that the medication is no longer effective, noting persistent severe fatigue and alternating periods of feeling well and unwell. She describes profound exhaustion after minimal exertion, such as showering, which necessitates resting in bed before being able to get dressed.  She reports constant, pounding headaches involving her entire head, which she attributes to her low hemoglobin. She expresses frustration and distress regarding her ongoing symptoms, stating that her quality of life is significantly impaired.  She continues to take folic acid . She is aware of the need for ongoing transfusions. She expresses apprehension about potential side effects of chemotherapy, including hair loss.  She notes additional emotional distress due to her mother's current hospitalization in [place] for uncontrolled diabetes.       Review of Systems  Constitutional:  Negative for chills, diaphoresis, fever and weight loss.  HENT:  Negative for nosebleeds and sore throat.   Eyes:  Negative for double vision.  Respiratory:  Negative for hemoptysis and sputum production.   Cardiovascular:  Negative for chest pain, palpitations and orthopnea.  Gastrointestinal:  Positive for blood in stool and constipation. Negative for abdominal pain, diarrhea, heartburn and melena.  Genitourinary:  Negative for dysuria, frequency and urgency.  Musculoskeletal:  Negative for back pain and joint pain.  Skin: Negative.  Negative for itching and rash.  Neurological:  Positive for  dizziness, weakness and headaches. Negative for focal weakness.  Endo/Heme/Allergies:  Does not bruise/bleed  easily.  Psychiatric/Behavioral:  Negative for depression. The patient is not nervous/anxious.     MEDICAL HISTORY:  Past Medical History:  Diagnosis Date   Anemia    Depression    Hepatic steatosis 04/01/2023    SURGICAL HISTORY: Past Surgical History:  Procedure Laterality Date   ABDOMINAL HYSTERECTOMY     COLONOSCOPY WITH PROPOFOL  N/A 05/12/2022   Procedure: COLONOSCOPY WITH PROPOFOL ;  Surgeon: Jinny Carmine, MD;  Location: [facility] ENDOSCOPY;  Service: Endoscopy;  Laterality: N/A;   ESOPHAGOGASTRODUODENOSCOPY N/A 04/08/2023   Procedure: EGD (ESOPHAGOGASTRODUODENOSCOPY);  Surgeon: Toledo, Ladell POUR, MD;  Location: [facility] ENDOSCOPY;  Service: Gastroenterology;  Laterality: N/A;   ESOPHAGOGASTRODUODENOSCOPY (EGD) WITH PROPOFOL  N/A 05/12/2022   Procedure: ESOPHAGOGASTRODUODENOSCOPY (EGD) WITH PROPOFOL ;  Surgeon: Jinny Carmine, MD;  Location: [facility] ENDOSCOPY;  Service: Endoscopy;  Laterality: N/A;   IR BONE MARROW BIOPSY & ASPIRATION  05/28/2022   TONSILLECTOMY      SOCIAL HISTORY: Social History   Socioeconomic History   Marital status: Single    Spouse name: Not on file   Number of children: Not on file   Years of education: Not on file   Highest education level: Not on file  Occupational History   Not on file  Tobacco Use   Smoking status: Former    Current packs/day: 0.00    Average packs/day: 0.5 packs/day for 25.0 years (12.5 ttl pk-yrs)    Types: Cigarettes    Start date: 05/09/1997    Quit date: 05/10/2022    Years since quitting: 1.6   Smokeless tobacco: Never  Vaping Use   Vaping status: Never Used  Substance and Sexual Activity   Alcohol use: Not Currently   Drug use: Never   Sexual activity: Not Currently  Other Topics Concern   Not on file  Social History Narrative   Lives in [place] with 4 kids; stay at home [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    Social Drivers of Corporate Investment Banker Strain: Not on file  Food Insecurity: No Food Insecurity (10/23/2023)   Hunger Vital Sign    Worried About Running Out of Food in the Last Year: Never true    Ran Out of Food in the Last Year: Never true  Transportation Needs: No Transportation Needs (10/23/2023)   PRAPARE - Administrator, Civil Service (Medical): No    Lack of Transportation (Non-Medical): No  Physical Activity: Not on file  Stress: Not on file  Social Connections: Unknown (04/01/2023)   Social Connection and Isolation Panel    Frequency of Communication with Friends and Family: More than three times a week    Frequency of Social Gatherings with Friends and Family: More than three times a week    Attends Religious Services: More than 4 times per year    Active Member of [employer] or Organizations: No    Attends Banker Meetings: Never    Marital Status: Patient declined  Catering Manager Violence: Not At Risk (10/23/2023)   Humiliation, Afraid, Rape, and Kick questionnaire    Fear of Current or Ex-Partner: No    Emotionally Abused: No    Physically Abused: No    Sexually Abused: No    FAMILY HISTORY: Family History  Problem Relation Age of Onset   Breast cancer Paternal Aunt     ALLERGIES:  is allergic to dilaudid  [hydromorphone  hcl], rituxan  [rituximab ], and solu-medrol  [methylprednisolone ].  MEDICATIONS:  Current Outpatient Medications  Medication Sig Dispense Refill   acetaminophen  (  TYLENOL ) 500 MG tablet Take 500 mg by mouth every 6 (six) hours as needed.     alum & mag hydroxide-simeth (MAALOX/MYLANTA) 200-200-20 MG/5ML suspension Take 30 mLs by mouth every 6 (six) hours as needed for flatulence. 355 mL 0   calcium  carbonate (TUMS) 500 MG chewable tablet Chew 1 tablet (200 mg of elemental calcium  total) by mouth 3 (three) times daily. 90 tablet 2   folic acid  (FOLVITE ) 1 MG tablet Take 1 tablet (1 mg total) by mouth daily. 90  tablet 1   ondansetron  (ZOFRAN -ODT) 4 MG disintegrating tablet Take 1 tablet (4 mg total) by mouth every 6 (six) hours as needed for nausea. 20 tablet 0   oxyCODONE  (OXY IR/ROXICODONE ) 5 MG immediate release tablet Take 1-2 tablets (5-10 mg total) by mouth every 3 (three) hours as needed for moderate pain (pain score 4-6) or breakthrough pain. 30 tablet 0   pantoprazole  (PROTONIX ) 40 MG tablet Take 1 tablet (40 mg total) by mouth 2 (two) times daily. Do not stop medication abruptly. 90 tablet 1   polyethylene glycol powder (GLYCOLAX /MIRALAX ) 17 GM/SCOOP powder Take 17 g by mouth daily. Dissolve 1 capful (17g) in 4-8 ounces of liquid and take by mouth daily.     predniSONE  (DELTASONE ) 20 MG tablet Take 5 tablets (100mg  total) by mouth daily with food. Do not stop until recommended. 150 tablet 1   topiramate (TOPAMAX) 25 MG tablet Take 25 mg by mouth daily.     lactulose  (CHRONULAC ) 10 GM/15ML solution Take 45 mLs (30 g total) by mouth 3 (three) times daily. As needed for constipation (Patient not taking: Reported on 01/06/2024) 450 mL 0   ondansetron  (ZOFRAN ) 8 MG tablet Take 1 tablet (8 mg total) by mouth every 8 (eight) hours as needed for nausea or vomiting. Start on the third day after chemotherapy. 30 tablet 1   prochlorperazine  (COMPAZINE ) 10 MG tablet Take 1 tablet (10 mg total) by mouth every 6 (six) hours as needed for nausea or vomiting. 30 tablet 1   No current facility-administered medications for this visit.      PHYSICAL EXAMINATION:   Vitals:   01/06/24 1010  BP: 105/65  Pulse: 95  Resp: 18  Temp: 98.1 F (36.7 C)  SpO2: 100%          Filed Weights   01/06/24 1010  Weight: 198 lb 11.2 oz (90.1 kg)         Physical Exam HENT:     Head: Normocephalic and atraumatic.     Mouth/Throat:     Pharynx: No oropharyngeal exudate.  Eyes:     Pupils: Pupils are equal, round, and reactive to light.  Cardiovascular:     Rate and Rhythm: Normal rate and regular  rhythm.  Pulmonary:     Effort: Pulmonary effort is normal. No respiratory distress.     Breath sounds: Normal breath sounds. No wheezing.  Abdominal:     General: Bowel sounds are normal. There is no distension.     Palpations: Abdomen is soft. There is no mass.     Tenderness: There is no abdominal tenderness. There is no guarding or rebound.  Musculoskeletal:        General: No tenderness. Normal range of motion.     Cervical back: Normal range of motion and neck supple.  Skin:    General: Skin is warm.  Neurological:     Mental Status: She is alert and oriented to person, place, and time.  Psychiatric:  Mood and Affect: Affect normal.     LABORATORY DATA:  I have reviewed the data as listed Lab Results  Component Value Date   WBC 2.1 (L) 01/06/2024   HGB 6.7 (LL) 01/06/2024   HCT 19.1 (L) 01/06/2024   MCV 103.2 (H) 01/06/2024   PLT 90 (L) 01/06/2024   Recent Labs    07/20/23 1405 07/28/23 0935 08/05/23 0846 08/25/23 0826 10/24/23 0445 10/25/23 0514 01/06/24 1015  NA  --   --   --    < > 138 138 139  K  --   --   --    < > 3.9 3.6 4.4  CL  --   --   --    < > 107 106 103  CO2  --   --   --    < > 24 25 25   GLUCOSE  --   --   --    < > 129* 117* 109*  BUN  --   --   --    < > 11 9 11   CREATININE  --   --   --    < > 0.61 0.55 0.64  CALCIUM   --   --   --    < > 8.8* 8.6* 9.5  GFRNONAA  --   --   --    < > >60 >60 >60  PROT 6.8 6.5 6.5   < > 6.5 6.3* 6.8  ALBUMIN 4.0 3.7 3.8   < > 3.8 3.5 4.3  AST 51* 25 18   < > 252* 72* 152*  ALT 70* 78* 17   < > 396* 222* 62*  ALKPHOS 71 95 70   < > 91 71 72  BILITOT 2.1* 1.5* 1.8*   < > 3.7* 2.6* 1.5*  BILIDIR 0.4* 0.3* 0.3*  --   --   --   --   IBILI 1.7* 1.2* 1.5*  --   --   --   --    < > = values in this interval not displayed.     No results found.    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 G-4 anaphylactic reaction to Rituximab . Discontinued Tavvalisse in June secondary to elevated LFTs. AUG hide he denies use for refractory hemolytic anemia hemolytic anemia seen 2025-Discontinue Imuran .  # Given the overall improvement on 80 mg of prednisone  improved - Hb 10.8/LDH normal-recommend proceeding with prednisone  60 mg a day x 4 weeks-  - Continue folic acid .    # Discussed the need for escalating the therapy given her ongoing -refractoriness to steroids and intolerance to higher dose.  Patient has had reaction to multiple drugs including possibly IV methylprednisolone /, however tolerated solucortef.    # Discussed option of splenectomy versus chemotherapy.  Patient wants to hold off surgery-would recommend Cytoxan single agent 750 mg/m times 1 infusion I also discussed the potential side effects including but not limited to-increasing fatigue, nausea vomiting, diarrhea, hair loss, sores in the mouth, increase risk of infection and also neuropathy.  Also reviewed the multiple strategies to avoid/mitigate similar side effects including preemptive medications-for nausea vomiting.  Also discussed at length regarding good oral hygiene/hydration and in general precautions regarding duration of infections.    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   # Contsipation-continue Dulcolax MiraLAX  and add; lactulose .  #  DISPOSITION: # Blood transfusion tomorrow- 1 unit please order.  # chemo education re: cytoxan ASAP # follow up on 12/18- MD; labs- cbc/cmp; HOLD tube- chemo; D-2 blood transfusion--Dr.B   All questions were answered. The patient knows to call the clinic with any problems, questions or concerns.    Cindy JONELLE Joe, MD 01/06/2024 2:22 PM

## 2024-01-07 ENCOUNTER — Inpatient Hospital Stay: Admitting: Nurse Practitioner

## 2024-01-07 ENCOUNTER — Other Ambulatory Visit: Payer: Self-pay

## 2024-01-07 ENCOUNTER — Emergency Department
Admission: EM | Admit: 2024-01-07 | Discharge: 2024-01-07 | Disposition: A | Discharge status: Home / Self Care | Attending: Emergency Medicine | Admitting: Emergency Medicine

## 2024-01-07 ENCOUNTER — Encounter: Payer: Self-pay | Admitting: Internal Medicine

## 2024-01-07 ENCOUNTER — Inpatient Hospital Stay

## 2024-01-07 ENCOUNTER — Telehealth: Payer: Self-pay | Admitting: *Deleted

## 2024-01-07 DIAGNOSIS — D649 Anemia, unspecified: Secondary | ICD-10-CM | POA: Insufficient documentation

## 2024-01-07 DIAGNOSIS — Z5111 Encounter for antineoplastic chemotherapy: Secondary | ICD-10-CM | POA: Diagnosis not present

## 2024-01-07 DIAGNOSIS — T8092XA Unspecified transfusion reaction, initial encounter: Secondary | ICD-10-CM | POA: Diagnosis present

## 2024-01-07 DIAGNOSIS — D591 Autoimmune hemolytic anemia, unspecified: Secondary | ICD-10-CM

## 2024-01-07 DIAGNOSIS — T782XXA Anaphylactic shock, unspecified, initial encounter: Secondary | ICD-10-CM

## 2024-01-07 DIAGNOSIS — T8089XA Other complications following infusion, transfusion and therapeutic injection, initial encounter: Secondary | ICD-10-CM

## 2024-01-07 LAB — CBC WITH DIFFERENTIAL/PLATELET
Abs Immature Granulocytes: 0.05 K/uL (ref 0.00–0.07)
Basophils Absolute: 0 K/uL (ref 0.0–0.1)
Basophils Relative: 0 %
Eosinophils Absolute: 0.1 K/uL (ref 0.0–0.5)
Eosinophils Relative: 2 %
HCT: 23.1 % — ABNORMAL LOW (ref 36.0–46.0)
Hemoglobin: 8.3 g/dL — ABNORMAL LOW (ref 12.0–15.0)
Immature Granulocytes: 2 %
Lymphocytes Relative: 61 %
Lymphs Abs: 2.1 K/uL (ref 0.7–4.0)
MCH: 35.5 pg — ABNORMAL HIGH (ref 26.0–34.0)
MCHC: 35.9 g/dL (ref 30.0–36.0)
MCV: 98.7 fL (ref 80.0–100.0)
Monocytes Absolute: 0.1 K/uL (ref 0.1–1.0)
Monocytes Relative: 3 %
Neutro Abs: 1.1 K/uL — ABNORMAL LOW (ref 1.7–7.7)
Neutrophils Relative %: 32 %
Platelets: 85 K/uL — ABNORMAL LOW (ref 150–400)
RBC: 2.34 MIL/uL — ABNORMAL LOW (ref 3.87–5.11)
RDW: 20.2 % — ABNORMAL HIGH (ref 11.5–15.5)
Smear Review: NORMAL
WBC: 3.4 K/uL — ABNORMAL LOW (ref 4.0–10.5)
nRBC: 1.2 % — ABNORMAL HIGH (ref 0.0–0.2)

## 2024-01-07 LAB — URINALYSIS, W/ REFLEX TO CULTURE (INFECTION SUSPECTED)
Bilirubin Urine: NEGATIVE
Glucose, UA: NEGATIVE mg/dL
Ketones, ur: NEGATIVE mg/dL
Leukocytes,Ua: NEGATIVE
Nitrite: NEGATIVE
Protein, ur: NEGATIVE mg/dL
Specific Gravity, Urine: 1.01 (ref 1.005–1.030)
pH: 5 (ref 5.0–8.0)

## 2024-01-07 LAB — COMPREHENSIVE METABOLIC PANEL WITH GFR
ALT: 60 U/L — ABNORMAL HIGH (ref 0–44)
AST: 146 U/L — ABNORMAL HIGH (ref 15–41)
Albumin: 4.2 g/dL (ref 3.5–5.0)
Alkaline Phosphatase: 67 U/L (ref 38–126)
Anion gap: 17 — ABNORMAL HIGH (ref 5–15)
BUN: 11 mg/dL (ref 6–20)
CO2: 19 mmol/L — ABNORMAL LOW (ref 22–32)
Calcium: 8.9 mg/dL (ref 8.9–10.3)
Chloride: 104 mmol/L (ref 98–111)
Creatinine, Ser: 0.61 mg/dL (ref 0.44–1.00)
GFR, Estimated: >60 mL/min (ref >60)
Glucose, Bld: 149 mg/dL — ABNORMAL HIGH (ref 70–99)
Potassium: 3.3 mmol/L — ABNORMAL LOW (ref 3.5–5.1)
Sodium: 140 mmol/L (ref 135–145)
Total Bilirubin: 2.1 mg/dL — ABNORMAL HIGH (ref 0.0–1.2)
Total Protein: 6.5 g/dL (ref 6.5–8.1)

## 2024-01-07 LAB — RESP PANEL BY RT-PCR (RSV, FLU A&B, COVID)  RVPGX2
Influenza A by PCR: NEGATIVE
Influenza B by PCR: NEGATIVE
Resp Syncytial Virus by PCR: NEGATIVE
SARS Coronavirus 2 by RT PCR: NEGATIVE

## 2024-01-07 LAB — LIPASE, BLOOD: Lipase: 53 U/L — ABNORMAL HIGH (ref 11–51)

## 2024-01-07 MED ORDER — SODIUM CHLORIDE 0.9% IV SOLUTION
250.0000 mL | INTRAVENOUS | Status: DC
  Administered 2024-01-07: 100 mL via INTRAVENOUS
  Filled 2024-01-07: qty 250

## 2024-01-07 MED ORDER — EPINEPHRINE 0.3 MG/0.3ML IJ SOAJ
0.3000 mg | Freq: Once | INTRAMUSCULAR | Status: AC
  Administered 2024-01-07: 0.3 mg via INTRAMUSCULAR

## 2024-01-07 MED ORDER — ACETAMINOPHEN 325 MG PO TABS
650.0000 mg | ORAL_TABLET | Freq: Once | ORAL | Status: AC
  Administered 2024-01-07: 650 mg via ORAL
  Filled 2024-01-07: qty 2

## 2024-01-07 MED ORDER — ACETAMINOPHEN 500 MG PO TABS
1000.0000 mg | ORAL_TABLET | Freq: Once | ORAL | Status: AC
  Administered 2024-01-07: 1000 mg via ORAL
  Filled 2024-01-07: qty 2

## 2024-01-07 MED ORDER — METHYLPREDNISOLONE SODIUM SUCC 125 MG IJ SOLR
125.0000 mg | Freq: Once | INTRAMUSCULAR | Status: AC
  Administered 2024-01-07: 125 mg via INTRAVENOUS

## 2024-01-07 MED ORDER — FAMOTIDINE IN NACL 20-0.9 MG/50ML-% IV SOLN
20.0000 mg | Freq: Once | INTRAVENOUS | Status: AC
  Administered 2024-01-07: 20 mg via INTRAVENOUS

## 2024-01-07 MED ORDER — SODIUM CHLORIDE 0.9 % IV SOLN
Freq: Once | INTRAVENOUS | Status: AC
  Filled 2024-01-07: qty 250

## 2024-01-07 MED ORDER — DIPHENHYDRAMINE HCL 25 MG PO TABS: 25.0000 mg | ORAL_TABLET | Freq: Once | ORAL | Status: DC

## 2024-01-07 NOTE — Progress Notes (Signed)
 At 1152, patient had completed blood transfusion. When about to take IV out, patient began complaining of nausea and feeling hot. Vital signs stable. IV fluids bolus started. Florence Maxwell NP to chairside. Per [name], give 20 mg of Pepcid . (See eMAR). Patient refused Benadryl . After Pepcid  completion patient began feeling worse. Asked patient her symptoms and she mentioned,  I don't know, I just do not feel right. Patient then began having shortness of breath, chest pain and a pounding headache. Patient also became restless with chest discomfort. Concerns of hemolytic reaction. At 1159, Per [name] ordered to give 125 mg of Solumedrol. Patient then began experiencing stomach pain, nausea, feeling cold with irregular breathing .  At 1201, patient was given epinephrine  injection in thigh and started on 2L [place] . EMS called and Dr. Rennie to chairside. Symptoms began to resolve and weaned off to room air. At 1212, EMS arrived and took patient to ED. Blood bank notified and was given blood transfusion bag and tubing.

## 2024-01-07 NOTE — Telephone Encounter (Signed)
 Contacted Gilisa, patients daughter, and informed her that mom had an anaphylaxis type response after her blood transfusion today. Patient was carried to ER by EMS from the cancer center. Daughter is on her way to the ER now.

## 2024-01-07 NOTE — ED Triage Notes (Signed)
 Allergic reaction to blood transfusion, not her first blood transfusion. Pt coming from the cancer center. B+ unit was being used. Not sure why pt was having transfusion. apparently was having anaphylaxis. Hx of anxiety. EMS states no rash present. PT states she's allergic to solu Medrol  and Cancer center gave it to her, They gave famotidine  possibly as well. Patient AOX4, states she has an autoimmune disorder.   50mg  of benydral given by EMS.   138/72 99RA CBG:   That 1 epipen  actually used 2nd needle was bent.

## 2024-01-07 NOTE — ED Notes (Signed)
 Messaged EDP about tylenol  request per patient.

## 2024-01-07 NOTE — Progress Notes (Signed)
 Symptom Management Clinic  [facility] A Department of the [place]. [facility] [address] [place], [place] [address] [phone] (phone) [phone] (fax)  Patient Care Team: Donal Channing SQUIBB, FNP as PCP - General (Family Medicine) Rennie Cindy SAUNDERS, MD as Consulting Physician (Oncology)   Name of the patient: Danielle [name]  [id number]  Jul 10, 1975   Date of visit: 01/07/2024  Diagnosis- autoimmune hemolytic anemia  Chief complaint/ Reason for visit- Reaction   Heme/Onc history:  Oncology History   No problem history exists.    Interval history- patient is a 48 year old female with history of autoimmune hemolytic anemia who was receiving 1 unit of blood transfusion for hemoglobin of 6.7.  She had received the transfusion and then reported to nursing complaints of left shoulder pain and feeling bad.  She denies changes in breathing.  Reports feeling chilled/diaphoretic.  No itching or hives.  She received Tylenol  as a premedication but denied Benadryl   Review of systems- Review of Systems  Constitutional:  Positive for diaphoresis and malaise/fatigue. Negative for fever.  HENT:  Negative for sore throat.   Respiratory:  Negative for cough, shortness of breath and wheezing.   Cardiovascular:  Negative for chest pain and palpitations.  Gastrointestinal:  Negative for abdominal pain and nausea.  Genitourinary:  Negative for flank pain.  Neurological:  Positive for headaches. Negative for focal weakness.    Allergies[1]  Past Medical History:  Diagnosis Date   Anemia    Depression    Hepatic steatosis 04/01/2023   Physical exam: There were no vitals filed for this visit. Physical Exam    Assessment and plan- Patient is a 48 y.o. female who presents for evaluation of:   1.  Anaphylaxis to blood product-patient had completed 1 unit of PRBCs for hemoglobin of 6.7 due to her underlying autoimmune hemolytic  anemia refractory to prednisone  100 mg.  She is symptomatic of reaction and steroids were recommended by nursing.  She declined and nursing administered Pepcid  20 mg IV.  Upon my arrival, she was hemodynamically stable however diaphoresis. No hives, angioedema. There is concerned for hemolytic reaction.  Symptoms worsened with severe frontal headache and chest pain, hypotension, post Pepcid  and she received Solu-Medrol  125. She became distressed and irregular breathing. No loss of pulse, hives and she received epi-pen, placed on oxygen via [place], fluid bolus. Dr. Rennie to chairside. EMS was notified.She began to improve and was weaned to room air, transferred to stretcher and was transferred to ER. I notified hospital and blood bank. Recommend post transfusion reaction order set be placed.   2. Autoimmune hemolytic anemia-April 2024-severe autoimmune hemolytic anemia.  DAT negative, however, steroid responsive. Plan is to move forward with cytoxan. Given today's reaction and history of prior reactions, Dr Rennie recommends this be performed in the hospital. I will discuss with him to coordinate care.    Visit Diagnosis 1. Transfusion reaction due to allergens in blood, initial encounter   2. Anaphylaxis, initial encounter     Patient expressed understanding and was in agreement with this plan. She also understands that She can call clinic at any time with any questions, concerns, or complaints.   Thank you for allowing me to participate in the care of this very pleasant patient.   Tinnie Dawn, DNP, AGNP-C, AOCNP [facility] [phone]     [1]  Allergies Allergen Reactions   Dilaudid  [Hydromorphone  Hcl] Anxiety   Rituxan  [Rituximab ] Anaphylaxis  Solu-Medrol  [Methylprednisolone ] Anaphylaxis    Resolved after EpiPen  on 04/02/2023 (After Solu-medrol  250 mg iv, but no reaction after Solu-medrol  100 mg on 04/01/23 weird)

## 2024-01-07 NOTE — ED Provider Notes (Signed)
 [facility] Provider Note    Event Date/Time   First MD Initiated Contact with Patient 01/07/24 1235     (approximate)   History   Chief Complaint: Allergic Reaction (To blood transfusion)   HPI  Danielle [name] is a 48 y.o. female who was sent to the ED due to possible transfusion reaction while having blood transfusion for symptomatic anemia at the cancer center.  Patient reports that when she woke up this morning she had a bilateral frontal headache, fatigue, dizziness and malaise.  She went to the cancer center where she had been planned to have a blood transfusion due to symptomatic anemia with a hemoglobin in the sixes.  She received a full transfusion without any change in symptoms, and during observation afterward, when they removed her IV, she reports getting lightheaded, sweaty, nauseated.  Denies passing out.  Denies any pain.  No shortness of breath or tongue swelling or throat swelling or scratchiness.  No rash.  Cancer center reports patient was complaining of headache shortness of breath and chest pain after the transfusion, so she was given IV Pepcid  20 mg and IV Solu-Medrol  125 mg.  Symptoms persisted, so patient was given an EpiPen  after which symptoms improved.      Past Medical History:  Diagnosis Date   Anemia    Depression    Hepatic steatosis 04/01/2023    Current Outpatient Rx   Order #: [id number] Class: Historical Med   Order #: [id number] Class: Normal   Order #: [id number] Class: Print   Order #: [id number] Class: Normal   Order #: [id number] Class: Normal   Order #: [id number] Class: Normal   Order #: [id number] Class: Normal   Order #: [id number] Class: Normal   Order #: [id number] Class: Print   Order #: [id number] Class: Historical Med   Order #: [id number] Class: Print   Order #: [id number] Class: Normal   Order #: [id number] Class: Historical Med    Past Surgical History:  Procedure Laterality Date   ABDOMINAL HYSTERECTOMY      COLONOSCOPY WITH PROPOFOL  N/A 05/12/2022   Procedure: COLONOSCOPY WITH PROPOFOL ;  Surgeon: Jinny Carmine, MD;  Location: [facility];  Service: Endoscopy;  Laterality: N/A;   ESOPHAGOGASTRODUODENOSCOPY N/A 04/08/2023   Procedure: EGD (ESOPHAGOGASTRODUODENOSCOPY);  Surgeon: Toledo, Ladell POUR, MD;  Location: [facility];  Service: Gastroenterology;  Laterality: N/A;   ESOPHAGOGASTRODUODENOSCOPY (EGD) WITH PROPOFOL  N/A 05/12/2022   Procedure: ESOPHAGOGASTRODUODENOSCOPY (EGD) WITH PROPOFOL ;  Surgeon: Jinny Carmine, MD;  Location: [facility];  Service: Endoscopy;  Laterality: N/A;   IR BONE MARROW BIOPSY & ASPIRATION  05/28/2022   TONSILLECTOMY      Physical Exam   Triage Vital Signs: ED Triage Vitals  Encounter Vitals Group     BP 01/07/24 1237 95/66     Girls Systolic BP Percentile --      Girls Diastolic BP Percentile --      Boys Systolic BP Percentile --      Boys Diastolic BP Percentile --      Pulse Rate 01/07/24 1237 (!) 102     Resp 01/07/24 1237 20     Temp 01/07/24 1237 97.7 F (36.5 C)     Temp src --      SpO2 01/07/24 1237 100 %     Weight --      Height 01/07/24 1238 5' 3 (1.6 m)     Head Circumference --      Peak Flow --      Pain Score 01/07/24 1238 0     Pain  Loc --      Pain Education --      Exclude from Growth Chart --     Most recent vital signs: Vitals:   01/07/24 1237 01/07/24 1300  BP: 95/66 (!) 108/58  Pulse: (!) 102 91  Resp: 20 19  Temp: 97.7 F (36.5 C)   SpO2: 100% 99%    General: Awake, no distress.  CV:  Good peripheral perfusion.  Regular rate rhythm Resp:  Normal effort.  Clear lungs, no stridor or wheezing Abd:  No distention.  Soft nontender Other:  No rash   ED Results / Procedures / Treatments   Labs (all labs ordered are listed, but only abnormal results are displayed) Labs Reviewed  COMPREHENSIVE METABOLIC PANEL WITH GFR - Abnormal; Notable for the following components:      Result Value   Potassium 3.3 (*)    CO2  19 (*)    Glucose, Bld 149 (*)    AST 146 (*)    ALT 60 (*)    Total Bilirubin 2.1 (*)    Anion gap 17 (*)    All other components within normal limits  LIPASE, BLOOD - Abnormal; Notable for the following components:   Lipase 53 (*)    All other components within normal limits  CBC WITH DIFFERENTIAL/PLATELET - Abnormal; Notable for the following components:   WBC 3.4 (*)    RBC 2.34 (*)    Hemoglobin 8.3 (*)    HCT 23.1 (*)    MCH 35.5 (*)    RDW 20.2 (*)    Platelets 85 (*)    nRBC 1.2 (*)    Neutro Abs 1.1 (*)    All other components within normal limits  URINALYSIS, W/ REFLEX TO CULTURE (INFECTION SUSPECTED) - Abnormal; Notable for the following components:   Color, Urine YELLOW (*)    APPearance CLEAR (*)    Hgb urine dipstick SMALL (*)    Bacteria, UA RARE (*)    All other components within normal limits  RESP PANEL BY RT-PCR (RSV, FLU A&B, COVID)  [id number]     EKG Interpreted by me Sinus rhythm rate of 98.  Normal axis and intervals.  Normal QRS ST segments and T waves   RADIOLOGY    PROCEDURES:  Procedures   MEDICATIONS ORDERED IN ED: Medications  acetaminophen  (TYLENOL ) tablet 1,000 mg (has no administration in time range)     IMPRESSION / MDM / ASSESSMENT AND PLAN / ED COURSE  I reviewed the triage vital signs and the nursing notes.  DDx: Vagal episode, COVID, influenza, dehydration, AKI, electrolyte derangement  Patient's presentation is most consistent with acute presentation with potential threat to life or bodily function.  Patient sent to the ED due to nausea, lightheadedness.  No signs of hypersensitivity reaction currently or described by EMS.  Vitals unremarkable.  Symptoms are suggestive of influenza-like illness and likely a vagal episode occurring on IV removal after the blood transfusion.  Will monitor patient in the ED, check labs   ----------------------------------------- 3:19 PM on  01/07/2024 ----------------------------------------- Continues to feel well.  No evidence of any recurring hypersensitivity symptoms.  Will continue to monitor, anticipate patient will be stable for discharge and follow-up with her doctor      FINAL CLINICAL IMPRESSION(S) / ED DIAGNOSES   Final diagnoses:  Blood transfusion reaction, initial encounter  Chronic anemia     Rx / DC Orders   ED Discharge Orders     None  Note:  This document was prepared using Dragon voice recognition software and may include unintentional dictation errors.   Viviann Pastor, MD 01/07/24 1520

## 2024-01-07 NOTE — ED Provider Notes (Signed)
 Emergency department handoff note  Care of this patient was signed out to me at the end of the previous provider shift.  All pertinent patient information was conveyed and all questions were answered.  Patient pending completion of 4-hour observation.  Patient has no new or concerning symptoms.  The patient has been reexamined and is ready to be discharged.  All diagnostic results have been reviewed and discussed with the patient/family.  Care plan has been outlined and the patient/family understands all current diagnoses, results, and treatment plans.  There are no new complaints, changes, or physical findings at this time.  All questions have been addressed and answered.  Patient was instructed to, and agrees to follow-up with their primary care physician as well as return to the emergency department if any new or worsening symptoms develop.   Ekam Bonebrake K, MD 01/07/24 [phone]

## 2024-01-08 LAB — SAMPLE TO BLOOD BANK

## 2024-01-08 LAB — TRANSFUSION REACTION
DAT C3: NEGATIVE
Post RXN DAT IgG: NEGATIVE

## 2024-01-09 LAB — TYPE AND SCREEN
ABO/RH(D): B POS
Antibody Screen: POSITIVE
Donor AG Type: NEGATIVE
Unit division: 0

## 2024-01-09 LAB — BPAM RBC
Blood Product Expiration Date: 202512122359
ISSUE DATE / TIME: 202512121009
Unit Type and Rh: 202601012359
Unit Type and Rh: 7300

## 2024-01-11 ENCOUNTER — Inpatient Hospital Stay

## 2024-01-12 ENCOUNTER — Encounter: Payer: Self-pay | Admitting: Internal Medicine

## 2024-01-12 ENCOUNTER — Encounter: Payer: Self-pay | Admitting: *Deleted

## 2024-01-12 ENCOUNTER — Other Ambulatory Visit: Payer: Self-pay | Admitting: Internal Medicine

## 2024-01-12 MED ORDER — MONTELUKAST SODIUM 10 MG PO TABS: 10.0000 mg | ORAL_TABLET | Freq: Every day | ORAL | 0 refills | Status: DC

## 2024-01-12 NOTE — Progress Notes (Signed)
 Michelle-please call patient with below recommendations.  I sent the prescription for Singulair .  Please inform patient that trend avoid any further serious reactions  ------------------- Premedications: Start 1 days prior prior to treatment; Take for 3 days  *Singulair -prescription sent/take as directed *Tylenol  1 pill twice a day [OTC] *Ranitidine-75 mg twice daily [OTC] *Claritin 1 pill a day [OTC] ------------------------------------------------------

## 2024-01-13 ENCOUNTER — Other Ambulatory Visit: Payer: Self-pay | Admitting: Internal Medicine

## 2024-01-13 DIAGNOSIS — D649 Anemia, unspecified: Secondary | ICD-10-CM

## 2024-01-14 ENCOUNTER — Inpatient Hospital Stay: Admitting: Internal Medicine

## 2024-01-14 ENCOUNTER — Other Ambulatory Visit: Payer: Self-pay

## 2024-01-14 ENCOUNTER — Inpatient Hospital Stay

## 2024-01-14 ENCOUNTER — Emergency Department
Admission: EM | Admit: 2024-01-14 | Discharge: 2024-01-15 | Disposition: A | Discharge status: Home / Self Care | Source: Ambulatory Visit | Attending: Emergency Medicine | Admitting: Emergency Medicine

## 2024-01-14 ENCOUNTER — Encounter: Payer: Self-pay | Admitting: Internal Medicine

## 2024-01-14 ENCOUNTER — Telehealth: Payer: Self-pay | Admitting: Internal Medicine

## 2024-01-14 VITALS — BP 103/57 | HR 67 | Temp 98.6°F | Resp 18

## 2024-01-14 VITALS — BP 97/48 | HR 87 | Temp 98.4°F | Resp 18 | Ht 63.0 in | Wt 201.3 lb

## 2024-01-14 DIAGNOSIS — D649 Anemia, unspecified: Secondary | ICD-10-CM

## 2024-01-14 DIAGNOSIS — Z5111 Encounter for antineoplastic chemotherapy: Secondary | ICD-10-CM | POA: Diagnosis not present

## 2024-01-14 DIAGNOSIS — D591 Autoimmune hemolytic anemia, unspecified: Secondary | ICD-10-CM

## 2024-01-14 DIAGNOSIS — R531 Weakness: Secondary | ICD-10-CM | POA: Diagnosis present

## 2024-01-14 LAB — COMPREHENSIVE METABOLIC PANEL WITH GFR
ALT: 51 U/L — ABNORMAL HIGH (ref 0–44)
AST: 102 U/L — ABNORMAL HIGH (ref 15–41)
Albumin: 4 g/dL (ref 3.5–5.0)
Alkaline Phosphatase: 54 U/L (ref 38–126)
Anion gap: 9 (ref 5–15)
BUN: 10 mg/dL (ref 6–20)
CO2: 26 mmol/L (ref 22–32)
Calcium: 8.7 mg/dL — ABNORMAL LOW (ref 8.9–10.3)
Chloride: 106 mmol/L (ref 98–111)
Creatinine, Ser: 0.57 mg/dL (ref 0.44–1.00)
GFR, Estimated: >60 mL/min (ref >60)
Glucose, Bld: 147 mg/dL — ABNORMAL HIGH (ref 70–99)
Potassium: 4 mmol/L (ref 3.5–5.1)
Sodium: 140 mmol/L (ref 135–145)
Total Bilirubin: 1.2 mg/dL (ref 0.0–1.2)
Total Protein: 6.2 g/dL — ABNORMAL LOW (ref 6.5–8.1)

## 2024-01-14 LAB — CBC WITH DIFFERENTIAL (CANCER CENTER ONLY)
Abs Immature Granulocytes: 0.02 K/uL (ref 0.00–0.07)
Basophils Absolute: 0 K/uL (ref 0.0–0.1)
Basophils Relative: 0 %
Eosinophils Absolute: 0 K/uL (ref 0.0–0.5)
Eosinophils Relative: 2 %
HCT: 21.6 % — ABNORMAL LOW (ref 36.0–46.0)
Hemoglobin: 7.5 g/dL — ABNORMAL LOW (ref 12.0–15.0)
Immature Granulocytes: 1 %
Lymphocytes Relative: 48 %
Lymphs Abs: 1.1 K/uL (ref 0.7–4.0)
MCH: 34.6 pg — ABNORMAL HIGH (ref 26.0–34.0)
MCHC: 34.7 g/dL (ref 30.0–36.0)
MCV: 99.5 fL (ref 80.0–100.0)
Monocytes Absolute: 0.1 K/uL (ref 0.1–1.0)
Monocytes Relative: 2 %
Neutro Abs: 1.1 K/uL — ABNORMAL LOW (ref 1.7–7.7)
Neutrophils Relative %: 47 %
Platelet Count: 67 K/uL — ABNORMAL LOW (ref 150–400)
RBC: 2.17 MIL/uL — ABNORMAL LOW (ref 3.87–5.11)
RDW: 19 % — ABNORMAL HIGH (ref 11.5–15.5)
Smear Review: NORMAL
WBC Count: 2.3 K/uL — ABNORMAL LOW (ref 4.0–10.5)
nRBC: 1.3 % — ABNORMAL HIGH (ref 0.0–0.2)

## 2024-01-14 LAB — CBC
HCT: 19.4 % — ABNORMAL LOW (ref 36.0–46.0)
Hemoglobin: 6.7 g/dL — ABNORMAL LOW (ref 12.0–15.0)
MCH: 34.5 pg — ABNORMAL HIGH (ref 26.0–34.0)
MCHC: 34.5 g/dL (ref 30.0–36.0)
MCV: 100 fL (ref 80.0–100.0)
Platelets: 70 K/uL — ABNORMAL LOW (ref 150–400)
RBC: 1.94 MIL/uL — ABNORMAL LOW (ref 3.87–5.11)
RDW: 18.8 % — ABNORMAL HIGH (ref 11.5–15.5)
WBC: 2.4 K/uL — ABNORMAL LOW (ref 4.0–10.5)
nRBC: 0.8 % — ABNORMAL HIGH (ref 0.0–0.2)

## 2024-01-14 LAB — CMP (CANCER CENTER ONLY)
ALT: 55 U/L — ABNORMAL HIGH (ref 0–44)
AST: 114 U/L — ABNORMAL HIGH (ref 15–41)
Albumin: 4.3 g/dL (ref 3.5–5.0)
Alkaline Phosphatase: 57 U/L (ref 38–126)
Anion gap: 11 (ref 5–15)
BUN: 11 mg/dL (ref 6–20)
CO2: 25 mmol/L (ref 22–32)
Calcium: 9.4 mg/dL (ref 8.9–10.3)
Chloride: 105 mmol/L (ref 98–111)
Creatinine: 0.61 mg/dL (ref 0.44–1.00)
GFR, Estimated: >60 mL/min (ref >60)
Glucose, Bld: 86 mg/dL (ref 70–99)
Potassium: 4.2 mmol/L (ref 3.5–5.1)
Sodium: 141 mmol/L (ref 135–145)
Total Bilirubin: 1.6 mg/dL — ABNORMAL HIGH (ref 0.0–1.2)
Total Protein: 6.7 g/dL (ref 6.5–8.1)

## 2024-01-14 LAB — SAMPLE TO BLOOD BANK

## 2024-01-14 LAB — PREPARE RBC (CROSSMATCH)

## 2024-01-14 MED ORDER — DIPHENHYDRAMINE HCL 50 MG/ML IJ SOLN
50.0000 mg | Freq: Once | INTRAMUSCULAR | Status: AC
  Administered 2024-01-14: 12:00:00 50 mg via INTRAVENOUS
  Filled 2024-01-14: qty 1

## 2024-01-14 MED ORDER — CYCLOPHOSPHAMIDE CHEMO INJECTION 1 GM
750.0000 mg/m2 | Freq: Once | INTRAMUSCULAR | Status: AC
  Administered 2024-01-14: 13:00:00 1500 mg via INTRAVENOUS
  Filled 2024-01-14: qty 75

## 2024-01-14 MED ORDER — DEXAMETHASONE SOD PHOSPHATE PF 10 MG/ML IJ SOLN
10.0000 mg | Freq: Once | INTRAMUSCULAR | Status: AC
  Administered 2024-01-14: 12:00:00 10 mg via INTRAVENOUS

## 2024-01-14 MED ORDER — SODIUM CHLORIDE 0.9 % IV SOLN
INTRAVENOUS | Status: DC
  Filled 2024-01-14: qty 250

## 2024-01-14 MED ORDER — PALONOSETRON HCL INJECTION 0.25 MG/5ML
0.2500 mg | Freq: Once | INTRAVENOUS | Status: AC
  Administered 2024-01-14: 12:00:00 0.25 mg via INTRAVENOUS
  Filled 2024-01-14: qty 5

## 2024-01-14 MED ORDER — APREPITANT 130 MG/18ML IV EMUL
130.0000 mg | Freq: Once | INTRAVENOUS | Status: AC
  Administered 2024-01-14: 12:00:00 130 mg via INTRAVENOUS
  Filled 2024-01-14: qty 18

## 2024-01-14 MED ORDER — ACETAMINOPHEN 325 MG PO TABS
650.0000 mg | ORAL_TABLET | Freq: Once | ORAL | Status: AC
  Administered 2024-01-14: 12:00:00 650 mg via ORAL
  Filled 2024-01-14: qty 2

## 2024-01-14 MED ORDER — SODIUM CHLORIDE 0.9 % IV SOLN: 10.0000 mL/h | Freq: Once | INTRAVENOUS | Status: DC

## 2024-01-14 MED ORDER — ACETAMINOPHEN 500 MG PO TABS
1000.0000 mg | ORAL_TABLET | Freq: Once | ORAL | Status: AC
  Administered 2024-01-14: 20:00:00 1000 mg via ORAL
  Filled 2024-01-14: qty 2

## 2024-01-14 MED ORDER — SODIUM CHLORIDE 0.9 % IV BOLUS
1000.0000 mL | Freq: Once | INTRAVENOUS | Status: AC
  Administered 2024-01-14: 16:00:00 1000 mL via INTRAVENOUS

## 2024-01-14 MED ORDER — DIPHENHYDRAMINE HCL 50 MG/ML IJ SOLN: 25.0000 mg | Freq: Once | INTRAMUSCULAR | Status: DC

## 2024-01-14 MED ADMIN — Diphenhydramine HCl Inj 50 MG/ML: 50 mg | INTRAVENOUS | @ 22:00:00 | NDC 00641037621

## 2024-01-14 MED FILL — Diphenhydramine HCl Inj 50 MG/ML: 50.0000 mg | INTRAMUSCULAR | Qty: 1 | Status: AC

## 2024-01-14 NOTE — ED Notes (Signed)
 Pt was given 50- benadryl  premed at cancer center

## 2024-01-14 NOTE — ED Notes (Signed)
 Acuity level changed to 2 due to Hgb and pt is positive antibody. Pt blood will have to come from [facility]. This RN was called and informed by [facility]

## 2024-01-14 NOTE — Progress Notes (Signed)
 [facility] CONSULT NOTE  Patient Care Team: Donal Channing SQUIBB, FNP as PCP - General (Family Medicine) Rennie Cindy SAUNDERS, MD as Consulting Physician (Oncology)  CHIEF COMPLAINTS/PURPOSE OF CONSULTATION: ANEMIA  HEMATOLOGY HISTORY  # IRON  DEFICIENCY ANEMIA CHRONIC [since 2019] AUG 2021- hb-9; MCV- 63; Iron  sat- 3%; EGD > 7 years ago [[place]]; colonoscopy/capsule-NA; PO iron  constipates.   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  JAK2 testing negative.  However bone marrow biopsy- [MAY 2024-negative for any myelophthisis process; suggestive of hemolysis] HOLD off metformin; and detromethomorphan- Bupropion. PNH testing-NEGATIVE; complement levels.;  Hemoglobin cascade; G6PD levels-WNL.  April 2024 abdominal ultrasound showed mild splenomegaly.    # 11/07/2022-RELAPSE-start  Rituximab  X1 [OCT 11th, 2024- ]G-4-anaphylactic reaction needing EpiPen  transporting to the emergency room.-Discontinue further rituximab . JAN 1st week, 2025- .500 mg BID; mycophenolate -DC:  Imuran + tavasslise [mid March 2025]   # MARCH 2025- OFF prednisone  [ stopped/tapered OFF 10 mg a day] x2 months.  mycophenolate  500 mg twice daily-acute hemolytic anemia- complicated nstay in the hospital/ICU-anaphylactic reaction to IV Solu-Medrol -improved with EpiPen  although she tolerated Solu-Medrol  injection well earlier in the hospital stay.  Started on prednisone  60 to 80 mg a day- # Start Imuran  100 mg/day; # DC cellcept .   # September 2025-acute cholecystitis status postcholecystectomy.  #History of heavy menstrual cycles  HISTORY OF PRESENTING ILLNESS: with her mother.  Ambulating.  Danielle [name] 48 y.o.  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   Discussed the use of AI scribe software for clinical note transcription with the patient, who gave verbal consent to proceed.  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  She has relapsed autoimmune hemolytic anemia unresponsive to steroids and multiple other agents, with persistent cytopenias including hemoglobin 7.5 g/dL, platelets 32,999, and white blood cell count 2.3. She experiences significant symptoms of dyspnea, profound fatigue, and intermittent periods of being bed-bound. She also reports ongoing diarrhea and hematochezia. Her quality of life is severely impaired, describing herself as drained and unable to perform usual activities.  At her last visit, she developed an acute anaphylactic reaction requiring epinephrine  following a blood transfusion, necessitating emergency department evaluation. She remains on prednisone  without symptomatic improvement and has had adverse reactions to several prior medications.  During this visit, she expressed concern regarding further cytopenias and potential hospitalization with upcoming chemotherapy. She did not take Tylenol  prior to the visit.      Review of Systems  Constitutional:  Negative for chills, diaphoresis, fever and weight loss.  HENT:  Negative for nosebleeds and sore throat.   Eyes:  Negative for double vision.  Respiratory:  Negative for hemoptysis and sputum production.   Cardiovascular:  Negative for chest pain, palpitations and orthopnea.  Gastrointestinal:  Positive for blood in stool and constipation. Negative for abdominal pain, diarrhea, heartburn and melena.  Genitourinary:  Negative for dysuria, frequency and urgency.  Musculoskeletal:  Negative for  back pain and joint pain.  Skin: Negative.  Negative for itching and rash.  Neurological:  Positive for dizziness, weakness and headaches. Negative for focal weakness.  Endo/Heme/Allergies:  Does  not bruise/bleed easily.  Psychiatric/Behavioral:  Negative for depression. The patient is not nervous/anxious.     MEDICAL HISTORY:  Past Medical History:  Diagnosis Date   Anemia    Depression    Hepatic steatosis 04/01/2023    SURGICAL HISTORY: Past Surgical History:  Procedure Laterality Date   ABDOMINAL HYSTERECTOMY     COLONOSCOPY WITH PROPOFOL  N/A 05/12/2022   Procedure: COLONOSCOPY WITH PROPOFOL ;  Surgeon: Jinny Carmine, MD;  Location: [facility];  Service: Endoscopy;  Laterality: N/A;   ESOPHAGOGASTRODUODENOSCOPY N/A 04/08/2023   Procedure: EGD (ESOPHAGOGASTRODUODENOSCOPY);  Surgeon: Toledo, Ladell POUR, MD;  Location: [facility];  Service: Gastroenterology;  Laterality: N/A;   ESOPHAGOGASTRODUODENOSCOPY (EGD) WITH PROPOFOL  N/A 05/12/2022   Procedure: ESOPHAGOGASTRODUODENOSCOPY (EGD) WITH PROPOFOL ;  Surgeon: Jinny Carmine, MD;  Location: [facility];  Service: Endoscopy;  Laterality: N/A;   IR BONE MARROW BIOPSY & ASPIRATION  05/28/2022   TONSILLECTOMY      SOCIAL HISTORY: Social History   Socioeconomic History   Marital status: Single    Spouse name: Not on file   Number of children: Not on file   Years of education: Not on file   Highest education level: Not on file  Occupational History   Not on file  Tobacco Use   Smoking status: Former    Current packs/day: 0.00    Average packs/day: 0.5 packs/day for 25.0 years (12.5 ttl pk-yrs)    Types: Cigarettes    Start date: 05/09/1997    Quit date: 05/10/2022    Years since quitting: 1.6   Smokeless tobacco: Never  Vaping Use   Vaping status: Never Used  Substance and Sexual Activity   Alcohol use: Not Currently   Drug use: Never   Sexual activity: Not Currently  Other Topics Concern   Not on file  Social History  Narrative   Lives in [place] with 4 kids; stay at home [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    Social Drivers of Health   Tobacco Use: Medium Risk (01/14/2024)   Patient History    Smoking Tobacco Use: Former    Smokeless Tobacco Use: Never    Passive Exposure: Not on Actuary Strain: Not on file  Food Insecurity: No Food Insecurity (10/23/2023)   Epic    Worried About Programme Researcher, Broadcasting/film/video in the Last Year: Never true    Ran Out of Food in the Last Year: Never true  Transportation Needs: No Transportation Needs (10/23/2023)   Epic    Lack of Transportation (Medical): No    Lack of Transportation (Non-Medical): No  Physical Activity: Not on file  Stress: Not on file  Social Connections: Unknown (04/01/2023)   Social Connection and Isolation Panel    Frequency of Communication with Friends and Family: More than three times a week    Frequency of Social Gatherings with Friends and Family: More than three times a week    Attends Religious Services: More than 4 times per year    Active Member of [employer] or Organizations: No    Attends Banker Meetings: Never    Marital Status: Patient declined  Intimate Partner Violence: Not At Risk (10/23/2023)   Epic    Fear of Current or Ex-Partner: No    Emotionally Abused: No    Physically Abused: No    Sexually Abused: No  Depression (PHQ2-9): Medium Risk (01/14/2024)   Depression (PHQ2-9)    PHQ-2 Score: 8  Alcohol Screen: Not on file  Housing: Low  Risk (10/23/2023)   Epic    Unable to Pay for Housing in the Last Year: No    Number of Times Moved in the Last Year: 0    Homeless in the Last Year: No  Utilities: Not At Risk (10/23/2023)   Epic    Threatened with loss of utilities: No  Health Literacy: Not on file    FAMILY HISTORY: Family History  Problem Relation Age of Onset   Breast cancer Paternal Aunt     ALLERGIES:  is allergic to dilaudid  [hydromorphone  hcl], rituxan  [rituximab ], and  solu-medrol  [methylprednisolone ].  MEDICATIONS:  No current facility-administered medications for this visit.   Current Outpatient Medications  Medication Sig Dispense Refill   acetaminophen  (TYLENOL ) 500 MG tablet Take 500 mg by mouth every 6 (six) hours as needed.     alum & mag hydroxide-simeth (MAALOX/MYLANTA) 200-200-20 MG/5ML suspension Take 30 mLs by mouth every 6 (six) hours as needed for flatulence. 355 mL 0   calcium  carbonate (TUMS) 500 MG chewable tablet Chew 1 tablet (200 mg of elemental calcium  total) by mouth 3 (three) times daily. 90 tablet 2   folic acid  (FOLVITE ) 1 MG tablet Take 1 tablet (1 mg total) by mouth daily. 90 tablet 1   lactulose  (CHRONULAC ) 10 GM/15ML solution Take 45 mLs (30 g total) by mouth 3 (three) times daily. As needed for constipation (Patient taking differently: Take 30 g by mouth 3 (three) times daily as needed. As needed for constipation) 450 mL 0   montelukast  (SINGULAIR ) 10 MG tablet Take 1 tablet (10 mg total) by mouth daily. Start 1 days prior prior to treatment; Take for 3 days 10 tablet 0   ondansetron  (ZOFRAN ) 8 MG tablet Take 1 tablet (8 mg total) by mouth every 8 (eight) hours as needed for nausea or vomiting. Start on the third day after chemotherapy. 30 tablet 1   ondansetron  (ZOFRAN -ODT) 4 MG disintegrating tablet Take 1 tablet (4 mg total) by mouth every 6 (six) hours as needed for nausea. 20 tablet 0   pantoprazole  (PROTONIX ) 40 MG tablet Take 1 tablet (40 mg total) by mouth 2 (two) times daily. Do not stop medication abruptly. 90 tablet 1   polyethylene glycol powder (GLYCOLAX /MIRALAX ) 17 GM/SCOOP powder Take 17 g by mouth daily. Dissolve 1 capful (17g) in 4-8 ounces of liquid and take by mouth daily.     prochlorperazine  (COMPAZINE ) 10 MG tablet Take 1 tablet (10 mg total) by mouth every 6 (six) hours as needed for nausea or vomiting. 30 tablet 1   topiramate (TOPAMAX) 25 MG tablet Take 25 mg by mouth daily.     predniSONE  (DELTASONE ) 20 MG  tablet Take 5 tablets (100mg  total) by mouth daily with food. Do not stop until recommended. (Patient not taking: Reported on 01/14/2024) 150 tablet 1   Facility-Administered Medications Ordered in Other Visits  Medication Dose Route Frequency Provider Last Rate Last Admin   0.9 %  sodium chloride  infusion  10 mL/hr Intravenous Once Paduchowski, Kevin, MD       diphenhydrAMINE  (BENADRYL ) injection 50 mg  50 mg Intravenous Once Paduchowski, Kevin, MD          PHYSICAL EXAMINATION:   Vitals:   01/14/24 0938  BP: (!) 97/48  Pulse: 87  Resp: 18  Temp: 98.4 F (36.9 C)  SpO2: 100%          Filed Weights   01/14/24 0938  Weight: 201 lb 4.8 oz (91.3 kg)  Physical Exam HENT:     Head: Normocephalic and atraumatic.     Mouth/Throat:     Pharynx: No oropharyngeal exudate.  Eyes:     Pupils: Pupils are equal, round, and reactive to light.  Cardiovascular:     Rate and Rhythm: Normal rate and regular rhythm.  Pulmonary:     Effort: Pulmonary effort is normal. No respiratory distress.     Breath sounds: Normal breath sounds. No wheezing.  Abdominal:     General: Bowel sounds are normal. There is no distension.     Palpations: Abdomen is soft. There is no mass.     Tenderness: There is no abdominal tenderness. There is no guarding or rebound.  Musculoskeletal:        General: No tenderness. Normal range of motion.     Cervical back: Normal range of motion and neck supple.  Skin:    General: Skin is warm.  Neurological:     Mental Status: She is alert and oriented to person, place, and time.  Psychiatric:        Mood and Affect: Affect normal.     LABORATORY DATA:  I have reviewed the data as listed Lab Results  Component Value Date   WBC 2.4 (L) 01/14/2024   HGB 6.7 (L) 01/14/2024   HCT 19.4 (L) 01/14/2024   MCV 100.0 01/14/2024   PLT 70 (L) 01/14/2024   Recent Labs    07/20/23 1405 07/28/23 0935 08/05/23 0846 08/25/23 0826 01/07/24 1242  01/14/24 0946 01/14/24 1347  NA  --   --   --    < > 140 141 140  K  --   --   --    < > 3.3* 4.2 4.0  CL  --   --   --    < > 104 105 106  CO2  --   --   --    < > 19* 25 26  GLUCOSE  --   --   --    < > 149* 86 147*  BUN  --   --   --    < > 11 11 10   CREATININE  --   --   --    < > 0.61 0.61 0.57  CALCIUM   --   --   --    < > 8.9 9.4 8.7*  GFRNONAA  --   --   --    < > >60 >60 >60  PROT 6.8 6.5 6.5   < > 6.5 6.7 6.2*  ALBUMIN 4.0 3.7 3.8   < > 4.2 4.3 4.0  AST 51* 25 18   < > 146* 114* 102*  ALT 70* 78* 17   < > 60* 55* 51*  ALKPHOS 71 95 70   < > 67 57 54  BILITOT 2.1* 1.5* 1.8*   < > 2.1* 1.6* 1.2  BILIDIR 0.4* 0.3* 0.3*  --   --   --   --   IBILI 1.7* 1.2* 1.5*  --   --   --   --    < > = values in this interval not displayed.     No results found.    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 G-4 anaphylactic reaction to Rituximab . Discontinued Tavvalisse in June secondary to elevated LFTs. AUG hide he denies use for refractory hemolytic anemia hemolytic anemia  seen 2025-Discontinue Imuran .  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 Current therapy ineffective, necessitating chemotherapy. Discussed option of splenectomy versus chemotherapy.  Patient wants to hold off surgery-would recommend Cytoxan  single agent 750 mg/m times 1 infusion I also discussed the potential side effects including but not limited to-increasing fatigue, nausea vomiting, diarrhea, hair loss, sores in the mouth, increase risk of infection and also neuropathy.  Also reviewed the multiple strategies to avoid/mitigate similar side effects including preemptive medications-for nausea vomiting. Discussed risks of chemotherapy, including cytopenias and hospitalization.- Discontinued  prednisone  due to lack of efficacy.   # Today-pt will go to emergency room visit for blood transfusion post-chemotherapy.   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   # Contsipation-continue Dulcolax MiraLAX  and add; lactulose .  # DISPOSITION: # chemo today # ER today after chemo for blood transfusion # follow up  next week Monday- Tuesday- Morna- APP- labs- cbc/bmp; HOLD tube- d-2 possible blood- # follow up  in 2 weeks- Tuesday-MD;  labs- cbc/bmp; HOLD tube- d-2 possible blood-Dr.B  # Will add undeyca on 12/19- approved by insurance.    All questions were answered. The patient knows to call the clinic with any problems, questions or concerns.    Cindy JONELLE Joe, MD 01/14/2024 8:27 PM

## 2024-01-14 NOTE — Progress Notes (Signed)
 Fatigue/weakness: YES Dyspena: YES Light headedness: YES Blood in stool: AT TIMES BRIGHT RED   Pt states she was unsure if she needed to keep taking the prednisone  and hasn't taken since last visit.

## 2024-01-14 NOTE — Telephone Encounter (Signed)
 Per secure chat 12/18 copied over for scheduling purposes:  DISPOSITION: # chemo today # ER today after chemo for blood transfusion # follow up next week Monday- Tuesday- [facility]- APP- labs- cbc/bmp; HOLD tube- d-2 possible blood- # follow up in 2 weeks- Tuesday-MD; labs- cbc/bmp; HOLD tube- d-2 possible blood-Dr.B   Scheduled with modifications due to provider/ chair schedule.

## 2024-01-14 NOTE — ED Provider Notes (Signed)
 11:28 PM  Assumed care at shift change.  Patient with history of hemolytic anemia requiring multiple transfusions and followed by hematology who presents to the emergency department for routine blood transfusion.  Patient states last time she was at the cancer center and had a transfusion she had a potential anaphylactic reaction requiring Solu-Medrol , epinephrine , Benadryl .  Patient given Benadryl  and Tylenol  here prior to getting transfusion.  Blood will need to run over 2 hours and then we will monitor her for 1 hour after to ensure no reaction.  Anticipate if patient does well that she can be discharged with outpatient follow-up.  3:10 AM  Pt continues to be asymptomatic, hemodynamically stable.  No lip or tongue swelling, difficulty breathing, rash, itching.  Anticipate if she continues to do well, will discharge at 3:45 AM.   At this time, I do not feel there is any life-threatening condition present. I reviewed all nursing notes, vitals, pertinent previous records.  All lab and urine results, EKGs, imaging ordered have been independently reviewed and interpreted by myself.  I reviewed all available radiology reports from any imaging ordered this visit.  Based on my assessment, I feel the patient is safe to be discharged home without further emergent workup and can continue workup as an outpatient as needed. Discussed all findings, treatment plan as well as usual and customary return precautions.  They verbalize understanding and are comfortable with this plan.  Outpatient follow-up has been provided as needed.  All questions have been answered.    Ashrita Chrismer, Josette SAILOR, DO 01/15/24 [phone]

## 2024-01-14 NOTE — Discharge Instructions (Addendum)
 You have received a blood transfusion in the emergency department.  Please follow-up with Dr. Brahmanday/oncology as scheduled.  Return to the emergency department for any worsening weakness, or any other symptom personally concerning to yourself.

## 2024-01-14 NOTE — Progress Notes (Signed)
 Patient was given Cytoxan  and tolerated without issue.  RN transported patient to ED for blood transfusion and ED RN was given report including pre-meds patient was already given at the cancer center.

## 2024-01-14 NOTE — Patient Instructions (Signed)
 [facility] - A DEPT OF [facility]  Discharge Instructions: Thank you for choosing [facility] to provide your oncology and hematology care.  If you have a lab appointment with the Cancer Center, please go directly to the Cancer Center and check in at the registration area.  Wear comfortable clothing and clothing appropriate for easy access to any Portacath or PICC line.   We strive to give you quality time with your provider. You may need to reschedule your appointment if you arrive late (15 or more minutes).  Arriving late affects you and other patients whose appointments are after yours.  Also, if you miss three or more appointments without notifying the office, you may be dismissed from the clinic at the providers discretion.      For prescription refill requests, have your pharmacy contact our office and allow 72 hours for refills to be completed.    Today you received the following chemotherapy and/or immunotherapy agents Cytoxan /Cyclophosphamide       To help prevent nausea and vomiting after your treatment, we encourage you to take your nausea medication as directed.  BELOW ARE SYMPTOMS THAT SHOULD BE REPORTED IMMEDIATELY: *FEVER GREATER THAN 100.4 F (38 C) OR HIGHER *CHILLS OR SWEATING *NAUSEA AND VOMITING THAT IS NOT CONTROLLED WITH YOUR NAUSEA MEDICATION *UNUSUAL SHORTNESS OF BREATH *UNUSUAL BRUISING OR BLEEDING *URINARY PROBLEMS (pain or burning when urinating, or frequent urination) *BOWEL PROBLEMS (unusual diarrhea, constipation, pain near the anus) TENDERNESS IN MOUTH AND THROAT WITH OR WITHOUT PRESENCE OF ULCERS (sore throat, sores in mouth, or a toothache) UNUSUAL RASH, SWELLING OR PAIN  UNUSUAL VAGINAL DISCHARGE OR ITCHING   Items with * indicate a potential emergency and should be followed up as soon as possible or go to the Emergency Department if any problems should occur.  Please show the CHEMOTHERAPY ALERT CARD or  IMMUNOTHERAPY ALERT CARD at check-in to the Emergency Department and triage nurse.  Should you have questions after your visit or need to cancel or reschedule your appointment, please contact [facility] - A DEPT OF [facility]  [phone] and follow the prompts.  Office hours are 8:00 a.m. to 4:30 p.m. Monday - Friday. Please note that voicemails left after 4:00 p.m. may not be returned until the following business day.  We are closed weekends and major holidays. You have access to a nurse at all times for urgent questions. Please call the main number to the clinic [phone] and follow the prompts.  For any non-urgent questions, you may also contact your provider using MyChart. We now offer e-Visits for anyone 54 and older to request care online for non-urgent symptoms. For details visit [email or web address].   Also download the MyChart app! Go to the app store, search MyChart, open the app, select [place], and log in with your MyChart username and password.  Cyclophosphamide  Injection What is this medication? CYCLOPHOSPHAMIDE  (sye kloe FOSS fa mide) treats some types of cancer. It works by slowing down the growth of cancer cells. This medicine may be used for other purposes; ask your health care provider or pharmacist if you have questions. COMMON BRAND NAME(S): Cyclophosphamide , Cytoxan , Neosar  What should I tell my care team before I take this medication? They need to know if you have any of these conditions: Heart disease Irregular heartbeat or rhythm Infection Kidney problems Liver disease Low blood cell levels (white cells, platelets, or red blood cells) Lung  disease Previous radiation Trouble passing urine An unusual or allergic reaction to cyclophosphamide , other medications, foods, dyes, or preservatives Pregnant or trying to get pregnant Breast-feeding How should I use this medication? This medication is injected into a vein. It  is given by your care team in a hospital or clinic setting. Talk to your care team about the use of this medication in children. Special care may be needed. Overdosage: If you think you have taken too much of this medicine contact a poison control center or emergency room at once. NOTE: This medicine is only for you. Do not share this medicine with others. What if I miss a dose? Keep appointments for follow-up doses. It is important not to miss your dose. Call your care team if you are unable to keep an appointment. What may interact with this medication? Amphotericin B Amiodarone Azathioprine  Certain antivirals for HIV or hepatitis Certain medications for blood pressure, such as enalapril, lisinopril, quinapril Cyclosporine Diuretics Etanercept Indomethacin Medications that relax muscles Metronidazole Natalizumab Tamoxifen Warfarin This list may not describe all possible interactions. Give your health care provider a list of all the medicines, herbs, non-prescription drugs, or dietary supplements you use. Also tell them if you smoke, drink alcohol, or use illegal drugs. Some items may interact with your medicine. What should I watch for while using this medication? This medication may make you feel generally unwell. This is not uncommon as chemotherapy can affect healthy cells as well as cancer cells. Report any side effects. Continue your course of treatment even though you feel ill unless your care team tells you to stop. You may need blood work while you are taking this medication. This medication may increase your risk of getting an infection. Call your care team for advice if you get a fever, chills, sore throat, or other symptoms of a cold or flu. Do not treat yourself. Try to avoid being around people who are sick. Avoid taking medications that contain aspirin, acetaminophen , ibuprofen, naproxen, or ketoprofen unless instructed by your care team. These medications may hide a  fever. Be careful brushing or flossing your teeth or using a toothpick because you may get an infection or bleed more easily. If you have any dental work done, tell your dentist you are receiving this medication. Drink water or other fluids as directed. Urinate often, even at night. Some products may contain alcohol. Ask your care team if this medication contains alcohol. Be sure to tell all care teams you are taking this medicine. Certain medicines, like metronidazole and disulfiram, can cause an unpleasant reaction when taken with alcohol. The reaction includes flushing, headache, nausea, vomiting, sweating, and increased thirst. The reaction can last from 30 minutes to several hours. Talk to your care team if you wish to become pregnant or think you might be pregnant. This medication can cause serious birth defects if taken during pregnancy and for 1 year after the last dose. A negative pregnancy test is required before starting this medication. A reliable form of contraception is recommended while taking this medication and for 1 year after the last dose. Talk to your care team about reliable forms of contraception. Do not father a child while taking this medication and for 4 months after the last dose. Use a condom during this time period. Do not breast-feed while taking this medication or for 1 week after the last dose. This medication may cause infertility. Talk to your care team if you are concerned about your fertility. Talk to your  care team about your risk of cancer. You may be more at risk for certain types of cancer if you take this medication. What side effects may I notice from receiving this medication? Side effects that you should report to your care team as soon as possible: Allergic reactions--skin rash, itching, hives, swelling of the face, lips, tongue, or throat Dry cough, shortness of breath or trouble breathing Heart failure--shortness of breath, swelling of the ankles, feet, or  hands, sudden weight gain, unusual weakness or fatigue Heart muscle inflammation--unusual weakness or fatigue, shortness of breath, chest pain, fast or irregular heartbeat, dizziness, swelling of the ankles, feet, or hands Heart rhythm changes--fast or irregular heartbeat, dizziness, feeling faint or lightheaded, chest pain, trouble breathing Infection--fever, chills, cough, sore throat, wounds that don't heal, pain or trouble when passing urine, general feeling of discomfort or being unwell Kidney injury--decrease in the amount of urine, swelling of the ankles, hands, or feet Liver injury--right upper belly pain, loss of appetite, nausea, light-colored stool, dark yellow or brown urine, yellowing skin or eyes, unusual weakness or fatigue Low red blood cell level--unusual weakness or fatigue, dizziness, headache, trouble breathing Low sodium level--muscle weakness, fatigue, dizziness, headache, confusion Red or dark brown urine Unusual bruising or bleeding Side effects that usually do not require medical attention (report to your care team if they continue or are bothersome): Hair loss Irregular menstrual cycles or spotting Loss of appetite Nausea Pain, redness, or swelling with sores inside the mouth or throat Vomiting This list may not describe all possible side effects. Call your doctor for medical advice about side effects. You may report side effects to FDA at 1-800-FDA-1088. Where should I keep my medication? This medication is given in a hospital or clinic. It will not be stored at home. NOTE: This sheet is a summary. It may not cover all possible information. If you have questions about this medicine, talk to your doctor, pharmacist, or health care provider.  2024 Elsevier/Gold Standard (2021-05-31 00:00:00)

## 2024-01-14 NOTE — Progress Notes (Signed)
 Patient has tolerated decadron  in the past ok to give as a premed per Dr onnie

## 2024-01-14 NOTE — ED Provider Notes (Signed)
 [facility] Provider Note    Event Date/Time   First MD Initiated Contact with Patient 01/14/24 1555     (approximate)  History   Chief Complaint: Weakness  HPI  Danielle [name] is a 48 y.o. female with a past medical history of hemolytic anemia currently undergoing treatment at the cancer center presents to the emergency department for a blood transfusion.  Patient had a session of chemotherapy today, had lab work today showing a low hemoglobin as the patient has been feeling weak I sent the patient to the emergency department for a blood transfusion.  Normally they would transfuse at the cancer center however the patient during her last transfusion had a potential anaphylactic reaction.  Patient states she began experiencing tingling in the arm during her transfusion and got sweaty.  Patient required IM epinephrine  as well as Benadryl .  Patient states however before the event occurred she received IV Solu-Medrol  and she states she has a known allergy to Solu-Medrol  and she believes that Solu-Medrol  could be the cause for the reaction.  Patient denies any symptoms today besides generalized weakness.  Physical Exam   Triage Vital Signs: ED Triage Vitals  Encounter Vitals Group     BP 01/14/24 1344 (!) 100/53     Girls Systolic BP Percentile --      Girls Diastolic BP Percentile --      Boys Systolic BP Percentile --      Boys Diastolic BP Percentile --      Pulse Rate 01/14/24 1343 86     Resp 01/14/24 1343 17     Temp 01/14/24 1343 98.4 F (36.9 C)     Temp Source 01/14/24 1343 Oral     SpO2 01/14/24 1343 99 %     Weight 01/14/24 1343 202 lb (91.6 kg)     Height 01/14/24 1343 5' 3 (1.6 m)     Head Circumference --      Peak Flow --      Pain Score 01/14/24 1343 4     Pain Loc --      Pain Education --      Exclude from Growth Chart --     Most recent vital signs: Vitals:   01/14/24 1530 01/14/24 1536  BP: (!) 98/57   Pulse: 73   Resp: 16    Temp:    SpO2: 100% 100%    General: Awake, no distress.  CV:  Good peripheral perfusion.  Regular rate and rhythm  Resp:  Normal effort.  Equal breath sounds bilaterally.  Abd:  No distention.  Soft, nontender.   ED Results / Procedures / Treatments   MEDICATIONS ORDERED IN ED: Medications  0.9 %  sodium chloride  infusion (has no administration in time range)  diphenhydrAMINE  (BENADRYL ) injection 25 mg (has no administration in time range)  sodium chloride  0.9 % bolus 1,000 mL (has no administration in time range)     IMPRESSION / MDM / ASSESSMENT AND PLAN / ED COURSE  I reviewed the triage vital signs and the nursing notes.  Patient's presentation is most consistent with acute presentation with potential threat to life or bodily function.  Patient presents to the emergency department for weakness and a low hemoglobin.  Patient is undergoing chemotherapy and treatment for hemolytic anemia.  Patient's hemoglobin currently 6.7 in the emergency department with a platelet count of 70.  Chemistry shows no significant finding.  Patient's only complaint is generalized weakness.  Patient had a potential  anaphylactic reaction so she was sent to the emergency department to have her transfusion.  I spoke to Dr. Rennie who recommends 1 unit packed red blood cells and believe the patient is safe for discharge home following transfusion.  Given the patient's possible allergic reaction previously we will dose 50 mg of IV Benadryl  prior to her transfusion.  We will run the transfusion over 2 hours opposed to 1 hour and closely monitor throughout.  Patient's blood has finally arrived.  Patient being pretreated with 50 mg of IV Benadryl  20 minutes prior to infusion.  We will continue to monitor in the emergency department for 1 hour after the infusion to ensure that there is no reaction.  Patient agreeable to plan.  Patient care signed out to oncoming provider.  CRITICAL CARE Performed by: Franky Moores   Total critical care time: 30 minutes  Critical care time was exclusive of separately billable procedures and treating other patients.  Critical care was necessary to treat or prevent imminent or life-threatening deterioration.  Critical care was time spent personally by me on the following activities: development of treatment plan with patient and/or surrogate as well as nursing, discussions with consultants, evaluation of patient's response to treatment, examination of patient, obtaining history from patient or surrogate, ordering and performing treatments and interventions, ordering and review of laboratory studies, ordering and review of radiographic studies, pulse oximetry and re-evaluation of patient's condition.   FINAL CLINICAL IMPRESSION(S) / ED DIAGNOSES   Symptomatic anemia    Note:  This document was prepared using Dragon voice recognition software and may include unintentional dictation errors.   Moores Franky, MD 01/14/24 2226

## 2024-01-14 NOTE — ED Triage Notes (Signed)
 Pt here from cancer center needing a blood transfusion, hgb 7.5 there. Pt endorses weakness. Pt also states she had a reaction with her last transfusion. Pt stable in triage.

## 2024-01-14 NOTE — Assessment & Plan Note (Addendum)
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 G-4 anaphylactic reaction to Rituximab . Discontinued Tavvalisse in June secondary to elevated LFTs. AUG hide he denies use for refractory hemolytic anemia hemolytic anemia seen 2025-Discontinue Imuran .  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 Current therapy ineffective, necessitating chemotherapy. Discussed option of splenectomy versus chemotherapy.  Patient wants to hold off surgery-would recommend Cytoxan  single agent 750 mg/m times 1 infusion I also discussed the potential side effects including but not limited to-increasing fatigue, nausea vomiting, diarrhea, hair loss, sores in the mouth, increase risk of infection and also neuropathy.  Also reviewed the multiple strategies to avoid/mitigate similar side effects including preemptive medications-for nausea vomiting. Discussed risks of chemotherapy, including cytopenias and hospitalization.- Discontinued prednisone  due to lack of efficacy.   # Today-pt will go to emergency room visit for blood transfusion post-chemotherapy.   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   # Contsipation-continue Dulcolax MiraLAX  and add; lactulose .  # DISPOSITION: # chemo today # ER today after chemo for blood transfusion # follow up  next week Monday- Tuesday- Morna- APP- labs- cbc/bmp; HOLD tube- d-2 possible blood- # follow up  in 2 weeks- Tuesday-MD;  labs- cbc/bmp; HOLD tube- d-2 possible blood-Dr.B  # Will add undeyca on 12/19- approved by insurance.

## 2024-01-15 ENCOUNTER — Inpatient Hospital Stay

## 2024-01-15 DIAGNOSIS — D591 Autoimmune hemolytic anemia, unspecified: Secondary | ICD-10-CM

## 2024-01-15 DIAGNOSIS — Z5111 Encounter for antineoplastic chemotherapy: Secondary | ICD-10-CM | POA: Diagnosis not present

## 2024-01-15 MED ORDER — PEGFILGRASTIM-CBQV 6 MG/0.6ML ~~LOC~~ SOSY
6.0000 mg | PREFILLED_SYRINGE | Freq: Once | SUBCUTANEOUS | Status: AC
  Administered 2024-01-15: 6 mg via SUBCUTANEOUS
  Filled 2024-01-15: qty 0.6

## 2024-01-18 ENCOUNTER — Inpatient Hospital Stay

## 2024-01-18 ENCOUNTER — Inpatient Hospital Stay: Admitting: Internal Medicine

## 2024-01-18 LAB — TYPE AND SCREEN
ABO/RH(D): B POS
Antibody Screen: POSITIVE
Donor AG Type: NEGATIVE
Donor AG Type: NEGATIVE
Unit division: 0
Unit division: 0
Unit division: 0

## 2024-01-18 LAB — BPAM RBC
Blood Product Expiration Date: 202601012359
Blood Product Expiration Date: 202601112359
Blood Product Expiration Date: 202601112359
ISSUE DATE / TIME: 202512182224
Unit Type and Rh: 5100
Unit Type and Rh: 7300
Unit Type and Rh: 7300

## 2024-01-19 ENCOUNTER — Inpatient Hospital Stay (HOSPITAL_BASED_OUTPATIENT_CLINIC_OR_DEPARTMENT_OTHER): Admitting: Nurse Practitioner

## 2024-01-19 ENCOUNTER — Other Ambulatory Visit: Payer: Self-pay

## 2024-01-19 ENCOUNTER — Encounter: Payer: Self-pay | Admitting: Nurse Practitioner

## 2024-01-19 ENCOUNTER — Other Ambulatory Visit: Payer: Self-pay | Admitting: *Deleted

## 2024-01-19 ENCOUNTER — Inpatient Hospital Stay

## 2024-01-19 VITALS — BP 103/63 | HR 100 | Temp 98.8°F | Resp 16 | Wt 202.0 lb

## 2024-01-19 DIAGNOSIS — Z09 Encounter for follow-up examination after completed treatment for conditions other than malignant neoplasm: Secondary | ICD-10-CM

## 2024-01-19 DIAGNOSIS — D649 Anemia, unspecified: Secondary | ICD-10-CM | POA: Diagnosis not present

## 2024-01-19 DIAGNOSIS — D591 Autoimmune hemolytic anemia, unspecified: Secondary | ICD-10-CM | POA: Diagnosis not present

## 2024-01-19 DIAGNOSIS — Z5111 Encounter for antineoplastic chemotherapy: Secondary | ICD-10-CM | POA: Diagnosis not present

## 2024-01-19 LAB — CBC WITH DIFFERENTIAL (CANCER CENTER ONLY)
Abs Immature Granulocytes: 0.2 K/uL — ABNORMAL HIGH (ref 0.00–0.07)
Basophils Absolute: 0 K/uL (ref 0.0–0.1)
Basophils Relative: 0 %
Eosinophils Absolute: 0.1 K/uL (ref 0.0–0.5)
Eosinophils Relative: 2 %
HCT: 23.9 % — ABNORMAL LOW (ref 36.0–46.0)
Hemoglobin: 8.1 g/dL — ABNORMAL LOW (ref 12.0–15.0)
Immature Granulocytes: 3 %
Lymphocytes Relative: 15 %
Lymphs Abs: 1.1 K/uL (ref 0.7–4.0)
MCH: 33.5 pg (ref 26.0–34.0)
MCHC: 33.9 g/dL (ref 30.0–36.0)
MCV: 98.8 fL (ref 80.0–100.0)
Monocytes Absolute: 0.3 K/uL (ref 0.1–1.0)
Monocytes Relative: 4 %
Neutro Abs: 5.3 K/uL (ref 1.7–7.7)
Neutrophils Relative %: 76 %
Platelet Count: 68 K/uL — ABNORMAL LOW (ref 150–400)
RBC: 2.42 MIL/uL — ABNORMAL LOW (ref 3.87–5.11)
RDW: 19 % — ABNORMAL HIGH (ref 11.5–15.5)
Smear Review: NORMAL
WBC Count: 7 K/uL (ref 4.0–10.5)
nRBC: 2 % — ABNORMAL HIGH (ref 0.0–0.2)

## 2024-01-19 LAB — BASIC METABOLIC PANEL - CANCER CENTER ONLY
Anion gap: 14 (ref 5–15)
BUN: 15 mg/dL (ref 6–20)
CO2: 23 mmol/L (ref 22–32)
Calcium: 9.2 mg/dL (ref 8.9–10.3)
Chloride: 102 mmol/L (ref 98–111)
Creatinine: 0.68 mg/dL (ref 0.44–1.00)
GFR, Estimated: >60 mL/min
Glucose, Bld: 147 mg/dL — ABNORMAL HIGH (ref 70–99)
Potassium: 4.2 mmol/L (ref 3.5–5.1)
Sodium: 139 mmol/L (ref 135–145)

## 2024-01-19 LAB — SAMPLE TO BLOOD BANK

## 2024-01-19 LAB — LACTATE DEHYDROGENASE: LDH: 1728 U/L — ABNORMAL HIGH (ref 105–235)

## 2024-01-19 NOTE — Progress Notes (Signed)
  [facility] CONSULT NOTE  Patient Care Team: Donal Channing SQUIBB, FNP as PCP - General (Family Medicine) Rennie Cindy SAUNDERS, MD as Consulting Physician (Oncology)  CHIEF COMPLAINTS/PURPOSE OF CONSULTATION: ANEMIA  HEMATOLOGY HISTORY  # IRON  DEFICIENCY ANEMIA CHRONIC [since 2019] AUG 2021- hb-9; MCV- 63; Iron  sat- 3%; EGD > 7 years ago [[place]]; colonoscopy/capsule-NA; PO iron  constipates.   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  JAK2 testing negative.  However bone marrow biopsy- [MAY 2024-negative for any myelophthisis process; suggestive of hemolysis] HOLD off metformin; and detromethomorphan- Bupropion. PNH testing-NEGATIVE; complement levels.;  Hemoglobin cascade; G6PD levels-WNL.  April 2024 abdominal ultrasound showed mild splenomegaly.    # 11/07/2022-RELAPSE-start  Rituximab  X1 [OCT 11th, 2024- ]G-4- anaphylactic reaction needing EpiPen  transporting to the emergency room.-Discontinue further rituximab . JAN 1st week, 2025- 500 mg BID; mycophenolate - DC: Imuran  + tavasslise [mid March 2025]   # MARCH 2025- OFF prednisone  [stopped/tapered OFF 10 mg a day] x 2 months. Mycophenolate  500 mg twice daily-acute hemolytic anemia- complicated stay in the hospital/ICU-anaphylactic reaction to IV Solu-Medrol - improved with EpiPen  although she tolerated Solu-Medrol  injection well earlier in the hospital stay.  Started on prednisone  60 to 80 mg a day- # Start Imuran  100 mg/day; # DC cellcept .   # September 2025-acute cholecystitis status postcholecystectomy.  # History of heavy menstrual cycles  HISTORY OF PRESENTING ILLNESS: unaccompanied. Ambulating.  Danielle [name] 48 y.o.  female with relapsed autoimmune hemolytic anemia steroid refractory - but DAT  negative [suspect IgA] who presents for follow-up. She is now s/p cycle 1 of cytoxan  chemotherapy with udenyca  support who returns to clinic for follow up. She has ongoing headache and shortness of breath but not worse.     Review of Systems  Constitutional:  Negative for chills, diaphoresis, fever and weight loss.  HENT:  Negative for nosebleeds and sore throat.   Eyes:  Negative for double vision.  Respiratory:  Negative for hemoptysis and sputum production.   Cardiovascular:  Negative for chest pain, palpitations and orthopnea.  Gastrointestinal:  Positive for blood in stool (hemorrhoids) and constipation. Negative for abdominal pain, diarrhea, heartburn and melena.  Genitourinary:  Negative for dysuria, frequency and urgency.  Musculoskeletal:  Negative for back pain and joint pain.  Skin: Negative.  Negative for itching and rash.  Neurological:  Positive for dizziness, weakness and headaches. Negative for focal weakness.  Endo/Heme/Allergies:  Does not bruise/bleed easily.  Psychiatric/Behavioral:  Negative for depression. The patient is not nervous/anxious.     MEDICAL HISTORY:  Past Medical History:  Diagnosis Date   Anemia    Depression    Hepatic steatosis 04/01/2023    SURGICAL HISTORY: Past Surgical History:  Procedure Laterality Date   ABDOMINAL HYSTERECTOMY     COLONOSCOPY WITH PROPOFOL  N/A 05/12/2022   Procedure: COLONOSCOPY WITH PROPOFOL ;  Surgeon: Jinny Carmine, MD;  Location: [facility];  Service: Endoscopy;  Laterality: N/A;   ESOPHAGOGASTRODUODENOSCOPY N/A 04/08/2023   Procedure: EGD (ESOPHAGOGASTRODUODENOSCOPY);  Surgeon: Toledo, Ladell POUR, MD;  Location: [facility] ENDOSCOPY;  Service: Gastroenterology;  Laterality: N/A;   ESOPHAGOGASTRODUODENOSCOPY (EGD) WITH PROPOFOL  N/A 05/12/2022   Procedure: ESOPHAGOGASTRODUODENOSCOPY (EGD) WITH PROPOFOL ;  Surgeon: Jinny Carmine, MD;  Location: [facility] ENDOSCOPY;  Service: Endoscopy;  Laterality: N/A;   IR BONE MARROW BIOPSY &  ASPIRATION  05/28/2022   TONSILLECTOMY  SOCIAL HISTORY: Social History   Socioeconomic History   Marital status: Single    Spouse name: Not on file   Number of children: Not on file   Years of education: Not on file   Highest education level: Not on file  Occupational History   Not on file  Tobacco Use   Smoking status: Former    Current packs/day: 0.00    Average packs/day: 0.5 packs/day for 25.0 years (12.5 ttl pk-yrs)    Types: Cigarettes    Start date: 05/09/1997    Quit date: 05/10/2022    Years since quitting: 1.6   Smokeless tobacco: Never  Vaping Use   Vaping status: Never Used  Substance and Sexual Activity   Alcohol use: Not Currently   Drug use: Never   Sexual activity: Not Currently  Other Topics Concern   Not on file  Social History Narrative   Lives in [place] with 4 kids; stay at home [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    Social Drivers of Health   Tobacco Use: Medium Risk (01/19/2024)   Patient History    Smoking Tobacco Use: Former    Smokeless Tobacco Use: Never    Passive Exposure: Not on file  Financial Resource Strain: Not on file  Food Insecurity: No Food Insecurity (10/23/2023)   Epic    Worried About Programme Researcher, Broadcasting/film/video in the Last Year: Never true    Ran Out of Food in the Last Year: Never true  Transportation Needs: No Transportation Needs (10/23/2023)   Epic    Lack of Transportation (Medical): No    Lack of Transportation (Non-Medical): No  Physical Activity: Not on file  Stress: Not on file  Social Connections: Unknown (04/01/2023)   Social Connection and Isolation Panel    Frequency of Communication with Friends and Family: More than three times a week    Frequency of Social Gatherings with Friends and Family: More than three times a week    Attends Religious Services: More than 4 times per year    Active Member of Clubs or Organizations: No    Attends Banker Meetings: Never    Marital Status: Patient  declined  Catering Manager Violence: Not At Risk (10/23/2023)   Epic    Fear of Current or Ex-Partner: No    Emotionally Abused: No    Physically Abused: No    Sexually Abused: No  Depression (PHQ2-9): Low Risk (01/19/2024)   Depression (PHQ2-9)    PHQ-2 Score: 0  Recent Concern: Depression (PHQ2-9) - Medium Risk (01/14/2024)   Depression (PHQ2-9)    PHQ-2 Score: 8  Alcohol Screen: Not on file  Housing: Low Risk (10/23/2023)   Epic    Unable to Pay for Housing in the Last Year: No    Number of Times Moved in the Last Year: 0    Homeless in the Last Year: No  Utilities: Not At Risk (10/23/2023)   Epic    Threatened with loss of utilities: No  Health Literacy: Not on file    FAMILY HISTORY: Family History  Problem Relation Age of Onset   Breast cancer Paternal Aunt     ALLERGIES:  is allergic to dilaudid  [hydromorphone  hcl], rituxan  [rituximab ], and solu-medrol  [methylprednisolone ].  MEDICATIONS:  Current Outpatient Medications  Medication Sig Dispense Refill   folic acid  (FOLVITE ) 1 MG tablet Take 1 tablet (1 mg total) by mouth daily. 90 tablet 1   acetaminophen  (TYLENOL ) 500 MG tablet Take 500 mg by  mouth every 6 (six) hours as needed. (Patient not taking: Reported on 01/19/2024)     alum & mag hydroxide-simeth (MAALOX/MYLANTA) 200-200-20 MG/5ML suspension Take 30 mLs by mouth every 6 (six) hours as needed for flatulence. (Patient not taking: Reported on 01/19/2024) 355 mL 0   calcium  carbonate (TUMS) 500 MG chewable tablet Chew 1 tablet (200 mg of elemental calcium  total) by mouth 3 (three) times daily. (Patient not taking: Reported on 01/19/2024) 90 tablet 2   lactulose  (CHRONULAC ) 10 GM/15ML solution Take 45 mLs (30 g total) by mouth 3 (three) times daily. As needed for constipation (Patient not taking: Reported on 01/19/2024) 450 mL 0   montelukast  (SINGULAIR ) 10 MG tablet Take 1 tablet (10 mg total) by mouth daily. Start 1 days prior prior to treatment; Take for 3 days  (Patient not taking: Reported on 01/19/2024) 10 tablet 0   ondansetron  (ZOFRAN ) 8 MG tablet Take 1 tablet (8 mg total) by mouth every 8 (eight) hours as needed for nausea or vomiting. Start on the third day after chemotherapy. (Patient not taking: Reported on 01/19/2024) 30 tablet 1   ondansetron  (ZOFRAN -ODT) 4 MG disintegrating tablet Take 1 tablet (4 mg total) by mouth every 6 (six) hours as needed for nausea. (Patient not taking: Reported on 01/19/2024) 20 tablet 0   pantoprazole  (PROTONIX ) 40 MG tablet Take 1 tablet (40 mg total) by mouth 2 (two) times daily. Do not stop medication abruptly. (Patient not taking: Reported on 01/19/2024) 90 tablet 1   polyethylene glycol powder (GLYCOLAX /MIRALAX ) 17 GM/SCOOP powder Take 17 g by mouth daily. Dissolve 1 capful (17g) in 4-8 ounces of liquid and take by mouth daily. (Patient not taking: Reported on 01/19/2024)     predniSONE  (DELTASONE ) 20 MG tablet Take 5 tablets (100mg  total) by mouth daily with food. Do not stop until recommended. (Patient not taking: Reported on 01/19/2024) 150 tablet 1   prochlorperazine  (COMPAZINE ) 10 MG tablet Take 1 tablet (10 mg total) by mouth every 6 (six) hours as needed for nausea or vomiting. (Patient not taking: Reported on 01/19/2024) 30 tablet 1   topiramate (TOPAMAX) 25 MG tablet Take 25 mg by mouth daily. (Patient not taking: Reported on 01/19/2024)     No current facility-administered medications for this visit.    PHYSICAL EXAMINATION: Vitals:   01/19/24 1019  BP: 103/63  Pulse: 100  Resp: 16  Temp: 98.8 F (37.1 C)  SpO2: 100%   Filed Weights   01/19/24 1019  Weight: 202 lb (91.6 kg)    Physical Exam Vitals reviewed.  Constitutional:      Appearance: She is not ill-appearing.  HENT:     Head: Normocephalic and atraumatic.     Mouth/Throat:     Pharynx: No oropharyngeal exudate.  Eyes:     Pupils: Pupils are equal, round, and reactive to light.  Cardiovascular:     Rate and Rhythm: Normal rate  and regular rhythm.  Pulmonary:     Effort: Pulmonary effort is normal. No respiratory distress.     Breath sounds: Normal breath sounds. No wheezing.  Abdominal:     General: There is no distension.     Palpations: Abdomen is soft.     Tenderness: There is no abdominal tenderness. There is no guarding.  Musculoskeletal:        General: No tenderness. Normal range of motion.  Skin:    General: Skin is warm.     Coloration: Skin is not pale.  Neurological:  Mental Status: She is alert and oriented to person, place, and time.  Psychiatric:        Mood and Affect: Mood and affect normal.        Behavior: Behavior normal.    LABORATORY DATA:  I have reviewed the data as listed Lab Results  Component Value Date   WBC 7.0 01/19/2024   HGB 8.1 (L) 01/19/2024   HCT 23.9 (L) 01/19/2024   MCV 98.8 01/19/2024   PLT 68 (L) 01/19/2024   Recent Labs    07/20/23 1405 07/28/23 0935 08/05/23 0846 08/25/23 0826 01/07/24 1242 01/14/24 0946 01/14/24 1347 01/19/24 0954  NA  --   --   --    < > 140 141 140 139  K  --   --   --    < > 3.3* 4.2 4.0 4.2  CL  --   --   --    < > 104 105 106 102  CO2  --   --   --    < > 19* 25 26 23   GLUCOSE  --   --   --    < > 149* 86 147* 147*  BUN  --   --   --    < > 11 11 10 15   CREATININE  --   --   --    < > 0.61 0.61 0.57 0.68  CALCIUM   --   --   --    < > 8.9 9.4 8.7* 9.2  GFRNONAA  --   --   --    < > >60 >60 >60 >60  PROT 6.8 6.5 6.5   < > 6.5 6.7 6.2*  --   ALBUMIN 4.0 3.7 3.8   < > 4.2 4.3 4.0  --   AST 51* 25 18   < > 146* 114* 102*  --   ALT 70* 78* 17   < > 60* 55* 51*  --   ALKPHOS 71 95 70   < > 67 57 54  --   BILITOT 2.1* 1.5* 1.8*   < > 2.1* 1.6* 1.2  --   BILIDIR 0.4* 0.3* 0.3*  --   --   --   --   --   IBILI 1.7* 1.2* 1.5*  --   --   --   --   --    < > = values in this interval not displayed.   No results found.  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 G-4 anaphylactic reaction to Rituximab . Discontinued Tavvalisse in June secondary to elevated LFTs. AUG hide he denies use for refractory hemolytic anemia hemolytic anemia seen 2025-Discontinue Imuran .   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 Current therapy ineffective, necessitating chemotherapy. Discussed option of splenectomy versus chemotherapy.  Patient wants to hold off surgery-would recommend Cytoxan  single agent 750 mg/m times 1 infusion I also discussed the potential side effects including but not limited to-increasing fatigue, nausea vomiting, diarrhea, hair loss, sores in the mouth, increase risk of infection and also neuropathy.  Also reviewed the multiple strategies to avoid/mitigate similar side effects including preemptive medications-for nausea vomiting. Discussed risks of chemotherapy, including cytopenias and hospitalization.- Discontinued prednisone  due to lack of efficacy.   # S/p cytoxan  with udenyca  support on 12/18 and  12/19 respectively. Tolerated well. Hmg up to 8.1 today. LDH pending. Appears to have tolerated well.    # Symptomatic anemia- hx of antibodies. Previous reactions. Went to ER for transfusion last week. Tolerated well.   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    # Constipation- continue Dulcolax MiraLAX  and add; lactulose .   # DISPOSITION: Hold transfusion Follow up with Dr Rennie as scheduled with possible D2 blood- la  No problem-specific Assessment & Plan notes found for this encounter.  All questions were answered. The patient knows to call the clinic with any problems, questions or concerns.   Danielle KANDICE Dawn, NP 01/19/2024

## 2024-01-20 ENCOUNTER — Inpatient Hospital Stay

## 2024-01-20 ENCOUNTER — Telehealth: Payer: Self-pay | Admitting: Internal Medicine

## 2024-01-20 NOTE — Telephone Encounter (Signed)
 Called patient reviewed the labs-Will continue monitor.  GB

## 2024-01-25 ENCOUNTER — Inpatient Hospital Stay

## 2024-01-25 ENCOUNTER — Inpatient Hospital Stay: Admitting: Nurse Practitioner

## 2024-01-26 ENCOUNTER — Inpatient Hospital Stay: Admitting: Internal Medicine

## 2024-01-26 ENCOUNTER — Inpatient Hospital Stay

## 2024-01-27 ENCOUNTER — Inpatient Hospital Stay (HOSPITAL_BASED_OUTPATIENT_CLINIC_OR_DEPARTMENT_OTHER): Admitting: Internal Medicine

## 2024-01-27 ENCOUNTER — Inpatient Hospital Stay

## 2024-01-27 ENCOUNTER — Encounter: Payer: Self-pay | Admitting: Internal Medicine

## 2024-01-27 DIAGNOSIS — D649 Anemia, unspecified: Secondary | ICD-10-CM | POA: Diagnosis not present

## 2024-01-27 DIAGNOSIS — Z09 Encounter for follow-up examination after completed treatment for conditions other than malignant neoplasm: Secondary | ICD-10-CM

## 2024-01-27 DIAGNOSIS — D591 Autoimmune hemolytic anemia, unspecified: Secondary | ICD-10-CM

## 2024-01-27 DIAGNOSIS — Z5111 Encounter for antineoplastic chemotherapy: Secondary | ICD-10-CM | POA: Diagnosis not present

## 2024-01-27 LAB — CBC WITH DIFFERENTIAL/PLATELET
Abs Immature Granulocytes: 0.45 K/uL — ABNORMAL HIGH (ref 0.00–0.07)
Basophils Absolute: 0 K/uL (ref 0.0–0.1)
Basophils Relative: 0 %
Eosinophils Absolute: 0.1 K/uL (ref 0.0–0.5)
Eosinophils Relative: 1 %
HCT: 27.2 % — ABNORMAL LOW (ref 36.0–46.0)
Hemoglobin: 9.2 g/dL — ABNORMAL LOW (ref 12.0–15.0)
Immature Granulocytes: 6 %
Lymphocytes Relative: 18 %
Lymphs Abs: 1.4 K/uL (ref 0.7–4.0)
MCH: 36.1 pg — ABNORMAL HIGH (ref 26.0–34.0)
MCHC: 33.8 g/dL (ref 30.0–36.0)
MCV: 106.7 fL — ABNORMAL HIGH (ref 80.0–100.0)
Monocytes Absolute: 0.4 K/uL (ref 0.1–1.0)
Monocytes Relative: 4 %
Neutro Abs: 5.8 K/uL (ref 1.7–7.7)
Neutrophils Relative %: 71 %
Platelets: 178 K/uL (ref 150–400)
RBC: 2.55 MIL/uL — ABNORMAL LOW (ref 3.87–5.11)
Smear Review: NORMAL
WBC: 8.1 K/uL (ref 4.0–10.5)
nRBC: 1.5 % — ABNORMAL HIGH (ref 0.0–0.2)

## 2024-01-27 LAB — LACTATE DEHYDROGENASE: LDH: 764 U/L — ABNORMAL HIGH (ref 105–235)

## 2024-01-27 LAB — SAMPLE TO BLOOD BANK

## 2024-01-27 LAB — COMPREHENSIVE METABOLIC PANEL WITH GFR
ALT: 20 U/L (ref 0–44)
AST: 25 U/L (ref 15–41)
Albumin: 4.2 g/dL (ref 3.5–5.0)
Alkaline Phosphatase: 133 U/L — ABNORMAL HIGH (ref 38–126)
Anion gap: 12 (ref 5–15)
BUN: 14 mg/dL (ref 6–20)
CO2: 24 mmol/L (ref 22–32)
Calcium: 9.2 mg/dL (ref 8.9–10.3)
Chloride: 106 mmol/L (ref 98–111)
Creatinine, Ser: 0.6 mg/dL (ref 0.44–1.00)
GFR, Estimated: >60 mL/min
Glucose, Bld: 101 mg/dL — ABNORMAL HIGH (ref 70–99)
Potassium: 3.9 mmol/L (ref 3.5–5.1)
Sodium: 142 mmol/L (ref 135–145)
Total Bilirubin: 0.6 mg/dL (ref 0.0–1.2)
Total Protein: 6.6 g/dL (ref 6.5–8.1)

## 2024-01-27 NOTE — Assessment & Plan Note (Addendum)
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 G-4 anaphylactic reaction to Rituximab . Discontinued Tavvalisse in June secondary to elevated LFTs. 2025-Discontinue Imuran - lack of response; DISCONTINUED Prednisone  [DEC 2025] given lack of response.   # Status post Cytoxan  [01/14/24] -750 mg/m; x 1 with G-CSF support-significant improvement in the hemoglobin/LDH-today hemoglobin is 9.2.  Tolerated chemotherapy extremely well.  If concern for relapse-options include second cycle of Cytoxan /splenectomy.  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  # Vaccinations: Flu host [oct 2024].   # Contsipation-continue Dulcolax MiraLAX  and add; lactulose .  # DISPOSITION: # cancel blood tranfusion # in 2 week- labs- cbc; LDH # follow up  in 4  weeks-  -MD;  labs- cbc/bmp; MM panel; k/l light chians-HOLD tube- d-2 possible blood-Dr.B

## 2024-01-27 NOTE — Progress Notes (Signed)
 [facility] CONSULT NOTE  Patient Care Team: Donal Channing SQUIBB, FNP as PCP - General (Family Medicine) Rennie Cindy SAUNDERS, MD as Consulting Physician (Oncology)  CHIEF COMPLAINTS/PURPOSE OF CONSULTATION: ANEMIA  HEMATOLOGY HISTORY  # IRON  DEFICIENCY ANEMIA CHRONIC [since 2019] AUG 2021- hb-9; MCV- 63; Iron  sat- 3%; EGD > 7 years ago [[place]]; colonoscopy/capsule-NA; PO iron  constipates.   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  JAK2 testing negative.  However bone marrow biopsy- [MAY 2024-negative for any myelophthisis process; suggestive of hemolysis] HOLD off metformin; and detromethomorphan- Bupropion. PNH testing-NEGATIVE; complement levels.;  Hemoglobin cascade; G6PD levels-WNL.  April 2024 abdominal ultrasound showed mild splenomegaly.    # 11/07/2022-RELAPSE-start  Rituximab  X1 [OCT 11th, 2024- ]G-4-anaphylactic reaction needing EpiPen  transporting to the emergency room.-Discontinue further rituximab . JAN 1st week, 2025- .500 mg BID; mycophenolate -DC:  Imuran + tavasslise [mid March 2025]   # MARCH 2025- OFF prednisone  [ stopped/tapered OFF 10 mg a day] x2 months.  mycophenolate  500 mg twice daily-acute hemolytic anemia- complicated nstay in the hospital/ICU-anaphylactic reaction to IV Solu-Medrol -improved with EpiPen  although she tolerated Solu-Medrol  injection well earlier in the hospital stay.  Started on prednisone  60 to 80 mg a day- # Start Imuran  100 mg/day; # DC cellcept .   # September 2025-acute cholecystitis status postcholecystectomy.  #History of heavy menstrual cycles  HISTORY OF PRESENTING ILLNESS: with her mother.  Ambulating.  Danielle [name] 48 y.o.  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   Discussed the use of AI scribe software for clinical note transcription with the patient, who gave verbal consent to proceed.  History of Present Illness   Danielle [name] is a 48 year old female with hematologic malignancy status post chemotherapy who presents for follow-up of cytopenias and ongoing gastrointestinal symptoms.  She reports significant improvement in overall well-being since her last visit, with increased energy, good appetite, stable respiratory status, and absence of nausea, vomiting, or headaches. Laboratory results show hemoglobin increased from 8.1 to 9.2 and platelet count increased from 68 to 178. Chemotherapy has been completed and prednisone  was discontinued.  She continues to experience constipation with associated hematochezia. Miralax  has not provided relief, and she has not yet trialed prunes or Dulcolax. No new or worsening gastrointestinal symptoms are noted.  She expresses concern regarding hair care following chemotherapy, as she has avoided brushing or washing her hair due to fear of alopecia, resulting in a large knot. She remains optimistic about her recovery.     Danielle TOBLER is a 48 year old female with hematologic malignancy status post chemotherapy who presents for follow-up of cytopenias and ongoing gastrointestinal symptoms.  She reports significant improvement in overall well-being since her last visit, with increased energy, good appetite, stable respiratory status, and absence of nausea, vomiting, or headaches. Laboratory results show hemoglobin increased from 8.1 to 9.2 and platelet count increased from 68 to 178. Chemotherapy has been completed and prednisone  was discontinued.  She continues to experience constipation with associated hematochezia. Miralax  has not provided relief, and she has not yet trialed prunes or Dulcolax. No new or worsening gastrointestinal  symptoms are noted.  She expresses concern regarding hair care following chemotherapy, as she has avoided brushing or washing her hair due to fear of alopecia, resulting  in a large knot. She remains optimistic about her recovery.      Review of Systems  Constitutional:  Negative for chills, diaphoresis, fever and weight loss.  HENT:  Negative for nosebleeds and sore throat.   Eyes:  Negative for double vision.  Respiratory:  Negative for hemoptysis and sputum production.   Cardiovascular:  Negative for chest pain, palpitations and orthopnea.  Gastrointestinal:  Positive for blood in stool and constipation. Negative for abdominal pain, diarrhea, heartburn and melena.  Genitourinary:  Negative for dysuria, frequency and urgency.  Musculoskeletal:  Negative for back pain and joint pain.  Skin: Negative.  Negative for itching and rash.  Neurological:  Negative for focal weakness.  Endo/Heme/Allergies:  Does not bruise/bleed easily.  Psychiatric/Behavioral:  Negative for depression. The patient is not nervous/anxious.     MEDICAL HISTORY:  Past Medical History:  Diagnosis Date   Anemia    Depression    Hepatic steatosis 04/01/2023    SURGICAL HISTORY: Past Surgical History:  Procedure Laterality Date   ABDOMINAL HYSTERECTOMY     COLONOSCOPY WITH PROPOFOL  N/A 05/12/2022   Procedure: COLONOSCOPY WITH PROPOFOL ;  Surgeon: Jinny Carmine, MD;  Location: [facility] ENDOSCOPY;  Service: Endoscopy;  Laterality: N/A;   ESOPHAGOGASTRODUODENOSCOPY N/A 04/08/2023   Procedure: EGD (ESOPHAGOGASTRODUODENOSCOPY);  Surgeon: Toledo, Ladell POUR, MD;  Location: [facility] ENDOSCOPY;  Service: Gastroenterology;  Laterality: N/A;   ESOPHAGOGASTRODUODENOSCOPY (EGD) WITH PROPOFOL  N/A 05/12/2022   Procedure: ESOPHAGOGASTRODUODENOSCOPY (EGD) WITH PROPOFOL ;  Surgeon: Jinny Carmine, MD;  Location: [facility] ENDOSCOPY;  Service: Endoscopy;  Laterality: N/A;   IR BONE MARROW BIOPSY & ASPIRATION  05/28/2022   TONSILLECTOMY      SOCIAL  HISTORY: Social History   Socioeconomic History   Marital status: Single    Spouse name: Not on file   Number of children: Not on file   Years of education: Not on file   Highest education level: Not on file  Occupational History   Not on file  Tobacco Use   Smoking status: Former    Current packs/day: 0.00    Average packs/day: 0.5 packs/day for 25.0 years (12.5 ttl pk-yrs)    Types: Cigarettes    Start date: 05/09/1997    Quit date: 05/10/2022    Years since quitting: 1.7   Smokeless tobacco: Never  Vaping Use   Vaping status: Never Used  Substance and Sexual Activity   Alcohol use: Not Currently   Drug use: Never   Sexual activity: Not Currently  Other Topics Concern   Not on file  Social History Narrative   Lives in [place] with 4 kids; stay at home [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    Social Drivers of Health   Tobacco Use: Medium Risk (01/27/2024)   Patient History    Smoking Tobacco Use: Former    Smokeless Tobacco Use: Never    Passive Exposure: Not on Actuary Strain: Not on file  Food Insecurity: No Food Insecurity (10/23/2023)   Epic    Worried About Programme Researcher, Broadcasting/film/video in the Last Year: Never true    Ran Out of Food in the Last Year: Never true  Transportation Needs: No Transportation Needs (10/23/2023)   Epic    Lack of Transportation (Medical): No    Lack of Transportation (Non-Medical): No  Physical Activity: Not on file  Stress: Not on file  Social Connections: Unknown (04/01/2023)   Social Connection and Isolation Panel    Frequency of Communication with Friends and Family: More  than three times a week    Frequency of Social Gatherings with Friends and Family: More than three times a week    Attends Religious Services: More than 4 times per year    Active Member of Clubs or Organizations: No    Attends Banker Meetings: Never    Marital Status: Patient declined  Intimate Partner Violence: Not At Risk  (10/23/2023)   Epic    Fear of Current or Ex-Partner: No    Emotionally Abused: No    Physically Abused: No    Sexually Abused: No  Depression (PHQ2-9): Low Risk (01/27/2024)   Depression (PHQ2-9)    PHQ-2 Score: 3  Recent Concern: Depression (PHQ2-9) - Medium Risk (01/14/2024)   Depression (PHQ2-9)    PHQ-2 Score: 8  Alcohol Screen: Not on file  Housing: Low Risk (10/23/2023)   Epic    Unable to Pay for Housing in the Last Year: No    Number of Times Moved in the Last Year: 0    Homeless in the Last Year: No  Utilities: Not At Risk (10/23/2023)   Epic    Threatened with loss of utilities: No  Health Literacy: Not on file    FAMILY HISTORY: Family History  Problem Relation Age of Onset   Breast cancer Paternal Aunt     ALLERGIES:  is allergic to dilaudid  [hydromorphone  hcl], rituxan  [rituximab ], and solu-medrol  [methylprednisolone ].  MEDICATIONS:  Current Outpatient Medications  Medication Sig Dispense Refill   acetaminophen  (TYLENOL ) 500 MG tablet Take 500 mg by mouth every 6 (six) hours as needed.     calcium  carbonate (TUMS) 500 MG chewable tablet Chew 1 tablet (200 mg of elemental calcium  total) by mouth 3 (three) times daily. 90 tablet 2   folic acid  (FOLVITE ) 1 MG tablet Take 1 tablet (1 mg total) by mouth daily. 90 tablet 1   ondansetron  (ZOFRAN ) 8 MG tablet Take 1 tablet (8 mg total) by mouth every 8 (eight) hours as needed for nausea or vomiting. Start on the third day after chemotherapy. 30 tablet 1   ondansetron  (ZOFRAN -ODT) 4 MG disintegrating tablet Take 1 tablet (4 mg total) by mouth every 6 (six) hours as needed for nausea. 20 tablet 0   pantoprazole  (PROTONIX ) 40 MG tablet Take 1 tablet (40 mg total) by mouth 2 (two) times daily. Do not stop medication abruptly. 90 tablet 1   polyethylene glycol powder (GLYCOLAX /MIRALAX ) 17 GM/SCOOP powder Take 17 g by mouth daily. Dissolve 1 capful (17g) in 4-8 ounces of liquid and take by mouth daily.     prochlorperazine   (COMPAZINE ) 10 MG tablet Take 1 tablet (10 mg total) by mouth every 6 (six) hours as needed for nausea or vomiting. 30 tablet 1   topiramate (TOPAMAX) 25 MG tablet Take 25 mg by mouth daily.     No current facility-administered medications for this visit.      PHYSICAL EXAMINATION:   Vitals:   01/27/24 0832  BP: (!) 106/37  Pulse: 74  Resp: 16  Temp: (!) 97.4 F (36.3 C)  SpO2: 100%          Filed Weights   01/27/24 0832  Weight: 201 lb 1.6 oz (91.2 kg)         Physical Exam HENT:     Head: Normocephalic and atraumatic.     Mouth/Throat:     Pharynx: No oropharyngeal exudate.  Eyes:     Pupils: Pupils are equal, round, and reactive to light.  Cardiovascular:  Rate and Rhythm: Normal rate and regular rhythm.  Pulmonary:     Effort: Pulmonary effort is normal. No respiratory distress.     Breath sounds: Normal breath sounds. No wheezing.  Abdominal:     General: Bowel sounds are normal. There is no distension.     Palpations: Abdomen is soft. There is no mass.     Tenderness: There is no abdominal tenderness. There is no guarding or rebound.  Musculoskeletal:        General: No tenderness. Normal range of motion.     Cervical back: Normal range of motion and neck supple.  Skin:    General: Skin is warm.  Neurological:     Mental Status: She is alert and oriented to person, place, and time.  Psychiatric:        Mood and Affect: Affect normal.     LABORATORY DATA:  I have reviewed the data as listed Lab Results  Component Value Date   WBC 7.0 01/19/2024   HGB 8.1 (L) 01/19/2024   HCT 23.9 (L) 01/19/2024   MCV 98.8 01/19/2024   PLT 68 (L) 01/19/2024   Recent Labs    07/20/23 1405 07/28/23 0935 08/05/23 0846 08/25/23 0826 01/14/24 0946 01/14/24 1347 01/19/24 0954 01/27/24 0838  NA  --   --   --    < > 141 140 139 142  K  --   --   --    < > 4.2 4.0 4.2 3.9  CL  --   --   --    < > 105 106 102 106  CO2  --   --   --    < > 25 26 23  24   GLUCOSE  --   --   --    < > 86 147* 147* 101*  BUN  --   --   --    < > 11 10 15 14   CREATININE  --   --   --    < > 0.61 0.57 0.68 0.60  CALCIUM   --   --   --    < > 9.4 8.7* 9.2 9.2  GFRNONAA  --   --   --    < > >60 >60 >60 >60  PROT 6.8 6.5 6.5   < > 6.7 6.2*  --  6.6  ALBUMIN 4.0 3.7 3.8   < > 4.3 4.0  --  4.2  AST 51* 25 18   < > 114* 102*  --  25  ALT 70* 78* 17   < > 55* 51*  --  20  ALKPHOS 71 95 70   < > 57 54  --  133*  BILITOT 2.1* 1.5* 1.8*   < > 1.6* 1.2  --  0.6  BILIDIR 0.4* 0.3* 0.3*  --   --   --   --   --   IBILI 1.7* 1.2* 1.5*  --   --   --   --   --    < > = values in this interval not displayed.     No results found.    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 G-4 anaphylactic reaction to Rituximab . Discontinued Tavvalisse in June secondary to elevated LFTs. 2025-Discontinue Imuran - lack of response; DISCONTINUED Prednisone  [DEC 2025] given lack of response.   # Status post Cytoxan  [01/14/24] -750 mg/m;  x 1 with G-CSF support-significant improvement in the hemoglobin/LDH-today hemoglobin is 9.2.  Tolerated chemotherapy extremely well.  If concern for relapse-options include second cycle of Cytoxan /splenectomy.  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  # Vaccinations: Flu host [oct 2024].   # Contsipation-continue Dulcolax MiraLAX  and add; lactulose .  # DISPOSITION: # cancel blood tranfusion # in 2 week- labs- cbc; LDH # follow up  in 4  weeks-  -MD;  labs- cbc/bmp; MM panel; k/l light chians-HOLD tube- d-2 possible blood-Dr.B  All questions were answered. The patient knows to call the clinic with any problems, questions or concerns.    Cindy JONELLE Joe, MD 01/27/2024 9:30 AM

## 2024-01-27 NOTE — Progress Notes (Signed)
 Fatigue/weakness: YES/BETTER Dyspena: NO  Light headedness: NO  Blood in stool: YES/CONSTIPATION/BLOOD BRIGHT RED

## 2024-01-29 ENCOUNTER — Inpatient Hospital Stay

## 2024-02-08 ENCOUNTER — Encounter: Payer: Self-pay | Admitting: *Deleted

## 2024-02-10 ENCOUNTER — Encounter: Payer: Self-pay | Admitting: Internal Medicine

## 2024-02-10 ENCOUNTER — Inpatient Hospital Stay: Attending: Internal Medicine

## 2024-02-12 ENCOUNTER — Inpatient Hospital Stay: Attending: Internal Medicine

## 2024-02-22 ENCOUNTER — Encounter: Payer: Self-pay | Admitting: Internal Medicine

## 2024-02-23 ENCOUNTER — Other Ambulatory Visit: Payer: Self-pay

## 2024-02-23 ENCOUNTER — Other Ambulatory Visit: Payer: Self-pay | Admitting: *Deleted

## 2024-02-23 ENCOUNTER — Inpatient Hospital Stay

## 2024-02-23 ENCOUNTER — Inpatient Hospital Stay: Admitting: Internal Medicine

## 2024-02-23 ENCOUNTER — Ambulatory Visit: Payer: Self-pay | Admitting: Internal Medicine

## 2024-02-23 DIAGNOSIS — D649 Anemia, unspecified: Secondary | ICD-10-CM

## 2024-02-23 DIAGNOSIS — D591 Autoimmune hemolytic anemia, unspecified: Secondary | ICD-10-CM

## 2024-02-23 LAB — CBC WITH DIFFERENTIAL (CANCER CENTER ONLY)
Abs Immature Granulocytes: 0 10*3/uL (ref 0.00–0.07)
Basophils Absolute: 0 10*3/uL (ref 0.0–0.1)
Basophils Relative: 0 %
Eosinophils Absolute: 0.1 10*3/uL (ref 0.0–0.5)
Eosinophils Relative: 3 %
HCT: 27.9 % — ABNORMAL LOW (ref 36.0–46.0)
Hemoglobin: 10 g/dL — ABNORMAL LOW (ref 12.0–15.0)
Immature Granulocytes: 0 %
Lymphocytes Relative: 28 %
Lymphs Abs: 0.9 10*3/uL (ref 0.7–4.0)
MCH: 41 pg — ABNORMAL HIGH (ref 26.0–34.0)
MCHC: 35.8 g/dL (ref 30.0–36.0)
MCV: 114.3 fL — ABNORMAL HIGH (ref 80.0–100.0)
Monocytes Absolute: 0.1 10*3/uL (ref 0.1–1.0)
Monocytes Relative: 4 %
Neutro Abs: 2.1 10*3/uL (ref 1.7–7.7)
Neutrophils Relative %: 65 %
Platelet Count: 156 10*3/uL (ref 150–400)
RBC: 2.44 MIL/uL — ABNORMAL LOW (ref 3.87–5.11)
Smear Review: NORMAL
WBC Count: 3.3 10*3/uL — ABNORMAL LOW (ref 4.0–10.5)
nRBC: 0 % (ref 0.0–0.2)

## 2024-02-23 LAB — RETIC PANEL
Immature Retic Fract: 19.5 % — ABNORMAL HIGH (ref 2.3–15.9)
RBC.: 2.43 MIL/uL — ABNORMAL LOW (ref 3.87–5.11)
Retic Count, Absolute: 106.9 10*3/uL (ref 19.0–186.0)
Retic Ct Pct: 4.4 % — ABNORMAL HIGH (ref 0.4–3.1)
Reticulocyte Hemoglobin: 46.1 pg

## 2024-02-23 LAB — BASIC METABOLIC PANEL - CANCER CENTER ONLY
Anion gap: 10 (ref 5–15)
BUN: 14 mg/dL (ref 6–20)
CO2: 25 mmol/L (ref 22–32)
Calcium: 9.3 mg/dL (ref 8.9–10.3)
Chloride: 105 mmol/L (ref 98–111)
Creatinine: 0.55 mg/dL (ref 0.44–1.00)
GFR, Estimated: >60 mL/min
Glucose, Bld: 128 mg/dL — ABNORMAL HIGH (ref 70–99)
Potassium: 4 mmol/L (ref 3.5–5.1)
Sodium: 141 mmol/L (ref 135–145)

## 2024-02-23 LAB — SAMPLE TO BLOOD BANK

## 2024-02-23 LAB — LACTATE DEHYDROGENASE: LDH: 512 U/L — ABNORMAL HIGH (ref 105–235)

## 2024-02-24 ENCOUNTER — Inpatient Hospital Stay

## 2024-02-24 LAB — KAPPA/LAMBDA LIGHT CHAINS
Kappa free light chain: 17.2 mg/L (ref 3.3–19.4)
Kappa, lambda light chain ratio: 0.19 — ABNORMAL LOW (ref 0.26–1.65)
Lambda free light chains: 89.3 mg/L — ABNORMAL HIGH (ref 5.7–26.3)

## 2024-02-25 LAB — MULTIPLE MYELOMA PANEL, SERUM
Albumin SerPl Elph-Mcnc: 4 g/dL (ref 2.9–4.4)
Albumin/Glob SerPl: 1.7 (ref 0.7–1.7)
Alpha 1: 0.2 g/dL (ref 0.0–0.4)
Alpha2 Glob SerPl Elph-Mcnc: 0.4 g/dL (ref 0.4–1.0)
B-Globulin SerPl Elph-Mcnc: 1.2 g/dL (ref 0.7–1.3)
Gamma Glob SerPl Elph-Mcnc: 0.7 g/dL (ref 0.4–1.8)
Globulin, Total: 2.5 g/dL (ref 2.2–3.9)
IgA: 578 mg/dL — ABNORMAL HIGH (ref 87–352)
IgG (Immunoglobin G), Serum: 832 mg/dL (ref 586–1602)
IgM (Immunoglobulin M), Srm: 58 mg/dL (ref 26–217)
M Protein SerPl Elph-Mcnc: 0.3 g/dL — ABNORMAL HIGH
Total Protein ELP: 6.5 g/dL (ref 6.0–8.5)

## 2024-02-29 ENCOUNTER — Inpatient Hospital Stay

## 2024-03-01 ENCOUNTER — Inpatient Hospital Stay

## 2024-03-08 ENCOUNTER — Inpatient Hospital Stay: Attending: Internal Medicine

## 2024-03-08 ENCOUNTER — Inpatient Hospital Stay: Admitting: Internal Medicine

## 2024-03-09 ENCOUNTER — Inpatient Hospital Stay
